# Patient Record
Sex: Male | Born: 1935
Health system: Southern US, Community
[De-identification: ages and names within clinical notes are randomized; demographics above are authoritative.]

## PROBLEM LIST (undated history)

## (undated) DIAGNOSIS — I2510 Atherosclerotic heart disease of native coronary artery without angina pectoris: Secondary | ICD-10-CM

## (undated) DIAGNOSIS — I509 Heart failure, unspecified: Secondary | ICD-10-CM

## (undated) DIAGNOSIS — N400 Enlarged prostate without lower urinary tract symptoms: Secondary | ICD-10-CM

## (undated) DIAGNOSIS — G4730 Sleep apnea, unspecified: Secondary | ICD-10-CM

## (undated) DIAGNOSIS — G529 Cranial nerve disorder, unspecified: Secondary | ICD-10-CM

## (undated) DIAGNOSIS — D649 Anemia, unspecified: Secondary | ICD-10-CM

## (undated) DIAGNOSIS — M1990 Unspecified osteoarthritis, unspecified site: Secondary | ICD-10-CM

## (undated) DIAGNOSIS — D696 Thrombocytopenia, unspecified: Secondary | ICD-10-CM

## (undated) DIAGNOSIS — E7800 Pure hypercholesterolemia, unspecified: Secondary | ICD-10-CM

## (undated) DIAGNOSIS — Z9289 Personal history of other medical treatment: Secondary | ICD-10-CM

## (undated) DIAGNOSIS — G629 Polyneuropathy, unspecified: Secondary | ICD-10-CM

## (undated) DIAGNOSIS — E669 Obesity, unspecified: Secondary | ICD-10-CM

## (undated) DIAGNOSIS — I10 Essential (primary) hypertension: Secondary | ICD-10-CM

## (undated) DIAGNOSIS — R0602 Shortness of breath: Secondary | ICD-10-CM

## (undated) DIAGNOSIS — K219 Gastro-esophageal reflux disease without esophagitis: Secondary | ICD-10-CM

## (undated) DIAGNOSIS — F419 Anxiety disorder, unspecified: Secondary | ICD-10-CM

## (undated) DIAGNOSIS — M159 Polyosteoarthritis, unspecified: Secondary | ICD-10-CM

## (undated) DIAGNOSIS — C801 Malignant (primary) neoplasm, unspecified: Secondary | ICD-10-CM

## (undated) HISTORY — DX: Essential (primary) hypertension: I10

## (undated) HISTORY — DX: Polyosteoarthritis, unspecified: M15.9

## (undated) HISTORY — DX: Polyneuropathy, unspecified: G62.9

## (undated) HISTORY — DX: Gastro-esophageal reflux disease without esophagitis: K21.9

## (undated) HISTORY — DX: Atherosclerotic heart disease of native coronary artery without angina pectoris: I25.10

## (undated) HISTORY — DX: Anemia, unspecified: D64.9

## (undated) HISTORY — PX: OTHER SURGICAL HISTORY: SHX169

## (undated) HISTORY — DX: Pure hypercholesterolemia, unspecified: E78.00

## (undated) HISTORY — DX: Sleep apnea, unspecified: G47.30

## (undated) HISTORY — PX: ARTERY BIOPSY: SHX891

## (undated) HISTORY — PX: TOTAL HIP ARTHROPLASTY: SHX124

## (undated) HISTORY — DX: Unspecified osteoarthritis, unspecified site: M19.90

## (undated) HISTORY — DX: Obesity, unspecified: E66.9

## (undated) HISTORY — DX: Benign prostatic hyperplasia without lower urinary tract symptoms: N40.0

## (undated) HISTORY — PX: HERNIA REPAIR: SHX51

## (undated) HISTORY — DX: Cranial nerve disorder, unspecified: G52.9

## (undated) HISTORY — DX: Shortness of breath: R06.02

## (undated) HISTORY — PX: JOINT REPLACEMENT: SHX530

## (undated) HISTORY — DX: Thrombocytopenia, unspecified: D69.6

## (undated) HISTORY — PX: APPENDECTOMY: SHX54

---

## 1998-06-26 ENCOUNTER — Ambulatory Visit (HOSPITAL_COMMUNITY): Admission: RE | Admit: 1998-06-26 | Discharge: 1998-06-26 | Payer: Self-pay | Admitting: Gastroenterology

## 1998-07-02 ENCOUNTER — Ambulatory Visit (HOSPITAL_COMMUNITY): Admission: RE | Admit: 1998-07-02 | Discharge: 1998-07-02 | Payer: Self-pay | Admitting: Cardiology

## 2002-10-14 ENCOUNTER — Ambulatory Visit (HOSPITAL_BASED_OUTPATIENT_CLINIC_OR_DEPARTMENT_OTHER): Admission: RE | Admit: 2002-10-14 | Discharge: 2002-10-14 | Payer: Self-pay | Admitting: Internal Medicine

## 2003-06-07 ENCOUNTER — Encounter: Payer: Self-pay | Admitting: Cardiology

## 2003-06-07 ENCOUNTER — Encounter: Admission: RE | Admit: 2003-06-07 | Discharge: 2003-06-07 | Payer: Self-pay | Admitting: Cardiology

## 2005-10-01 ENCOUNTER — Encounter: Admission: RE | Admit: 2005-10-01 | Discharge: 2005-10-01 | Payer: Self-pay | Admitting: Internal Medicine

## 2005-12-06 HISTORY — PX: CORONARY ARTERY BYPASS GRAFT: SHX141

## 2005-12-15 ENCOUNTER — Ambulatory Visit (HOSPITAL_COMMUNITY): Admission: RE | Admit: 2005-12-15 | Discharge: 2005-12-15 | Payer: Self-pay | Admitting: Cardiology

## 2005-12-15 HISTORY — PX: CARDIAC CATHETERIZATION: SHX172

## 2005-12-22 ENCOUNTER — Inpatient Hospital Stay (HOSPITAL_COMMUNITY): Admission: RE | Admit: 2005-12-22 | Discharge: 2005-12-27 | Payer: Self-pay | Admitting: Cardiothoracic Surgery

## 2006-01-20 ENCOUNTER — Encounter (HOSPITAL_COMMUNITY): Admission: RE | Admit: 2006-01-20 | Discharge: 2006-04-20 | Payer: Self-pay | Admitting: Cardiology

## 2006-06-20 IMAGING — CR DG CHEST 2V
2 series · 2 of 2 positions shown · non-contrast
Comparison: none

CLINICAL DATA: CABG.  Hypertension. 
 CHEST ? 2 VIEW:

[view not recorded (1 of 2)]
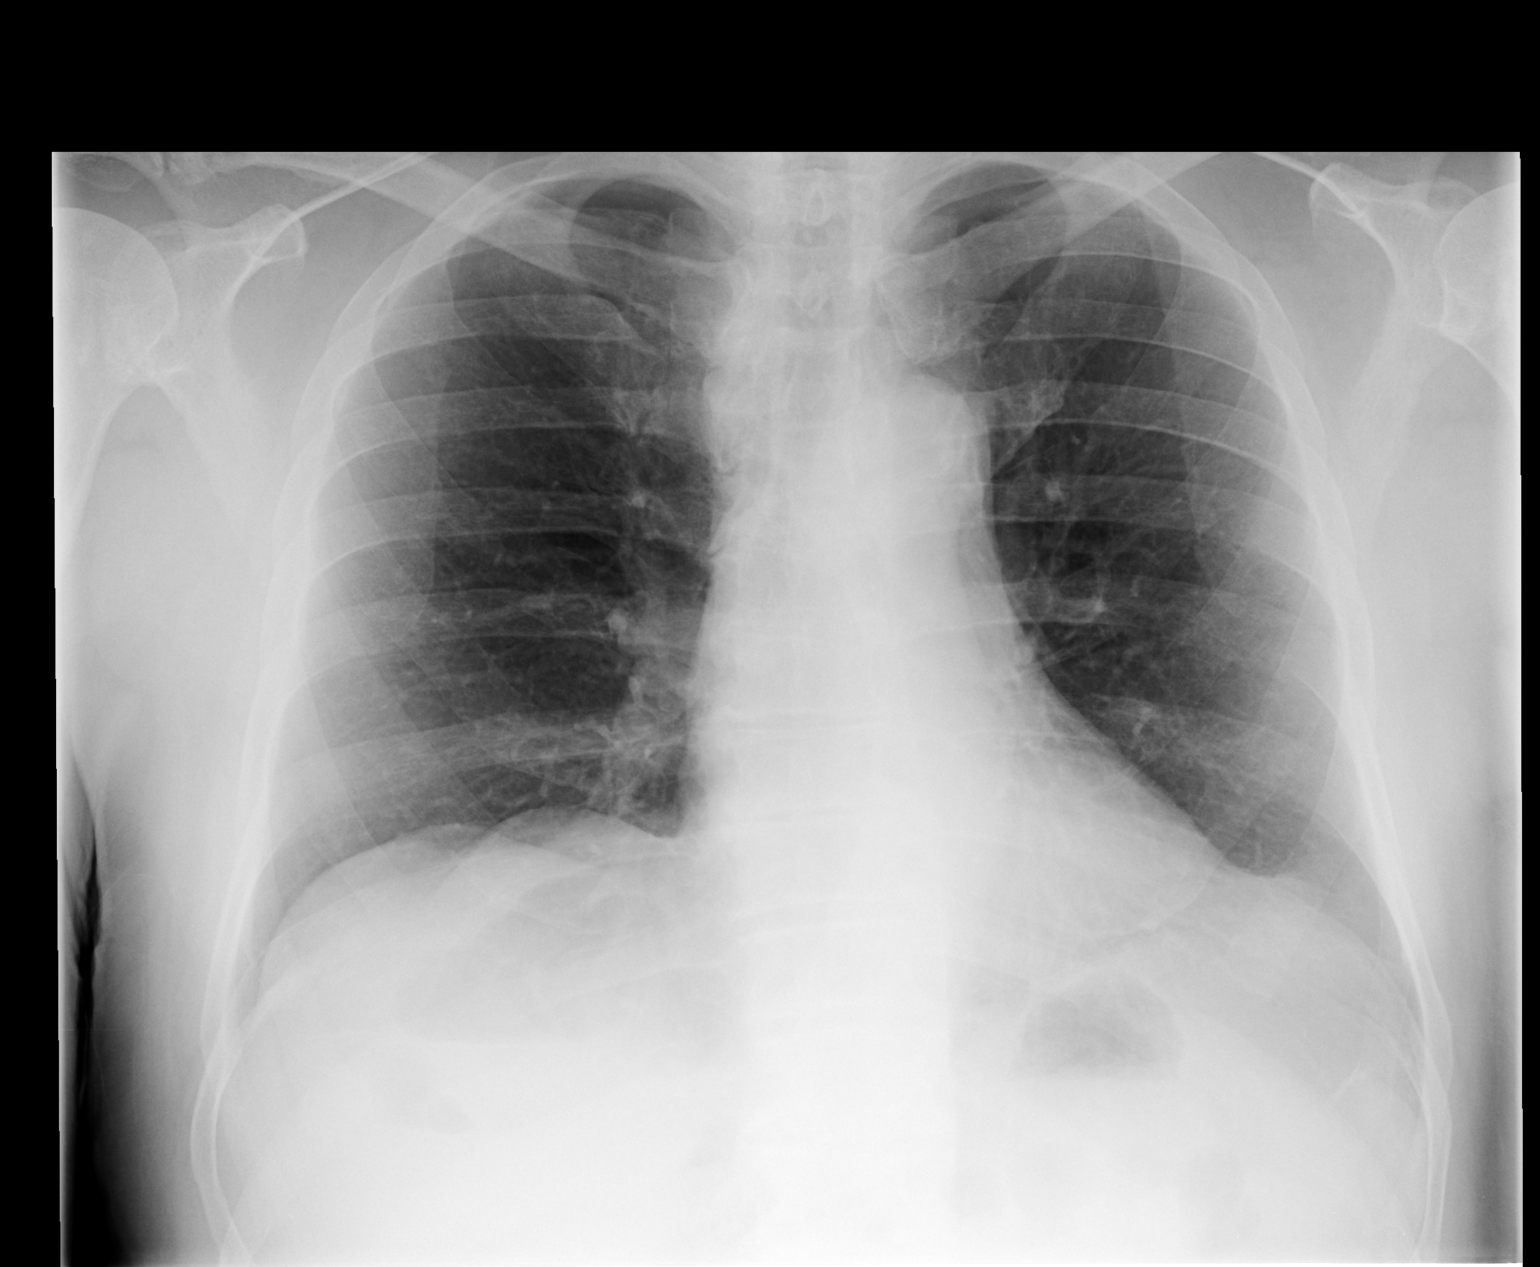

[view not recorded (2 of 2)]
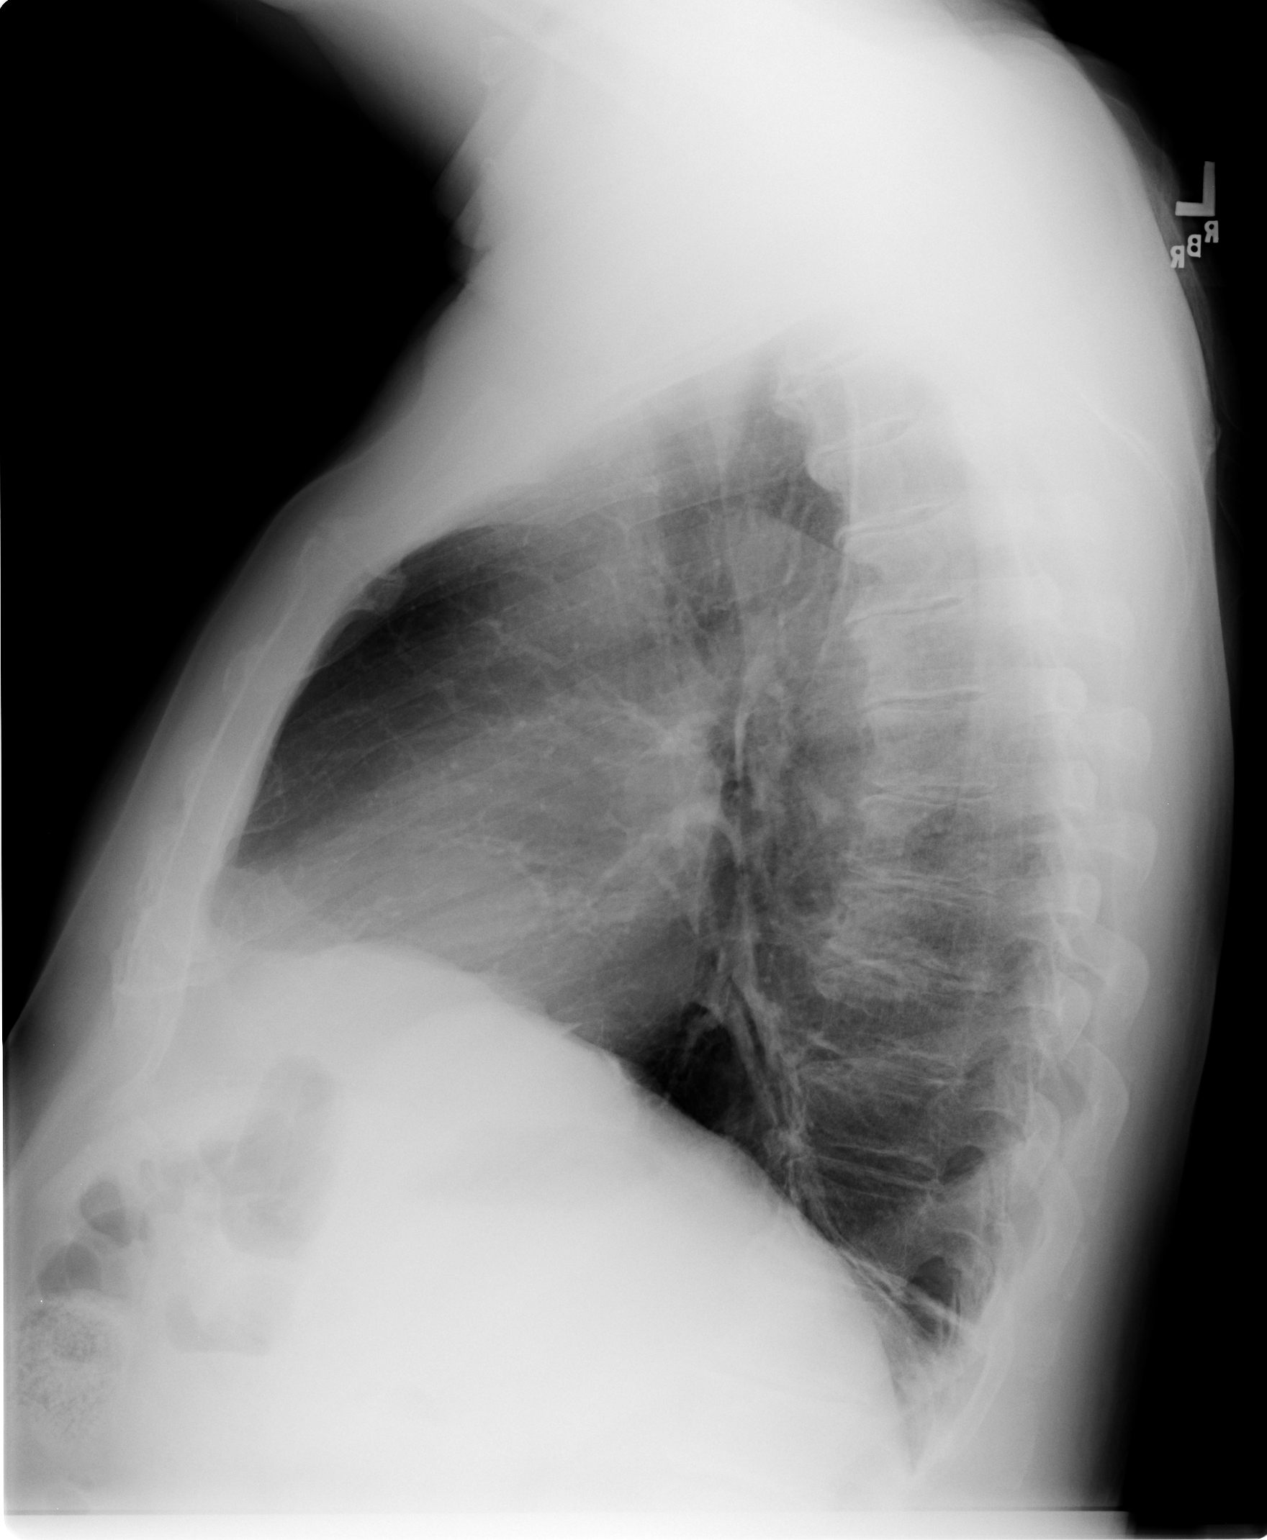

[2 of 2 positions shown; findings below may reference images not displayed]

FINDINGS: Two views of the chest show the lungs to be clear.  The heart is within normal limits of normal.  The linear scarring or atelectasis is noted posteriorly at the lung base.  There are degenerative changes throughout the thoracic spine.
IMPRESSION: No active lung disease.

## 2006-06-22 IMAGING — CR DG CHEST 1V PORT
1 series · 1 of 1 positions shown · non-contrast
Comparison: 12/20/05.

CLINICAL DATA: Post CABG.  Chest tube placement.  
 PORTABLE CHEST - 1 VIEW 12/22/05:

[view not recorded]
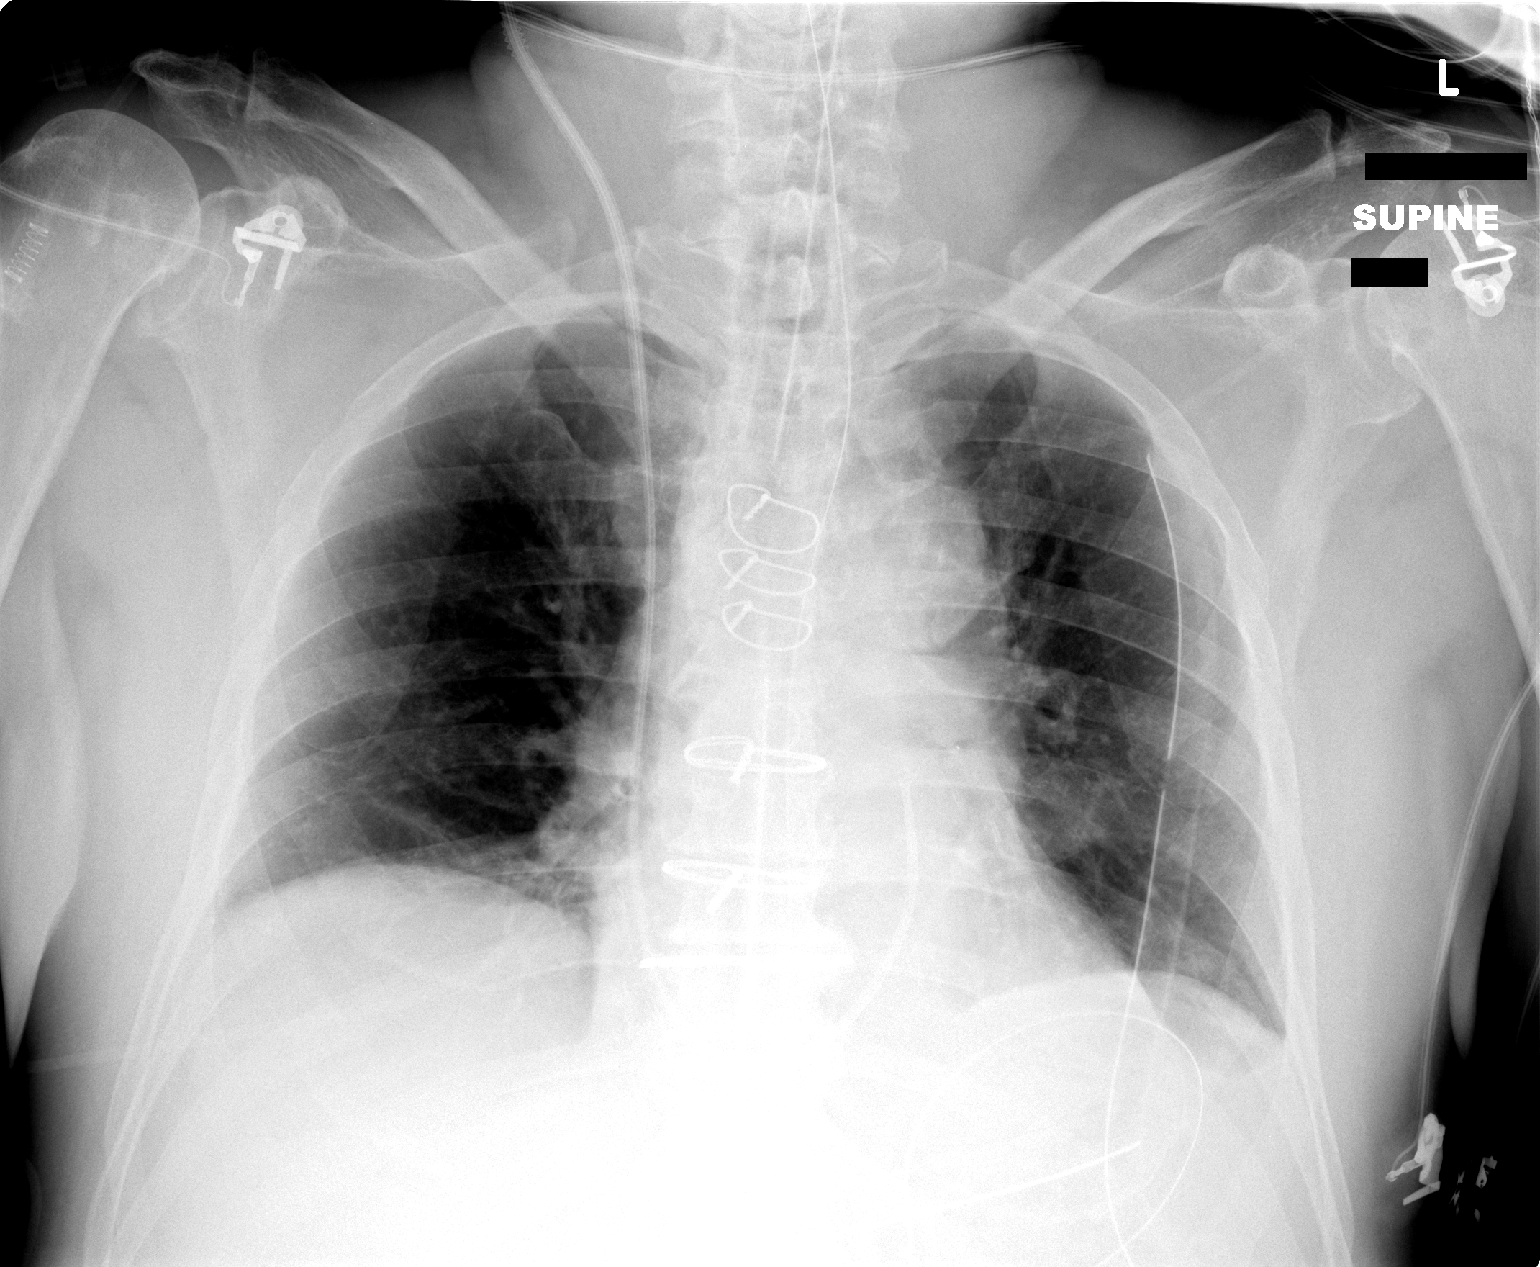

[1 of 1 positions shown; findings below may reference images not displayed]

FINDINGS: Endotracheal tube is in satisfactory position.  Nasogastric tube is followed into the stomach.  Right IJ Swan Ganz catheter terminates in the main pulmonary artery.  Mediastinal drain and left chest tube are in place. 
 No pneumothorax.  Lungs are low in volume with bibasilar subsegmental atelectasis.  No edema.
IMPRESSION: 1.  No pneumothorax with left chest tube in place. 
 2.  Low volume with bibasilar atelectasis.

## 2006-10-10 ENCOUNTER — Ambulatory Visit: Payer: Self-pay | Admitting: Internal Medicine

## 2006-12-07 ENCOUNTER — Inpatient Hospital Stay (HOSPITAL_COMMUNITY): Admission: RE | Admit: 2006-12-07 | Discharge: 2006-12-11 | Payer: Self-pay | Admitting: Orthopedic Surgery

## 2008-01-06 ENCOUNTER — Encounter: Payer: Self-pay | Admitting: Internal Medicine

## 2008-01-06 DIAGNOSIS — G4733 Obstructive sleep apnea (adult) (pediatric): Secondary | ICD-10-CM | POA: Insufficient documentation

## 2008-01-06 DIAGNOSIS — G4700 Insomnia, unspecified: Secondary | ICD-10-CM | POA: Insufficient documentation

## 2008-01-06 DIAGNOSIS — I2510 Atherosclerotic heart disease of native coronary artery without angina pectoris: Secondary | ICD-10-CM | POA: Insufficient documentation

## 2008-01-06 DIAGNOSIS — I10 Essential (primary) hypertension: Secondary | ICD-10-CM | POA: Insufficient documentation

## 2008-01-07 ENCOUNTER — Encounter: Admission: RE | Admit: 2008-01-07 | Discharge: 2008-01-07 | Payer: Self-pay | Admitting: Cardiology

## 2009-08-05 ENCOUNTER — Encounter: Payer: Self-pay | Admitting: Internal Medicine

## 2010-10-22 ENCOUNTER — Ambulatory Visit: Payer: Self-pay | Admitting: Cardiology

## 2011-04-16 ENCOUNTER — Other Ambulatory Visit: Payer: Self-pay | Admitting: Internal Medicine

## 2011-04-16 NOTE — Telephone Encounter (Signed)
Please advise if okay to refill. 

## 2011-04-19 NOTE — Telephone Encounter (Signed)
OK to refill halcion till next appointment or total 6 months, whichever is shorter.

## 2011-04-23 NOTE — Assessment & Plan Note (Signed)
Scott [name]                               PULMONARY OFFICE NOTE   Scott [name], Scott [name]                      MRN:          [id number]  DATE:10/10/2006                            DOB:          [birth year]    PULMONARY/SLEEP FOLLOWUP:   PROBLEMS:  1. Obstructive sleep apnea with insomnia.  2. Probable additional primary disorder of insomnia.  3. Hypertension.  4. Coronary disease/bypass graft.  5. Hip pain.   HISTORY:  Scott [name] was last seen at [name] in January,  2005.  He continues using CPAP but says he will decide kind of arbitrarily  to wear it some nights and not to wear it other nights.  Wife says he snores  loudly when he does not wear it.  He says he is just a restless sleeper,  kicking and moving around in the bed, and it gets in his way.  It sounds as  if he may have restless legs with the primary component being sleep-related  leg jerks.  He does not have a lot of daytime symptoms.  He has been having  some right hip and knee pain and will be seeing Scott [name] about this.  He  is not sure if some of the pain is related to vein graft removal from his  leg for use with coronary artery bypass graft surgery.  What he really wants  is a refill of triazolam, which he uses p.r.n.  He says if he stays on it  for 10-12 days at a time, he begins to note some decrease in his memory, so  he will come off it periodically.  We discussed this medication,  alternatives, and sleep hygiene carefully.  He does notice some nasal  stuffiness and dry mouth, although he uses a humidifier attachment with the  CPAP.  He gets up at night to walk around so he can relax his legs.  I  cannot tell quite how fragmented his sleep is.   MEDICATIONS:  1. Proscar 5 mg.  2. Flomax 5 mg.  3. Maxzide 1/2.  4. Mevacor.  5. Norvasc 2.5 mg.  6. CPAP 7 CWP.  7. Flonase.  8. Triazolam 1/2 to 1 time 0.25 mg.  9. Viagra.  10.Nasal saline spray.   Drug intolerant of CODEINE and PREDNISONE.   A sleep study in 2003 had shown mild obstructive apnea with an index of 15.9  per hour, moderate snoring.  Oxygen saturation desaturation to 82%, and CPAP  titration to 8 CWP.  He had been tried with clonazepam, Prosom, and I think  some other sleep meds before settling on triazolam.  He had seen Scott [name] in 2003 for acute sinusitis, nasal septal deviation, and his sleep  apnea.   OBJECTIVE:  VITAL SIGNS:  Weight 186 pounds.  BP 122/66.  Pulse regular at  85.  Room air saturation 95%.  GENERAL:  He is an alert, fully oriented man with minimal nasal congestion.  HEART:  Heart sounds are regular without murmur.  LUNGS:  Lung sounds are clear.  HEENT:  Voice  quality is normal with no stridor.  He is not obviously  restless during our discussion.   He has had a longstanding insomnia complaint for which we re-emphasized good  sleep hygiene and available tools.  I discussed potential relation to his  obstructive sleep apnea and to his restless legs syndrome as well as the hip  pain for which he is going to be seeing Scott [name].   PLAN:  1. Refill triazolam 0.25 mg nightly p.r.n. occasional use.  2. Samples of Lunesta 3 mg to use as an alternative, 1 nightly p.r.n.  3. Samples of Requip 0.25 mg for trial for restless legs.  4. Nasal saline gel.  5. Hopefully with medication changes above, he will be better able to      sleep and keep his CPAP on.  6. Return in four months, earlier p.r.n.     Scott D. Maple Hudson, MD, Scott [name], FACP  Electronically Signed    CDY/MedQ  DD: 10/10/2006  DT: 10/11/2006  Job #: [id number]   cc:   Scott [name], M.D.  Scott [name], M.D.  Scott [name], M.D.

## 2011-04-23 NOTE — Cardiovascular Report (Signed)
NAME:  Scott, [name] NO.:  [id number]   MEDICAL RECORD NO.:  [id number]          PATIENT TYPE:  OIB   LOCATION:  2854                         FACILITY:  [facility]   PHYSICIAN:  Colleen Can. Deborah Chalk, M.D.DATE OF BIRTH:  [birth year]   DATE OF PROCEDURE:  12/15/2005  DATE OF DISCHARGE:  12/15/2005                              CARDIAC CATHETERIZATION   HISTORY:  Scott [name] is a 77 year old male with a long history of ischemic  heart disease who had an abnormal exercise stress test on November 22, 2005.  He was referred for follow-up study.   PROCEDURE:  Left heart catheterization with selective coronary angiography,  left ventricular angiography.   TYPE AND SITE OF ENTRY:  Percutaneous right femoral artery with AngioSeal.   CATHETERS:  A 6-French 4 curved Judkins right and left coronary  catheterization, 6-French pigtail ventriculographic catheter.   CONTRAST MATERIAL:  Omnipaque.   MEDICATIONS:  Given prior procedure, Benadryl and Pepcid as well as Valium  10 milligrams.   MEDICATIONS:  Given during the procedure, Versed 4 milligrams IV.   COMMENT:  The patient tolerated the procedure well.   ANGIOGRAPHIC DATA:  1.  Left main coronary artery had 60-70% distal narrowing.  2.  Left circumflex:  Left circumflex continues primarily as a large obtuse      marginal system. There is 50-70% narrowing at the ostium of the left      circumflex. The left circumflex is highly calcified. There is a large      obtuse marginal vessel extending pressure lateral wall.  3.  Left anterior descending:  The left anterior descending is a reasonably      large vessel that crosses the apex. There is a large septal perforating      branch proximally. The vessel was heavily calcified. At the level of the      largest of the diagonal vessels, there appears to be a 60-70% stenosis      in the mid-section. In RAO cranial views, the left anterior descending      is heavily calcified. There  is collaterals to a distal posterior lateral      branch.  4.  Right coronary artery:  Right coronary artery is a small heavily      calcified and diffusely diseased dominant vessel. There is segmental 70-      90% stenosis throughout the proximal right coronary artery. There is a      posterior lateral branch that is patent and would be suitable for bypass      grafting. There are two small vessels that arise and go to the posterior      portion of the inferior wall. There is a vessel that appears to be the      origin of the posterior descending that is totally occluded at the crux.      The right coronary artery is diffusely diseased distally but would      appear to probably be suitable for bypass grafting. It may be possible      to graft into the body of the right or into some  of the smaller LV      branches.   The left ventricular angiogram performed in RAO position. Overall cardiac  size and silhouette are normal. The global ejection fraction estimated be  60%. Regional wall motion appears to be normal.   OVERALL IMPRESSION:  60-70% distal left main stenosis with severe diffuse  disease in the right coronary artery with moderately severe ostial left  circumflex disease and moderately severe stenosis in the mid-left anterior  descending.   DISCUSSION:  In light of these findings, it is felt that Scott [name] would  be served with coronary artery bypass grafting for revascularization.      Colleen Can. Deborah Chalk, M.D.  Electronically Signed     SNT/MEDQ  D:  12/15/2005  T:  12/16/2005  Job:  [id number]   cc:   Valentina Lucks, M.D.   Sheliah Plane, MD  [address]  [place]  [place] [address]

## 2011-04-23 NOTE — Discharge Summary (Signed)
NAME:  RICHARDSON, KLEINOW NO.:  [id number]   MEDICAL RECORD NO.:  [id number]          PATIENT TYPE:  INP   LOCATION:  5023                         FACILITY:  [facility]   PHYSICIAN:  Dyke Brackett, M.D.    DATE OF BIRTH:  1928-05-06   DATE OF ADMISSION:  12/07/2006  DATE OF DISCHARGE:  12/11/2006                               DISCHARGE SUMMARY   ADMISSION DIAGNOSES:  1. End-stage osteoarthritis, right hip.  2. Coronary artery disease with history of coronary artery bypass      graft.  3. Hypertension.  4. Sleep apnea with use of CPAP.  5. Gastroesophageal reflux disease.  6. Possible restless leg syndrome.  7. Blind.   DISCHARGE DIAGNOSES:  1. End-stage osteoarthritis, right hip, status post right total hip      arthroplasty.  2. Postoperative tachycardia, now resolved.  3. Acute blood loss anemia secondary to surgery.  4. Urinary retention, now resolved.  5. Thrombocytopenia, possibly due to Lovenox.  6. Coronary artery disease with history of coronary artery bypass      graft.  7. Hypertension.  8. Sleep apnea with use of CPAP.  9. Gastroesophageal reflux disease.  10.Questionable restless leg syndrome.  11.Blind.   SURGICAL PROCEDURES:  On December 07, 2006, Mr. Sias underwent a right  total hip arthroplasty by Dr. Marcie Mowers, assisted by Rexene Edison  PA-C.  He had a DePuy pedicle 100 series acetabular cup size 58 mm with  an apex hole eliminator placed Pinnacle Marathon acetabular liner plus  4, 10 degree, 36 mm inner diameter, 58 mm outer diameter.  A Prodigy hip  stem with reduction screw 18 mm diameter small stature, right, with an  articulate metal-on-metal femoral head, 36 mm +5 neck length, 12/14  cone.   COMPLICATIONS:  None.   CONSULTANT:  Cardiology consult by Dr. Roger Shelter December 08, 2006,  in addition to a physical therapy, occupational therapy and case  management consult.  Pharmacy consult for Coumadin therapy December 10, 2006.   HISTORY OF PRESENT ILLNESS:  This 77 year old white male patient  presented to Dr. Madelon Lips with history of right hip pain for the last 10  years.  It had gotten worse over the last 3 months.  The pain is now  present in the groin with radiation up and down his leg.  It awakens him  at night.  The hip does feel like it gives way.  He has difficulty  putting on his socks and shoes.  He has failed conservative treatment,  and x-rays show end-stage arthritic changes of the hip.  Because of  this, he is presenting for a right hip replacement.   HOSPITAL COURSE:  Mr. Correa tolerated surgical procedure well without  immediate postoperative complications.  He was transferred to 5000.  Postop day #1, he was having some mild nausea.  Platelets had decreased  to 108. Heart rate was elevated at 120.  I and O were monitored.  Cardiology consult was obtained due to his cardiac history and the  tachycardia Dr. Deborah Chalk followed him throughout his hospitalization.  Labs were  monitored.  He did have some difficulty voiding that day but  refused replacement of the Foley, and that was monitored.   On postop day #2, he was afebrile, and vital signs were stable.  Pulse  had dropped down into the 90s.  Platelets had dropped dramatically to  89, hemoglobin 8.8, hematocrit 25.1.  Lovenox was held at that time. Hip  panel was checked which was negative.  He was continued on therapy per  protocol. Vital signs were monitored.  He was switched to p.o. pain  medications.  Hemoglobin and hematocrit were just monitored.   On postop day #3 day, hemoglobin had dropped to 8.3, hematocrit 23.8,  and platelets were still 92.  Blood pressure was 115/64, pulse 80.  He  was transfused with 2 units of packed red blood cells.  Lovenox was  held, and he was started on Coumadin for DVT prophylaxis. At that time,  Lovenox was basically completely discontinued.  He was continued on  therapy.   On January 6, he was  doing better.  He remained afebrile, vital signs  stable.  Platelets had improved to 109, hemoglobin 11, hematocrit 31.2.  Incision was well approximated with staples, and leg was neurovascularly  intact.  It was felt he was ready for discharge home and was discharged  home later that day.   DISCHARGE INSTRUCTIONS:   DIET:  He can resume his regular pre-hospitalization diet.   MEDICATIONS:  He may resume his pre-hospitalization medications.  These  include:  1. Proscar 5 mg p.o. q.a.m..  2. Zantac over-the-counter 1 tablet p.o. q.a.m.Marland Kitchen  3. Maxzide 75/50 mg 1/2 tablet p.o. q.a.m.Marland Kitchen  4. Norvasc 5 mg 1/2 tablet p.o. q.a.m..  5. Metoprolol 50 mg 1/2 tablet p.o. nightly.  6. Flomax 4 mg p.o. nightly.  7. Mevacor 20 mg p.o. nightly.  8. Halcion 0.25 mg 1/2 tablet p.o. nightly.  9. Multivitamin 1 tablet p.o. q.a.m.Marland Kitchen  10.Vitamin C and vitamin B 1 tablet p.o. q.a.m.Marland Kitchen  11.KCl 10 mEq 1 tablet p.o. q.a.m.Marland Kitchen  12.Stool softener 1 tablet p.o. p.r.n.Marland Kitchen  13.Vicodin one to two p.o. q.6 h p.r.n. for pain, not to take while on      other pain medications.   Additional medications at this time include:  1. Norco 5/325 mg 1-2 tablets p.o. q.4 h p.r.n. for pain, 60 with no      refill.  2. Robaxin 500 mg 1-2 tablets p.o. q.6 h p.r.n. for spasms, 45 with no      refill.  3. He is not to take any aspirin.  4. He is to take Coumadin as directed by the [facility]      pharmacy at 6:00 p.m. for 1 month after surgery.   ACTIVITY:  He can be partial weightbearing 50% or less on the right leg  with use of walker.  He is to have home health PT per [facility].  Please see the blue total hip discharge sheet for further  activity instructions.   WOUND CARE:  He may shower after no drainage from the wound for 2 days.  Please see the blue total hip discharge sheet for further wound care  instructions.  FOLLOWUP:  He needs to follow up with Dr. Madelon Lips in our office on  Tuesday,  December 20, 2006, and needs to call [phone] for that  appointment.   LABORATORY DATA:  Hemoglobin and hematocrit ranged from 15.7 and 46.3,  respectively, on December 02, 2006, to  a low of 8.3 and 23.8,  respectively,  on December 10, 2006, to 11 and 31.2, respectively,  on the  January 6.  White count remained within normal limits.  Platelets went  from 140 on December 02, 2006, to a low of 89 on January 4, to 109 on  January 6.  PT and INR on January 6 were 29.49 and 2.6, respectively.   Sodium dropped to low of 134 on the January 5, potassium to a low of 3.1  on January 6.  Glucose ranged from 92 on December 28 to a high of 193 on  January 3 to 111 on January 6.   On December 28, total bilirubin was 1.8.  All other laboratory studies  were within normal limits.      Legrand Pitts Duffy, Arnetha Courser, M.D.  Electronically Signed    KED/MEDQ  D:  02/01/2007  T:  02/01/2007  Job:  [id number]   cc:   Colleen Can. Deborah Chalk, M.D.

## 2011-04-23 NOTE — Discharge Summary (Signed)
NAME:  Scott [name], Scott [name] NO.:  [id number]   MEDICAL RECORD NO.:  [id number]          PATIENT TYPE:  INP   LOCATION:  2012                         FACILITY:  [facility]   PHYSICIAN:  Sheliah Plane, MD    DATE OF BIRTH:  09/10/1928   DATE OF ADMISSION:  12/22/2005  DATE OF DISCHARGE:  12/27/2005                                 DISCHARGE SUMMARY   PRIMARY DISCHARGE DIAGNOSIS:  Coronary occlusive disease.   IN-HOSPITAL DIAGNOSES:  1.  Postoperative atrial fibrillation.  2.  Acute blood loss anemia postoperatively.  3.  Hypokalemia.   SECONDARY DIAGNOSES:  1.  History of insomnia.  2.  Question of obstructive sleep apnea, uses continuous positive airway      pressure at home.  3.  Hyperlipidemia.  4.  Hypertension.  5.  History of a duodenal ulcer in 1968.  6.  Allergic rhinitis.  7.  Benign prostatic hyperplasia.  8.  Episode of paroxysmal atrial fibrillation in 1987.  9.  Status post left knee arthroscopic surgery in 2003.  10. Status post bilateral carpal tunnel release.  11. Status post appendectomy.  12. Status post nasal surgery for septal deviation.  13. Status post angioplasty of his left circumflex artery in 1987 and      angioplasty of his right coronary artery in 1988.   ALLERGIES:  ALLERGIC TO SHELLFISH, CODEINE, AND PREDNISONE.   OPERATIONS/PROCEDURES:  Coronary artery bypass grafting x4 using left  internal mammary artery to left anterior descending coronary artery, reverse  saphenous vein graft to first obtuse marginal coronary artery, sequential  reverse saphenous vein graft to second obtuse marginal coronary artery,  reverse saphenous vein graft sequentially to the acute marginal and the  posterolateral branches of the right coronary artery, and right endo vein  harvesting is done.   HISTORY AND PHYSICAL/HOSPITAL COURSE:  The patient is a 77 year old male  with a history of coronary artery disease status post angioplasty to his  left  circumflex in 1987, angioplasty to his RCA in 1988.  The patient  presented to Dr. Ronnald Nian office with increasing fatigue and mild shortness  of breath.  Cardiolite stress test was done which suggested ischemia.  The  patient was then taken for cardiac catheterization and was seen to have 70%  distal left main obstruction.  There was disease in a large obtuse marginal  coronary artery and diffuse disease throughout the RCA including distal  total obstruction, collateral filling of the distal posterior descending,  and total occlusion of the posterior descending collaterals, and filling of  the posterolateral branch which was small.  Following cardiac  catheterization, Dr. Tyrone Sage was consulted.  The patient was seen and  evaluated by Dr. Tyrone Sage.  Dr. Tyrone Sage discussed with the patient coronary  artery bypass grafting.  He discussed the risks and benefits of the  procedure with the patient.  The patient acknowledges understanding and  wishes to proceed.  The patient was discharged to home following  catheterization which was done December 15, 2005.  The patient underwent  bilateral carotid duplex ultrasound preoperatively which showed no  significant IC stenosis.  The patient was taken to the operating room December 22, 2005, where he  underwent coronary artery bypass grafting x4 using a left internal mammary  artery to left anterior descending coronary artery, reverse saphenous vein  graft to the first obtuse marginal coronary artery, sequential reverse  saphenous vein graft to second obtuse marginal coronary artery, saphenous  vein graft sequentially to the acute marginal posterolateral branches of the  RCA.  Right EVH was done.  The patient tolerated the procedure well and was  transferred to the intensive care unit in stable condition.  Following  surgery, the patient seemed to be hemodynamically stable.  The patient was  extubated the evening of surgery.  Neurologic was seen to be  intact.  The  patient's postoperative course was complicated by postoperative atrial  fibrillation.  This occurred on postoperative day three.  The patient was  started on a prednisone drip and he was able to convert to normal sinus  rhythm shortly after.  The patient remained in normal sinus rhythm for 24  hours and Cardizem drip was discontinued and the patient was started on p.o.  Cardizem.  The patient was able to remain in normal sinus rhythm during the  remainder of his hospital stay.  The patient also developed acute blood loss  anemia following surgery.  He was asymptomatic from this.  This was  monitored during his hospital stay and continued to improve prior to  discharge home.  The patient's chest tubes and lines were discontinued in  the normal fashion.  He was transferred out to [facility] on postoperative day  two.  The patient was out of bed ambulating well.  Incisions were dry,  intact, and healing well.  The patient was able to be weaned off oxygen  saturating greater than 90% on room air.  The patient also developed volume  overload postoperatively.  He was started on diuretic and continued this  during his hospital stay and at discharge.  Last weight noted was 193.6 and  preoperative weight noted to be 184.  The patient remained afebrile during  his hospital stay.  Blood pressure was well-controlled.  The patient  ambulated well postoperatively.  He was encouraged to use his incentive  spirometer and chest x-ray prior to discharge was stable.   The patient was tentatively ready for discharge home on postoperative day 5-  6, January 22 through December 28, 2005.  Follow-up appointment will be  scheduled with Dr. Tyrone Sage in three weeks.  The patient will need to  contact Dr. Ronnald Nian office to schedule an appointment with him in two  weeks.  The patient will obtain a PA and lateral chest x-ray at that appointment and bring with him to Dr. Ronnald Nian office.  Our office will   contact the patient with his appointment with Dr. Tyrone Sage.  Mr. Sinner  received instructions on diet, activity level, and incisional care.  He was  told no driving over the next few days, no heavy lifting over 10 pounds.  The patient was told to ambulate 3-4 times per day and progress as  tolerated.  He was told to continue his breathing exercises.  The patient  was told he may shower washing his incisions using soap and water.  He is to  contact our office with any drainage or opening from any of his incision  sites or fever greater than 101.0  degrees Fahrenheit.  The patient was  educated on diet to be low-fat, low-salt.   DISCHARGE MEDICATIONS:  1.  Aspirin 325 mg daily.  2.  Lopressor 25 mg b.i.d.  3.  Mevacor 20 mg q.h.s.  4.  Halcion as used at home.  5.  Zantac as used at home.  6.  Proscar 5 mg daily.  7.  Flomax 0.4 mg at night.  8.  Lasix 20 mg daily for 5 days.  9.  Potassium chloride 10 mEq daily for 5 days.  10. Maxzide 75/50 daily.  11. Cardizem CD 120 mg daily.  12. Oxycodone 5 mg, 1-2 tabs p.o. q.4-6h. p.r.n. pain.      Theda Belfast, [place]      Sheliah Plane, MD  Electronically Signed    KMD/MEDQ  D:  12/26/2005  T:  12/27/2005  Job:  [id number]   cc:   Colleen Can. Deborah Chalk, M.D.  Fax: [phone]

## 2011-04-23 NOTE — Op Note (Signed)
NAME:  Scott [name], Scott [name] NO.:  [id number]   MEDICAL RECORD NO.:  [id number]          PATIENT TYPE:  INP   LOCATION:  2012                         FACILITY:  [facility]   PHYSICIAN:  Sheliah Plane, MD    DATE OF BIRTH:  1928/02/08   DATE OF PROCEDURE:  12/22/2005  DATE OF DISCHARGE:                                 OPERATIVE REPORT   PREOPERATIVE DIAGNOSIS:  Coronary occlusive disease.   POSTOPERATIVE DIAGNOSIS:  Coronary occlusive disease.   SURGICAL PROCEDURE:  Coronary artery bypass grafting x 4 with the left  internal mammary to the left anterior descending coronary artery, reversed  saphenous vein graft to the first obtuse marginal coronary artery,  sequential reversed saphenous vein graft to the second obtuse marginal  coronary artery, reversed saphenous vein graft sequentially to the acute  marginal and the posterolateral branches of the right coronary artery, with  right endovein harvesting.   SURGEON:  Gwenith Daily. Tyrone Sage, M.D.   FIRST ASSISTANT:  Pecola Leisure, P.A.-C.   BRIEF HISTORY:  The patient is a 77 year old male who, because of increasing  fatigue and mild shortness of breath and dyspnea on exertion, saw Dr.  Deborah Chalk who performed a Cardiolite stress test which was suggestive of  ischemia leading to cardiac catheterization.  At the time of  catheterization, the patient was found to have 70% distal left main  obstruction, disease in a large obtuse marginal coronary artery, and diffuse  disease throughout the right coronary system including distal total  obstruction with collateral filling of the distal posterior descending and  total occlusion of the posterior descending and collateral filling of the  posterolateral branch which was also small.  Because of the patient's  significant coronary disease, particularly left main obstruction, coronary  artery bypass grafting was recommended to the patient who agreed and signed  informed consent.   DESCRIPTION OF PROCEDURE:  With Swan-Ganz and arterial line monitors in  place, the patient underwent general endotracheal anesthesia without  incident.  The skin of the chest and legs were prepped with Betadine and  draped in the usual sterile manner.  Using the Guidant endovein harvesting  system, vein was harvested from the right thigh and was of good quality and  caliber.  A median sternotomy was performed, the left internal mammary  artery was dissected down as a pedicle graft.  The distal artery was  divided, had good free flow.  The pericardium was opened.  Overall  ventricular function appeared preserved.  He was systemically heparinized.  The ascending aorta and right atrium were cannulated and aortic root vent.  Cardioplegia was introduced into the ascending aorta.  The patient was  placed on cardiopulmonary bypass at 2.4 liters per minute per meter squared.  The body temp was cooled to 30 degrees.  The aortic Cross clamp was applied.  500 mL of cold blood potassium cardioplegia was administered with rapid  diastolic arrest of the heart.  The myocardial septal temperature was  monitored throughout the crossclamp.   Attention was turned first to a large obtuse marginal coronary artery which  was a dominant vessel of  the lateral wall.  This was opened and admitted a  1.5 mm probe.  The vessel, itself, was almost 2 mm in size.  Using a running  7-0 Prolene, distal anastomosis was performed with a segment of reversed  saphenous vein graft.  Attention was turned to the PD and PL.  The PD was  very diffusely diseased and had no obvious lumen and was very small.  The  posterolateral was also small but appeared to have a bypassable lumen.  The  vessel was opened and admitted a 1 mm probe distally.  The acute marginal  vessel was obvious and appeared reasonable in size, was opened, and was  larger than the posterolateral.  Using a diamond type side-to-side  anastomosis was carried out,  first with the acute marginal, the distal  extent.  The same vein was then carried to the posterolateral branch of the  right coronary artery and a distal anastomosis was performed with a running  7-0 Prolene.  Additional cold blood cardioplegia was administered down the  vein grafts.  Attention was turned to the left anterior descending coronary  artery which was opened in mid to distal third and admitted a 1.5 mm probe  distally.  Using a running 8-0 Prolene, the left internal mammary artery was  anastomosed to the left anterior descending  coronary artery.  With release  of Edwards bulldog on the mammary artery, there was a rise in the myocardial  septal temperature.  A bulldog was placed with the aortic cross clamp still  in place.  Two punch aortotomies were performed.  Each of the two vein  grafts were anastomosed to the ascending aorta.  Air was evacuated from the  ascending aorta and grafts and aortic cross clamp was removed.  The patient  spontaneously converted to a sinus rhythm.  The sites of anastomoses were  inspected and were free of bleeding.  The patient was then ventilated and  weaned from cardiopulmonary bypass without difficulty.  He remained  hemodynamically stable.  He was decannulated in the usual fashion.  Protamine sulfate was administered.  With the operative field hemostatic,  two atrial and two ventricular pacing wires were applied.  Graft markers  were applied.  A left pleural tube and two mediastinal tubes were left in  place.  The sternum was closed with #6 stainless steel wire, fascia was  closed with interrupted 0 Vicryl, running 3-0 Vicryl in the subcutaneous  tissue, and 4-0 subcuticular stitch in the skin edges.  Dry dressings were  applied.  Sponge and needle counts were reported as correct at the  completion of the procedure.  The patient tolerated the procedure without obvious complications and was transferred to the surgical intensive care  unit for  further postoperative care.      Sheliah Plane, MD  Electronically Signed     EG/MEDQ  D:  12/24/2005  T:  12/24/2005  Job:  [id number]   cc:   Colleen Can. Deborah Chalk, M.D.  Fax: [phone]

## 2011-04-23 NOTE — H&P (Signed)
NAME:  ANDREWJOHN, TOLLER NO.:  [id number]   MEDICAL RECORD NO.:  [id number]           PATIENT TYPE:   LOCATION:                                 FACILITY:   PHYSICIAN:  Sheliah Plane, MD    DATE OF BIRTH:  04/18/1928   DATE OF ADMISSION:  12/22/2005  DATE OF DISCHARGE:                                HISTORY & PHYSICAL   PRIMARY CARE PHYSICIAN:  Dr. Kirby Funk   CARDIOLOGIST:  Dr. Roger Shelter   CHIEF COMPLAINT:  Sluggish x4 months.   HISTORY OF PRESENT ILLNESS:  Mr. Hernandezgarci is a 77 year old Caucasian male  with known history of coronary artery disease status post angioplasty of his  left circumflex and right coronary artery in the late 1980s.  Approximately  four months ago he began having vague GI symptoms as well as fatigue and  mild dyspnea on exertion.  He was first seen by his primary physician, Dr.  Valentina Lucks, and ultimately had an upper GI study, although the results are  unknown.  Symptoms persisted and he ultimately relayed his symptoms to his  cardiologist, Dr. Deborah Chalk who recommended a stress Cardiolite study which  was abnormal.  He then underwent a cardiac catheterization by Dr. Deborah Chalk on  December 15, 2005 which showed 60-70% distal left main stenosis, severe  diffuse disease of the right coronary artery with moderate to severe ostial  left circumflex disease and moderate to severe mid left anterior descending  disease.  Cardiac surgery consultation was requested and the patient was  seen by Dr. Sheliah Plane who recommended coronary artery bypass surgery  as the best treatment option.  Surgery has tentatively been scheduled for  December 22, 2005.  Today, January 16 Mr. Garringer is at the [facility] office for  his preoperative history and physical.  He does not describe chest pain per  se, but does report an epigastric pain which occurs with exertion which is  non-radiating.  He also has noticed increasing shortness of breath over the  last few  months, but primarily with exertion.  He denies orthopnea or PND.  He has a rare cough and has occasional mild peripheral edema only with  prolonged sitting or standing.  He denies nocturia or TIA or stroke  symptoms.  He does have occasional reflux symptoms for which he is on  Zantac.  He also has occasional palpitations and does develop diaphoresis  with walking several blocks.  He denies any recent arrhythmias, but says he  was first seen by cardiologists in 1987 with new onset atrial fibrillation  which is now resolved.   PAST MEDICAL HISTORY:  1.  Coronary artery disease as discussed above.  2.  Insomnia.  3.  Question of obstructive sleep apnea, uses CPAP.  4.  Hyperlipidemia.  5.  Hypertension.  6.  History of duodenal ulcer 1968.  7.  Allergic rhinitis.  8.  Benign prostatic hyperplasia.  9.  Paroxysmal atrial fibrillation in 1987.   PAST SURGICAL HISTORY:  1.  Left knee arthroscopic surgery in 2003.  2.  Bilateral carpal tunnel release.  3.  Appendectomy.  4.  Nasal surgery for septal deviation.  5.  Angioplasty of his left circumflex artery in 1987 and angioplasty of his      right coronary artery in 1988.   ALLERGIES:  He is allergic to [facility] which causes a GI upset, CODEINE  which causes a rash, and with PREDNISONE he develops swelling if given more  than 5 mg at a time.   MEDICATIONS:  1.  Flomax 0.4 mg p.o. daily.  2.  Norvasc 5 mg p.o. daily.  3.  Maxzide 50/75 mg p.o. daily.  4.  Proscar 5 mg p.o. daily.  5.  Mevacor 20 mg p.o. daily.  6.  Halcion at bedtime p.r.n. for sleep (questionable 0.125 mg).  7.  Zantac OTC p.o. daily.  8.  Flonase p.r.n.   REVIEW OF SYSTEMS:  Please refer to history of present illness for pertinent  positives and negatives.  Per [facility] review of systems form he does have some  generalized joint stiffness.  He wears glasses and had one episode of double  vision four years ago, but none since.  He has problems with sinusitis and   neck stiffness.  He also develops occasional night sweats and has problems  with constipation.  He denies any hematochezia.   SOCIAL HISTORY:  He is married with two children.  He has never used tobacco  products and does not drink alcohol.  He is retired from [employer], but is currently working with [employer]eron[employer].  He lives in  [place].   FAMILY HISTORY:  His mother died at age 23 and his father at age 32, both  being involved in a traumatic accident.  He has one brother who is living  with prostate cancer.  He has another brother who died at 36 from prostate  cancer and a sister who died at age 44 from a GI cancer.   PHYSICAL EXAMINATION:  VITAL SIGNS:  Blood pressure 130/70, heart rate 74,  respirations 18.  GENERAL APPEARANCE:  This is a 77 year old Caucasian male who is alert,  cooperative, and in no acute distress.  HEENT:  His head is normocephalic and atraumatic.  He does wear glasses.  His sclerae are non-icteric and pupils are equal, round, and reactive to  light.  Oral mucosa is pink and moist.  No lesions __________.  There is no  lymphadenopathy or thyromegaly noted.  RESPIRATORY:  Lung sounds are clear throughout.  Breathing is unlabored and  symmetrical on inspiration.  CARDIAC:  Heart has a regular rate and rhythm.  No murmur, rub, or gallop  was noted.  ABDOMEN:  Soft, nontender, nondistended with hypoactive bowel sounds.  No  masses or hepatomegaly was appreciated.  Abdomen is mildly obese.  GENITOURINARY:  Deferred.  RECTAL:  Deferred.  EXTREMITIES:  Warm and dry and well perfused.  He has no edema.  He has 2+  radial and posterior tibial pulses bilaterally and 1+ dorsalis pedis pulses  bilaterally.  NEUROLOGIC:  Grossly intact.  He is alert and oriented x4.  Speech is clear.  His gait is __________.   ASSESSMENT:  Left main with severe three vessel coronary artery disease.  PLAN:  Mr. Chalupa will be electively admitted to [facility]  on  December 22, 2005 to undergo coronary artery bypass graft surgery by Dr.  Sheliah Plane.  Dr. Tyrone Sage has seen and examined Mr. Tanabe prior to  this admission and has explained the risks and benefits of the above  procedure and the  patient has agreed to proceed.      Jerold Coombe, P.A.      Sheliah Plane, MD  Electronically Signed    AWZ/MEDQ  D:  12/21/2005  T:  12/21/2005  Job:  [id number]   cc:   Colleen Can. Deborah Chalk, M.D.  Fax: [phone]   Thora Lance, M.D.  Fax: [phone]

## 2011-04-23 NOTE — H&P (Signed)
NAME:  Scott [name], LAFARY NO.:  [id number]   MEDICAL RECORD NO.:  [id number]          PATIENT TYPE:  INP   LOCATION:  NA                           FACILITY:  [facility]   PHYSICIAN:  Richardean Canal, P.A.    DATE OF BIRTH:  [birth year]   DATE OF ADMISSION:  DATE OF DISCHARGE:                              HISTORY & PHYSICAL   CHIEF COMPLAINT:  Right hip, evaluated.   HISTORY OF PRESENT ILLNESS:  Mr. Scott [name] is a 77 year old white male  with right hip pain for approximately 10 years.  Pain worse over the  past 3 months.  He has groin pain without radiation up and down his leg.  He does experience waking pain to his right groin pain.  He has giving  away sensation of the hip.  He has difficulty donning shoes and socks.  He has no injections into the right hip.   ALLERGIES:  1. INTOLERANCE TO CODEINE AS IT CAUSES NAUSEA.  2. ORAL PREDNISONE CAUSES SWELLING.  3. SHELLFISH CAUSES HIM TO HAVE HIVES AND CAUSES NAUSEA.   MEDICATIONS:  1. Proscar 5 mg once every morning.  2. Zantac over the counter 1 each morning.  3. Maxzide 75/50 mg 1/2 tablet each morning.  4. Norvasc 5 mg 1/2 tab each morning.  5. Metoprolol 50 mg 1/2 tablet morning and evening.  6. Flomax 40 mg once q.h.s.  7. Mevacor 20 mg 1 at night.  8. Halcion 0.25 1-1/2 tablets q.h.s.  9. Aspirin 81 mg 1 nightly, stopped November 30, 2006.  10.Vicodin 5/500 1 to 2 tablets every 4 to 6 hours for pain.   PAST MEDICAL HISTORY:  1. Coronary artery disease, history of CABG.  2. Hypertension.  3. Sleep apnea, CPAP use.  4. Insomnia.  5. GERD.   PAST SURGICAL HISTORY:  1. CABG, December 22, 2005.  2. Carpal tunnel, right hand, Dr. Madelon Lips 2005.  3. Left hand carpel tunnel release, Dr. Chaney Malling.  4. Left knee arthroscopy with removal loose bodies December 15, 2001,      Dr. Madelon Lips.   Patient denies any complications or blood transfusions with the above  procedures.   FAMILY HISTORY:  Positive for  hypertension.   SOCIAL HISTORY:  Patient denies any tobacco or alcohol use.  He is  married, lives in a 1-story residency with 2 steps to initial entrance.   REVIEW OF SYSTEMS:  The patient has not had any chest pain since his  CABG in 2007.  Has hypertension, denies diabetes.  Has sleep apnea with  CPAP use.  Has chronic constipation.  History of gastric ulcer.  Has  gastric reflux that is well-controlled.  Questionable restless leg  syndrome, otherwise review of systems negative and noncontributory.   PHYSICAL EXAMINATION:  GENERAL:  Patient is a well-developed, well-  nourished male in no acute distress.  He does walk with an antalgic  gait.  Patient's mood and affect are appropriate, talks easily with  examiner.  Patient's height is 5'6, weight is 176 pounds.  VITAL SIGNS:  Temperature 98.9, pulse 82, respiratory rate 18, blood  pressure 112/62.  CARDIAC:  Regular rate rhythm, no murmurs, rubs, or gallops noted.  LUNGS:  Clear to auscultation.  No wheezing, rhonchi, or rales.  Patient  does have a well-healed surgical scar midline of the chest.  ABDOMEN:  Soft, nontender, bowel sounds in 4 quadrants.  HEENT:  Head:  Normocephalic, atraumatic without frontal maxillary sinus  tenderness to palpation.  Conjunctiva is pink, sclera nonicteric,  PERRLA, EOM are intact.  No visible external ear deformities.  TM pearly  and gray on the right, the left is obscured due to heavy cerumen.  Nose  and nasal septum midline, nasal mucosa pink and white without polyps.  Patient has good dentition.  Pharynx without erythema or exudate.  Tongue equal and midline.  NECK:  Trachea is midline, no lymphadenopathy, carotids 2+ bilaterally  without bruits.  No tenderness with palpation along the cervical spine.  He has full range of motion of the cervical spine.  BACK:  No tenderness to palpation or with rest.  Normal response.  BREAST:  Deferred at this time.  GU:  Deferred at this time.  RECTAL:   Deferred at this time.  NEURO:  Patient is alert and oriented x3.  Cranial nerves II-XII are  grossly intact.  Lower extremity strength testing reveals 5/5 strength  throughout.  Deep tendon reflexes are 2+ at the patellas and the ankles  bilaterally and are equal and symmetric.  MUSCULOSKELETAL:  He has full range of motion of the upper extremities,  this includes shoulders, elbows, wrists, hands.  Upper extremities are  equal and symmetric in size and shape.  Lower extremities:  Left hip  internal rotation is 20 degrees, external 30 degrees.  Right hip  internal rotation is 15 degrees, 20 degrees external and painful.  He  has flexion at 90 degrees bilaterally.  Bilateral knees without effusion  edema.  He has full extension in both knees and flexion beyond 90  degrees.  Lower extremities nonedematous.  Posterior tibial pulses are  2+ bilaterally.  X-RAYS:  X-rays of the right hip shows severe osteoarthritis.   IMPRESSION:  1. Osteoarthritis right hip.  2. Coronary artery disease with history of coronary artery bypass      graft.  3. Hypertension.  4. Sleep apnea with CPAP use.  5. Gastroesophageal reflux disease.  6. Questionable restless leg syndrome.  7. Blind.   PLAN:  Patient is to be admitted to [facility] on December 07, 2006 to undergo a right total hip arthroplasty by Dr. Madelon Lips.  Prior to  surgery patient is to undergo all preoperative labs and testing.  He did  receive clearance from Dr. Roger Shelter who is cardiologist and also  clearance from Dr. Jetty Duhamel of [facility].      Richardean Canal, P.A.     GC/MEDQ  D:  12/02/2006  T:  12/03/2006  Job:  [id number]   cc:   Dyke Brackett, M.D.

## 2011-04-23 NOTE — Discharge Summary (Signed)
NAME:  Scott, [name] NO.:  [id number]   MEDICAL RECORD NO.:  [id number]          PATIENT TYPE:  INP   LOCATION:  2012                         FACILITY:  [facility]   PHYSICIAN:  Sheliah Plane, MD    DATE OF BIRTH:  12-04-1928   DATE OF ADMISSION:  12/22/2005  DATE OF DISCHARGE:                                 DISCHARGE SUMMARY   PRIMARY DISCHARGE DIAGNOSIS:  Coronary occlusive disease.   IN-HOSPITAL DIAGNOSES:  1.  Postoperative atrial fibrillation.  2.  Acute blood loss anemia postoperatively.  3.  Hypokalemia.   SECONDARY DIAGNOSES:  1.  History of insomnia.  2.  Question of obstructive sleep apnea, uses continuous positive airway      pressure at home.  3.  Hyperlipidemia.  4.  Hypertension.  5.  History of a duodenal ulcer in 1968.  6.  Allergic rhinitis.  7.  Benign prostatic hyperplasia.  8.  Episode of paroxysmal atrial fibrillation in 1987.  9.  Status post left knee arthroscopic surgery in 2003.  10. Status post bilateral carpal tunnel release.  11. Status post appendectomy.  12. Status post nasal surgery for septal deviation.  13. Status post angioplasty of his left circumflex artery in 1987 and      angioplasty of his right coronary artery in 1988.   ALLERGIES:  ALLERGIC TO SHELLFISH, CODEINE, AND PREDNISONE.   IN-HOSPITAL OPERATIONS AND PROCEDURES:  1.  Coronary artery bypass grafting times four   Dictation ended at this point.      Theda Belfast, [place]      Sheliah Plane, MD     KMD/MEDQ  D:  12/26/2005  T:  12/27/2005  Job:  [id number]

## 2011-04-23 NOTE — H&P (Signed)
NAME:  Scott Scott [name], Scott Scott [name] NO.:  [id number]   MEDICAL RECORD NO.:  [id number]            PATIENT TYPE:   LOCATION:                                 FACILITY:   PHYSICIAN:  Colleen Can. Deborah Chalk, M.D.    DATE OF BIRTH:   DATE OF ADMISSION:  12/15/2005  DATE OF DISCHARGE:                                HISTORY & PHYSICAL   CHIEF COMPLAINT:  None.   HISTORY OF PRESENT ILLNESS:  Mr.  Scott [name] is a very pleasant 77 year old  male.  He has known history of ischemic heart disease with remote history of  angioplasty and presents for catheterization after having had abnormal  stress Cardiolite study.  He exercised under standard Bruce protocol for a  total of 9 minutes with good and stable exercise tolerance.  Blood pressure  response was adequate.  Clinically, he had no complaints; however, his EKG  showed ST depression at the peak of exercise inferolaterally.  Images showed  mild inferobasilar ischemia.  Ejection fraction was 62%.  Clinically, he has  no complaints of chest pain and remains active.   PAST MEDICAL HISTORY:  1.  Atherosclerotic cardiovascular disease.  He had angioplasty date back to      late 1980s of left circumflex (1610) and to the right coronary (1988).  2.  Insomnia.  3.  Cranial neuropathy.  4.  Hyperlipidemia.  5.  Hypertension.  6.  History of duodenal ulcer.   ALLERGIES:  None.   CURRENT MEDICATIONS:  1.  Norvasc 5 mg a day.  2.  Flomax daily.  3.  Proscar daily.  4.  Zantac 150 daily.  5.  Maxzide 75/50 one-half tablet daily.  6.  Mevacor 20 mg a day.   FAMILY HISTORY:  Unremarkable.   SOCIAL HISTORY:  There is no smoking or alcohol.  He is married.   REVIEW OF SYSTEMS:  Basically as noted above.  He is remaining active and  walking daily.  No previous episodes of chest pain.  He is not short of  breath.  He has had no lightheadedness, no dizziness, no edema.   PHYSICAL EXAMINATION:  GENERAL:  He is a very pleasant white male who  appears somewhat younger than his stated age.  VITAL SIGNS:  Blood pressure 150/90 sitting, 150/90 standing, heart rate 84  and regular, respirations 18.  He is afebrile.  Weight is 186 pounds.  SKIN:  Warm and dry.  Color is unremarkable.  HEENT:  Exam is unremarkable.  LUNGS: Clear.  HEART:  Regular rhythm.  ABDOMEN:  Soft.  Positive bowel sounds, nontender.  EXTREMITIES:  Without edema.  NEUROLOGIC:  Intact with no gross focal deficits.   LABORATORY DATA:  Pertinent labs are pending.   OVERALL IMPRESSION:  1.  Abnormal stress Cardiolite.  2.  Known ischemic heart disease.  3.  Hyperlipidemia.  4.  Hypertension.   PLAN:  We will proceed on with cardiac catheterization.  The procedure,  risks, and benefits have all been explained, and he is willing to proceed on  Wednesday, December 15, 2005.      Lawson Fiscal  Ellin Goodie, N.P.      Colleen Can. Deborah Chalk, M.D.  Electronically Signed    LC/MEDQ  D:  12/10/2005  T:  12/10/2005  Job:  [id number]   cc:   Thora Lance, M.D.  Fax: [phone]

## 2011-04-23 NOTE — Op Note (Signed)
NAME:  ADDEN, STROUT NO.:  [id number]   MEDICAL RECORD NO.:  [id number]          PATIENT TYPE:  INP   LOCATION:  5023                         FACILITY:  [facility]   PHYSICIAN:  Dyke Brackett, M.D.    DATE OF BIRTH:  November 10, 1928   DATE OF PROCEDURE:  12/07/2006  DATE OF DISCHARGE:                               OPERATIVE REPORT   PREOPERATIVE DIAGNOSIS:  Osteoarthritis, right hip.   POSTOPERATIVE DIAGNOSIS:  Osteoarthritis, right hip.   OPERATION:  Right total hip replacement (DePuy Small Stature 18-mm stem,  +5-mm neck length, 36-mm hip ball, with 58-mm acetabulum with Pinnacle  cup).   SURGEON:  Dyke Brackett, M.D.   ASSISTANT:  Lolita Cram, P.A.   ANESTHESIA:  General.   BLOOD LOSS:  Approximately 500.   DESCRIPTION OF PROCEDURE:  Sterile prep and drape in the lateral  position.  Posterior approach to the hip made.  Splitting of the  iliotibial band and the short external rotators.  Careful protection of  the sciatic nerve.  T capsulotomy was made in the hip.  The hip was  dislocated and about 1 fingerbreadth above the lesser troch, a  provisional neck cut was made, revised to improve the inclination angle  relative to the hip.  The hip was certainly severely arthritic.  The hip  was progressively reamed, then rasped up to accept a 17.5-mm broach for  an 18-mm stem.  The acetabulum was exposed.  Retractors were placed  anteriorly and posteriorly.  The acetabulum was reamed progressively up  to 57 mm to fit the 58-mm cup.  Good bleeding bone was encountered.  The  cup position was checked with an x-ray intraoperatively for the cup and  the femoral stem, which appeared to be satisfactory.  The final cup was  inserted with good stability and the appropriate amount of anteversion  and abduction, followed by the apex screw being placed, and the final  polyethylene Pinnacle cup.  This was followed by placing the femoral  stem with a +5-mm neck length.  Excellent  press-fit was obtained.  No  tendency to basically dislocate, good restoration of leg lengths.  Closure was effected by closing the T capsulotomy in the capsule with  interrupted Ethibond, running Ethibond on the iliotibial band, and 2-0  Vicryl and skin clips on the skin.  Taken to the recovery room in stable  condition.      Dyke Brackett, M.D.  Electronically Signed     WDC/MEDQ  D:  12/07/2006  T:  12/07/2006  Job:  [id number]

## 2011-05-13 ENCOUNTER — Encounter: Payer: Self-pay | Admitting: Cardiology

## 2011-05-14 ENCOUNTER — Other Ambulatory Visit: Payer: Self-pay | Admitting: *Deleted

## 2011-05-14 DIAGNOSIS — E785 Hyperlipidemia, unspecified: Secondary | ICD-10-CM

## 2011-05-17 ENCOUNTER — Encounter: Payer: Self-pay | Admitting: Cardiology

## 2011-05-17 ENCOUNTER — Other Ambulatory Visit (INDEPENDENT_AMBULATORY_CARE_PROVIDER_SITE_OTHER): Payer: BC Managed Care – PPO | Admitting: *Deleted

## 2011-05-17 ENCOUNTER — Ambulatory Visit (INDEPENDENT_AMBULATORY_CARE_PROVIDER_SITE_OTHER): Payer: BC Managed Care – PPO | Admitting: Cardiology

## 2011-05-17 DIAGNOSIS — E785 Hyperlipidemia, unspecified: Secondary | ICD-10-CM

## 2011-05-17 DIAGNOSIS — I2510 Atherosclerotic heart disease of native coronary artery without angina pectoris: Secondary | ICD-10-CM

## 2011-05-17 DIAGNOSIS — I10 Essential (primary) hypertension: Secondary | ICD-10-CM

## 2011-05-17 LAB — BASIC METABOLIC PANEL
BUN: 14 mg/dL (ref 6–23)
CO2: 32 mEq/L (ref 19–32)
Calcium: 9.2 mg/dL (ref 8.4–10.5)
Chloride: 102 mEq/L (ref 96–112)
Creatinine, Ser: 1.2 mg/dL (ref 0.4–1.5)
GFR: 63 mL/min (ref >60.00)
Potassium: 4 mEq/L (ref 3.5–5.1)
Sodium: 141 mEq/L (ref 135–145)

## 2011-05-17 LAB — HEPATIC FUNCTION PANEL
ALT: 18 U/L (ref 0–53)
AST: 19 U/L (ref 0–37)
Albumin: 4.2 g/dL (ref 3.5–5.2)
Total Bilirubin: 1.5 mg/dL — ABNORMAL HIGH (ref 0.3–1.2)
Total Protein: 6.8 g/dL (ref 6.0–8.3)

## 2011-05-17 LAB — LIPID PANEL
Cholesterol: 152 mg/dL (ref 0–200)
HDL: 40.3 mg/dL (ref >39.00)
LDL Cholesterol: 82 mg/dL (ref 0–99)
Total CHOL/HDL Ratio: 4
Triglycerides: 148 mg/dL (ref 0.0–149.0)
VLDL: 29.6 mg/dL (ref 0.0–40.0)

## 2011-05-17 NOTE — Assessment & Plan Note (Signed)
Blood pressure is well controlled on current medications. 

## 2011-05-17 NOTE — Patient Instructions (Signed)
Try and lose weight.  Get some regular walking.  We will see you back in 6 months with fasting lab work.

## 2011-05-17 NOTE — Assessment & Plan Note (Signed)
He is asymptomatic. His last Cardiolite study in December of 2010 was normal with ejection fraction of 77%. We will continue with risk factor modification, aspirin, and beta blocker therapy. I have encouraged him to try to walk a little bit more and to lose some weight.

## 2011-05-17 NOTE — Assessment & Plan Note (Signed)
His lipids have been under good control on lovastatin. We'll plan on checking fasting lab work on his followup in 6 months.

## 2011-05-17 NOTE — Progress Notes (Signed)
Scott [name] Date of Birth: 1928/02/24   History of Present Illness: Scott [name] is a pleasant 77 year old white male who on seeing for the first time. He is a former patient of Dr. Ronnald Nian. He has a history of coronary disease status post CABG in 2007. He continues to do very well. He does his own mowing and farming. He doesn't walk much because his knees bother him. He does complain of some discomfort in the left upper quadrant of his abdomen which has been there for a long time. He also has a history of sleep apnea but doesn't like to use his CPAP machine. He denies any significant chest pain or shortness of breath. He has had no palpitations.  Current Outpatient Prescriptions on File Prior to Visit  Medication Sig Dispense Refill  . amLODipine (NORVASC) 2.5 MG tablet Take 2.5 mg by mouth daily.        Marland Kitchen aspirin 81 MG tablet Take 81 mg by mouth daily.        Marland Kitchen doxazosin (CARDURA) 4 MG tablet Take 4 mg by mouth at bedtime.        . finasteride (PROSCAR) 5 MG tablet Take 5 mg by mouth daily.        . fish oil-omega-3 fatty acids 1000 MG capsule Take by mouth every other day.        . lovastatin (MEVACOR) 20 MG tablet Take 20 mg by mouth at bedtime.        . metoprolol tartrate (LOPRESSOR) 25 MG tablet Take 25 mg by mouth 2 (two) times daily.        Marland Kitchen triamterene-hydrochlorothiazide (MAXZIDE) 75-50 MG per tablet Take 1 tablet by mouth daily.        . triazolam (HALCION) 0.25 MG tablet TAKE ONE TABLET AT BEDTIME AS NEEDED FOR SLEEP  30 tablet  5    Allergies  Allergen Reactions  . Codeine   . Prednisone     Past Medical History  Diagnosis Date  . SOB (shortness of breath)   . Hypercholesterolemia   . GERD (gastroesophageal reflux disease)   . Sleep apnea   . Generalized OA   . Cranial neuropathy     resolved   . Hypertension   . CAD (coronary artery disease)   . Bronchitis   . BPH (benign prostatic hyperplasia)     Past Surgical History  Procedure Date  . Cardiac  catheterization 12/15/2005    NORMAL. EF 60%  . Coronary artery bypass graft 2007    LIMA GRAFT TO THE LAD, SAPHENOUS VEIN GRAFT TO THE FIRST OBTUSE MARIGNAL, SAPHENOUS VEIN GRAFT TO THE SECOND OBTUSE MARGINAL AND A SAPHENOUS VEIN GRAFT TO THE ACUTE MARGINAL AND POSTERIOR LATERAL BRANCH  . Total hip arthroplasty     right  . Appendectomy   . Carpel tunnel surgery-bilateral   . Knee     left arthroscopic    History  Smoking status  . Never Smoker   Smokeless tobacco  . Not on file    History  Alcohol Use No    Family History  Problem Relation Age of Onset  . Prostate cancer Brother   . Heart disease Brother   . Heart disease Brother     Review of Systems:   All other systems were reviewed and are negative.  Physical Exam: BP 126/78  Pulse 56  Ht 5\' 5"  (1.651 m)  Wt 191 lb 3.2 oz (86.728 kg)  BMI 31.82 kg/m2 He is a pleasant elderly  overweight white male in no acute distress. He is normocephalic, atraumatic. Pupils are equal round and reactive to light. Sclera clear. Oropharynx is clear. Neck is supple without JVD, adenopathy, thyromegaly, or bruits. Lungs are clear. Cardiac exam reveals a regular rate and rhythm without gallop, murmur, or click. Abdomen is soft and nontender. He has no edema. Pedal pulses are good. His neurologic exam is intact. LABORATORY DATA:   Assessment / Plan:

## 2011-05-18 ENCOUNTER — Telehealth: Payer: Self-pay | Admitting: *Deleted

## 2011-05-18 NOTE — Telephone Encounter (Signed)
Message copied by Lorayne Bender on Tue May 18, 2011 11:32 AM ------      Message from: [place], PETER M      Created: Mon May 17, 2011  3:06 PM       Chemistries are normal. Lipids look very good.

## 2011-05-18 NOTE — Telephone Encounter (Signed)
Notified of lab results. Will send to Dr. Valentina Lucks

## 2011-10-07 HISTORY — PX: OTHER SURGICAL HISTORY: SHX169

## 2011-11-24 ENCOUNTER — Encounter: Payer: Self-pay | Admitting: Cardiology

## 2011-11-24 ENCOUNTER — Ambulatory Visit (INDEPENDENT_AMBULATORY_CARE_PROVIDER_SITE_OTHER): Payer: Medicare Other | Admitting: Cardiology

## 2011-11-24 VITALS — BP 128/72 | HR 57 | Ht 66.0 in | Wt 191.8 lb

## 2011-11-24 DIAGNOSIS — I2510 Atherosclerotic heart disease of native coronary artery without angina pectoris: Secondary | ICD-10-CM

## 2011-11-24 DIAGNOSIS — I10 Essential (primary) hypertension: Secondary | ICD-10-CM

## 2011-11-24 DIAGNOSIS — E785 Hyperlipidemia, unspecified: Secondary | ICD-10-CM

## 2011-11-24 NOTE — Patient Instructions (Signed)
Continue your current medications.  I will see you again in 6 months with fasting lab work.   

## 2011-11-24 NOTE — Assessment & Plan Note (Signed)
Well-controlled on current medications 

## 2011-11-24 NOTE — Assessment & Plan Note (Signed)
We will check fasting lab work today and adjust as needed.

## 2011-11-24 NOTE — Progress Notes (Signed)
[name] Date of Birth: December 21, 1927   History of Present Illness: Mr. Suhre is seen today for followup. He has a history of coronary disease status post CABG in 2007. He continues to do very well. He does his own mowing and farming. He doesn't walk much because his knees bother him. He underwent arthroscopic surgery on his left knee this year.  He also has a history of sleep apnea but doesn't like to use his CPAP machine. He denies any significant chest pain or shortness of breath. He has had no palpitations.  Current Outpatient Prescriptions on File Prior to Visit  Medication Sig Dispense Refill  . amLODipine (NORVASC) 2.5 MG tablet Take 2.5 mg by mouth daily.        Marland Kitchen aspirin 81 MG tablet Take 81 mg by mouth daily.        Marland Kitchen doxazosin (CARDURA) 4 MG tablet Take 4 mg by mouth at bedtime.        . finasteride (PROSCAR) 5 MG tablet Take 5 mg by mouth daily.        . fish oil-omega-3 fatty acids 1000 MG capsule Take by mouth every other day.        . lovastatin (MEVACOR) 20 MG tablet Take 20 mg by mouth at bedtime.        . metoprolol tartrate (LOPRESSOR) 25 MG tablet Take 25 mg by mouth 2 (two) times daily.        Marland Kitchen triamterene-hydrochlorothiazide (MAXZIDE) 75-50 MG per tablet Take 1 tablet by mouth daily.        . triazolam (HALCION) 0.25 MG tablet TAKE ONE TABLET AT BEDTIME AS NEEDED FOR SLEEP  30 tablet  5    Allergies  Allergen Reactions  . Codeine   . Prednisone     Past Medical History  Diagnosis Date  . SOB (shortness of breath)   . Hypercholesterolemia   . GERD (gastroesophageal reflux disease)   . Sleep apnea     with use of CPAP  . Generalized OA   . Cranial neuropathy     resolved   . Hypertension   . CAD (coronary artery disease)   . Bronchitis   . BPH (benign prostatic hyperplasia)   . Osteoarthritis     End-stage osteoarthritis, right hip  . Blind   . Urinary retention     now resolved  . Thrombocytopenia     possibly due to Lovenox  . Anemia     Acute blood loss anemia secondary to surgery    Past Surgical History  Procedure Date  . Cardiac catheterization 12/15/2005    NORMAL. EF 60%  . Coronary artery bypass graft 2007    LIMA GRAFT TO THE LAD, SAPHENOUS VEIN GRAFT TO THE FIRST OBTUSE MARIGNAL, SAPHENOUS VEIN GRAFT TO THE SECOND OBTUSE MARGINAL AND A SAPHENOUS VEIN GRAFT TO THE ACUTE MARGINAL AND POSTERIOR LATERAL BRANCH  . Total hip arthroplasty     right  . Appendectomy   . Carpel tunnel surgery-bilateral   . Knee 11/12    left arthroscopic    History  Smoking status  . Never Smoker   Smokeless tobacco  . Not on file    History  Alcohol Use No    Family History  Problem Relation Age of Onset  . Prostate cancer Brother   . Heart disease Brother   . Heart disease Brother     Review of Systems:  As noted in history of present illness. All other systems were  reviewed and are negative.  Physical Exam: BP 128/72  Pulse 57  Ht 5\' 6"  (1.676 m)  Wt 87 kg (191 lb 12.8 oz)  BMI 30.96 kg/m2 He is a pleasant elderly overweight white male in no acute distress. He is normocephalic, atraumatic. Pupils are equal round and reactive to light. Sclera clear. Oropharynx is clear. Neck is supple without JVD, adenopathy, thyromegaly, or bruits. Lungs are clear. Cardiac exam reveals a regular rate and rhythm without gallop, murmur, or click. Abdomen is soft and nontender. He has no edema. Pedal pulses are good. His neurologic exam is intact. LABORATORY DATA: ECG today shows sinus bradycardia with first-degree AV block. It is otherwise normal.  Assessment / Plan:

## 2011-11-24 NOTE — Assessment & Plan Note (Signed)
He remains asymptomatic. His last Cardiolite study in December of 2010 was normal. We'll continue with his risk factor modification and medical therapy.

## 2011-12-06 ENCOUNTER — Ambulatory Visit: Payer: BC Managed Care – PPO | Admitting: Cardiology

## 2012-02-07 ENCOUNTER — Other Ambulatory Visit: Payer: Self-pay | Admitting: Internal Medicine

## 2012-04-18 ENCOUNTER — Telehealth: Payer: Self-pay | Admitting: Internal Medicine

## 2012-04-18 NOTE — Telephone Encounter (Signed)
Dr Maple Hudson, I called the pharmacy to okay triazolam refill and I was advised by pharmacist that the pt is already getting temazepam 30 mg for sleep per Dr. Valentina Lucks. Last rx for this was filled on 03-30-12 for # 30 tablets. Please advise, thanks

## 2012-04-18 NOTE — Telephone Encounter (Signed)
Per [facility]-can not fill Triazolam until patient is seen.

## 2012-04-18 NOTE — Telephone Encounter (Signed)
Per [facility]-okay to refill as requested. 

## 2012-04-18 NOTE — Telephone Encounter (Signed)
I spoke with pt and is aware of this. Nothing further was needed 

## 2012-04-18 NOTE — Telephone Encounter (Signed)
Pt is requesting a refill on his triazolam 0.25 mg. Last refilled 04/16/11 #30 x 5 refills. Pt last OV 10/2006 and has pending consult apt 05/23/12. Please advise Dr. Maple Hudson, thanks

## 2012-05-17 ENCOUNTER — Encounter: Payer: Self-pay | Admitting: Nurse Practitioner

## 2012-05-17 ENCOUNTER — Ambulatory Visit (INDEPENDENT_AMBULATORY_CARE_PROVIDER_SITE_OTHER): Payer: Medicare Other | Admitting: Nurse Practitioner

## 2012-05-17 VITALS — BP 146/74 | HR 68 | Ht 66.0 in | Wt 193.0 lb

## 2012-05-17 DIAGNOSIS — R0600 Dyspnea, unspecified: Secondary | ICD-10-CM

## 2012-05-17 DIAGNOSIS — R609 Edema, unspecified: Secondary | ICD-10-CM

## 2012-05-17 DIAGNOSIS — R0989 Other specified symptoms and signs involving the circulatory and respiratory systems: Secondary | ICD-10-CM

## 2012-05-17 DIAGNOSIS — R0609 Other forms of dyspnea: Secondary | ICD-10-CM

## 2012-05-17 NOTE — Patient Instructions (Addendum)
Stop the Norvasc (amlodipine)  Take a whole Maxzide every day  Use support stockings  We are going to check some labs today  Keep a check on your blood pressure for me  We are going to arrange for an ultrasound of your heart  Minimize your salt  I will see you in a month  Call the [facility] office at [phone] if you have any questions, problems or concerns.

## 2012-05-17 NOTE — Assessment & Plan Note (Signed)
Patient presents with a one month history of DOE and swelling. He has known CAD. Will check an echo. Checking labs today. I have left him on a whole tablet of his Maxzide every day and stopped his Norvasc. He will continue to monitor his blood pressure at home. He is advised to cut back on the salt as well. I will see him back in a month to reassess. Patient is agreeable to this plan and will call if any problems develop in the interim.

## 2012-05-17 NOTE — Progress Notes (Signed)
Scott [name] Date of Birth: 1927-12-27 Medical Record #[id number]  History of Present Illness: Scott [name] is seen today for a work in visit. He is seen for Dr. [place]. He has known CAD with remote CABG in 2007. Negative stress testing in 2010. Other issues include obesity, HTN, HLD and OSA. He does have some generalized arthritis and has had arthroscopy of his knee earlier this year.  He comes in today. He is here with his wife. He notes that for the past month he has had more swelling and feels more short of breath with exertion. He says he is staying active and not using salt. His wife gives a different opinion and thinks he is more sedentary and using too much salt. He had his Maxzide cut back about a year ago but doesn't really know why. He recently increased it back to a whole tablet every other day on his own but it really hasn't helped. He has no cough, PND, orthopnea but does note some bloating and the swelling. No chest pain reported. He says his blood pressure has been great at home with readings in the 120 to 130's systolic.   Current Outpatient Prescriptions on File Prior to Visit  Medication Sig Dispense Refill  . aspirin 81 MG tablet Take 81 mg by mouth daily.        Marland Kitchen doxazosin (CARDURA) 4 MG tablet Take 4 mg by mouth at bedtime.        . finasteride (PROSCAR) 5 MG tablet Take 5 mg by mouth daily.        . fish oil-omega-3 fatty acids 1000 MG capsule Take by mouth every other day.        . lovastatin (MEVACOR) 20 MG tablet Take 20 mg by mouth at bedtime.        . metoprolol tartrate (LOPRESSOR) 25 MG tablet Take 25 mg by mouth 2 (two) times daily.        Marland Kitchen triamterene-hydrochlorothiazide (MAXZIDE) 75-50 MG per tablet Take 1 tablet by mouth daily.        . triazolam (HALCION) 0.25 MG tablet TAKE ONE TABLET AT BEDTIME AS NEEDED FOR SLEEP  30 tablet  5    Allergies  Allergen Reactions  . Codeine   . Prednisone     Past Medical History  Diagnosis Date  . SOB (shortness of  breath)   . Hypercholesterolemia   . GERD (gastroesophageal reflux disease)   . Sleep apnea     with use of CPAP  . Generalized OA   . Cranial neuropathy     resolved   . Hypertension   . CAD (coronary artery disease)     s/p CABG in 2007  . Bronchitis   . BPH (benign prostatic hyperplasia)   . Osteoarthritis     End-stage osteoarthritis, right hip  . Blind   . Thrombocytopenia     possibly due to Lovenox  . Anemia     Acute blood loss anemia secondary to surgery  . Obesity     Past Surgical History  Procedure Date  . Cardiac catheterization 12/15/2005    NORMAL. EF 60%  . Coronary artery bypass graft 2007    LIMA GRAFT TO THE LAD, SAPHENOUS VEIN GRAFT TO THE FIRST OBTUSE MARIGNAL, SAPHENOUS VEIN GRAFT TO THE SECOND OBTUSE MARGINAL AND A SAPHENOUS VEIN GRAFT TO THE ACUTE MARGINAL AND POSTERIOR LATERAL BRANCH  . Total hip arthroplasty     right  . Appendectomy   . Carpel tunnel surgery-bilateral   .  Knee 11/12    left arthroscopic    History  Smoking status  . Never Smoker   Smokeless tobacco  . Not on file    History  Alcohol Use No    Family History  Problem Relation Age of Onset  . Prostate cancer Brother   . Heart disease Brother   . Heart disease Brother     Review of Systems: The review of systems is positive for swelling and DOE.  All other systems were reviewed and are negative.  Physical Exam: BP 146/74  Pulse 68  Ht 5\' 6"  (1.676 m)  Wt 193 lb (87.544 kg)  BMI 31.15 kg/m2  SpO2 95% Patient is very pleasant and in no acute distress. Skin is warm and dry. Color is normal.  HEENT is unremarkable. Normocephalic/atraumatic. PERRL. Sclera are nonicteric. Neck is supple. No masses. No JVD. Lungs are clear. Cardiac exam shows a regular rate and rhythm. Abdomen is soft. Extremities are with 1+ pitting edema bilaterally. Gait and ROM are intact. No gross neurologic deficits noted.  LABORATORY DATA: PENDING   Assessment / Plan:

## 2012-05-18 LAB — CBC WITH DIFFERENTIAL/PLATELET
Basophils Absolute: 0 10*3/uL (ref 0.0–0.1)
Basophils Relative: 0.5 % (ref 0.0–3.0)
Eosinophils Absolute: 0.2 10*3/uL (ref 0.0–0.7)
Eosinophils Relative: 2.3 % (ref 0.0–5.0)
HCT: 46 % (ref 39.0–52.0)
Hemoglobin: 15.2 g/dL (ref 13.0–17.0)
Lymphocytes Relative: 24.8 % (ref 12.0–46.0)
Lymphs Abs: 2.1 10*3/uL (ref 0.7–4.0)
MCHC: 33 g/dL (ref 30.0–36.0)
MCV: 99.4 fl (ref 78.0–100.0)
Monocytes Absolute: 0.9 10*3/uL (ref 0.1–1.0)
Monocytes Relative: 10.8 % (ref 3.0–12.0)
Neutro Abs: 5.2 10*3/uL (ref 1.4–7.7)
Neutrophils Relative %: 61.6 % (ref 43.0–77.0)
Platelets: 119 10*3/uL — ABNORMAL LOW (ref 150.0–400.0)
RBC: 4.63 Mil/uL (ref 4.22–5.81)
RDW: 13.7 % (ref 11.5–14.6)
WBC: 8.4 10*3/uL (ref 4.5–10.5)

## 2012-05-18 LAB — BASIC METABOLIC PANEL
BUN: 20 mg/dL (ref 6–23)
CO2: 30 mEq/L (ref 19–32)
Calcium: 9.3 mg/dL (ref 8.4–10.5)
Chloride: 101 mEq/L (ref 96–112)
Creatinine, Ser: 1 mg/dL (ref 0.4–1.5)
GFR: 73.52 mL/min (ref >60.00)
Glucose, Bld: 100 mg/dL — ABNORMAL HIGH (ref 70–99)
Potassium: 3.6 mEq/L (ref 3.5–5.1)
Sodium: 140 mEq/L (ref 135–145)

## 2012-05-22 ENCOUNTER — Telehealth: Payer: Self-pay | Admitting: Nurse Practitioner

## 2012-05-22 NOTE — Telephone Encounter (Signed)
Pt rtn call, pls call  °

## 2012-05-22 NOTE — Telephone Encounter (Signed)
Will ask Harriett Sine to call

## 2012-05-22 NOTE — Telephone Encounter (Signed)
Spoke with pt and advised the reason we called was to let him know his lab work is normal

## 2012-05-23 ENCOUNTER — Encounter: Payer: Self-pay | Admitting: Internal Medicine

## 2012-05-23 ENCOUNTER — Ambulatory Visit (INDEPENDENT_AMBULATORY_CARE_PROVIDER_SITE_OTHER): Payer: Medicare Other | Admitting: Internal Medicine

## 2012-05-23 ENCOUNTER — Ambulatory Visit (HOSPITAL_COMMUNITY): Payer: Medicare Other | Attending: Cardiovascular Disease

## 2012-05-23 VITALS — BP 124/72 | HR 75 | Ht 65.0 in | Wt 194.8 lb

## 2012-05-23 DIAGNOSIS — G4700 Insomnia, unspecified: Secondary | ICD-10-CM

## 2012-05-23 DIAGNOSIS — I2510 Atherosclerotic heart disease of native coronary artery without angina pectoris: Secondary | ICD-10-CM | POA: Insufficient documentation

## 2012-05-23 DIAGNOSIS — R0600 Dyspnea, unspecified: Secondary | ICD-10-CM

## 2012-05-23 DIAGNOSIS — G4733 Obstructive sleep apnea (adult) (pediatric): Secondary | ICD-10-CM

## 2012-05-23 DIAGNOSIS — R0989 Other specified symptoms and signs involving the circulatory and respiratory systems: Secondary | ICD-10-CM

## 2012-05-23 DIAGNOSIS — G4730 Sleep apnea, unspecified: Secondary | ICD-10-CM | POA: Insufficient documentation

## 2012-05-23 DIAGNOSIS — R609 Edema, unspecified: Secondary | ICD-10-CM

## 2012-05-23 DIAGNOSIS — R0609 Other forms of dyspnea: Secondary | ICD-10-CM | POA: Insufficient documentation

## 2012-05-23 DIAGNOSIS — I10 Essential (primary) hypertension: Secondary | ICD-10-CM | POA: Insufficient documentation

## 2012-05-23 DIAGNOSIS — E785 Hyperlipidemia, unspecified: Secondary | ICD-10-CM | POA: Insufficient documentation

## 2012-05-23 MED ORDER — TRIAZOLAM 0.25 MG PO TABS: 0.2500 mg | ORAL_TABLET | Freq: Every evening | ORAL | Status: DC | PRN

## 2012-05-23 NOTE — Progress Notes (Signed)
Echocardiogram performed.  

## 2012-05-23 NOTE — Patient Instructions (Addendum)
Script Halcion to help with sleep if needed  Order- DME [facility]-   autotitration 8-15 cwp x 7 days for pressure recommendation        Dx OSA                                          Replacement CPAP mask of choice and supplies

## 2012-05-23 NOTE — Progress Notes (Signed)
05/23/12- 78 yo M never smoker Former patient-last seen 2007 for OSA/ AHC- CPAPhas not been serviced in a long time. Wife here NPSG 10/14/02- AHI 15.9/ hr, CPAP titrated to 8, body weight 180 lbs.  He has not used CPAP in 2 years but admits now that he was better off when he wore it. He had taken Halcion in the past but has run out. Usual bedtime 10 or 10:30 PM with prolonged sleep latency and frequent waking after sleep onset before up between 7 and 8 AM. Weight has been stable. 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 Medical history is significant for coronary disease with bypass grafting.  Prior to Admission medications   Medication Sig Start Date End Date Taking? Authorizing Provider  aspirin 81 MG tablet Take 81 mg by mouth daily.     Yes Historical Provider, MD  doxazosin (CARDURA) 4 MG tablet Take 4 mg by mouth at bedtime.     Yes Historical Provider, MD  finasteride (PROSCAR) 5 MG tablet Take 5 mg by mouth daily.     Yes Historical Provider, MD  fish oil-omega-3 fatty acids 1000 MG capsule Take by mouth every other day.     Yes Historical Provider, MD  lovastatin (MEVACOR) 20 MG tablet Take 20 mg by mouth at bedtime.     Yes Historical Provider, MD  metoprolol tartrate (LOPRESSOR) 25 MG tablet Take 25 mg by mouth 2 (two) times daily.     Yes Historical Provider, MD  triamterene-hydrochlorothiazide (MAXZIDE) 75-50 MG per tablet Take 1 tablet by mouth daily.     Yes Historical Provider, MD  triazolam (HALCION) 0.25 MG tablet Take 1 tablet (0.25 mg total) by mouth at bedtime as needed (If needed for sleep). 05/23/12 05/23/13 Yes Waymon Budge, MD   Past Medical History  Diagnosis Date  . SOB (shortness of breath)   . Hypercholesterolemia   . GERD (gastroesophageal reflux disease)   . Sleep apnea     with use of CPAP  . Generalized OA   . Cranial neuropathy     resolved   . Hypertension   . CAD (coronary artery disease)     s/p CABG in 2007   . Bronchitis   . BPH (benign prostatic hyperplasia)   . Osteoarthritis     End-stage osteoarthritis, right hip  . Blind   . Thrombocytopenia     possibly due to Lovenox  . Anemia     Acute blood loss anemia secondary to surgery  . Obesity    Past Surgical History  Procedure Date  . Cardiac catheterization 12/15/2005    NORMAL. EF 60%  . Coronary artery bypass graft 2007    LIMA GRAFT TO THE LAD, SAPHENOUS VEIN GRAFT TO THE FIRST OBTUSE MARIGNAL, SAPHENOUS VEIN GRAFT TO THE SECOND OBTUSE MARGINAL AND A SAPHENOUS VEIN GRAFT TO THE ACUTE MARGINAL AND POSTERIOR LATERAL BRANCH  . Total hip arthroplasty     right  . Appendectomy   . Carpel tunnel surgery-bilateral   . Knee 11/12    left arthroscopic   Family History  Problem Relation Age of Onset  . Prostate cancer Brother   . Heart disease Brother   . Heart disease Brother    History   Social History  . Marital Status: Married    Spouse Name: N/A    Number of Children: 2  . Years of Education: N/A   Occupational History  . [employer]  retired   Social History Main Topics  . Smoking status: Never Smoker   . Smokeless tobacco: Not on file  . Alcohol Use: No  . Drug Use: No  . Sexually Active: Not Currently   Other Topics Concern  . Not on file   Social History Narrative  . No narrative on file   ROS-see HPI Constitutional:   No-   weight loss, night sweats, fevers, chills, +fatigue, lassitude. HEENT:   No-  headaches, difficulty swallowing, tooth/dental problems, sore throat,       No-  sneezing, itching, ear ache, +nasal congestion, post nasal drip,  CV:  No-   chest pain, orthopnea, PND, +swelling in lower extremities, No-anasarca, dizziness, palpitations Resp: +  shortness of breath with exertion or at rest.              No-   productive cough,  No non-productive cough,  No- coughing up of blood.              No-   change in color of mucus.  No- wheezing.   Skin: No-   rash or lesions. GI:   No-   heartburn, indigestion, abdominal pain, nausea, vomiting, diarrhea,                 change in bowel habits, loss of appetite GU: No-   dysuria, change in color of urine, no urgency or frequency.  No- flank pain. MS:  No-   joint pain or swelling.  No- decreased range of motion.  No- back pain. Neuro-     nothing unusual Psych:  No- change in mood or affect. No depression or anxiety.  No memory loss.  OBJ- Physical Exam General- Alert, Oriented, Affect-appropriate, Distress- none acute, overweight Skin- rash-none, lesions- none, excoriation- none Lymphadenopathy- none Head- atraumatic            Eyes- Gross vision intact, PERRLA, conjunctivae and secretions clear            Ears- Hearing, canals-normal            Nose- Clear, no-Septal dev, mucus, polyps, erosion, perforation             Throat- Mallampati II-III , mucosa clear , drainage- none, tonsils- atrophic Neck- flexible , trachea midline, no stridor , thyroid nl, carotid no bruit Chest - symmetrical excursion , unlabored           Heart/CV- RRR , no murmur , no gallop  , no rub, nl s1 s2                           - JVD- none , edema- none, stasis changes- none, varices- none           Lung- clear to P&A, wheeze- none, cough- none , dullness-none, rub- none           Chest wall-  Abd- tender-no, distended-no, bowel sounds-present, HSM- no Br/ Gen/ Rectal- Not done, not indicated Extrem- cyanosis- none, clubbing, none, atrophy- none, strength- nl Neuro- grossly intact to observation

## 2012-05-31 NOTE — Assessment & Plan Note (Signed)
Plan-Halcion and discussion of sleep hygiene.

## 2012-05-31 NOTE — Assessment & Plan Note (Signed)
He is a never smoker with known cardiac limitations and recent cardiology evaluation. I doubt a primary pulmonary contribution but this can be reassessed if appropriate later.

## 2012-05-31 NOTE — Assessment & Plan Note (Signed)
He had been using CPAP 8/[facility] with variable compliance before the machine failed. He is motivated to get restarted now. He has difficulty initiating and maintaining sleep without sleeping pill. This may be a separate diagnosis of insomnia and can be watched. Plan-CPAP autotitration/[facility], Halcionn 0.25 mg with discussion of medication issues and sleep hygiene. We also discussed his driving responsibilities.

## 2012-06-06 ENCOUNTER — Encounter: Payer: Self-pay | Admitting: Internal Medicine

## 2012-06-13 ENCOUNTER — Encounter: Payer: Self-pay | Admitting: Internal Medicine

## 2012-06-19 ENCOUNTER — Encounter: Payer: Self-pay | Admitting: Nurse Practitioner

## 2012-06-19 ENCOUNTER — Ambulatory Visit (INDEPENDENT_AMBULATORY_CARE_PROVIDER_SITE_OTHER): Payer: Medicare Other | Admitting: Nurse Practitioner

## 2012-06-19 VITALS — BP 144/70 | HR 60 | Ht 65.0 in | Wt 192.0 lb

## 2012-06-19 DIAGNOSIS — R0600 Dyspnea, unspecified: Secondary | ICD-10-CM

## 2012-06-19 DIAGNOSIS — I10 Essential (primary) hypertension: Secondary | ICD-10-CM

## 2012-06-19 DIAGNOSIS — I2510 Atherosclerotic heart disease of native coronary artery without angina pectoris: Secondary | ICD-10-CM

## 2012-06-19 DIAGNOSIS — R0609 Other forms of dyspnea: Secondary | ICD-10-CM

## 2012-06-19 DIAGNOSIS — R0989 Other specified symptoms and signs involving the circulatory and respiratory systems: Secondary | ICD-10-CM

## 2012-06-19 MED ORDER — AMOXICILLIN 500 MG PO CAPS: 500.0000 mg | ORAL_CAPSULE | Freq: Two times a day (BID) | ORAL | Status: AC

## 2012-06-19 NOTE — Progress Notes (Signed)
Scott [name] Date of Birth: 05/19/1928 Medical Record #[id number]  History of Present Illness: Scott [name] is seen today for a follow up visit. He is seen for Dr. Swazil[place]. He has known CAD with remote CABG back in 2007. Negative stress testing in 2010. Other issues include obesity, HTN, HLD and OSA. I saw him a month ago with swelling and DOE. He had already increased his diuretic. I stopped his Norvasc.  He comes in today. He is here with his wife. Has a bad cold. Blood pressure is doing well at home and he has good readings for the most part. No chest pain. Not short of breath. Swelling has resolved. He is happy with how he is except for this summer cold.   Current Outpatient Prescriptions on File Prior to Visit  Medication Sig Dispense Refill  . aspirin 81 MG tablet Take 81 mg by mouth daily.        Marland Kitchen doxazosin (CARDURA) 4 MG tablet Take 4 mg by mouth at bedtime.        . finasteride (PROSCAR) 5 MG tablet Take 5 mg by mouth daily.        . fish oil-omega-3 fatty acids 1000 MG capsule Take by mouth every other day.        . lovastatin (MEVACOR) 20 MG tablet Take 20 mg by mouth at bedtime.        . metoprolol tartrate (LOPRESSOR) 25 MG tablet Take 25 mg by mouth 2 (two) times daily.        Marland Kitchen triamterene-hydrochlorothiazide (MAXZIDE) 75-50 MG per tablet Take 1 tablet by mouth daily.        . triazolam (HALCION) 0.25 MG tablet Take 1 tablet (0.25 mg total) by mouth at bedtime as needed (If needed for sleep).  30 tablet  5    Allergies  Allergen Reactions  . Amlodipine     Swelling of feet and legs  . Codeine   . Prednisone     Past Medical History  Diagnosis Date  . SOB (shortness of breath)   . Hypercholesterolemia   . GERD (gastroesophageal reflux disease)   . Sleep apnea     with use of CPAP  . Generalized OA   . Cranial neuropathy     resolved   . Hypertension   . CAD (coronary artery disease)     s/p CABG in 2007  . Bronchitis   . BPH (benign prostatic hyperplasia)   .  Osteoarthritis     End-stage osteoarthritis, right hip  . Blind   . Thrombocytopenia     possibly due to Lovenox  . Anemia     Acute blood loss anemia secondary to surgery  . Obesity     Past Surgical History  Procedure Date  . Cardiac catheterization 12/15/2005    NORMAL. EF 60%  . Coronary artery bypass graft 2007    LIMA GRAFT TO THE LAD, SAPHENOUS VEIN GRAFT TO THE FIRST OBTUSE MARIGNAL, SAPHENOUS VEIN GRAFT TO THE SECOND OBTUSE MARGINAL AND A SAPHENOUS VEIN GRAFT TO THE ACUTE MARGINAL AND POSTERIOR LATERAL BRANCH  . Total hip arthroplasty     right  . Appendectomy   . Carpel tunnel surgery-bilateral   . Knee 11/12    left arthroscopic    History  Smoking status  . Never Smoker   Smokeless tobacco  . Not on file    History  Alcohol Use No    Family History  Problem Relation Age of Onset  . Prostate  cancer Brother   . Heart disease Brother   . Heart disease Brother     Review of Systems: The review of systems is positive for URI.  All other systems were reviewed and are negative.  Physical Exam: BP 144/70  Pulse 60  Ht 5\' 5"  (1.651 m)  Wt 192 lb (87.091 kg)  BMI 31.95 kg/m2 Patient is very pleasant and in no acute distress. Skin is warm and dry. Color is normal.  HEENT is unremarkable. Normocephalic/atraumatic. PERRL. Sclera are nonicteric. Neck is supple. No masses. No JVD. Lungs are clear. Cardiac exam shows a regular rate and rhythm. Abdomen is soft. Extremities are without edema. Gait and ROM are intact. No gross neurologic deficits noted.  LABORATORY DATA:   Echo Study Conclusions  - Left ventricle: The cavity size was normal. There was mild focal basal hypertrophy of the septum. Systolic function was normal. The estimated ejection fraction was in the range of 55% to 65%. Wall motion was normal; there were no regional wall motion abnormalities. Doppler parameters are consistent with abnormal left ventricular relaxation (grade 1 diastolic  dysfunction). - Aortic valve: Trivial regurgitation. - Mitral valve: Mild regurgitation. - Pulmonary arteries: Systolic pressure was mildly increased. PA peak pressure: 41mm Hg (S).  Lab Results  Component Value Date   WBC 8.4 05/17/2012   HGB 15.2 05/17/2012   HCT 46.0 05/17/2012   PLT 119.0* 05/17/2012   GLUCOSE 100* 05/17/2012   CHOL 152 05/17/2011   TRIG 148.0 05/17/2011   HDL 40.30 05/17/2011   LDLCALC 82 05/17/2011   ALT 18 05/17/2011   AST 19 05/17/2011   NA 140 05/17/2012   K 3.6 05/17/2012   CL 101 05/17/2012   CREATININE 1.0 05/17/2012   BUN 20 05/17/2012   CO2 30 05/17/2012      Assessment / Plan:

## 2012-06-19 NOTE — Patient Instructions (Addendum)
Use Tylenol & Coricidin as needed  I have sent in a prescription for amoxicillin 500 mg to take twice a day for the next week  See Dr. Swaziland in December  Stay on your current medicines  Keep a check on your blood pressure and let us know if your readings are above 150 consistently  Watch the salt  Call the [facility] office at [phone] if you have any questions, problems or concerns.

## 2012-06-19 NOTE — Assessment & Plan Note (Signed)
Blood pressure is ok at home. He is tolerating his current regimen. Salt restriction is encouraged. We will keep him on his current regimen. Will see again in 6 months. Patient is agreeable to this plan and will call if any problems develop in the interim.

## 2012-06-19 NOTE — Assessment & Plan Note (Signed)
Now resolved. Echo looked satisfactory.

## 2012-06-19 NOTE — Assessment & Plan Note (Signed)
No chest pain reported 

## 2012-07-06 ENCOUNTER — Encounter: Payer: Self-pay | Admitting: Internal Medicine

## 2012-07-06 ENCOUNTER — Ambulatory Visit (INDEPENDENT_AMBULATORY_CARE_PROVIDER_SITE_OTHER): Payer: Medicare Other | Admitting: Internal Medicine

## 2012-07-06 VITALS — BP 142/70 | HR 66 | Ht 65.0 in | Wt 192.0 lb

## 2012-07-06 DIAGNOSIS — J209 Acute bronchitis, unspecified: Secondary | ICD-10-CM

## 2012-07-06 DIAGNOSIS — G4733 Obstructive sleep apnea (adult) (pediatric): Secondary | ICD-10-CM

## 2012-07-06 MED ORDER — METHYLPREDNISOLONE ACETATE 80 MG/ML IJ SUSP
80.0000 mg | Freq: Once | INTRAMUSCULAR | Status: AC
  Administered 2012-07-06: 80 mg via INTRAMUSCULAR

## 2012-07-06 NOTE — Patient Instructions (Addendum)
Depo 80  Order- replacement CPAP machine, humidifier, supplies, set at 10     Dx OSA

## 2012-07-06 NOTE — Progress Notes (Signed)
05/23/12- 78 yo M never smoker Former patient-last seen 2007 for OSA/ AHC- CPAPhas not been serviced in a long time. Wife here NPSG 10/14/02- AHI 15.9/ hr, CPAP titrated to 8, body weight 180 lbs.  He has not used CPAP in 2 years but admits now that he was better off when he wore it. He had taken Halcion in the past but has run out. Usual bedtime 10 or 10:30 PM with prolonged sleep latency and frequent waking after sleep onset before up between 7 and 8 AM. Weight has been stable. 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 Medical history is significant for coronary disease with bypass grafting.  07/06/12- 87 yo M never smoker Former patient-last seen 2007 for OSA/ AHC- CPAP, Rhinitis, complicated by CAD/ CABG. Needs Rx sent to [facility] for new CPAP; wears for approx 6 hours; unsure if losing pressure or not. Needs new CPAP machine. New problem-3 weeks myalgias, cough, sore throat, no fever. Wife had this illness first. Similar illness in the family the last 3 summers- has needed steroids  ROS-see HPI Constitutional:   No-   weight loss, night sweats, fevers, chills, +fatigue, lassitude. HEENT:   No-  headaches, difficulty swallowing, tooth/dental problems, sore throat,       No-  sneezing, itching, ear ache, +nasal congestion, post nasal drip,  CV:  No-   chest pain, orthopnea, PND, +swelling in lower extremities, No-anasarca, dizziness, palpitations Resp: +  shortness of breath with exertion or at rest.              No-   productive cough,  No non-productive cough,  No- coughing up of blood.              No-   change in color of mucus.  No- wheezing.   Skin: No-   rash or lesions. GI:  No-   heartburn, indigestion, abdominal pain, nausea, vomiting,  GU: n. MS:  No-   joint pain or swelling.   Neuro-     nothing unusual Psych:  No- change in mood or affect. No depression or anxiety.  No memory loss.  OBJ- Physical Exam General- Alert, Oriented,  Affect-appropriate, Distress- none acute, overweight Skin- rash-none, lesions- none, excoriation- none Lymphadenopathy- none Head- atraumatic            Eyes- Gross vision intact, PERRLA, conjunctivae and secretions clear            Ears- Hearing, canals-normal            Nose- Clear, no-Septal dev, mucus, polyps, erosion, perforation             Throat- Mallampati III-IV , mucosa not red , drainage- none, tonsils- atrophic Neck- flexible , trachea midline, no stridor , thyroid nl, carotid no bruit Chest - symmetrical excursion , unlabored           Heart/CV- RRR , no murmur , no gallop  , no rub, nl s1 s2                           - JVD- none , edema- none, stasis changes- none, varices- none           Lung- clear to P&A, wheeze- none, cough- none , dullness-none, rub- none           Chest wall-  Abd-  Br/ Gen/ Rectal- Not done, not indicated Extrem- cyanosis- none, clubbing,  none, atrophy- none, strength- nl Neuro- grossly intact to observation

## 2012-07-07 ENCOUNTER — Other Ambulatory Visit: Payer: Self-pay | Admitting: Internal Medicine

## 2012-07-07 ENCOUNTER — Telehealth: Payer: Self-pay | Admitting: Internal Medicine

## 2012-07-07 DIAGNOSIS — G4733 Obstructive sleep apnea (adult) (pediatric): Secondary | ICD-10-CM

## 2012-07-07 NOTE — Telephone Encounter (Signed)
Ok to order NPSG split protocol for dx OSA

## 2012-07-07 NOTE — Telephone Encounter (Signed)
Order placed to [facility] as requested.  

## 2012-07-07 NOTE — Telephone Encounter (Signed)
Dr Maple Hudson, do you want to go ahead and order another sleep study? Please advise, thanks!

## 2012-07-11 DIAGNOSIS — J209 Acute bronchitis, unspecified: Secondary | ICD-10-CM

## 2012-07-11 HISTORY — DX: Acute bronchitis, unspecified: J20.9

## 2012-07-11 NOTE — Assessment & Plan Note (Signed)
Plan-Depo-Medrol 80 mg. Watch for possibility this reflects airway drying.

## 2012-07-11 NOTE — Assessment & Plan Note (Signed)
We are going to set his pressure up a little to 10 with new machine.

## 2012-07-30 ENCOUNTER — Ambulatory Visit (HOSPITAL_BASED_OUTPATIENT_CLINIC_OR_DEPARTMENT_OTHER): Payer: Medicare Other | Attending: Internal Medicine | Admitting: Radiology

## 2012-07-30 VITALS — Ht 65.0 in | Wt 190.0 lb

## 2012-07-30 DIAGNOSIS — G4733 Obstructive sleep apnea (adult) (pediatric): Secondary | ICD-10-CM

## 2012-08-06 NOTE — Procedures (Signed)
NAME:  Scott [name], Scott [name] NO.:  [id number]  MEDICAL RECORD NO.:  [id number]          PATIENT TYPE:  OUT  LOCATION:  SLEEP CENTER                 FACILITY:  [facility]  PHYSICIAN:  Hridaan Bouse D. Maple Hudson, MD, FCCP, FACPDATE OF BIRTH:  1928-08-08  DATE OF STUDY:  07/30/2012                           NOCTURNAL POLYSOMNOGRAM  REFERRING PHYSICIAN:  Klair Leising D. Taleen Prosser, MD, FCCP, FACP  INDICATION FOR STUDY:  Hypersomnia with sleep apnea.  EPWORTH SLEEPINESS SCORE:  3/24.  BMI 31.6, weight 190 pounds, height 65 inches, neck 16.5 inches.  MEDICATIONS:  Home medications are charted and reviewed.  SLEEP ARCHITECTURE:  Total sleep time 225 minutes with sleep efficiency 56.5%.  Stage I was 4.4%, stage II 84.7%, stage III absent.  REM 10.9% of total sleep time.  Sleep latency 26 minutes, REM latency 79.5 minutes, awake after sleep onset 54 minutes.  Arousal index 20.  Bedtime medication:  Triazolam.  RESPIRATORY DATA:  Apnea-hypopnea index (AHI) 6.7 per hour.  A total of 25 events was scored including 1 obstructive apnea, 2 mixed apneas, 22 hypopneas.  Events were associated with nonsupine sleep position and REM.  REM AHI 41.6 per hour.  There were not enough for early respiratory events to meet protocol requirements for split protocol CPAP titration on this study.  OXYGEN DATA:  Mild-to-moderate snoring with oxygen desaturation to a nadir of 76% and mean oxygen saturation through the study of 92.1% on room air.  During the total recording time 41.9 minutes was recorded with saturation less than 90% and 37.3 minutes recorded with saturation less than 88% on room air.  CARDIAC DATA:  Sinus rhythm with occasional PVC.  MOVEMENT-PARASOMNIA:  A total of 71 limb jerks were counted, of which 19 were associated with arousal or awakening for periodic limb movement with arousal index of 5.1 per hour. Bathroom x1.  IMPRESSIONS-RECOMMENDATIONS: 1. Mild obstructive sleep apnea/hypopnea  syndrome, AHI 6.7 per hour     with mainly nonsupine and REM associated events.  REM AHI 41.6 per     hour.  Mild-to-moderate snoring with oxygen desaturation to a nadir     of 76% and mean oxygen saturation through the study of 92.1% on     room air.  There were insufficient numbers of events to permit     application of split protocol, CPAP titration on this study.  2.  On arrival, room air oxygen saturation while awake was 95%.  During     the study, he desaturated to a nadir of 76% with a mean of 92.1% on     room air.  During the total recording time 41.9 minutes were     recorded with oxygen saturation less than 90% and 37.3 minutes with     saturation less than 88% on room air.  Consider evaluation by     overnight oximetry if appropriate. 2. Periodic limb movement syndrome with arousal.  A total of 71 limb     jerks were counted, of which 19 were     associated with arousals or awakening for periodic limb movement     with arousal index of 5.1 per hour.  Consider if specific therapy  would be helpful.     Jayona Mccaig D. Maple Hudson, MD, [facility], FACP Diplomate, American Board of Sleep Medicine    CDY/MEDQ  D:  08/05/2012 10:07:38  T:  08/06/2012 01:28:01  Job:  [id number]

## 2012-08-12 DIAGNOSIS — R0609 Other forms of dyspnea: Secondary | ICD-10-CM

## 2012-08-12 DIAGNOSIS — G4761 Periodic limb movement disorder: Secondary | ICD-10-CM

## 2012-08-12 DIAGNOSIS — R0989 Other specified symptoms and signs involving the circulatory and respiratory systems: Secondary | ICD-10-CM

## 2012-08-12 DIAGNOSIS — G4733 Obstructive sleep apnea (adult) (pediatric): Secondary | ICD-10-CM

## 2012-09-05 ENCOUNTER — Ambulatory Visit (INDEPENDENT_AMBULATORY_CARE_PROVIDER_SITE_OTHER): Payer: Medicare Other | Admitting: Internal Medicine

## 2012-09-05 ENCOUNTER — Encounter: Payer: Self-pay | Admitting: Internal Medicine

## 2012-09-05 VITALS — BP 120/76 | HR 54 | Ht 65.0 in | Wt 192.4 lb

## 2012-09-05 DIAGNOSIS — G4733 Obstructive sleep apnea (adult) (pediatric): Secondary | ICD-10-CM

## 2012-09-05 DIAGNOSIS — Z23 Encounter for immunization: Secondary | ICD-10-CM

## 2012-09-05 DIAGNOSIS — R0989 Other specified symptoms and signs involving the circulatory and respiratory systems: Secondary | ICD-10-CM

## 2012-09-05 DIAGNOSIS — R0600 Dyspnea, unspecified: Secondary | ICD-10-CM

## 2012-09-05 MED ORDER — ZALEPLON 5 MG PO CAPS: 5.0000 mg | ORAL_CAPSULE | Freq: Every day | ORAL | Status: DC

## 2012-09-05 NOTE — Patient Instructions (Addendum)
Order- [facility]- replacement CPAP machine 10 cwp, humidifier and supplies    Dx OSA     Old machine no longer functioning well  Script to try Sonata for sleep instead of halcion

## 2012-09-05 NOTE — Progress Notes (Signed)
05/23/12- 78 yo M never smoker Former patient-last seen 2007 for OSA/ AHC- CPAPhas not been serviced in a long time. Wife here NPSG 10/14/02- AHI 15.9/ hr, CPAP titrated to 8, body weight 180 lbs.  He has not used CPAP in 2 years but admits now that he was better off when he wore it. He had taken Halcion in the past but has run out. Usual bedtime 10 or 10:30 PM with prolonged sleep latency and frequent waking after sleep onset before up between 7 and 8 AM. Weight has been stable. 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 Medical history is significant for coronary disease with bypass grafting.  07/06/12- 80 yo M never smoker Former patient-last seen 2007 for OSA/ AHC- CPAP, Rhinitis, complicated by CAD/ CABG. Needs Rx sent to [facility] for new CPAP; wears for approx 6 hours; unsure if losing pressure or not. Needs new CPAP machine. New problem-3 weeks myalgias, cough, sore throat, no fever. Wife had this illness first. Similar illness in the family the last 3 summers- has needed steroids  09/05/12- 44 yo M never smoker Former patient-last seen 2007 for OSA/ AHC- CPAP, Rhinitis, complicated by CAD/ CABG.  wife here Wife had been waking husband because of apnea and loud snore. No longer snores wearing CPAP, likes nasal mask. Disliked Halcion. NPSG 07/30/12- mild obstructive sleep apnea with AHI 6.7 per hour  ROS-see HPI Constitutional:   No-   weight loss, night sweats, fevers, chills, +fatigue, lassitude. HEENT:   No-  headaches, difficulty swallowing, tooth/dental problems, sore throat,       No-  sneezing, itching, ear ache, +nasal congestion, post nasal drip,  CV:  No-   chest pain, orthopnea, PND, +swelling in lower extremities, No-anasarca, dizziness, palpitations Resp: +  shortness of breath with exertion or at rest.              No-   productive cough,  No non-productive cough,  No- coughing up of blood.              No-   change in color of mucus.  No-  wheezing.   Skin: No-   rash or lesions. GI:  No-   heartburn, indigestion, abdominal pain, nausea, vomiting,  GU: n. MS:  No-   joint pain or swelling.   Neuro-     nothing unusual Psych:  No- change in mood or affect. No depression or anxiety.  No memory loss.  OBJ- Physical Exam General- Alert, Oriented, Affect-appropriate, Distress- none acute, overweight Skin- rash-none, lesions- none, excoriation- none Lymphadenopathy- none Head- atraumatic            Eyes- Gross vision intact, PERRLA, conjunctivae and secretions clear            Ears- Hearing, canals-normal            Nose- Clear, no-Septal dev, mucus, polyps, erosion, perforation             Throat- Mallampati III-IV , mucosa not red , drainage- none, tonsils- atrophic Neck- flexible , trachea midline, no stridor , thyroid nl, carotid no bruit Chest - symmetrical excursion , unlabored           Heart/CV- RRR , no murmur , no gallop  , no rub, nl s1 s2                           - JVD- none ,  edema- none, stasis changes- none, varices- none           Lung- clear to P&A, wheeze- none, cough- none , dullness-none, rub- none           Chest wall-  Abd-  Br/ Gen/ Rectal- Not done, not indicated Extrem- cyanosis- none, clubbing, none, atrophy- none, strength- nl Neuro- grossly intact to observation

## 2012-09-14 NOTE — Assessment & Plan Note (Signed)
He desaturated on room air for his sleep study without CPAP. We will need to watch for significant lung disease.

## 2012-09-14 NOTE — Assessment & Plan Note (Signed)
Mild obstructive apnea. He had done very well with CPAP stops snoring and corrected oxygen desaturation. His old machine no longer works well Plan-work with DME company on CPAP comfort, machine replacement. Try Sonata for help with sleep onset and maintenance.

## 2012-10-02 ENCOUNTER — Encounter (HOSPITAL_COMMUNITY): Payer: Self-pay | Admitting: Pharmacy Technician

## 2012-10-05 ENCOUNTER — Encounter (HOSPITAL_COMMUNITY)
Admission: RE | Admit: 2012-10-05 | Discharge: 2012-10-05 | Disposition: A | Discharge status: Home / Self Care | Payer: Medicare Other | Source: Ambulatory Visit | Attending: Anesthesiology | Admitting: Anesthesiology

## 2012-10-05 ENCOUNTER — Encounter (HOSPITAL_COMMUNITY): Payer: Self-pay

## 2012-10-05 ENCOUNTER — Encounter (HOSPITAL_COMMUNITY)
Admission: RE | Admit: 2012-10-05 | Discharge: 2012-10-05 | Disposition: A | Discharge status: Home / Self Care | Payer: Medicare Other | Source: Ambulatory Visit | Attending: Orthopedic Surgery | Admitting: Orthopedic Surgery

## 2012-10-05 ENCOUNTER — Other Ambulatory Visit: Payer: Self-pay | Admitting: Physician Assistant

## 2012-10-05 HISTORY — DX: Personal history of other medical treatment: Z92.89

## 2012-10-05 HISTORY — DX: Malignant (primary) neoplasm, unspecified: C80.1

## 2012-10-05 HISTORY — DX: Anxiety disorder, unspecified: F41.9

## 2012-10-05 LAB — SURGICAL PCR SCREEN: MRSA, PCR: NEGATIVE

## 2012-10-05 LAB — CBC
MCHC: 34.1 g/dL (ref 30.0–36.0)
Platelets: 131 10*3/uL — ABNORMAL LOW (ref 150–400)
RDW: 12.7 % (ref 11.5–15.5)

## 2012-10-05 LAB — BASIC METABOLIC PANEL
BUN: 13 mg/dL (ref 6–23)
Calcium: 9.3 mg/dL (ref 8.4–10.5)
Creatinine, Ser: 0.9 mg/dL (ref 0.50–1.35)
GFR calc Af Amer: 89 mL/min — ABNORMAL LOW (ref >90)
GFR calc non Af Amer: 77 mL/min — ABNORMAL LOW (ref >90)

## 2012-10-05 MED ORDER — CHLORHEXIDINE GLUCONATE 4 % EX LIQD: 60.0000 mL | Freq: Once | CUTANEOUS | Status: DC

## 2012-10-05 MED ORDER — SODIUM CHLORIDE 0.9 % IV SOLN: INTRAVENOUS | Status: DC

## 2012-10-05 MED ORDER — ACETAMINOPHEN 10 MG/ML IV SOLN
1000.0000 mg | Freq: Four times a day (QID) | INTRAVENOUS | Status: DC
  Administered 2012-10-06: 1000 mg via INTRAVENOUS
  Filled 2012-10-05 (×4): qty 100

## 2012-10-05 NOTE — Pre-Procedure Instructions (Signed)
22 Scott [name]  10/05/2012   Your procedure is scheduled on:  10/06/2012  Report to [name] at 5:30 AM.  Call this number if you have problems the morning of surgery: [phone]   Remember:   Do not eat food or drink liquid :After Midnight. TONIGHT      Take these medicines the morning of surgery with A SIP OF WATER: PROSCAR, METOPROLOL, ZANTAC   Do not wear jewelry  Do not wear lotions, powders, or perfumes. You may wear deodorant.  Men may shave face and neck.  Do not bring valuables to the hospital.  Contacts, dentures or bridgework may not be worn into surgery.  Leave suitcase in the car. After surgery it may be brought to your room.  For patients admitted to the hospital, checkout time is 11:00 AM the day of discharge.   Patients discharged the day of surgery will not be allowed to drive home.  Name and phone number of your driver: with wife  Special Instructions: Shower using CHG 2 nights before surgery and the night before surgery.  If you shower the day of surgery use CHG.  Use special wash - you have one bottle of CHG for all showers.  You should use approximately 1/3 of the bottle for each shower.   Please read over the following fact sheets that you were given: Pain Booklet, Coughing and Deep Breathing, MRSA Information and Surgical Site Infection Prevention

## 2012-10-06 ENCOUNTER — Ambulatory Visit (HOSPITAL_COMMUNITY): Payer: Medicare Other | Admitting: Anesthesiology

## 2012-10-06 ENCOUNTER — Encounter (HOSPITAL_COMMUNITY): Payer: Self-pay | Admitting: *Deleted

## 2012-10-06 ENCOUNTER — Encounter (HOSPITAL_COMMUNITY)
Admission: RE | Disposition: A | Discharge status: Home / Self Care | Payer: Self-pay | Source: Ambulatory Visit | Attending: Orthopedic Surgery

## 2012-10-06 ENCOUNTER — Encounter (HOSPITAL_COMMUNITY): Payer: Self-pay | Admitting: Anesthesiology

## 2012-10-06 ENCOUNTER — Ambulatory Visit (HOSPITAL_COMMUNITY)
Admission: RE | Admit: 2012-10-06 | Discharge: 2012-10-06 | Disposition: A | Discharge status: Home / Self Care | Payer: Medicare Other | Source: Ambulatory Visit | Attending: Orthopedic Surgery | Admitting: Orthopedic Surgery

## 2012-10-06 DIAGNOSIS — Z01818 Encounter for other preprocedural examination: Secondary | ICD-10-CM | POA: Insufficient documentation

## 2012-10-06 DIAGNOSIS — W2209XA Striking against other stationary object, initial encounter: Secondary | ICD-10-CM | POA: Insufficient documentation

## 2012-10-06 DIAGNOSIS — I10 Essential (primary) hypertension: Secondary | ICD-10-CM | POA: Insufficient documentation

## 2012-10-06 DIAGNOSIS — Z01812 Encounter for preprocedural laboratory examination: Secondary | ICD-10-CM | POA: Insufficient documentation

## 2012-10-06 DIAGNOSIS — D1630 Benign neoplasm of short bones of unspecified lower limb: Secondary | ICD-10-CM | POA: Insufficient documentation

## 2012-10-06 DIAGNOSIS — S93499A Sprain of other ligament of unspecified ankle, initial encounter: Secondary | ICD-10-CM | POA: Insufficient documentation

## 2012-10-06 DIAGNOSIS — I2510 Atherosclerotic heart disease of native coronary artery without angina pectoris: Secondary | ICD-10-CM | POA: Insufficient documentation

## 2012-10-06 HISTORY — PX: CALCANEAL OSTEOTOMY: SHX1281

## 2012-10-06 HISTORY — PX: ACHILLES TENDON SURGERY: SHX542

## 2012-10-06 SURGERY — REPAIR, TENDON, ACHILLES
Anesthesia: Choice | Site: Ankle | Laterality: Right | Wound class: Clean

## 2012-10-06 MED ORDER — ARTIFICIAL TEARS OP OINT
TOPICAL_OINTMENT | OPHTHALMIC | Status: DC | PRN
  Administered 2012-10-06: 1 via OPHTHALMIC

## 2012-10-06 MED ORDER — ONDANSETRON HCL 4 MG/2ML IJ SOLN
INTRAMUSCULAR | Status: DC | PRN
  Administered 2012-10-06: 4 mg via INTRAVENOUS

## 2012-10-06 MED ORDER — MUPIROCIN 2 % EX OINT
TOPICAL_OINTMENT | CUTANEOUS | Status: AC
  Administered 2012-10-06: 1
  Filled 2012-10-06: qty 22

## 2012-10-06 MED ORDER — LIDOCAINE HCL 4 % MT SOLN
OROMUCOSAL | Status: DC | PRN
  Administered 2012-10-06: 4 mL via TOPICAL

## 2012-10-06 MED ORDER — VECURONIUM BROMIDE 10 MG IV SOLR
INTRAVENOUS | Status: DC | PRN
  Administered 2012-10-06: 10 mg via INTRAVENOUS

## 2012-10-06 MED ORDER — PROPOFOL 10 MG/ML IV BOLUS
INTRAVENOUS | Status: DC | PRN
  Administered 2012-10-06: 140 mg via INTRAVENOUS

## 2012-10-06 MED ORDER — BUPIVACAINE HCL (PF) 0.25 % IJ SOLN
INTRAMUSCULAR | Status: AC
  Filled 2012-10-06: qty 30

## 2012-10-06 MED ORDER — FENTANYL CITRATE 0.05 MG/ML IJ SOLN
INTRAMUSCULAR | Status: DC | PRN
  Administered 2012-10-06 (×3): 50 ug via INTRAVENOUS

## 2012-10-06 MED ORDER — EPHEDRINE SULFATE 50 MG/ML IJ SOLN
INTRAMUSCULAR | Status: DC | PRN
  Administered 2012-10-06: 5 mg via INTRAVENOUS
  Administered 2012-10-06: 10 mg via INTRAVENOUS
  Administered 2012-10-06: 5 mg via INTRAVENOUS

## 2012-10-06 MED ORDER — CEFAZOLIN SODIUM 1-5 GM-% IV SOLN
2.0000 g | Freq: Three times a day (TID) | INTRAVENOUS | Status: DC
  Administered 2012-10-06: 2 g via INTRAVENOUS

## 2012-10-06 MED ORDER — CEFAZOLIN SODIUM-DEXTROSE 2-3 GM-% IV SOLR
INTRAVENOUS | Status: AC
  Filled 2012-10-06: qty 50

## 2012-10-06 MED ORDER — ACETAMINOPHEN 10 MG/ML IV SOLN
INTRAVENOUS | Status: AC
  Filled 2012-10-06: qty 100

## 2012-10-06 MED ORDER — NEOSTIGMINE METHYLSULFATE 1 MG/ML IJ SOLN
INTRAMUSCULAR | Status: DC | PRN
  Administered 2012-10-06: 4 mg via INTRAVENOUS

## 2012-10-06 MED ORDER — GLYCOPYRROLATE 0.2 MG/ML IJ SOLN
INTRAMUSCULAR | Status: DC | PRN
  Administered 2012-10-06: .8 mg via INTRAVENOUS

## 2012-10-06 MED ORDER — 0.9 % SODIUM CHLORIDE (POUR BTL) OPTIME
TOPICAL | Status: DC | PRN
  Administered 2012-10-06: 1000 mL

## 2012-10-06 MED ORDER — BUPIVACAINE HCL (PF) 0.25 % IJ SOLN
INTRAMUSCULAR | Status: DC | PRN
  Administered 2012-10-06: 10 mL

## 2012-10-06 MED ORDER — LIDOCAINE HCL (CARDIAC) 20 MG/ML IV SOLN
INTRAVENOUS | Status: DC | PRN
  Administered 2012-10-06: 50 mg via INTRAVENOUS

## 2012-10-06 MED ORDER — ONDANSETRON HCL 4 MG/2ML IJ SOLN: 4.0000 mg | Freq: Once | INTRAMUSCULAR | Status: DC | PRN

## 2012-10-06 MED ORDER — LACTATED RINGERS IV SOLN
INTRAVENOUS | Status: DC | PRN
  Administered 2012-10-06 (×2): via INTRAVENOUS

## 2012-10-06 MED ORDER — MUPIROCIN 2 % EX OINT
TOPICAL_OINTMENT | Freq: Two times a day (BID) | CUTANEOUS | Status: DC
  Filled 2012-10-06: qty 22

## 2012-10-06 MED ORDER — HYDROMORPHONE HCL PF 1 MG/ML IJ SOLN: 0.2500 mg | INTRAMUSCULAR | Status: DC | PRN

## 2012-10-06 SURGICAL SUPPLY — 48 items
BANDAGE ELASTIC 4 VELCRO ST LF (GAUZE/BANDAGES/DRESSINGS) ×2 IMPLANT
BANDAGE ELASTIC 6 VELCRO ST LF (GAUZE/BANDAGES/DRESSINGS) ×2 IMPLANT
BANDAGE GAUZE ELAST BULKY 4 IN (GAUZE/BANDAGES/DRESSINGS) ×2 IMPLANT
BENZOIN TINCTURE PRP APPL 2/3 (GAUZE/BANDAGES/DRESSINGS) IMPLANT
BNDG ESMARK 4X9 LF (GAUZE/BANDAGES/DRESSINGS) ×2 IMPLANT
CLOTH BEACON ORANGE TIMEOUT ST (SAFETY) ×2 IMPLANT
COVER SURGICAL LIGHT HANDLE (MISCELLANEOUS) ×2 IMPLANT
CUFF TOURNIQUET SINGLE 34IN LL (TOURNIQUET CUFF) IMPLANT
CUFF TOURNIQUET SINGLE 44IN (TOURNIQUET CUFF) IMPLANT
DRAPE U-SHAPE 47X51 STRL (DRAPES) ×2 IMPLANT
DRSG ADAPTIC 3X8 NADH LF (GAUZE/BANDAGES/DRESSINGS) ×2 IMPLANT
DRSG PAD ABDOMINAL 8X10 ST (GAUZE/BANDAGES/DRESSINGS) ×2 IMPLANT
DURAPREP 26ML APPLICATOR (WOUND CARE) ×2 IMPLANT
ELECT REM PT RETURN 9FT ADLT (ELECTROSURGICAL) ×2
ELECTRODE REM PT RTRN 9FT ADLT (ELECTROSURGICAL) ×1 IMPLANT
GLOVE BIOGEL PI IND STRL 8 (GLOVE) ×2 IMPLANT
GLOVE BIOGEL PI INDICATOR 8 (GLOVE) ×2
GLOVE ORTHO TXT STRL SZ7.5 (GLOVE) ×4 IMPLANT
GLOVE SURG ORTHO 8.0 STRL STRW (GLOVE) ×4 IMPLANT
GOWN STRL NON-REIN LRG LVL3 (GOWN DISPOSABLE) ×2 IMPLANT
GOWN STRL REIN XL XLG (GOWN DISPOSABLE) ×4 IMPLANT
IMPL SYS BIOCOMP ACH SPEED (Anchor) ×1 IMPLANT
IMPLANT SYS BIOCOMP ACH SPEED (Anchor) ×2 IMPLANT
KIT BASIN OR (CUSTOM PROCEDURE TRAY) ×2 IMPLANT
KIT ROOM TURNOVER OR (KITS) ×2 IMPLANT
NEEDLE HYPO 22GX1.5 SAFETY (NEEDLE) ×2 IMPLANT
NS IRRIG 1000ML POUR BTL (IV SOLUTION) ×2 IMPLANT
PACK ORTHO EXTREMITY (CUSTOM PROCEDURE TRAY) ×2 IMPLANT
PAD ARMBOARD 7.5X6 YLW CONV (MISCELLANEOUS) ×4 IMPLANT
PAD CAST 4YDX4 CTTN HI CHSV (CAST SUPPLIES) ×1 IMPLANT
PADDING CAST COTTON 4X4 STRL (CAST SUPPLIES) ×1
PADDING CAST COTTON 6X4 STRL (CAST SUPPLIES) ×2 IMPLANT
SPLINT PLASTER CAST XFAST 5X30 (CAST SUPPLIES) ×1 IMPLANT
SPLINT PLASTER XFAST SET 5X30 (CAST SUPPLIES) ×1
SPONGE GAUZE 4X4 12PLY (GAUZE/BANDAGES/DRESSINGS) ×2 IMPLANT
SPONGE LAP 18X18 X RAY DECT (DISPOSABLE) ×2 IMPLANT
SUCTION FRAZIER TIP 10 FR DISP (SUCTIONS) ×2 IMPLANT
SUT ETHILON 3 0 PS 1 (SUTURE) ×2 IMPLANT
SUT FIBERWIRE #2 38 T-5 BLUE (SUTURE) ×2
SUT VIC AB 2-0 CT1 27 (SUTURE) ×1
SUT VIC AB 2-0 CT1 TAPERPNT 27 (SUTURE) ×1 IMPLANT
SUTURE FIBERWR #2 38 T-5 BLUE (SUTURE) ×1 IMPLANT
SYR CONTROL 10ML LL (SYRINGE) ×2 IMPLANT
TOWEL OR 17X24 6PK STRL BLUE (TOWEL DISPOSABLE) ×2 IMPLANT
TOWEL OR 17X26 10 PK STRL BLUE (TOWEL DISPOSABLE) ×2 IMPLANT
TUBE CONNECTING 12X1/4 (SUCTIONS) ×2 IMPLANT
WATER STERILE IRR 1000ML POUR (IV SOLUTION) ×2 IMPLANT
YANKAUER SUCT BULB TIP NO VENT (SUCTIONS) ×2 IMPLANT

## 2012-10-06 NOTE — Anesthesia Postprocedure Evaluation (Signed)
  Anesthesia Post-op Note  Patient: Scott [name]  Procedure(s) Performed: Procedure(s) (LRB) with comments: ACHILLES TENDON REPAIR (Right) - REPAIR RIGHT RUPTURE ACHILLES TENDON PRIMARY OPEN/PERCUTANEOUS, EXCISION PARTIAL BONE TALUS/CANCANEUS, REPAIR PARTIAL EXCISION CALCANEUS CALCANEAL OSTEOTOMY (Right)  Patient Location: PACU  Anesthesia Type:General  Level of Consciousness: awake, oriented, sedated and patient cooperative  Airway and Oxygen Therapy: Patient Spontanous Breathing  Post-op Pain: mild  Post-op Assessment: Post-op Vital signs reviewed, Patient's Cardiovascular Status Stable, Respiratory Function Stable, Patent Airway, No signs of Nausea or vomiting and Pain level controlled  Post-op Vital Signs: stable  Complications: No apparent anesthesia complications

## 2012-10-06 NOTE — Anesthesia Preprocedure Evaluation (Addendum)
Anesthesia Evaluation  Patient identified by MRN, date of birth, ID band Patient awake    Reviewed: Allergy & Precautions, H&P , NPO status , Patient's Chart, lab work & pertinent test results  Airway Mallampati: I TM Distance: >3 FB Neck ROM: full    Dental  (+) Teeth Intact and Dental Advidsory Given   Pulmonary shortness of breath and with exertion, sleep apnea ,          Cardiovascular hypertension, + Past MI, + CABG and + DOE Rhythm:regular Rate:Normal     Neuro/Psych    GI/Hepatic GERD-  ,  Endo/Other    Renal/GU      Musculoskeletal   Abdominal   Peds  Hematology   Anesthesia Other Findings   Reproductive/Obstetrics                          Anesthesia Physical Anesthesia Plan  ASA: III  Anesthesia Plan: General   Post-op Pain Management:    Induction: Intravenous  Airway Management Planned: Oral ETT  Additional Equipment:   Intra-op Plan:   Post-operative Plan: Extubation in OR  Informed Consent: I have reviewed the patients History and Physical, chart, labs and discussed the procedure including the risks, benefits and alternatives for the proposed anesthesia with the patient or authorized representative who has indicated his/her understanding and acceptance.   Dental Advisory Given  Plan Discussed with: CRNA, Anesthesiologist and Surgeon  Anesthesia Plan Comments:        Anesthesia Quick Evaluation

## 2012-10-06 NOTE — H&P (Signed)
Scott [name] is an 78 y.o. male.   Chief Complaint: Right Achilles Tendon Rupture HPI: 78yo m has had a history of right heel pain treated in the office for retrocalcaneal bursitis had an acute injury on 09/22/12 stepped down on a shovel and felt immediate pain in the right heel and achilles.  Seen in the office that day sent for MRI showing a high grade nearly complete insertional tear of the achilles tendon.    Past Medical History  Diagnosis Date  . SOB (shortness of breath)   . Hypercholesterolemia   . GERD (gastroesophageal reflux disease)   . Sleep apnea     with use of CPAP  . Cranial neuropathy     resolved   . Hypertension   . CAD (coronary artery disease)     s/p CABG in 2007  . Bronchitis   . BPH (benign prostatic hyperplasia)   . Blind   . Thrombocytopenia     possibly due to Lovenox  . Obesity   . Anxiety     pt. reports that he has occas. nervous episodes   . H/O echocardiogram     last echo, stress test 05/2012- pt. followed by [facility]   . Cancer     melanoma - removed L ear - 2010  . Generalized OA   . Osteoarthritis     End-stage osteoarthritis, right hip  . Anemia     Acute blood loss anemia secondary to surgery, followed fr/ Hip replacement     Past Surgical History  Procedure Date  . Coronary artery bypass graft 2007    LIMA GRAFT TO THE LAD, SAPHENOUS VEIN GRAFT TO THE FIRST OBTUSE MARIGNAL, SAPHENOUS VEIN GRAFT TO THE SECOND OBTUSE MARGINAL AND A SAPHENOUS VEIN GRAFT TO THE ACUTE MARGINAL AND POSTERIOR LATERAL BRANCH  . Total hip arthroplasty     right  . Appendectomy   . Carpel tunnel surgery-bilateral     both hands   . Knee 11/12    left arthroscopic  . Cardiac catheterization 12/15/2005    NORMAL. EF 60%, angioplasty 1995  . Artery biopsy     temporal - L side- wnl, double vision treated /w prednisone - 1991    Family History  Problem Relation Age of Onset  . Prostate cancer Brother   . Heart disease Brother   . Heart disease Brother     Social History:  reports that he has never smoked. He does not have any smokeless tobacco history on file. He reports that he does not drink alcohol or use illicit drugs.  Allergies:  Allergies  Allergen Reactions  . Amlodipine     Swelling of feet and legs  . Codeine Nausea Only  . Prednisone     Patient has tolerated doses for colds. Thinks it caused swelling in the past  . Shellfish Allergy Other (See Comments)    N&V, shaking    Medications Prior to Admission  Medication Sig Dispense Refill  . aspirin 81 MG tablet Take 81 mg by mouth daily.        Marland Kitchen doxazosin (CARDURA) 4 MG tablet Take 4 mg by mouth at bedtime.       . finasteride (PROSCAR) 5 MG tablet Take 5 mg by mouth daily before breakfast.       . fish oil-omega-3 fatty acids 1000 MG capsule Take 1 g by mouth every other day.       . lovastatin (MEVACOR) 20 MG tablet Take 20 mg by mouth at  bedtime.        . metoprolol (LOPRESSOR) 50 MG tablet Take 25 mg by mouth 2 (two) times daily.      . metoprolol tartrate (LOPRESSOR) 25 MG tablet Take 25 mg by mouth 2 (two) times daily.       . ranitidine (ZANTAC) 75 MG tablet Take 75 mg by mouth daily before breakfast.      . Tamsulosin HCl (FLOMAX) 0.4 MG CAPS Take 0.4 mg by mouth daily after supper.      . triamterene-hydrochlorothiazide (MAXZIDE) 75-50 MG per tablet Take 1 tablet by mouth daily before breakfast.       . triazolam (HALCION) 0.25 MG tablet Take 1 tablet (0.25 mg total) by mouth at bedtime as needed (If needed for sleep).  30 tablet  5  . zaleplon (SONATA) 5 MG capsule Take 5 mg by mouth at bedtime. As needed for sleep        Results for orders placed during the hospital encounter of 10/05/12 (from the past 48 hour(s))  BASIC METABOLIC PANEL     Status: Abnormal   Collection Time   10/05/12 12:19 PM      Component Value Range Comment   Sodium 139  135 - 145 mEq/L    Potassium 3.4 (*) 3.5 - 5.1 mEq/L    Chloride 97  96 - 112 mEq/L    CO2 32  19 - 32 mEq/L     Glucose, Bld 102 (*) 70 - 99 mg/dL    BUN 13  6 - 23 mg/dL    Creatinine, Ser 1.61  0.50 - 1.35 mg/dL    Calcium 9.3  8.4 - 09.6 mg/dL    GFR calc non Af Amer 77 (*) >90 mL/min    GFR calc Af Amer 89 (*) >90 mL/min   CBC     Status: Abnormal   Collection Time   10/05/12 12:19 PM      Component Value Range Comment   WBC 8.9  4.0 - 10.5 K/uL    RBC 4.60  4.22 - 5.81 MIL/uL    Hemoglobin 15.3  13.0 - 17.0 g/dL    HCT 04.5  40.9 - 81.1 %    MCV 97.6  78.0 - 100.0 fL    MCH 33.3  26.0 - 34.0 pg    MCHC 34.1  30.0 - 36.0 g/dL    RDW 91.4  78.2 - 95.6 %    Platelets 131 (*) 150 - 400 K/uL   SURGICAL PCR SCREEN     Status: Abnormal   Collection Time   10/05/12 12:20 PM      Component Value Range Comment   MRSA, PCR NEGATIVE  NEGATIVE    Staphylococcus aureus POSITIVE (*) NEGATIVE    Dg Chest 2 View  10/05/2012  *RADIOLOGY REPORT*  Clinical Data: Preop  CHEST - 2 VIEW  Comparison: 12/25/2005  Findings: Cardiomediastinal silhouette is stable.  Status post CABG.  No acute infiltrate or pleural effusion.  No pulmonary edema.  Degenerative changes bilateral shoulders.  Mild degenerative changes thoracic spine.  IMPRESSION: No active disease.  No significant change.   Original Report Authenticated By: Natasha Mead, M.D.     Review of Systems  Constitutional: Negative for fever, chills, weight loss, malaise/fatigue and diaphoresis.  HENT: Negative for ear pain, congestion and sore throat.   Eyes: Negative for blurred vision, double vision and pain.  Respiratory: Positive for shortness of breath. Negative for cough and wheezing.   Cardiovascular: Negative for  chest pain and palpitations.  Gastrointestinal: Positive for constipation. Negative for nausea, vomiting, abdominal pain, diarrhea and blood in stool.  Genitourinary: Negative for dysuria.  Musculoskeletal: Positive for myalgias.  Skin: Negative for itching and rash.  Neurological: Negative for dizziness, tingling, loss of consciousness,  weakness and headaches.  Endo/Heme/Allergies: Does not bruise/bleed easily.  Psychiatric/Behavioral: Negative for depression.    Blood pressure 142/80, pulse 63, temperature 98.2 F (36.8 C), temperature source Oral, resp. rate 18, SpO2 95.00%. Physical Exam  Constitutional: He is oriented to person, place, and time. He appears well-developed and well-nourished. No distress.  HENT:  Head: Normocephalic and atraumatic.  Eyes: EOM are normal. Pupils are equal, round, and reactive to light.  Neck: Normal range of motion. Neck supple.  Cardiovascular: Normal rate, regular rhythm and normal heart sounds.   Respiratory: Effort normal and breath sounds normal. No respiratory distress. He has no wheezes.  GI: Soft. Bowel sounds are normal. He exhibits no distension. There is no tenderness.  Musculoskeletal: He exhibits tenderness.       Right foot: He exhibits decreased range of motion and tenderness.  Neurological: He is alert and oriented to person, place, and time.  Skin: Skin is warm and dry. No erythema.  Psychiatric: He has a normal mood and affect.     Assessment/Plan Right Achilles Tendon Rupture  Risks and benefits of right achilles tendon repair discussed and patient wishes to proceed.  This will be planned for 24 hr obs at [facility].  He has obtained clearance from PCP as well as cardiologist Dr. [place].  Block time 1.5hrs, prone position, choice anshtetic.  Given Norco for pain control post op.  Margart Sickles 10/06/2012, 7:11 AM

## 2012-10-06 NOTE — Transfer of Care (Signed)
Immediate Anesthesia Transfer of Care Note  Patient: Scott [name]  Procedure(s) Performed: Procedure(s) (LRB) with comments: ACHILLES TENDON REPAIR (Right) - REPAIR RIGHT RUPTURE ACHILLES TENDON PRIMARY OPEN/PERCUTANEOUS, EXCISION PARTIAL BONE TALUS/CANCANEUS, REPAIR PARTIAL EXCISION CALCANEUS CALCANEAL OSTEOTOMY (Right)  Patient Location: PACU  Anesthesia Type:General  Level of Consciousness: awake, alert  and oriented  Airway & Oxygen Therapy: Patient Spontanous Breathing and Patient connected to nasal cannula oxygen  Post-op Assessment: Report given to PACU RN, Post -op Vital signs reviewed and stable and Patient moving all extremities X 4  Post vital signs: Reviewed and stable  Complications: No apparent anesthesia complications

## 2012-10-06 NOTE — Brief Op Note (Signed)
10/06/2012  9:47 AM  PATIENT:  Scott [name]  78 y.o. male  PRE-OPERATIVE DIAGNOSIS:  Right Achilles Tendon Rupture. Exostosis, pOST-Traumatic Seroma  POST-OPERATIVE DIAGNOSIS:  Right Achilles Tendon Rupture. Exostosis, pOST-Traumatic Seroma  PROCEDURE:  Procedure(s) (LRB) with comments: ACHILLES TENDON REPAIR (Right) - REPAIR RIGHT RUPTURE ACHILLES TENDON PRIMARY OPEN/PERCUTANEOUS, EXCISION PARTIAL BONE TALUS/CANCANEUS, REPAIR PARTIAL EXCISION CALCANEUS CALCANEAL OSTEOTOMY (Right)  SURGEON:  Surgeon(s) and Role:    * W D Carloyn Manner., MD - Primary  PHYSICIAN ASSISTANT:   ASSISTANTS: Margart Sickles, PA-C   ANESTHESIA:   epidural  EBL:  Total I/O In: 1500 [I.V.:1500] Out: -   BLOOD ADMINISTERED:none  DRAINS: none   LOCAL MEDICATIONS USED:  MARCAINE     SPECIMEN:  No Specimen  DISPOSITION OF SPECIMEN:  N/A  COUNTS:  YES  TOURNIQUET:   Total Tourniquet Time Documented: Thigh (Right) - 76 minutes  DICTATION: .Other Dictation: Dictation Number   PLAN OF CARE: Discharge to home after PACU  PATIENT DISPOSITION:  PACU - hemodynamically stable.   Delay start of Pharmacological VTE agent (>24hrs) due to surgical blood loss or risk of bleeding: not applicable

## 2012-10-06 NOTE — Progress Notes (Signed)
Compression dressing clean and intact to right foot.  Good capillary refill and toes pink & warm.

## 2012-10-06 NOTE — Anesthesia Procedure Notes (Signed)
Procedure Name: Intubation Date/Time: 10/06/2012 7:58 AM Performed by: Carmela Rima Pre-anesthesia Checklist: Patient identified, Timeout performed, Emergency Drugs available, Suction available and Patient being monitored Patient Re-evaluated:Patient Re-evaluated prior to inductionOxygen Delivery Method: Circle system utilized Preoxygenation: Pre-oxygenation with 100% oxygen Intubation Type: IV induction Ventilation: Mask ventilation without difficulty Laryngoscope Size: Mac and 3 Grade View: Grade II Tube type: Oral Tube size: 7.5 mm Number of attempts: 1 Airway Equipment and Method: LTA kit utilized Placement Confirmation: ETT inserted through vocal cords under direct vision,  positive ETCO2 and breath sounds checked- equal and bilateral Secured at: 22 cm Tube secured with: Tape Dental Injury: Teeth and Oropharynx as per pre-operative assessment

## 2012-10-06 NOTE — Preoperative (Signed)
Beta Blockers   Reason not to administer Beta Blockers:Not Applicable 

## 2012-10-06 NOTE — Op Note (Signed)
NAME:  Scott [name], Scott [name] NO.:  [id number]  MEDICAL RECORD NO.:  [id number]  LOCATION:  [facility]                         FACILITY:  [facility]  PHYSICIAN:  Dyke Brackett, M.D.    DATE OF BIRTH:  03-Aug-1928  DATE OF PROCEDURE:  10/06/2012 DATE OF DISCHARGE:  10/06/2012                              OPERATIVE REPORT   INDICATIONS:  A 78 year old active individual with an avulsion of his Achilles tendon off the bone with significant separation of the tendon from the bone with prior history of retrocalcaneal bursitis and a prominent pump bump found to be amenable to overnight hospitalization, repair of the tendon and excision of the pump bump.  PREOPERATIVE DIAGNOSIS:  Avulsion, Achilles tendon of right ankle with bony exostosis, calcaneus.  POSTOPERATIVE DIAGNOSIS:  Avulsion, Achilles tendon of right ankle with bony exostosis, calcaneus.  OPERATION: 1. Achilles tendon repair. 2. Exostosis (pump bump, partial osteoectomy calcaneus).  SURGEON:  Dyke Brackett, MD  ASSISTANT:  Margart Sickles, PA-C  TOURNIQUET TIME:  75 minutes.  DESCRIPTION OF PROCEDURE:  Prone positioning, straight skin incision made.  The prominence on the bone was based laterally.  He had a very prominent pump bump on the lateral side.  Due to the fact he had avulsed the tendon close to if not exactly, not particularly right off the bony insertion, but there was a small stump of tendon remaining.  We elevated that, performed an osteoectomy on the pump bump.  Given the patient's age in the 46, tissue was of reasonably good quality.  We elected to fix this with push locks.  We placed 2 push locks through bone holes distally in the calcaneus.  We then placed the fiber tapes through the stump distally.  We then loaded the additional suture on each fiber tape distal to proximal, back to distal.  We used 6 sutures to set the tension on the tendon and then we placed the fiber tapes proximally and then  imbricated them again distally using 2 separate Swivel locks to imbricate the repair.  This made for an extremely durable, excellent anatomic repair.  There was some slight overlap of the tendon relative to the stump but we could bring the patient past neutral without too much difficulty.  The edges of the tendon were oversewn with 0 Vicryl.  The subcutaneous tissues with 2-0 Vicryl and the skin with 3-0 interrupted nylon.  Light compressive bulky dressing.  Posterior splint applied.  Followup plan for the patient to come back to the office for suture removal and to be admitted overnight.     Dyke Brackett, M.D.     WDC/MEDQ  D:  10/06/2012  T:  10/06/2012  Job:  [id number]

## 2012-10-09 ENCOUNTER — Encounter (HOSPITAL_COMMUNITY): Payer: Self-pay | Admitting: Orthopedic Surgery

## 2012-11-09 ENCOUNTER — Encounter (HOSPITAL_BASED_OUTPATIENT_CLINIC_OR_DEPARTMENT_OTHER): Payer: Self-pay | Admitting: *Deleted

## 2012-11-09 ENCOUNTER — Emergency Department (HOSPITAL_BASED_OUTPATIENT_CLINIC_OR_DEPARTMENT_OTHER)
Admission: EM | Admit: 2012-11-09 | Discharge: 2012-11-09 | Disposition: A | Discharge status: Home / Self Care | Payer: Medicare Other | Attending: Emergency Medicine | Admitting: Emergency Medicine

## 2012-11-09 ENCOUNTER — Emergency Department (HOSPITAL_BASED_OUTPATIENT_CLINIC_OR_DEPARTMENT_OTHER): Payer: Medicare Other

## 2012-11-09 DIAGNOSIS — Z8589 Personal history of malignant neoplasm of other organs and systems: Secondary | ICD-10-CM | POA: Insufficient documentation

## 2012-11-09 DIAGNOSIS — E669 Obesity, unspecified: Secondary | ICD-10-CM | POA: Insufficient documentation

## 2012-11-09 DIAGNOSIS — G8918 Other acute postprocedural pain: Secondary | ICD-10-CM | POA: Insufficient documentation

## 2012-11-09 DIAGNOSIS — Z96649 Presence of unspecified artificial hip joint: Secondary | ICD-10-CM | POA: Insufficient documentation

## 2012-11-09 DIAGNOSIS — T819XXA Unspecified complication of procedure, initial encounter: Secondary | ICD-10-CM

## 2012-11-09 DIAGNOSIS — E7800 Pure hypercholesterolemia, unspecified: Secondary | ICD-10-CM | POA: Insufficient documentation

## 2012-11-09 DIAGNOSIS — M1990 Unspecified osteoarthritis, unspecified site: Secondary | ICD-10-CM | POA: Insufficient documentation

## 2012-11-09 DIAGNOSIS — L03115 Cellulitis of right lower limb: Secondary | ICD-10-CM

## 2012-11-09 DIAGNOSIS — Z79899 Other long term (current) drug therapy: Secondary | ICD-10-CM | POA: Insufficient documentation

## 2012-11-09 DIAGNOSIS — H543 Unqualified visual loss, both eyes: Secondary | ICD-10-CM | POA: Insufficient documentation

## 2012-11-09 DIAGNOSIS — Z8669 Personal history of other diseases of the nervous system and sense organs: Secondary | ICD-10-CM | POA: Insufficient documentation

## 2012-11-09 DIAGNOSIS — R05 Cough: Secondary | ICD-10-CM | POA: Insufficient documentation

## 2012-11-09 DIAGNOSIS — Z7982 Long term (current) use of aspirin: Secondary | ICD-10-CM | POA: Insufficient documentation

## 2012-11-09 DIAGNOSIS — I2510 Atherosclerotic heart disease of native coronary artery without angina pectoris: Secondary | ICD-10-CM | POA: Insufficient documentation

## 2012-11-09 DIAGNOSIS — Z862 Personal history of diseases of the blood and blood-forming organs and certain disorders involving the immune mechanism: Secondary | ICD-10-CM | POA: Insufficient documentation

## 2012-11-09 DIAGNOSIS — K219 Gastro-esophageal reflux disease without esophagitis: Secondary | ICD-10-CM | POA: Insufficient documentation

## 2012-11-09 DIAGNOSIS — G4730 Sleep apnea, unspecified: Secondary | ICD-10-CM | POA: Insufficient documentation

## 2012-11-09 DIAGNOSIS — R509 Fever, unspecified: Secondary | ICD-10-CM | POA: Insufficient documentation

## 2012-11-09 DIAGNOSIS — F411 Generalized anxiety disorder: Secondary | ICD-10-CM | POA: Insufficient documentation

## 2012-11-09 DIAGNOSIS — R059 Cough, unspecified: Secondary | ICD-10-CM | POA: Insufficient documentation

## 2012-11-09 DIAGNOSIS — Z951 Presence of aortocoronary bypass graft: Secondary | ICD-10-CM | POA: Insufficient documentation

## 2012-11-09 DIAGNOSIS — L02419 Cutaneous abscess of limb, unspecified: Secondary | ICD-10-CM | POA: Insufficient documentation

## 2012-11-09 DIAGNOSIS — J209 Acute bronchitis, unspecified: Secondary | ICD-10-CM | POA: Insufficient documentation

## 2012-11-09 DIAGNOSIS — Z9889 Other specified postprocedural states: Secondary | ICD-10-CM | POA: Insufficient documentation

## 2012-11-09 DIAGNOSIS — I10 Essential (primary) hypertension: Secondary | ICD-10-CM | POA: Insufficient documentation

## 2012-11-09 LAB — BASIC METABOLIC PANEL
BUN: 15 mg/dL (ref 6–23)
CO2: 29 mEq/L (ref 19–32)
Chloride: 98 mEq/L (ref 96–112)
GFR calc Af Amer: 63 mL/min — ABNORMAL LOW (ref >90)
Potassium: 3.5 mEq/L (ref 3.5–5.1)

## 2012-11-09 LAB — CBC WITH DIFFERENTIAL/PLATELET
Basophils Absolute: 0 10*3/uL (ref 0.0–0.1)
Eosinophils Relative: 1 % (ref 0–5)
HCT: 43.7 % (ref 39.0–52.0)
Hemoglobin: 15.2 g/dL (ref 13.0–17.0)
Lymphocytes Relative: 12 % (ref 12–46)
Monocytes Relative: 7 % (ref 3–12)
Neutro Abs: 6.1 10*3/uL (ref 1.7–7.7)
RBC: 4.52 MIL/uL (ref 4.22–5.81)
RDW: 12.4 % (ref 11.5–15.5)
WBC: 7.6 10*3/uL (ref 4.0–10.5)

## 2012-11-09 LAB — SEDIMENTATION RATE: Sed Rate: 19 mm/hr — ABNORMAL HIGH (ref 0–16)

## 2012-11-09 MED ORDER — ACETAMINOPHEN 325 MG PO TABS
650.0000 mg | ORAL_TABLET | Freq: Four times a day (QID) | ORAL | Status: DC | PRN
  Administered 2012-11-09: 650 mg via ORAL
  Filled 2012-11-09: qty 2

## 2012-11-09 MED ORDER — VANCOMYCIN HCL IN DEXTROSE 1-5 GM/200ML-% IV SOLN
1000.0000 mg | Freq: Once | INTRAVENOUS | Status: AC
  Administered 2012-11-09: 1000 mg via INTRAVENOUS
  Filled 2012-11-09: qty 200

## 2012-11-09 MED ORDER — SULFAMETHOXAZOLE-TRIMETHOPRIM 800-160 MG PO TABS: 1.0000 | ORAL_TABLET | Freq: Two times a day (BID) | ORAL | Status: DC

## 2012-11-09 MED ORDER — IBUPROFEN 800 MG PO TABS: 800.0000 mg | ORAL_TABLET | Freq: Once | ORAL | Status: DC

## 2012-11-09 NOTE — ED Notes (Signed)
MD at bedside. 

## 2012-11-09 NOTE — ED Notes (Signed)
Patient transported to X-ray 

## 2012-11-09 NOTE — ED Provider Notes (Signed)
History     CSN: [id number]  Arrival date & time 11/09/12  1904   First MD Initiated Contact with Patient 11/09/12 1916      Chief Complaint  Patient presents with  . Leg Pain     Patient is a 78 y.o. male presenting with leg pain. The history is provided by the patient.  Leg Pain  The incident occurred yesterday. The incident occurred at home. There was no injury mechanism. Pain location: right lower leg. The pain is mild. The pain has been constant since onset. Pertinent negatives include no numbness. The symptoms are aggravated by palpation. He has tried rest for the symptoms. The treatment provided mild relief.  Pt reports he had surgery to his achilles tendon in early November.  He reports he has been wearing cast which was just removed earlier this week.  He reports over past 24 hours he has noticed pain/redness and swelling to the right LE.  No new trauma.  He does report fever He reports mild cough but no vomiting/diarrhea.  He has no h/o DVT No cp is reported  Past Medical History  Diagnosis Date  . SOB (shortness of breath)   . Hypercholesterolemia   . GERD (gastroesophageal reflux disease)   . Sleep apnea     with use of CPAP  . Cranial neuropathy     resolved   . Hypertension   . CAD (coronary artery disease)     s/p CABG in 2007  . Bronchitis   . BPH (benign prostatic hyperplasia)   . Blind   . Thrombocytopenia     possibly due to Lovenox  . Obesity   . Anxiety     pt. reports that he has occas. nervous episodes   . H/O echocardiogram     last echo, stress test 05/2012- pt. followed by [facility]   . Cancer     melanoma - removed L ear - 2010  . Generalized OA   . Osteoarthritis     End-stage osteoarthritis, right hip  . Anemia     Acute blood loss anemia secondary to surgery, followed fr/ Hip replacement     Past Surgical History  Procedure Date  . Coronary artery bypass graft 2007    LIMA GRAFT TO THE LAD, SAPHENOUS VEIN GRAFT TO THE FIRST OBTUSE  MARIGNAL, SAPHENOUS VEIN GRAFT TO THE SECOND OBTUSE MARGINAL AND A SAPHENOUS VEIN GRAFT TO THE ACUTE MARGINAL AND POSTERIOR LATERAL BRANCH  . Total hip arthroplasty     right  . Appendectomy   . Carpel tunnel surgery-bilateral     both hands   . Knee 11/12    left arthroscopic  . Cardiac catheterization 12/15/2005    NORMAL. EF 60%, angioplasty 1995  . Artery biopsy     temporal - L side- wnl, double vision treated /w prednisone - 1991  . Achilles tendon surgery 10/06/2012    Procedure: ACHILLES TENDON REPAIR;  Surgeon: Thera Flake., MD;  Location: [facility];  Service: Orthopedics;  Laterality: Right;  REPAIR RIGHT RUPTURE ACHILLES TENDON PRIMARY OPEN/PERCUTANEOUS, EXCISION PARTIAL BONE TALUS/CANCANEUS, REPAIR PARTIAL EXCISION CALCANEUS  . Calcaneal osteotomy 10/06/2012    Procedure: CALCANEAL OSTEOTOMY;  Surgeon: Thera Flake., MD;  Location: [facility] OR;  Service: Orthopedics;  Laterality: Right;    Family History  Problem Relation Age of Onset  . Prostate cancer Brother   . Heart disease Brother   . Heart disease Brother     History  Substance Use Topics  .  Smoking status: Never Smoker   . Smokeless tobacco: Not on file  . Alcohol Use: No      Review of Systems  Constitutional: Positive for fever.  Respiratory: Positive for cough.   Gastrointestinal: Negative for vomiting.  Musculoskeletal: Negative for back pain.  Skin: Positive for color change and wound.  Neurological: Negative for weakness and numbness.  All other systems reviewed and are negative.    Allergies  Amlodipine; Codeine; Prednisone; and Shellfish allergy  Home Medications   Current Outpatient Rx  Name  Route  Sig  Dispense  Refill  . ASPIRIN 81 MG PO TABS   Oral   Take 81 mg by mouth daily.           Marland Kitchen DOXAZOSIN MESYLATE 4 MG PO TABS   Oral   Take 4 mg by mouth at bedtime.          Marland Kitchen FINASTERIDE 5 MG PO TABS   Oral   Take 5 mg by mouth daily before breakfast.          . OMEGA-3 FATTY  ACIDS 1000 MG PO CAPS   Oral   Take 1 g by mouth every other day.          Marland Kitchen LOVASTATIN 20 MG PO TABS   Oral   Take 20 mg by mouth at bedtime.           Marland Kitchen METOPROLOL TARTRATE 50 MG PO TABS   Oral   Take 25 mg by mouth 2 (two) times daily.         Marland Kitchen METOPROLOL TARTRATE 25 MG PO TABS   Oral   Take 25 mg by mouth 2 (two) times daily.          Marland Kitchen RANITIDINE HCL 75 MG PO TABS   Oral   Take 75 mg by mouth daily before breakfast.         . TAMSULOSIN HCL 0.4 MG PO CAPS   Oral   Take 0.4 mg by mouth daily after supper.         . TRIAMTERENE-HCTZ 75-50 MG PO TABS   Oral   Take 1 tablet by mouth daily before breakfast.          . TRIAZOLAM 0.25 MG PO TABS   Oral   Take 1 tablet (0.25 mg total) by mouth at bedtime as needed (If needed for sleep).   30 tablet   5   . ZALEPLON 5 MG PO CAPS   Oral   Take 5 mg by mouth at bedtime. As needed for sleep           BP 145/66  Pulse 77  Temp 101.2 F (38.4 C) (Oral)  Resp 20  SpO2 97% BP 144/65  Pulse 89  Temp 99.3 F (37.4 C) (Oral)  Resp 16  SpO2 94%  Physical Exam CONSTITUTIONAL: Well developed/well nourished HEAD AND FACE: Normocephalic/atraumatic EYES: EOMI/PERRL ENMT: Mucous membranes moist NECK: supple no meningeal signs SPINE:entire spine nontender CV: S1/S2 noted, no murmurs/rubs/gallops noted LUNGS: Lungs are clear to auscultation bilaterally, no apparent distress ABDOMEN: soft, nontender, no rebound or guarding GU:no cva tenderness NEURO: Pt is awake/alert, moves all extremitiesx4 EXTREMITIES: pulses normal, full ROM Healed incision noted to right LE along the path of achilles tendon. There is localized erythema in that location, he has tenderness in that location and he has small amt of discharge.  No active bleeding.  He has localized pitting edema to the right LE No crepitance.  No fluctuance.  No pus is noted at wound SKIN: warm PSYCH: no abnormalities of mood noted  ED Course   Procedures   Labs Reviewed  CBC WITH DIFFERENTIAL - Abnormal; Notable for the following:    Platelets 99 (*)     Neutrophils Relative 80 (*)     All other components within normal limits  BASIC METABOLIC PANEL - Abnormal; Notable for the following:    Glucose, Bld 113 (*)     GFR calc non Af Amer 54 (*)     GFR calc Af Amer 63 (*)     All other components within normal limits  SEDIMENTATION RATE - Abnormal; Notable for the following:    Sed Rate 19 (*)     All other components within normal limits   Dg Ankle Complete Right  11/09/2012  *RADIOLOGY REPORT*  Clinical Data: Leg pain.  Status post Achilles tendon surgery in November.  RIGHT ANKLE - COMPLETE 3+ VIEW  Comparison: MRI of the right ankle 09/28/2012  Findings: Ankle is located.  No acute fracture is identified. Small ossific densities posterior to the calcaneus could reflect chronic calcifications within the Achilles tendon.  There is some thickening of the soft tissues posterior to the calcaneus, in the expected location of the Achilles tendon.  Generalized mild subcutaneous edema is noted about the visualized portion of the lower extremity.  No soft tissue gas.  IMPRESSION: Soft tissue prominence in the region of the Achilles tendon. It is uncertain if this is due to expected postoperative change or acute pathologic thickening.  Infection cannot be excluded by plain film. No soft tissue gas is seen.  Generalized mild subcutaneous edema.   Original Report Authenticated By: Britta Mccreedy, M.D.    US Venous Img Lower Unilateral Right  11/09/2012  *RADIOLOGY REPORT*  Clinical Data: Pain and swelling.  History of right Achilles tendon surgery.   LOWER EXTREMITY VENOUS DUPLEX ULTRASOUND  Technique:  Gray-scale sonography with graded compression, as well as color Doppler and duplex ultrasound were performed to evaluate the deep venous system of the lower extremity from the level of the common femoral vein through the popliteal and proximal  calf veins. Spectral Doppler was utilized to evaluate flow at rest and with distal augmentation maneuvers.  Comparison:  Ankle radiographs 11/09/2012  Findings:  Normal compressibility of the common femoral, superficial femoral, and popliteal veins is demonstrated, as well as the visualized proximal calf veins.  No filling defects to suggest DVT on grayscale or color Doppler imaging.  Doppler waveforms show normal direction of venous flow, normal respiratory phasicity and response to augmentation.  IMPRESSION: No evidence of lower extremity deep vein thrombosis.   Original Report Authenticated By: Britta Mccreedy, M.D.     9:20 PM Pt is s/p right achilles repair with localized erythema/tenderness/fever.  There is no DVT per ultrasound.  I will call his orthopedist for further guidance 9:32 PM Spoke to dr took on call for [facility] We discussed imaging/labs He advises to obtain culture if possible, load with vancomycin, start bactrim and can be seen tomorrow morning in office  Small amt of fluid sent for culture Pt is comfortable with this plan and will followup tomorrow morning  MDM  Nursing notes including past medical history and social history reviewed and considered in documentation xrays reviewed and considered Previous records reviewed and considered - recent surgery notes reviewed Labs/vital reviewed and considered         Joya Gaskins, MD 11/09/12 2210

## 2012-11-09 NOTE — ED Notes (Signed)
Fever. He had surgery on his right foot a month ago.

## 2012-11-09 NOTE — ED Notes (Signed)
MD at bedside giving results and plan of care. 

## 2012-11-12 LAB — WOUND CULTURE

## 2012-11-17 NOTE — ED Notes (Signed)
Patient is being followed by PCP

## 2012-12-07 ENCOUNTER — Ambulatory Visit (INDEPENDENT_AMBULATORY_CARE_PROVIDER_SITE_OTHER): Payer: Medicare Other | Admitting: Internal Medicine

## 2012-12-07 ENCOUNTER — Encounter: Payer: Self-pay | Admitting: Internal Medicine

## 2012-12-07 VITALS — BP 122/80 | HR 80 | Ht 65.0 in | Wt 194.6 lb

## 2012-12-07 DIAGNOSIS — G4733 Obstructive sleep apnea (adult) (pediatric): Secondary | ICD-10-CM

## 2012-12-07 DIAGNOSIS — G4700 Insomnia, unspecified: Secondary | ICD-10-CM

## 2012-12-07 MED ORDER — TRIAZOLAM 0.25 MG PO TABS: 0.2500 mg | ORAL_TABLET | Freq: Every evening | ORAL | Status: DC | PRN

## 2012-12-07 NOTE — Patient Instructions (Addendum)
I am glad you are doing so well with your CPAP. When we get the download result, we will call if we want to recommend any changes.   Please call as needed

## 2012-12-07 NOTE — Progress Notes (Signed)
05/23/12- 79 yo M never smoker Former patient-last seen 2007 for OSA/ AHC- CPAPhas not been serviced in a long time. Wife here NPSG 10/14/02- AHI 15.9/ hr, CPAP titrated to 8, body weight 180 lbs.  He has not used CPAP in 2 years but admits now that he was better off when he wore it. He had taken Halcion in the past but has run out. Usual bedtime 10 or 10:30 PM with prolonged sleep latency and frequent waking after sleep onset before up between 7 and 8 AM. Weight has been stable. 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 Medical history is significant for coronary disease with bypass grafting.  07/06/12- 79 yo M never smoker Former patient-last seen 2007 for OSA/ AHC- CPAP, Rhinitis, complicated by CAD/ CABG. Needs Rx sent to [facility] for new CPAP; wears for approx 6 hours; unsure if losing pressure or not. Needs new CPAP machine. New problem-3 weeks myalgias, cough, sore throat, no fever. Wife had this illness first. Similar illness in the family the last 3 summers- has needed steroids  09/05/12- 79 yo M never smoker Former patient-last seen 2007 for OSA/ AHC- CPAP, Rhinitis, complicated by CAD/ CABG.  wife here Wife had been waking husband because of apnea and loud snore. No longer snores wearing CPAP, likes nasal mask. Disliked Halcion. NPSG 07/30/12- mild obstructive sleep apnea with AHI 6.7 per hour  12/07/12- 79 yo M never smoker followed for OSA/ AHC- CPAP, Rhinitis, complicated by CAD/ CABG.  wife here FOLLOWS FOR: wears CPAP 10/ Advanced every night for about 5-8 hours; pressure working well for patient. Refill for Triazolam He thinks temazepam causes some mild malaise and prefers Halcion/ triazolam. He sleeps much better with medication. We reviewed sleep hygiene issues of sedating medication again with his wife present for her input. Right foot is in boot because of Achilles tendon injury  //  Need ONOX on CPAP room air?//  ROS-see HPI Constitutional:    No-   weight loss, night sweats, fevers, chills, +fatigue, lassitude. HEENT:   No-  headaches, difficulty swallowing, tooth/dental problems, sore throat,       No-  sneezing, itching, ear ache, +nasal congestion, post nasal drip,  CV:  No-   chest pain, orthopnea, PND, +swelling in lower extremities, No-anasarca, dizziness, palpitations Resp: +  shortness of breath with exertion or at rest.              No-   productive cough,  No non-productive cough,  No- coughing up of blood.              No-   change in color of mucus.  No- wheezing.   Skin: No-   rash or lesions. GI:  No-   heartburn, indigestion, abdominal pain, nausea, vomiting,  GU: n. MS:  No-   joint pain or swelling.   Neuro-     nothing unusual Psych:  No- change in mood or affect. No depression or anxiety.  No memory loss.  OBJ- Physical Exam General- Alert, Oriented, Affect-appropriate, Distress- none acute, overweight Skin- rash-none, lesions- none, excoriation- none Lymphadenopathy- none Head- atraumatic            Eyes- Gross vision intact, PERRLA, conjunctivae and secretions clear            Ears- Hearing, canals-normal            Nose- Clear, no-Septal dev, mucus, polyps, erosion, perforation  Throat- Mallampati III-IV , mucosa not red , drainage- none, tonsils- atrophic Neck- flexible , trachea midline, no stridor , thyroid nl, carotid no bruit Chest - symmetrical excursion , unlabored           Heart/CV- RRR , no murmur , no gallop  , no rub, nl s1 s2                           - JVD- none , edema- none, stasis changes- none, varices- none           Lung- clear to P&A, wheeze- none, cough- none , dullness-none, rub- none           Chest wall-  Abd-  Br/ Gen/ Rectal- Not done, not indicated Extrem- cyanosis- none, clubbing, none, atrophy- none, strength- nl Neuro- grossly intact to observation

## 2012-12-08 ENCOUNTER — Encounter (HOSPITAL_COMMUNITY): Payer: Self-pay | Admitting: *Deleted

## 2012-12-08 ENCOUNTER — Encounter (HOSPITAL_COMMUNITY): Payer: Self-pay | Admitting: Respiratory Therapy

## 2012-12-08 ENCOUNTER — Other Ambulatory Visit (HOSPITAL_COMMUNITY): Payer: Self-pay | Admitting: Orthopedic Surgery

## 2012-12-10 MED ORDER — CEFAZOLIN SODIUM-DEXTROSE 2-3 GM-% IV SOLR
2.0000 g | INTRAVENOUS | Status: AC
  Administered 2012-12-11: 2 g via INTRAVENOUS
  Filled 2012-12-10: qty 50

## 2012-12-11 ENCOUNTER — Inpatient Hospital Stay (HOSPITAL_COMMUNITY): Payer: Medicare Other | Admitting: Certified Registered"

## 2012-12-11 ENCOUNTER — Encounter (HOSPITAL_COMMUNITY): Payer: Self-pay | Admitting: Surgery

## 2012-12-11 ENCOUNTER — Encounter (HOSPITAL_COMMUNITY): Payer: Self-pay | Admitting: Certified Registered"

## 2012-12-11 ENCOUNTER — Inpatient Hospital Stay (HOSPITAL_COMMUNITY)
Admission: RE | Admit: 2012-12-11 | Discharge: 2012-12-14 | DRG: 496 | Disposition: A | Discharge status: Home / Self Care | Payer: Medicare Other | Source: Ambulatory Visit | Attending: Orthopedic Surgery | Admitting: Orthopedic Surgery

## 2012-12-11 ENCOUNTER — Encounter (HOSPITAL_COMMUNITY)
Admission: RE | Disposition: A | Discharge status: Home / Self Care | Payer: Self-pay | Source: Ambulatory Visit | Attending: Orthopedic Surgery

## 2012-12-11 DIAGNOSIS — I96 Gangrene, not elsewhere classified: Secondary | ICD-10-CM | POA: Diagnosis present

## 2012-12-11 DIAGNOSIS — I2510 Atherosclerotic heart disease of native coronary artery without angina pectoris: Secondary | ICD-10-CM | POA: Diagnosis present

## 2012-12-11 DIAGNOSIS — T847XXA Infection and inflammatory reaction due to other internal orthopedic prosthetic devices, implants and grafts, initial encounter: Principal | ICD-10-CM | POA: Diagnosis present

## 2012-12-11 DIAGNOSIS — H543 Unqualified visual loss, both eyes: Secondary | ICD-10-CM | POA: Diagnosis present

## 2012-12-11 DIAGNOSIS — G4730 Sleep apnea, unspecified: Secondary | ICD-10-CM | POA: Diagnosis present

## 2012-12-11 DIAGNOSIS — M159 Polyosteoarthritis, unspecified: Secondary | ICD-10-CM | POA: Diagnosis present

## 2012-12-11 DIAGNOSIS — Y831 Surgical operation with implant of artificial internal device as the cause of abnormal reaction of the patient, or of later complication, without mention of misadventure at the time of the procedure: Secondary | ICD-10-CM | POA: Diagnosis present

## 2012-12-11 DIAGNOSIS — Z951 Presence of aortocoronary bypass graft: Secondary | ICD-10-CM

## 2012-12-11 DIAGNOSIS — Z96649 Presence of unspecified artificial hip joint: Secondary | ICD-10-CM

## 2012-12-11 DIAGNOSIS — L97409 Non-pressure chronic ulcer of unspecified heel and midfoot with unspecified severity: Secondary | ICD-10-CM

## 2012-12-11 DIAGNOSIS — Z7982 Long term (current) use of aspirin: Secondary | ICD-10-CM

## 2012-12-11 DIAGNOSIS — E669 Obesity, unspecified: Secondary | ICD-10-CM | POA: Diagnosis present

## 2012-12-11 DIAGNOSIS — N400 Enlarged prostate without lower urinary tract symptoms: Secondary | ICD-10-CM | POA: Diagnosis present

## 2012-12-11 DIAGNOSIS — D696 Thrombocytopenia, unspecified: Secondary | ICD-10-CM | POA: Diagnosis present

## 2012-12-11 DIAGNOSIS — K219 Gastro-esophageal reflux disease without esophagitis: Secondary | ICD-10-CM | POA: Diagnosis present

## 2012-12-11 DIAGNOSIS — Z79899 Other long term (current) drug therapy: Secondary | ICD-10-CM

## 2012-12-11 DIAGNOSIS — F411 Generalized anxiety disorder: Secondary | ICD-10-CM | POA: Diagnosis present

## 2012-12-11 DIAGNOSIS — I10 Essential (primary) hypertension: Secondary | ICD-10-CM | POA: Diagnosis present

## 2012-12-11 DIAGNOSIS — L97309 Non-pressure chronic ulcer of unspecified ankle with unspecified severity: Secondary | ICD-10-CM | POA: Diagnosis present

## 2012-12-11 DIAGNOSIS — Z85820 Personal history of malignant melanoma of skin: Secondary | ICD-10-CM

## 2012-12-11 DIAGNOSIS — E7800 Pure hypercholesterolemia, unspecified: Secondary | ICD-10-CM | POA: Diagnosis present

## 2012-12-11 HISTORY — PX: I & D EXTREMITY: SHX5045

## 2012-12-11 LAB — CBC
MCH: 32.4 pg (ref 26.0–34.0)
MCV: 95.9 fL (ref 78.0–100.0)
Platelets: 138 10*3/uL — ABNORMAL LOW (ref 150–400)
RBC: 4.85 MIL/uL (ref 4.22–5.81)
RDW: 13.2 % (ref 11.5–15.5)
WBC: 10.5 10*3/uL (ref 4.0–10.5)

## 2012-12-11 LAB — SURGICAL PCR SCREEN
MRSA, PCR: NEGATIVE
Staphylococcus aureus: NEGATIVE

## 2012-12-11 LAB — COMPREHENSIVE METABOLIC PANEL
ALT: 15 U/L (ref 0–53)
AST: 19 U/L (ref 0–37)
Albumin: 3.8 g/dL (ref 3.5–5.2)
CO2: 27 mEq/L (ref 19–32)
Calcium: 9.6 mg/dL (ref 8.4–10.5)
Creatinine, Ser: 0.93 mg/dL (ref 0.50–1.35)
Sodium: 140 mEq/L (ref 135–145)
Total Protein: 7.2 g/dL (ref 6.0–8.3)

## 2012-12-11 LAB — PROTIME-INR: INR: 0.97 (ref 0.00–1.49)

## 2012-12-11 SURGERY — IRRIGATION AND DEBRIDEMENT EXTREMITY
Anesthesia: General | Site: Ankle | Laterality: Right | Wound class: Dirty or Infected

## 2012-12-11 MED ORDER — FAMOTIDINE 10 MG PO TABS
10.0000 mg | ORAL_TABLET | Freq: Every day | ORAL | Status: DC
  Administered 2012-12-12 – 2012-12-14 (×3): 10 mg via ORAL
  Filled 2012-12-11 (×3): qty 1

## 2012-12-11 MED ORDER — HYDROCODONE-ACETAMINOPHEN 5-325 MG PO TABS
1.0000 | ORAL_TABLET | ORAL | Status: DC | PRN
  Administered 2012-12-11: 2 via ORAL
  Filled 2012-12-11: qty 2

## 2012-12-11 MED ORDER — TRIAZOLAM 0.25 MG PO TABS
0.2500 mg | ORAL_TABLET | Freq: Every evening | ORAL | Status: DC | PRN
  Administered 2012-12-11 – 2012-12-13 (×3): 0.25 mg via ORAL
  Filled 2012-12-11 (×4): qty 1

## 2012-12-11 MED ORDER — FENTANYL CITRATE 0.05 MG/ML IJ SOLN: 50.0000 ug | Freq: Once | INTRAMUSCULAR | Status: DC

## 2012-12-11 MED ORDER — HYDROMORPHONE HCL PF 1 MG/ML IJ SOLN
0.5000 mg | INTRAMUSCULAR | Status: DC | PRN
  Administered 2012-12-11: 1 mg via INTRAVENOUS
  Filled 2012-12-11: qty 1

## 2012-12-11 MED ORDER — DOXAZOSIN MESYLATE 4 MG PO TABS
4.0000 mg | ORAL_TABLET | Freq: Every day | ORAL | Status: DC
  Administered 2012-12-11 – 2012-12-13 (×3): 4 mg via ORAL
  Filled 2012-12-11 (×5): qty 1

## 2012-12-11 MED ORDER — TAMSULOSIN HCL 0.4 MG PO CAPS
0.4000 mg | ORAL_CAPSULE | Freq: Every day | ORAL | Status: DC
  Administered 2012-12-11 – 2012-12-13 (×3): 0.4 mg via ORAL
  Filled 2012-12-11 (×4): qty 1

## 2012-12-11 MED ORDER — GLYCOPYRROLATE 0.2 MG/ML IJ SOLN
INTRAMUSCULAR | Status: DC | PRN
  Administered 2012-12-11: .8 mg via INTRAVENOUS

## 2012-12-11 MED ORDER — PROPOFOL 10 MG/ML IV BOLUS
INTRAVENOUS | Status: DC | PRN
  Administered 2012-12-11: 140 mg via INTRAVENOUS

## 2012-12-11 MED ORDER — MUPIROCIN 2 % EX OINT
TOPICAL_OINTMENT | Freq: Two times a day (BID) | CUTANEOUS | Status: DC
  Administered 2012-12-11: 1 via NASAL
  Filled 2012-12-11: qty 22

## 2012-12-11 MED ORDER — FENTANYL CITRATE 0.05 MG/ML IJ SOLN
INTRAMUSCULAR | Status: DC | PRN
  Administered 2012-12-11: 50 ug via INTRAVENOUS

## 2012-12-11 MED ORDER — ONDANSETRON HCL 4 MG/2ML IJ SOLN
INTRAMUSCULAR | Status: DC | PRN
  Administered 2012-12-11: 4 mg via INTRAVENOUS

## 2012-12-11 MED ORDER — TRIAMTERENE-HCTZ 75-50 MG PO TABS
1.0000 | ORAL_TABLET | Freq: Every day | ORAL | Status: DC
  Administered 2012-12-12 – 2012-12-14 (×3): 1 via ORAL
  Filled 2012-12-11 (×4): qty 1

## 2012-12-11 MED ORDER — ONDANSETRON HCL 4 MG/2ML IJ SOLN
4.0000 mg | Freq: Four times a day (QID) | INTRAMUSCULAR | Status: DC | PRN
  Administered 2012-12-11 – 2012-12-12 (×3): 4 mg via INTRAVENOUS
  Filled 2012-12-11 (×3): qty 2

## 2012-12-11 MED ORDER — LIDOCAINE HCL (CARDIAC) 20 MG/ML IV SOLN
INTRAVENOUS | Status: DC | PRN
  Administered 2012-12-11: 100 mg via INTRAVENOUS

## 2012-12-11 MED ORDER — VANCOMYCIN HCL 1000 MG IV SOLR
INTRAVENOUS | Status: DC | PRN
  Administered 2012-12-11: 1000 mg

## 2012-12-11 MED ORDER — NEOSTIGMINE METHYLSULFATE 1 MG/ML IJ SOLN
INTRAMUSCULAR | Status: DC | PRN
  Administered 2012-12-11: 5 mg via INTRAVENOUS

## 2012-12-11 MED ORDER — HYDROMORPHONE HCL PF 1 MG/ML IJ SOLN
INTRAMUSCULAR | Status: AC
  Filled 2012-12-11: qty 1

## 2012-12-11 MED ORDER — OXYCODONE HCL 5 MG PO TABS: 5.0000 mg | ORAL_TABLET | Freq: Once | ORAL | Status: DC | PRN

## 2012-12-11 MED ORDER — PROMETHAZINE HCL 25 MG/ML IJ SOLN: 6.2500 mg | INTRAMUSCULAR | Status: DC | PRN

## 2012-12-11 MED ORDER — GENTAMICIN SULFATE 40 MG/ML IJ SOLN
INTRAMUSCULAR | Status: AC
  Filled 2012-12-11: qty 4

## 2012-12-11 MED ORDER — SODIUM CHLORIDE 0.9 % IV SOLN
INTRAVENOUS | Status: DC
  Administered 2012-12-11: 10 mL via INTRAVENOUS

## 2012-12-11 MED ORDER — ASPIRIN 81 MG PO CHEW
81.0000 mg | CHEWABLE_TABLET | Freq: Every day | ORAL | Status: DC
  Administered 2012-12-11 – 2012-12-13 (×3): 81 mg via ORAL
  Filled 2012-12-11: qty 13
  Filled 2012-12-11 (×3): qty 1

## 2012-12-11 MED ORDER — SIMVASTATIN 5 MG PO TABS
5.0000 mg | ORAL_TABLET | Freq: Every day | ORAL | Status: DC
  Administered 2012-12-11 – 2012-12-13 (×3): 5 mg via ORAL
  Filled 2012-12-11 (×4): qty 1

## 2012-12-11 MED ORDER — GENTAMICIN SULFATE 40 MG/ML IJ SOLN
INTRAMUSCULAR | Status: DC | PRN
  Administered 2012-12-11: 160 mg

## 2012-12-11 MED ORDER — MIDAZOLAM HCL 2 MG/2ML IJ SOLN: 1.0000 mg | INTRAMUSCULAR | Status: DC | PRN

## 2012-12-11 MED ORDER — HYDROMORPHONE HCL PF 1 MG/ML IJ SOLN
0.2500 mg | INTRAMUSCULAR | Status: DC | PRN
  Administered 2012-12-11: 0.5 mg via INTRAVENOUS

## 2012-12-11 MED ORDER — VANCOMYCIN HCL 1000 MG IV SOLR
INTRAVENOUS | Status: AC
  Filled 2012-12-11: qty 1000

## 2012-12-11 MED ORDER — LACTATED RINGERS IV SOLN
INTRAVENOUS | Status: DC | PRN
  Administered 2012-12-11: 12:00:00 via INTRAVENOUS

## 2012-12-11 MED ORDER — CEFAZOLIN SODIUM 1-5 GM-% IV SOLN
1.0000 g | Freq: Four times a day (QID) | INTRAVENOUS | Status: AC
  Administered 2012-12-11 – 2012-12-12 (×3): 1 g via INTRAVENOUS
  Filled 2012-12-11 (×3): qty 50

## 2012-12-11 MED ORDER — ONDANSETRON HCL 4 MG PO TABS
4.0000 mg | ORAL_TABLET | Freq: Four times a day (QID) | ORAL | Status: DC | PRN
  Administered 2012-12-12: 4 mg via ORAL
  Filled 2012-12-11: qty 1

## 2012-12-11 MED ORDER — FINASTERIDE 5 MG PO TABS
5.0000 mg | ORAL_TABLET | Freq: Every day | ORAL | Status: DC
  Administered 2012-12-12 – 2012-12-14 (×3): 5 mg via ORAL
  Filled 2012-12-11 (×4): qty 1

## 2012-12-11 MED ORDER — ROCURONIUM BROMIDE 100 MG/10ML IV SOLN
INTRAVENOUS | Status: DC | PRN
  Administered 2012-12-11: 40 mg via INTRAVENOUS

## 2012-12-11 MED ORDER — OXYCODONE HCL 5 MG/5ML PO SOLN: 5.0000 mg | Freq: Once | ORAL | Status: DC | PRN

## 2012-12-11 MED ORDER — MUPIROCIN 2 % EX OINT
TOPICAL_OINTMENT | CUTANEOUS | Status: AC
  Filled 2012-12-11: qty 22

## 2012-12-11 MED ORDER — METOCLOPRAMIDE HCL 10 MG PO TABS: 5.0000 mg | ORAL_TABLET | Freq: Three times a day (TID) | ORAL | Status: DC | PRN

## 2012-12-11 MED ORDER — METOCLOPRAMIDE HCL 5 MG/ML IJ SOLN
5.0000 mg | Freq: Three times a day (TID) | INTRAMUSCULAR | Status: DC | PRN
  Administered 2012-12-12: 10 mg via INTRAVENOUS
  Filled 2012-12-11: qty 2

## 2012-12-11 MED ORDER — LACTATED RINGERS IV SOLN
INTRAVENOUS | Status: DC
  Administered 2012-12-11: 11:00:00 via INTRAVENOUS

## 2012-12-11 MED ORDER — OXYCODONE-ACETAMINOPHEN 5-325 MG PO TABS
1.0000 | ORAL_TABLET | ORAL | Status: DC | PRN
  Administered 2012-12-12: 1 via ORAL
  Filled 2012-12-11: qty 1

## 2012-12-11 MED ORDER — METOPROLOL TARTRATE 25 MG PO TABS
25.0000 mg | ORAL_TABLET | Freq: Two times a day (BID) | ORAL | Status: DC
  Administered 2012-12-11 – 2012-12-14 (×6): 25 mg via ORAL
  Filled 2012-12-11 (×8): qty 1

## 2012-12-11 MED ORDER — SODIUM CHLORIDE 0.9 % IR SOLN
Status: DC | PRN
  Administered 2012-12-11: 1000 mL

## 2012-12-11 SURGICAL SUPPLY — 46 items
BLADE SURG 10 STRL SS (BLADE) ×2 IMPLANT
BNDG COHESIVE 4X5 TAN STRL (GAUZE/BANDAGES/DRESSINGS) ×2 IMPLANT
BNDG COHESIVE 6X5 TAN STRL LF (GAUZE/BANDAGES/DRESSINGS) ×4 IMPLANT
BNDG GAUZE STRTCH 6 (GAUZE/BANDAGES/DRESSINGS) ×6 IMPLANT
CLOTH BEACON ORANGE TIMEOUT ST (SAFETY) ×2 IMPLANT
COTTON STERILE ROLL (GAUZE/BANDAGES/DRESSINGS) ×2 IMPLANT
COVER SURGICAL LIGHT HANDLE (MISCELLANEOUS) ×2 IMPLANT
CUFF TOURNIQUET SINGLE 18IN (TOURNIQUET CUFF) ×2 IMPLANT
CUFF TOURNIQUET SINGLE 24IN (TOURNIQUET CUFF) IMPLANT
CUFF TOURNIQUET SINGLE 34IN LL (TOURNIQUET CUFF) IMPLANT
CUFF TOURNIQUET SINGLE 44IN (TOURNIQUET CUFF) IMPLANT
DRAPE U-SHAPE 47X51 STRL (DRAPES) ×2 IMPLANT
DRSG ADAPTIC 3X8 NADH LF (GAUZE/BANDAGES/DRESSINGS) ×2 IMPLANT
DRSG VAC ATS SM SENSATRAC (GAUZE/BANDAGES/DRESSINGS) ×2 IMPLANT
DURAPREP 26ML APPLICATOR (WOUND CARE) ×2 IMPLANT
ELECT CAUTERY BLADE 6.4 (BLADE) IMPLANT
ELECT REM PT RETURN 9FT ADLT (ELECTROSURGICAL)
ELECTRODE REM PT RTRN 9FT ADLT (ELECTROSURGICAL) IMPLANT
GLOVE BIOGEL PI IND STRL 9 (GLOVE) ×1 IMPLANT
GLOVE BIOGEL PI INDICATOR 9 (GLOVE) ×1
GLOVE SURG ORTHO 9.0 STRL STRW (GLOVE) ×2 IMPLANT
GOWN PREVENTION PLUS XLARGE (GOWN DISPOSABLE) ×2 IMPLANT
GOWN SRG XL XLNG 56XLVL 4 (GOWN DISPOSABLE) ×1 IMPLANT
GOWN STRL NON-REIN XL XLG LVL4 (GOWN DISPOSABLE) ×1
HANDPIECE INTERPULSE COAX TIP (DISPOSABLE)
KIT BASIN OR (CUSTOM PROCEDURE TRAY) ×2 IMPLANT
KIT ROOM TURNOVER OR (KITS) ×2 IMPLANT
KIT STIMULAN RAPID CURE 5CC (Orthopedic Implant) ×2 IMPLANT
MANIFOLD NEPTUNE II (INSTRUMENTS) ×2 IMPLANT
MICROMATRIX 500MG (Tissue) ×2 IMPLANT
NS IRRIG 1000ML POUR BTL (IV SOLUTION) ×2 IMPLANT
PACK ORTHO EXTREMITY (CUSTOM PROCEDURE TRAY) ×2 IMPLANT
PAD ARMBOARD 7.5X6 YLW CONV (MISCELLANEOUS) ×4 IMPLANT
PADDING CAST COTTON 6X4 STRL (CAST SUPPLIES) ×2 IMPLANT
SET HNDPC FAN SPRY TIP SCT (DISPOSABLE) IMPLANT
SOLUTION PARTIC MCRMTRX 500MG (Tissue) ×1 IMPLANT
SPONGE GAUZE 4X4 12PLY (GAUZE/BANDAGES/DRESSINGS) ×2 IMPLANT
SPONGE LAP 18X18 X RAY DECT (DISPOSABLE) ×2 IMPLANT
STOCKINETTE IMPERVIOUS 9X36 MD (GAUZE/BANDAGES/DRESSINGS) ×2 IMPLANT
TOWEL OR 17X24 6PK STRL BLUE (TOWEL DISPOSABLE) ×2 IMPLANT
TOWEL OR 17X26 10 PK STRL BLUE (TOWEL DISPOSABLE) ×2 IMPLANT
TUBE ANAEROBIC SPECIMEN COL (MISCELLANEOUS) IMPLANT
TUBE CONNECTING 12X1/4 (SUCTIONS) ×2 IMPLANT
UNDERPAD 30X30 INCONTINENT (UNDERPADS AND DIAPERS) ×2 IMPLANT
WATER STERILE IRR 1000ML POUR (IV SOLUTION) ×2 IMPLANT
YANKAUER SUCT BULB TIP NO VENT (SUCTIONS) ×2 IMPLANT

## 2012-12-11 NOTE — Anesthesia Postprocedure Evaluation (Signed)
  Anesthesia Post-op Note  Patient: Scott [name]  Procedure(s) Performed: Procedure(s) (LRB) with comments: IRRIGATION AND DEBRIDEMENT EXTREMITY (Right) - Irrigation and Debridement achilles, wound closure, apply wound vac, place antibiotic beads  Patient Location: PACU  Anesthesia Type:General  Level of Consciousness: awake and alert   Airway and Oxygen Therapy: Patient Spontanous Breathing  Post-op Pain: mild  Post-op Assessment: Post-op Vital signs reviewed, Patient's Cardiovascular Status Stable, Respiratory Function Stable, Patent Airway, No signs of Nausea or vomiting and Pain level controlled  Post-op Vital Signs: stable  Complications: No apparent anesthesia complications

## 2012-12-11 NOTE — Transfer of Care (Signed)
Immediate Anesthesia Transfer of Care Note  Patient: Scott [name]  Procedure(s) Performed: Procedure(s) (LRB) with comments: IRRIGATION AND DEBRIDEMENT EXTREMITY (Right) - Irrigation and Debridement achilles, wound closure, apply wound vac, place antibiotic beads  Patient Location: PACU  Anesthesia Type:General  Level of Consciousness: awake, alert , oriented and patient cooperative  Airway & Oxygen Therapy: Patient Spontanous Breathing and Patient connected to nasal cannula oxygen  Post-op Assessment: Report given to PACU RN, Post -op Vital signs reviewed and stable and Patient moving all extremities  Post vital signs: Reviewed and stable  Complications: No apparent anesthesia complications

## 2012-12-11 NOTE — Progress Notes (Signed)
Orthopedic Tech Progress Note Patient Details:  Scott [name] Jul 23, 1928 [id number]  Ortho Devices Type of Ortho Device: Other (comment) (prafo boot) Ortho Device/Splint Location: (R) LE Ortho Device/Splint Interventions: Application   Jennye Moccasin 12/11/2012, 4:50 PM

## 2012-12-11 NOTE — Preoperative (Signed)
Beta Blockers   Reason not to administer Beta Blockers:Not Applicable 

## 2012-12-11 NOTE — Anesthesia Preprocedure Evaluation (Signed)
Anesthesia Evaluation  Patient identified by MRN, date of birth, ID band Patient awake    Reviewed: Allergy & Precautions, H&P , NPO status , Patient's Chart, lab work & pertinent test results  Airway Mallampati: I TM Distance: >3 FB Neck ROM: full    Dental  (+) Teeth Intact and Dental Advidsory Given   Pulmonary shortness of breath and with exertion, sleep apnea ,  breath sounds clear to auscultation        Cardiovascular hypertension, + CAD, + Past MI, + CABG and + DOE Rhythm:regular Rate:Normal     Neuro/Psych Anxiety    GI/Hepatic GERD-  ,  Endo/Other    Renal/GU      Musculoskeletal   Abdominal (+) + obese,   Peds  Hematology   Anesthesia Other Findings   Reproductive/Obstetrics                           Anesthesia Physical Anesthesia Plan  ASA: III  Anesthesia Plan: General   Post-op Pain Management:    Induction: Intravenous  Airway Management Planned: LMA  Additional Equipment:   Intra-op Plan:   Post-operative Plan: Extubation in OR  Informed Consent: I have reviewed the patients History and Physical, chart, labs and discussed the procedure including the risks, benefits and alternatives for the proposed anesthesia with the patient or authorized representative who has indicated his/her understanding and acceptance.     Plan Discussed with: CRNA and Surgeon  Anesthesia Plan Comments: (Will need OET if prone)        Anesthesia Quick Evaluation

## 2012-12-11 NOTE — H&P (Signed)
Scott [name] is an 79 y.o. male.   Chief Complaint: Chronic ulcer right posterior Achilles. HPI: Patient is a 79 year old gentleman who is status post Achilles tendon reconstruction with Dr. Madelon Lips. Patient has had progressive breakdown of the wound he is undergone serial debridements in the office and despite aggressive conservative therapy for wound debridement he has had progressive necrotic changes and presents at this time for surgical intervention.  Past Medical History  Diagnosis Date  . SOB (shortness of breath)   . Hypercholesterolemia   . GERD (gastroesophageal reflux disease)   . Sleep apnea     with use of CPAP  . Cranial neuropathy     resolved   . Hypertension   . CAD (coronary artery disease)     s/p CABG in 2007  . Bronchitis   . BPH (benign prostatic hyperplasia)   . Blind   . Thrombocytopenia     possibly due to Lovenox  . Obesity   . Anxiety     pt. reports that he has occas. nervous episodes   . H/O echocardiogram     last echo, stress test 05/2012- pt. followed by [place]   . Cancer     melanoma - removed L ear - 2010  . Generalized OA   . Osteoarthritis     End-stage osteoarthritis, right hip  . Anemia     Acute blood loss anemia secondary to surgery, followed fr/ Hip replacement     Past Surgical History  Procedure Date  . Coronary artery bypass graft 2007    LIMA GRAFT TO THE LAD, SAPHENOUS VEIN GRAFT TO THE FIRST OBTUSE MARIGNAL, SAPHENOUS VEIN GRAFT TO THE SECOND OBTUSE MARGINAL AND A SAPHENOUS VEIN GRAFT TO THE ACUTE MARGINAL AND POSTERIOR LATERAL BRANCH  . Total hip arthroplasty     right  . Appendectomy   . Carpel tunnel surgery-bilateral     both hands   . Knee 11/12    left arthroscopic  . Cardiac catheterization 12/15/2005    NORMAL. EF 60%, angioplasty 1995  . Artery biopsy     temporal - L side- wnl, double vision treated /w prednisone - 1991  . Achilles tendon surgery 10/06/2012    Procedure: ACHILLES TENDON REPAIR;  Surgeon: Thera Flake., MD;  Location: [facility] OR;  Service: Orthopedics;  Laterality: Right;  REPAIR RIGHT RUPTURE ACHILLES TENDON PRIMARY OPEN/PERCUTANEOUS, EXCISION PARTIAL BONE TALUS/CANCANEUS, REPAIR PARTIAL EXCISION CALCANEUS  . Calcaneal osteotomy 10/06/2012    Procedure: CALCANEAL OSTEOTOMY;  Surgeon: Thera Flake., MD;  Location: [facility] OR;  Service: Orthopedics;  Laterality: Right;    Family History  Problem Relation Age of Onset  . Prostate cancer Brother   . Heart disease Brother   . Heart disease Brother    Social History:  reports that he has never smoked. He does not have any smokeless tobacco history on file. He reports that he does not drink alcohol or use illicit drugs.  Allergies:  Allergies  Allergen Reactions  . Amlodipine     Swelling of feet and legs  . Bactrim (Sulfamethoxazole W-Trimethoprim)     Constipation   . Codeine Nausea Only  . Prednisone     Patient has tolerated doses for colds. Thinks it caused swelling in the past  . Shellfish Allergy Other (See Comments)    N&V, shaking  . Sonata ([place])     Hyper feelings    Medications Prior to Admission  Medication Sig Dispense Refill  . aspirin 81 MG  tablet Take 81 mg by mouth daily.        Marland Kitchen doxazosin (CARDURA) 4 MG tablet Take 4 mg by mouth at bedtime.       Marland Kitchen doxycycline (VIBRAMYCIN) 100 MG capsule Take 1 tablet by mouth 2 (two) times daily.       . finasteride (PROSCAR) 5 MG tablet Take 5 mg by mouth daily before breakfast.       . fish oil-omega-3 fatty acids 1000 MG capsule Take 1 g by mouth every other day.       . lovastatin (MEVACOR) 20 MG tablet Take 20 mg by mouth at bedtime.        . metoprolol (LOPRESSOR) 50 MG tablet Take 25 mg by mouth 2 (two) times daily.      . ranitidine (ZANTAC) 75 MG tablet Take 75 mg by mouth daily before breakfast.      . silver sulfADIAZINE (SILVADENE) 1 % cream Apply 1 application topically daily.       . Tamsulosin HCl (FLOMAX) 0.4 MG CAPS Take 0.4 mg by mouth daily after  supper.      . triamterene-hydrochlorothiazide (MAXZIDE) 75-50 MG per tablet Take 1 tablet by mouth daily before breakfast.       . triazolam (HALCION) 0.25 MG tablet Take 1 tablet (0.25 mg total) by mouth at bedtime as needed (If needed for sleep).  30 tablet  5    Results for orders placed during the hospital encounter of 12/11/12 (from the past 48 hour(s))  APTT     Status: Normal   Collection Time   12/11/12 10:02 AM      Component Value Range Comment   aPTT 32  24 - 37 seconds   CBC     Status: Abnormal   Collection Time   12/11/12 10:02 AM      Component Value Range Comment   WBC 10.5  4.0 - 10.5 K/uL    RBC 4.85  4.22 - 5.81 MIL/uL    Hemoglobin 15.7  13.0 - 17.0 g/dL    HCT 40.9  81.1 - 91.4 %    MCV 95.9  78.0 - 100.0 fL    MCH 32.4  26.0 - 34.0 pg    MCHC 33.8  30.0 - 36.0 g/dL    RDW 78.2  95.6 - 21.3 %    Platelets 138 (*) 150 - 400 K/uL   COMPREHENSIVE METABOLIC PANEL     Status: Abnormal   Collection Time   12/11/12 10:02 AM      Component Value Range Comment   Sodium 140  135 - 145 mEq/L    Potassium 3.3 (*) 3.5 - 5.1 mEq/L    Chloride 100  96 - 112 mEq/L    CO2 27  19 - 32 mEq/L    Glucose, Bld 99  70 - 99 mg/dL    BUN 17  6 - 23 mg/dL    Creatinine, Ser 0.86  0.50 - 1.35 mg/dL    Calcium 9.6  8.4 - 57.8 mg/dL    Total Protein 7.2  6.0 - 8.3 g/dL    Albumin 3.8  3.5 - 5.2 g/dL    AST 19  0 - 37 U/L    ALT 15  0 - 53 U/L    Alkaline Phosphatase 77  39 - 117 U/L    Total Bilirubin 1.3 (*) 0.3 - 1.2 mg/dL    GFR calc non Af Amer 76 (*) >90 mL/min    GFR  calc Af Amer 88 (*) >90 mL/min   PROTIME-INR     Status: Normal   Collection Time   12/11/12 10:02 AM      Component Value Range Comment   Prothrombin Time 12.8  11.6 - 15.2 seconds    INR 0.97  0.00 - 1.49    No results found.  Review of Systems  All other systems reviewed and are negative.    Blood pressure 137/80, pulse 64, temperature 97.8 F (36.6 C), temperature source Oral, resp. rate 18, height  5\' 5"  (1.651 m), weight 84.1 kg (185 lb 6.5 oz), SpO2 95.00%. Physical Exam  On examination patient has necrotic Achilles necrotic skin over the Achilles. There is no cellulitis no abscess. Assessment/Plan Assessment necrotic wound and necrotic Achilles status post Achilles tendon reconstruction.  Plan: We'll plan for formal surgical debridement in the operating room with debridement of the Achilles tendon debridement of the wound wound closure with placement of antibiotic beads and placement of a wound VAC. Risks and benefits were discussed including infection neurovascular injury nonhealing of the wound need for additional surgery. Patient states he understands and wished to proceed at this time.  DUDA,MARCUS V 12/11/2012, 11:36 AM

## 2012-12-11 NOTE — Anesthesia Procedure Notes (Signed)
Procedure Name: Intubation Date/Time: 12/11/2012 12:03 PM Performed by: Jerilee Hoh Pre-anesthesia Checklist: Patient identified, Emergency Drugs available, Suction available and Patient being monitored Patient Re-evaluated:Patient Re-evaluated prior to inductionOxygen Delivery Method: Circle system utilized Preoxygenation: Pre-oxygenation with 100% oxygen Intubation Type: IV induction Ventilation: Mask ventilation without difficulty and Oral airway inserted - appropriate to patient size Laryngoscope Size: Mac and 4 Grade View: Grade II Tube type: Oral Tube size: 7.5 mm Number of attempts: 1 Airway Equipment and Method: Stylet Placement Confirmation: ETT inserted through vocal cords under direct vision,  positive ETCO2 and breath sounds checked- equal and bilateral Secured at: 22 cm Tube secured with: Tape Dental Injury: Teeth and Oropharynx as per pre-operative assessment

## 2012-12-11 NOTE — Op Note (Signed)
OPERATIVE REPORT  DATE OF SURGERY: 12/11/2012  PATIENT:  [name],  79 y.o. male  PRE-OPERATIVE DIAGNOSIS:  Gangrenous achilles wound/ tendon right ankle  POST-OPERATIVE DIAGNOSIS:  Gangrenous achilles wound/ tendon Infected deep retained hardware right foot and ankle  PROCEDURE:  Procedure(s): IRRIGATION AND DEBRIDEMENT EXTREMITY Partial calcaneal excision. Right foot Removal of deep retained anchors x3. Debridement of necrotic Achilles tendon. Placement of antibiotic beads. Placement of a cell powder xenograft. Local tissue rearrangement for wound closure. Placement of wound VAC.  SURGEON:  Surgeon(s): Nadara Mustard, MD  ANESTHESIA:   general  EBL:  min ML  SPECIMEN:  No Specimen  TOURNIQUET:  * No tourniquets in log *  PROCEDURE DETAILS: Patient is an 79 year old gentleman who is status post open reduction internal fixation of the right Achilles with Dr. Madelon Lips. Patient had wound breakdown and was treated with conservative treatment and the office 2 provide wound closure with serial debridements in the office. Do to progressive necrotic changes patient presents at this time for surgical intervention. Risks and benefits were discussed including infection neurovascular injury nonhealing of the wound nonhealing of the Achilles need for additional surgery. Patient states he understands was to proceed at this time. Description of procedure patient was brought to the operating room and underwent a general anesthetic. After adequate levels of anesthesia were obtained patient was placed in the floppy lateral position and the right lower extremity was prepped using DuraPrep and draped into a sterile field. The necrotic wound was ellipsed out in this left a wound which is approximately 10 cm in length and 2 cm in width. All necrotic Achilles and soft tissue was excised. There did appear to be a deep abscess which communicated with the deep retained interlocking screws. The partial  aspect the Achilles was resected and the deep retained hardware x3 were removed all associated suture was removed as well. The wound was irrigated with normal saline there was good bleeding granulation tissue. Antibiotic beads were mixed on the back table with stimulant 5 cc powder 160 mg gentamicin 500 mg of vancomycin antibiotic beads were placed adjacent and deep within the bone. These stimulant xenograft powder was then applied over the Achilles. Local tissue rearrangement was then performed to close the 10 x 2 cm wound with 2-0 nylon. The skin closed well without tension the skin. A wound VAC was applied set to -75 mm of suction. This had a good suction fit. Patient was extubated taken to the PACU in stable condition plan for admission plan for 3 days of a wound VAC therapy and discharge at that time.  PLAN OF CARE: Admit to inpatient   PATIENT DISPOSITION:  PACU - hemodynamically stable.   Nadara Mustard, MD 12/11/2012 12:36 PM

## 2012-12-12 ENCOUNTER — Encounter (HOSPITAL_COMMUNITY): Payer: Self-pay | Admitting: Orthopedic Surgery

## 2012-12-12 MED ORDER — BISACODYL 5 MG PO TBEC
10.0000 mg | DELAYED_RELEASE_TABLET | Freq: Every evening | ORAL | Status: DC | PRN
  Administered 2012-12-12: 10 mg via ORAL
  Filled 2012-12-12: qty 2

## 2012-12-12 NOTE — Progress Notes (Signed)
Utilization review completed. Makalah Asberry, RN, BSN. 

## 2012-12-12 NOTE — Progress Notes (Signed)
Patient ID: Scott [name], male   DOB: 01/30/1928, 79 y.o.   MRN: [id number] Postoperative day 1 debridement infected right Achilles tendon and wound with infected deep retained hardware local tissue rearrangement placement of antibiotic beads and placement of a wound VAC. Examination the wound VAC has good drainage the PRAFO is. unloading the wound plan for discharge Wednesday or Thursday.

## 2012-12-12 NOTE — Clinical Documentation Improvement (Signed)
EXCISIONAL DEBRIDEMENT DOCUMENTATION CLARIFICATION  THIS DOCUMENT IS NOT A PERMANENT PART OF THE MEDICAL RECORD  TO RESPOND TO THE THIS QUERY, FOLLOW THE INSTRUCTIONS BELOW:  1. If needed, update documentation for the patient's encounter via the notes activity.  2. Access this query again and click edit on the In [employer].  3. After updating, or not, click F2 to complete all highlighted (required) fields concerning your review. Select "additional documentation in the medical record" OR "no additional documentation provided".  4. Click Sign note button.  5. The deficiency will fall out of your In Basket *Please let us know if you are not able to complete this workflow by phone or e-mail (listed below).  Please update your documentation within the medical record to reflect your response to this query.                                                                                        12/12/12   Dear Dr. Judie Petit. [facility],  In a better effort to capture your patient's severity of illness, reflect appropriate length of stay and utilization of resources, a review of the patient medical record has revealed the following indicators.    Based on your clinical judgment, please clarify and document in a progress note and/or discharge summary the clinical condition associated with the following supporting information:  In responding to this query please exercise your independent judgment.  The fact that a query is asked, does not imply that any particular answer is desired or expected. Because that is documentation in the medical record of "debridement" clarification is needed.  In reviewing the operative note the following pieces of data were not found, please provide the following information if possible ( whether this was excisional or non excisional, the instrumentation used, and the depth of the level of the debrided area) .  Thank you   Please document the below (3) key elements in a  progress note:  1.  Type of Debridement:          Excisional Debridement-  Cutting away necrotic, devitalized tissue or slough to  Level of viable tissue using a sharp instrument (example, scalpel, scissors, etc).           NON Excisional Debridement-  The removal of necrotic, devitalized tissue or slough by means of scraping, mechanical brushing, flushing, or washing. (example: irrigation, whirlpool);  Minor removal of loose fragments  2.  Please indicate instrument used:  Scissors, Scalpel, Curette, or other  3.  Please document depth of the debridement:             Partial Thickness  Full Thickness  Skin and Subcutaneous Tissue  Subcutaneous Tissue and Muscle  Subcutaneous Tissue, Muscle and Bone  Other    You may use possible, probable, or suspect with inpatient documentation. possible, probable, suspected diagnoses MUST be documented at the time of discharge  Reviewed:  no additional documentation provided Please read the operative note. The operative note discussed that the wound was excised the Achilles tendon was excised and partial calcaneus was excised.  Thank You,  Leonette Most Addison  Clinical Documentation Specialist, BSN: Pager  [phone]/ off [phone]  Health Information Management [place]

## 2012-12-12 NOTE — Evaluation (Signed)
Physical Therapy Evaluation Patient Details Name: Scott [name] MRN: [id number] DOB: 12-18-1927 Today's Date: 12/12/2012 Time: 1030-1106 PT Time Calculation (min): 36 min  PT Assessment / Plan / Recommendation Clinical Impression  Pt is an 79 y/o s/p surgical debridment of infecterd Achilles tendon graft.  Pt demonstrate modified Inedpendence with all mobility.  No further Acute PT inttervention required.       PT Assessment       Follow Up Recommendations  No PT follow up    Equipment Recommendations  None recommended by PT    Precautions / Restrictions Precautions Precautions: Other (comment) (Wound vac on R Foot.  ) Required Braces or Orthoses: Other Brace/Splint (PRAFO on R foot) Other Brace/Splint: PRAFO on R foot to be worn at all times Restrictions Weight Bearing Restrictions: Yes RLE Weight Bearing: Weight bearing as tolerated   Pertinent Vitals/Pain 2/10 pain in R foot. No intervention required per pt.        Mobility  Bed Mobility Bed Mobility: Supine to Sit Supine to Sit: 7: Independent Details for Bed Mobility Assistance: no assistance required other than managing leads from wound vac.   Transfers Transfers: Sit to Stand;Stand to Sit Sit to Stand: 7: Independent;From bed;With upper extremity assist Stand to Sit: 7: Independent;To chair/3-in-1;With upper extremity assist Ambulation/Gait Ambulation/Gait Assistance: 6: Modified independent (Device/Increase time) Ambulation Distance (Feet): 200 Feet Assistive device: Rolling walker Ambulation/Gait Assistance Details: Wound vac applied to pt's walker.   Gait Pattern: Within Functional Limits Stairs: Yes Stairs Assistance: 5: Supervision;6: Modified independent (Device/Increase time) Stairs Assistance Details (indicate cue type and reason): Initially pt attempted to step up two both steps then lift RW.  Demonstrated the danger of falling with that technique to the pt and had pt perform task with proper  technique of 1 step then advance walker before attempting second step. Pt vebalized understranding  and demonstrated proper techniuqe with no need of assistance.  Stair Management Technique: Backwards;With walker Number of Stairs: 2  (two trials/  ) Wheelchair Mobility Wheelchair Mobility: No     PT Diagnosis:    PT Problem List:   PT Treatment Interventions:     PT Goals Acute Rehab PT Goals PT Goal Formulation: With patient  Visit Information  Last PT Received On: 12/12/12 Assistance Needed: +1    Subjective Data  Subjective: Agree toPT EVal  Patient Stated Goal: Be able to get out and active again.     Prior Functioning  Home Living Lives With: Spouse Available Help at Discharge: Family Type of Home: House Home Access: Stairs to enter [employer] of Steps: 2 Home Layout: One level Bathroom Shower/Tub: Walk-in shower;Door Home Adaptive Equipment: Walker - Engineer, agricultural - four wheeled Prior Function Level of Independence: Independent with assistive device(s) Able to Take Stairs?: Yes Driving: No Vocation: Retired Musician: No difficulties    Cognition  Overall Cognitive Status: Appears within functional limits for tasks assessed/performed Arousal/Alertness: Awake/alert Orientation Level: Appears intact for tasks assessed Behavior During Session: [facility] for tasks performed    Extremity/Trunk Assessment Right Upper Extremity Assessment RUE ROM/Strength/Tone: Within functional levels Left Upper Extremity Assessment LUE ROM/Strength/Tone: Within functional levels Right Lower Extremity Assessment RLE ROM/Strength/Tone: [facility] for tasks assessed Left Lower Extremity Assessment LLE ROM/Strength/Tone: WFL for tasks assessed   Balance    End of Session PT - End of Session Equipment Utilized During Treatment: Gait belt;Right ankle foot orthosis Activity Tolerance: Patient tolerated treatment well Patient left: in chair;with call  bell/phone within reach Nurse Communication: Mobility  status  GP     Scott [name] 12/12/2012, 12:46 PM  Scott [name] L. Scott [name] DPT [phone]

## 2012-12-13 MED ORDER — ACETAMINOPHEN 325 MG PO TABS: 650.0000 mg | ORAL_TABLET | Freq: Four times a day (QID) | ORAL | Status: DC | PRN

## 2012-12-13 MED ORDER — DOXYCYCLINE HYCLATE 100 MG PO TABS
100.0000 mg | ORAL_TABLET | Freq: Two times a day (BID) | ORAL | Status: DC
  Administered 2012-12-13 – 2012-12-14 (×3): 100 mg via ORAL
  Filled 2012-12-13 (×4): qty 1

## 2012-12-13 NOTE — Progress Notes (Signed)
Patient ID: Scott [name], male   DOB: September 22, 1928, 79 y.o.   MRN: [id number] Postoperative day 2 status post excisional debridement for infection and abscess of the Achilles tendon wound. Patient is completed 24 hours of IV antibiotics and we'll have him resume his by mouth doxycycline. Plan for discharge tomorrow. Continue wound VAC today.

## 2012-12-14 NOTE — Progress Notes (Signed)
Pt discharged in stable condition home with his wife. He was provided discharge information and follow up instructions.

## 2012-12-14 NOTE — Progress Notes (Signed)
Patient ID: Scott [name], male   DOB: 06-21-1928, 79 y.o.   MRN: [id number] This is an addendum for the operative note.  Patient underwent excisional debridement of skin soft tissue Achilles tendon and bone of the calcaneus. The wound was approximately 4 x 10 cm and 1 cm deep. Knife curet Ronjair were used to remove the tendon and deep hardware and soft tissue.

## 2012-12-14 NOTE — Discharge Summary (Signed)
Physician Discharge Summary  Patient ID: Scott [name] MRN: [id number] DOB/AGE: Jul 09, 1928 79 y.o.  Admit date: 12/11/2012 Discharge date: 12/14/2012  Admission Diagnoses: Abscess ulceration necrotic wound with infected deep retained hardware right Achilles  Discharge Diagnoses: Same Active Problems:  * No active hospital problems. *    Discharged Condition: stable  Hospital Course: Patient's hospital course was essentially remarkable he underwent surgical debridement wound closure and placement of a wound VAC. Placed operatively patient progressed well the incision was healing nicely the wound VAC was removed and he was discharged to home in stable condition.  Consults: None  Significant Diagnostic Studies: labs: Routine labs  Treatments: surgery: See operative note  Discharge Exam: Blood pressure 113/64, pulse 75, temperature 98.3 F (36.8 C), temperature source Oral, resp. rate 16, height 5\' 5"  (1.651 m), weight 84.1 kg (185 lb 6.5 oz), SpO2 93.00%. Incision/Wound: incision clean dry and intact at time of discharge. Wound edges well approximated no ischemic changes.  Disposition: 01-Home or Self Care  Discharge Orders    Future Appointments: Provider: Department: Dept Phone: Center:   12/07/2013 9:00 AM Waymon Budge, MD [facility] [phone] None     Future Orders Please Complete By Expires   Diet - low sodium heart healthy      Call MD / Call 911      Comments:   If you experience chest pain or shortness of breath, CALL 911 and be transported to the hospital emergency room.  If you develope a fever above 101 F, pus (white drainage) or increased drainage or redness at the wound, or calf pain, call your surgeon's office.   Constipation Prevention      Comments:   Drink plenty of fluids.  Prune juice may be helpful.  You may use a stool softener, such as Colace (over the counter) 100 mg twice a day.  Use MiraLax (over the counter) for constipation as needed.   Increase activity slowly as tolerated          Medication List     As of 12/14/2012  7:09 AM    TAKE these medications         aspirin 81 MG tablet   Take 81 mg by mouth daily.      doxazosin 4 MG tablet   Commonly known as: CARDURA   Take 4 mg by mouth at bedtime.      doxycycline 100 MG capsule   Commonly known as: VIBRAMYCIN   Take 1 tablet by mouth 2 (two) times daily.      finasteride 5 MG tablet   Commonly known as: PROSCAR   Take 5 mg by mouth daily before breakfast.      fish oil-omega-3 fatty acids 1000 MG capsule   Take 1 g by mouth every other day.      lovastatin 20 MG tablet   Commonly known as: MEVACOR   Take 20 mg by mouth at bedtime.      metoprolol 50 MG tablet   Commonly known as: LOPRESSOR   Take 25 mg by mouth 2 (two) times daily.      ranitidine 75 MG tablet   Commonly known as: ZANTAC   Take 75 mg by mouth daily before breakfast.      silver sulfADIAZINE 1 % cream   Commonly known as: SILVADENE   Apply 1 application topically daily.      Tamsulosin HCl 0.4 MG Caps   Commonly known as: FLOMAX   Take 0.4 mg  by mouth daily after supper.      triamterene-hydrochlorothiazide 75-50 MG per tablet   Commonly known as: MAXZIDE   Take 1 tablet by mouth daily before breakfast.      triazolam 0.25 MG tablet   Commonly known as: HALCION   Take 1 tablet (0.25 mg total) by mouth at bedtime as needed (If needed for sleep).           Follow-up Information    Follow up with DUDA,MARCUS V, MD. In 1 week.   Contact information:   [address] [place] [address] [phone]          Signed: Nadara Mustard 12/14/2012, 7:09 AM

## 2012-12-17 NOTE — Assessment & Plan Note (Signed)
Sleep hygiene discussed. Appropriate use of sleep medications considered. He does best with a short-half-life med.

## 2012-12-17 NOTE — Assessment & Plan Note (Signed)
We can continue CPAP 10. Compliance and control are good Consider reassessing oxygen during sleep in the future

## 2012-12-18 ENCOUNTER — Encounter: Payer: Self-pay | Admitting: Internal Medicine

## 2012-12-18 NOTE — Addendum Note (Signed)
Addended by: Ronny Bacon on: 12/18/2012 03:54 PM   Modules accepted: Orders

## 2013-01-10 ENCOUNTER — Encounter: Payer: Self-pay | Admitting: Internal Medicine

## 2013-02-15 ENCOUNTER — Encounter: Payer: Self-pay | Admitting: Cardiology

## 2013-03-02 ENCOUNTER — Other Ambulatory Visit: Payer: Self-pay | Admitting: Nurse Practitioner

## 2013-03-02 DIAGNOSIS — R109 Unspecified abdominal pain: Secondary | ICD-10-CM

## 2013-03-05 ENCOUNTER — Ambulatory Visit
Admission: RE | Admit: 2013-03-05 | Discharge: 2013-03-05 | Disposition: A | Discharge status: Home / Self Care | Payer: Medicare Other | Source: Ambulatory Visit | Attending: Nurse Practitioner | Admitting: Nurse Practitioner

## 2013-03-05 ENCOUNTER — Other Ambulatory Visit: Payer: Self-pay | Admitting: Nurse Practitioner

## 2013-03-05 DIAGNOSIS — R109 Unspecified abdominal pain: Secondary | ICD-10-CM

## 2013-03-22 ENCOUNTER — Ambulatory Visit (INDEPENDENT_AMBULATORY_CARE_PROVIDER_SITE_OTHER): Payer: Medicare Other | Admitting: Surgery

## 2013-03-22 ENCOUNTER — Encounter (INDEPENDENT_AMBULATORY_CARE_PROVIDER_SITE_OTHER): Payer: Self-pay | Admitting: Surgery

## 2013-03-22 ENCOUNTER — Other Ambulatory Visit (INDEPENDENT_AMBULATORY_CARE_PROVIDER_SITE_OTHER): Payer: Self-pay | Admitting: Surgery

## 2013-03-22 VITALS — BP 132/88 | HR 71 | Temp 97.9°F | Resp 16 | Ht 65.0 in | Wt 195.4 lb

## 2013-03-22 DIAGNOSIS — K4090 Unilateral inguinal hernia, without obstruction or gangrene, not specified as recurrent: Secondary | ICD-10-CM

## 2013-03-22 DIAGNOSIS — K429 Umbilical hernia without obstruction or gangrene: Secondary | ICD-10-CM

## 2013-03-22 NOTE — Patient Instructions (Signed)
[facility]  HERNIA REPAIR POST OP INSTRUCTIONS  Always review your discharge instruction sheet given to you by the facility where your surgery was performed.  1. A  prescription for pain medication may be given to you upon discharge.  Take your pain medication as prescribed.  If narcotic pain medicine is not needed, then you may take acetaminophen (Tylenol) or ibuprofen (Advil) as needed.  2. Take your usually prescribed medications unless otherwise directed.  3. If you need a refill on your pain medication, please contact your pharmacy.  They will contact our office to request authorization. Prescriptions will not be filled after 5 pm daily or on weekends.  4. You should follow a light diet the first 24 hours after arrival home, such as soup and crackers or toast.  Be sure to include plenty of fluids daily.  Resume your normal diet the day after surgery.  5. Most patients will experience some swelling and bruising around the surgical site.  Ice packs and reclining will help.  Swelling and bruising can take several days to resolve.   6. It is common to experience some constipation if taking pain medication after surgery.  Increasing fluid intake and taking a stool softener (such as Colace) will usually help or prevent this problem from occurring.  A mild laxative (Milk of Magnesia or Miralax) should be taken according to package directions if there are no bowel movements after 48 hours.  7. Unless discharge instructions indicate otherwise, you may remove your bandages 24-48 hours after surgery, and you may shower at that time.  You may have steri-strips (small skin tapes) in place directly over the incision.  These strips should be left on the skin for 7-10 days.  If your surgeon used skin glue on the incision, you may shower in 24 hours.  The glue will flake off over the next 2-3 weeks.  Any sutures or staples will be removed at the office during your follow-up  visit.  8. ACTIVITIES:  You may resume regular (light) daily activities beginning the next day-such as daily self-care, walking, climbing stairs-gradually increasing activities as tolerated.  You may have sexual intercourse when it is comfortable.  Refrain from any heavy lifting or straining until approved by your doctor.  You may drive when you are no longer taking prescription pain medication, you can comfortably wear a seatbelt, and you can safely maneuver your car and apply brakes.  9. You should see your doctor in the office for a follow-up appointment approximately 2-3 weeks after your surgery.  Make sure that you call for this appointment within a day or two after you arrive home to insure a convenient appointment time. 10.   WHEN TO CALL YOUR DOCTOR: 1. Fever greater than 101.0 2. Inability to urinate 3. Persistent nausea and/or vomiting 4. Extreme swelling or bruising 5. Continued bleeding from incision 6. Increased pain, redness, or drainage from the incision  The clinic staff is available to answer your questions during regular business hours.  Please don't hesitate to call and ask to speak to one of the nurses for clinical concerns.  If you have a medical emergency, go to the nearest emergency room or call 911.  A surgeon from [facility] is always on call for the hospital.   [facility] [address], [address], [place], [place]  [address]  [phone] ? [phone] ? FAX [phone]  [email or web address]   

## 2013-03-22 NOTE — Progress Notes (Signed)
General Surgery [facility]  Chief Complaint  Patient presents with  . New Evaluation    left inguinal hernia - referral from Levonne Lapping, NP and Dr. Kirby Funk, [email or web address]     HISTORY: Patient is an 79 year old white male referred by his primary care providers with newly diagnosed left inguinal hernia. Patient first became symptomatic approximately 3 months ago. He has had problems with pain in the left groin and intermittent episodes of constipation. Abdominal ultrasound suggested a left inguinal hernia. Physical examination confirmed a left inguinal hernia. It has remained reducible. He now is referred to surgery for repair.  Patient has had an appendectomy. He has had no other abdominal surgery. He has had no prior hernia repairs.  Past Medical History  Diagnosis Date  . SOB (shortness of breath)   . Hypercholesterolemia   . GERD (gastroesophageal reflux disease)   . Sleep apnea     with use of CPAP  . Cranial neuropathy     resolved   . Hypertension   . CAD (coronary artery disease)     s/p CABG in 2007  . Bronchitis   . BPH (benign prostatic hyperplasia)   . Blind   . Thrombocytopenia     possibly due to Lovenox  . Obesity   . Anxiety     pt. reports that he has occas. nervous episodes   . H/O echocardiogram     last echo, stress test 05/2012- pt. followed by [facility]   . Cancer     melanoma - removed L ear - 2010  . Generalized OA   . Osteoarthritis     End-stage osteoarthritis, right hip  . Anemia     Acute blood loss anemia secondary to surgery, followed fr/ Hip replacement      Current Outpatient Prescriptions  Medication Sig Dispense Refill  . aspirin 81 MG tablet Take 81 mg by mouth daily.        Marland Kitchen doxazosin (CARDURA) 4 MG tablet Take 4 mg by mouth at bedtime.       . finasteride (PROSCAR) 5 MG tablet Take 5 mg by mouth daily before breakfast.       . fish oil-omega-3 fatty acids 1000 MG capsule Take 1 g by mouth every other  day.       Marland Kitchen KLOR-CON 10 10 MEQ tablet       . lovastatin (MEVACOR) 20 MG tablet Take 20 mg by mouth at bedtime.        . metoprolol (LOPRESSOR) 50 MG tablet Take 25 mg by mouth 2 (two) times daily.      . ranitidine (ZANTAC) 75 MG tablet Take 75 mg by mouth daily before breakfast.      . silver sulfADIAZINE (SILVADENE) 1 % cream Apply 1 application topically daily.       . Tamsulosin HCl (FLOMAX) 0.4 MG CAPS Take 0.4 mg by mouth daily after supper.      . temazepam (RESTORIL) 30 MG capsule       . triamterene-hydrochlorothiazide (MAXZIDE) 75-50 MG per tablet Take 1 tablet by mouth daily before breakfast.       . triazolam (HALCION) 0.25 MG tablet Take 1 tablet (0.25 mg total) by mouth at bedtime as needed (If needed for sleep).  30 tablet  5   No current facility-administered medications for this visit.     Allergies  Allergen Reactions  . Amlodipine     Swelling of feet and legs  . Bactrim (Sulfamethoxazole W-Trimethoprim)  Constipation   . Codeine Nausea Only  . Prednisone     Patient has tolerated doses for colds. Thinks it caused swelling in the past  . Shellfish Allergy Other (See Comments)    N&V, shaking  . Sonata (Zaleplon)     Hyper feelings     Family History  Problem Relation Age of Onset  . Prostate cancer Brother   . Heart disease Brother   . Heart disease Brother      History   Social History  . Marital Status: Married    Spouse Name: N/A    Number of Children: 2  . Years of Education: N/A   Occupational History  . [employer]     retired   Social History Main Topics  . Smoking status: Never Smoker   . Smokeless tobacco: None  . Alcohol Use: No  . Drug Use: No  . Sexually Active: Not Currently   Other Topics Concern  . None   Social History Narrative  . None     REVIEW OF SYSTEMS - PERTINENT POSITIVES ONLY: Intermittent constipation. Intermittent left lower quadrant abdominal pain.  EXAM: Filed Vitals:   03/22/13 1348   BP: 132/88  Pulse: 71  Temp: 97.9 F (36.6 C)  Resp: 16    HEENT: normocephalic; pupils equal and reactive; sclerae clear; dentition fair; mucous membranes moist NECK:  symmetric on extension; no palpable anterior or posterior cervical lymphadenopathy; no supraclavicular masses; no tenderness CHEST: clear to auscultation bilaterally without rales, rhonchi, or wheezes CARDIAC: regular rate and rhythm without significant murmur; peripheral pulses are full; well-healed median sternotomy incision ABDOMEN: soft without distension; bowel sounds present; no mass; no hepatosplenomegaly; umbilical hernia, reducible, nontender GU:  Palpation in the right inguinal canal with cough and Valsalva shows some laxity to the inguinal floor but no fascial defect; palpation in the left inguinal canal with cough and Valsalva shows an obvious bulge which is at least partially reducible and moderately tender EXT:  non-tender without edema; no deformity NEURO: no gross focal deficits; no sign of tremor   LABORATORY RESULTS: See [facility] (CHL-Epic) for most recent results   RADIOLOGY RESULTS: See [facility] (CHL-Epic) for most recent results   IMPRESSION: #1 left inguinal hernia, reducible, symptomatic #2 umbilical hernia, reducible, asymptomatic #3 personal history of coronary artery disease  PLAN: I discussed left inguinal hernia repair with mesh at length with the patient and his wife. I provided him with written literature to review.  We discussed the procedure of open herniorrhaphy with mesh. We discussed options for surgical management.  We discussed the risk of recurrence. We discussed restrictions on his activities following the procedure. He understands and wishes to proceed. We will make arrangements for surgery in the near future at [facility].  The risks and benefits of the procedure have been discussed at length with the patient.  The patient understands the proposed  procedure, potential alternative treatments, and the course of recovery to be expected.  All of the patient's questions have been answered at this time.  The patient wishes to proceed with surgery.  Velora Heckler, MD, FACS General & Endocrine Surgery [facility]   Visit Diagnoses: 1. Inguinal hernia unilateral, non-recurrent, left   2. Umbilical hernia, asymptomatic     Primary Care Physician: Lillia Mountain, MD

## 2013-04-26 ENCOUNTER — Encounter (HOSPITAL_COMMUNITY): Payer: Self-pay | Admitting: Pharmacy Technician

## 2013-05-03 ENCOUNTER — Encounter (HOSPITAL_COMMUNITY): Payer: Self-pay

## 2013-05-03 ENCOUNTER — Encounter (HOSPITAL_COMMUNITY)
Admission: RE | Admit: 2013-05-03 | Discharge: 2013-05-03 | Disposition: A | Discharge status: Home / Self Care | Payer: Medicare Other | Source: Ambulatory Visit | Attending: Surgery | Admitting: Surgery

## 2013-05-03 LAB — SURGICAL PCR SCREEN
MRSA, PCR: NEGATIVE
Staphylococcus aureus: NEGATIVE

## 2013-05-03 LAB — BASIC METABOLIC PANEL WITH GFR
BUN: 16 mg/dL (ref 6–23)
CO2: 32 meq/L (ref 19–32)
Calcium: 9.8 mg/dL (ref 8.4–10.5)
Chloride: 100 meq/L (ref 96–112)
Creatinine, Ser: 1.07 mg/dL (ref 0.50–1.35)
GFR calc Af Amer: 73 mL/min — ABNORMAL LOW
GFR calc non Af Amer: 63 mL/min — ABNORMAL LOW
Glucose, Bld: 106 mg/dL — ABNORMAL HIGH (ref 70–99)
Potassium: 3.6 meq/L (ref 3.5–5.1)
Sodium: 139 meq/L (ref 135–145)

## 2013-05-03 LAB — CBC
MCHC: 33.4 g/dL (ref 30.0–36.0)
MCV: 95.1 fL (ref 78.0–100.0)
Platelets: 109 10*3/uL — ABNORMAL LOW (ref 150–400)
RDW: 13.1 % (ref 11.5–15.5)
WBC: 6.3 10*3/uL (ref 4.0–10.5)

## 2013-05-03 NOTE — Pre-Procedure Instructions (Signed)
PT'S PREOP PLATELET CT 109,000 - PREOP CBC REPORT WAS REVIEWED BY DR. GERKIN  IN EPIC AND ACCEPTABLE FOR SURGERY.

## 2013-05-03 NOTE — Pre-Procedure Instructions (Signed)
CXR REPORT IN EPIC FROM 10/05/12. EKG REPORT IN EPIC FROM 12/11/12.  CARDIOLOGY OFFICE NOTES 06/19/12 IN EPIC FROM Norma Fredrickson, NP FOR DR. P. [place] ANESTHESIOLOGIST CONSULT COMPLETED TODAY BY DR. Rica Mast - AS PER REQUEST DR. GERKIN.

## 2013-05-03 NOTE — Progress Notes (Signed)
Quick Note:  These results are acceptable for scheduled surgery.  Airiel Oblinger M. Danique Hartsough, MD, FACS [facility] Office: [phone]   ______ 

## 2013-05-03 NOTE — Anesthesia Preprocedure Evaluation (Addendum)
Anesthesia Evaluation  Patient identified by MRN, date of birth, ID band Patient awake    Reviewed: Allergy & Precautions, H&P , NPO status , Patient's Chart, lab work & pertinent test results, reviewed documented beta blocker date and time   Airway Mallampati: III TM Distance: >3 FB Neck ROM: Full    Dental no notable dental hx. (+) Chipped, Teeth Intact and Dental Advisory Given,    Pulmonary shortness of breath and with exertion, sleep apnea and Continuous Positive Airway Pressure Ventilation ,  breath sounds clear to auscultation  Pulmonary exam normal       Cardiovascular hypertension, Pt. on home beta blockers + angina (infrequent interval) + CAD, + Cardiac Stents (Angioplasty  X3 prior to CABG), + CABG (CABG 2007) and + DOE Rhythm:Regular Rate:Normal  EST in 2010 negative   Neuro/Psych negative neurological ROS  negative psych ROS   GI/Hepatic negative GI ROS, Neg liver ROS, GERD-  Medicated and Controlled,  Endo/Other  negative endocrine ROS  Renal/GU negative Renal ROS  negative genitourinary   Musculoskeletal negative musculoskeletal ROS (+)   Abdominal   Peds  Hematology negative hematology ROS (+)   Anesthesia Other Findings Prior ankle surgery in 12/2012; stated he had difficulty breathing upon emergence from general anesthesia.  Reproductive/Obstetrics negative OB ROS                        Anesthesia Physical Anesthesia Plan  ASA: III  Anesthesia Plan: General   Post-op Pain Management:    Induction: Intravenous  Airway Management Planned: Oral ETT  Additional Equipment:   Intra-op Plan:   Post-operative Plan: Extubation in OR  Informed Consent: I have reviewed the patients History and Physical, chart, labs and discussed the procedure including the risks, benefits and alternatives for the proposed anesthesia with the patient or authorized representative who has indicated  his/her understanding and acceptance.   Dental advisory given  Plan Discussed with: CRNA  Anesthesia Plan Comments:         Anesthesia Quick Evaluation

## 2013-05-03 NOTE — Patient Instructions (Signed)
YOUR SURGERY IS SCHEDULED AT [facility]  ON:   tHURSDAY  June 5 th  REPORT TO [facility] AT:  5:30 AM      PHONE # FOR SHORT STAY IS [phone]  DO NOT EAT OR DRINK ANYTHING AFTER MIDNIGHT THE NIGHT BEFORE YOUR SURGERY.  YOU MAY BRUSH YOUR TEETH, RINSE OUT YOUR MOUTH--BUT NO WATER, NO FOOD, NO CHEWING GUM, NO MINTS, NO CANDIES, NO CHEWING TOBACCO.  PLEASE TAKE THE FOLLOWING MEDICATIONS THE AM OF YOUR SURGERY WITH A FEW SIPS OF WATER:  PROSCAR, METOPROLOL, ZANTAC    IF YOU HAVE SLEEP APNEA AND USE CPAP OR BIPAP--PLEASE BRING THE MASK AND THE TUBING.  DO NOT BRING YOUR MACHINE.  DO NOT BRING VALUABLES, MONEY, CREDIT CARDS.  DO NOT WEAR JEWELRY, MAKE-UP, NAIL POLISH AND NO METAL PINS OR CLIPS IN YOUR HAIR. CONTACT LENS, DENTURES / PARTIALS, GLASSES SHOULD NOT BE WORN TO SURGERY AND IN MOST CASES-HEARING AIDS WILL NEED TO BE REMOVED.  BRING YOUR GLASSES CASE, ANY EQUIPMENT NEEDED FOR YOUR CONTACT LENS. FOR PATIENTS ADMITTED TO THE HOSPITAL--CHECK OUT TIME THE DAY OF DISCHARGE IS 11:00 AM.  ALL INPATIENT ROOMS ARE PRIVATE - WITH BATHROOM, TELEPHONE, TELEVISION AND WIFI INTERNET.  IF YOU ARE BEING DISCHARGED THE SAME DAY OF YOUR SURGERY--YOU CAN NOT DRIVE YOURSELF HOME--AND SHOULD NOT GO HOME ALONE BY TAXI OR BUS.  NO DRIVING OR OPERATING MACHINERY FOR 24 HOURS FOLLOWING ANESTHESIA / PAIN MEDICATIONS.  PLEASE MAKE ARRANGEMENTS FOR SOMEONE TO BE WITH YOU AT HOME THE FIRST 24 HOURS AFTER SURGERY. RESPONSIBLE DRIVER'S NAME  Scott [name] - PT'S WIFE                                               PHONE #  [phone]                            PLEASE READ OVER ANY  FACT SHEETS THAT YOU WERE GIVEN: MRSA INFORMATION, BLOOD TRANSFUSION INFORMATION, INCENTIVE SPIROMETER INFORMATION. FAILURE TO FOLLOW THESE INSTRUCTIONS MAY RESULT IN THE CANCELLATION OF YOUR SURGERY.   PATIENT SIGNATURE_________________________________

## 2013-05-10 ENCOUNTER — Telehealth (INDEPENDENT_AMBULATORY_CARE_PROVIDER_SITE_OTHER): Payer: Self-pay

## 2013-05-10 ENCOUNTER — Encounter (HOSPITAL_COMMUNITY): Payer: Self-pay | Admitting: Anesthesiology

## 2013-05-10 ENCOUNTER — Encounter (HOSPITAL_COMMUNITY): Payer: Self-pay | Admitting: *Deleted

## 2013-05-10 ENCOUNTER — Encounter (HOSPITAL_COMMUNITY)
Admission: RE | Disposition: A | Discharge status: Home / Self Care | Payer: Self-pay | Source: Ambulatory Visit | Attending: Surgery

## 2013-05-10 ENCOUNTER — Ambulatory Visit (HOSPITAL_COMMUNITY): Payer: Medicare Other | Admitting: Anesthesiology

## 2013-05-10 ENCOUNTER — Ambulatory Visit (HOSPITAL_COMMUNITY)
Admission: RE | Admit: 2013-05-10 | Discharge: 2013-05-10 | Disposition: A | Discharge status: Home / Self Care | Payer: Medicare Other | Source: Ambulatory Visit | Attending: Surgery | Admitting: Surgery

## 2013-05-10 DIAGNOSIS — E669 Obesity, unspecified: Secondary | ICD-10-CM | POA: Insufficient documentation

## 2013-05-10 DIAGNOSIS — Z01812 Encounter for preprocedural laboratory examination: Secondary | ICD-10-CM | POA: Insufficient documentation

## 2013-05-10 DIAGNOSIS — Z96649 Presence of unspecified artificial hip joint: Secondary | ICD-10-CM | POA: Insufficient documentation

## 2013-05-10 DIAGNOSIS — K4090 Unilateral inguinal hernia, without obstruction or gangrene, not specified as recurrent: Secondary | ICD-10-CM

## 2013-05-10 DIAGNOSIS — Z85820 Personal history of malignant melanoma of skin: Secondary | ICD-10-CM | POA: Insufficient documentation

## 2013-05-10 DIAGNOSIS — I2510 Atherosclerotic heart disease of native coronary artery without angina pectoris: Secondary | ICD-10-CM | POA: Insufficient documentation

## 2013-05-10 DIAGNOSIS — Z79899 Other long term (current) drug therapy: Secondary | ICD-10-CM | POA: Insufficient documentation

## 2013-05-10 DIAGNOSIS — E7800 Pure hypercholesterolemia, unspecified: Secondary | ICD-10-CM | POA: Insufficient documentation

## 2013-05-10 DIAGNOSIS — Z951 Presence of aortocoronary bypass graft: Secondary | ICD-10-CM | POA: Insufficient documentation

## 2013-05-10 DIAGNOSIS — I10 Essential (primary) hypertension: Secondary | ICD-10-CM | POA: Insufficient documentation

## 2013-05-10 DIAGNOSIS — G4730 Sleep apnea, unspecified: Secondary | ICD-10-CM | POA: Insufficient documentation

## 2013-05-10 DIAGNOSIS — K219 Gastro-esophageal reflux disease without esophagitis: Secondary | ICD-10-CM | POA: Insufficient documentation

## 2013-05-10 HISTORY — PX: INSERTION OF MESH: SHX5868

## 2013-05-10 HISTORY — PX: INGUINAL HERNIA REPAIR: SHX194

## 2013-05-10 SURGERY — REPAIR, HERNIA, INGUINAL, ADULT
Anesthesia: General | Site: Abdomen | Laterality: Left | Wound class: Clean

## 2013-05-10 MED ORDER — BUPIVACAINE HCL 0.5 % IJ SOLN
INTRAMUSCULAR | Status: AC
  Filled 2013-05-10: qty 1

## 2013-05-10 MED ORDER — CISATRACURIUM BESYLATE (PF) 10 MG/5ML IV SOLN
INTRAVENOUS | Status: DC | PRN
  Administered 2013-05-10: 6 mg via INTRAVENOUS

## 2013-05-10 MED ORDER — LACTATED RINGERS IV SOLN
INTRAVENOUS | Status: DC
  Administered 2013-05-10: 11:00:00 via INTRAVENOUS

## 2013-05-10 MED ORDER — HYDROCODONE-ACETAMINOPHEN 5-325 MG PO TABS: 1.0000 | ORAL_TABLET | ORAL | Status: DC | PRN

## 2013-05-10 MED ORDER — LACTATED RINGERS IV SOLN
INTRAVENOUS | Status: DC | PRN
  Administered 2013-05-10: 07:00:00 via INTRAVENOUS

## 2013-05-10 MED ORDER — GLYCOPYRROLATE 0.2 MG/ML IJ SOLN
INTRAMUSCULAR | Status: DC | PRN
  Administered 2013-05-10: .6 mg via INTRAVENOUS

## 2013-05-10 MED ORDER — PROPOFOL 10 MG/ML IV BOLUS
INTRAVENOUS | Status: DC | PRN
  Administered 2013-05-10: 150 mg via INTRAVENOUS

## 2013-05-10 MED ORDER — LACTATED RINGERS IV SOLN: INTRAVENOUS | Status: DC

## 2013-05-10 MED ORDER — ACETAMINOPHEN 10 MG/ML IV SOLN
INTRAVENOUS | Status: AC
  Filled 2013-05-10: qty 100

## 2013-05-10 MED ORDER — ACETAMINOPHEN 10 MG/ML IV SOLN
INTRAVENOUS | Status: DC | PRN
  Administered 2013-05-10: 1000 mg via INTRAVENOUS

## 2013-05-10 MED ORDER — EPHEDRINE SULFATE 50 MG/ML IJ SOLN
INTRAMUSCULAR | Status: DC | PRN
  Administered 2013-05-10 (×2): 10 mg via INTRAVENOUS

## 2013-05-10 MED ORDER — LIDOCAINE HCL (CARDIAC) 20 MG/ML IV SOLN
INTRAVENOUS | Status: DC | PRN
  Administered 2013-05-10: 80 mg via INTRAVENOUS

## 2013-05-10 MED ORDER — HYDROCODONE-ACETAMINOPHEN 5-325 MG PO TABS
1.0000 | ORAL_TABLET | ORAL | Status: DC | PRN
  Administered 2013-05-10: 1 via ORAL
  Filled 2013-05-10: qty 1

## 2013-05-10 MED ORDER — CEFAZOLIN SODIUM-DEXTROSE 2-3 GM-% IV SOLR
2.0000 g | INTRAVENOUS | Status: AC
  Administered 2013-05-10: 2 g via INTRAVENOUS

## 2013-05-10 MED ORDER — SUCCINYLCHOLINE CHLORIDE 20 MG/ML IJ SOLN
INTRAMUSCULAR | Status: DC | PRN
  Administered 2013-05-10: 100 mg via INTRAVENOUS

## 2013-05-10 MED ORDER — CEFAZOLIN SODIUM-DEXTROSE 2-3 GM-% IV SOLR
INTRAVENOUS | Status: AC
  Filled 2013-05-10: qty 50

## 2013-05-10 MED ORDER — FENTANYL CITRATE 0.05 MG/ML IJ SOLN: 25.0000 ug | INTRAMUSCULAR | Status: DC | PRN

## 2013-05-10 MED ORDER — FENTANYL CITRATE 0.05 MG/ML IJ SOLN
INTRAMUSCULAR | Status: DC | PRN
  Administered 2013-05-10: 100 ug via INTRAVENOUS

## 2013-05-10 MED ORDER — BUPIVACAINE HCL (PF) 0.5 % IJ SOLN
INTRAMUSCULAR | Status: DC | PRN
  Administered 2013-05-10: 20 mL

## 2013-05-10 MED ORDER — 0.9 % SODIUM CHLORIDE (POUR BTL) OPTIME
TOPICAL | Status: DC | PRN
  Administered 2013-05-10: 1000 mL

## 2013-05-10 MED ORDER — NEOSTIGMINE METHYLSULFATE 1 MG/ML IJ SOLN
INTRAMUSCULAR | Status: DC | PRN
  Administered 2013-05-10: 4 mg via INTRAVENOUS

## 2013-05-10 SURGICAL SUPPLY — 36 items
APPLICATOR COTTON TIP 6IN STRL (MISCELLANEOUS) IMPLANT
BENZOIN TINCTURE PRP APPL 2/3 (GAUZE/BANDAGES/DRESSINGS) ×2 IMPLANT
BLADE HEX COATED 2.75 (ELECTRODE) ×2 IMPLANT
BLADE SURG 15 STRL LF DISP TIS (BLADE) ×1 IMPLANT
BLADE SURG 15 STRL SS (BLADE) ×1
BLADE SURG SZ10 CARB STEEL (BLADE) IMPLANT
CANISTER SUCTION 2500CC (MISCELLANEOUS) ×2 IMPLANT
CLOTH BEACON ORANGE TIMEOUT ST (SAFETY) ×2 IMPLANT
DECANTER SPIKE VIAL GLASS SM (MISCELLANEOUS) ×2 IMPLANT
DRAIN PENROSE 18X1/2 LTX STRL (DRAIN) ×2 IMPLANT
DRAPE LAPAROTOMY TRNSV 102X78 (DRAPE) ×2 IMPLANT
ELECT REM PT RETURN 9FT ADLT (ELECTROSURGICAL) ×2
ELECTRODE REM PT RTRN 9FT ADLT (ELECTROSURGICAL) ×1 IMPLANT
GLOVE BIOGEL PI IND STRL 7.0 (GLOVE) ×1 IMPLANT
GLOVE BIOGEL PI INDICATOR 7.0 (GLOVE) ×1
GLOVE SURG ORTHO 8.0 STRL STRW (GLOVE) ×2 IMPLANT
GOWN STRL NON-REIN LRG LVL3 (GOWN DISPOSABLE) ×2 IMPLANT
GOWN STRL REIN XL XLG (GOWN DISPOSABLE) ×4 IMPLANT
KIT BASIN OR (CUSTOM PROCEDURE TRAY) ×2 IMPLANT
MESH ULTRAPRO 3X6 7.6X15CM (Mesh General) ×2 IMPLANT
NEEDLE HYPO 25X1 1.5 SAFETY (NEEDLE) ×2 IMPLANT
NS IRRIG 1000ML POUR BTL (IV SOLUTION) ×2 IMPLANT
PACK BASIC VI WITH GOWN DISP (CUSTOM PROCEDURE TRAY) ×2 IMPLANT
PENCIL BUTTON HOLSTER BLD 10FT (ELECTRODE) ×2 IMPLANT
SPONGE GAUZE 4X4 12PLY (GAUZE/BANDAGES/DRESSINGS) ×2 IMPLANT
SPONGE LAP 4X18 X RAY DECT (DISPOSABLE) ×4 IMPLANT
STRIP CLOSURE SKIN 1/2X4 (GAUZE/BANDAGES/DRESSINGS) ×2 IMPLANT
SUT MNCRL AB 4-0 PS2 18 (SUTURE) ×2 IMPLANT
SUT NOVA NAB GS-22 2 0 T19 (SUTURE) ×4 IMPLANT
SUT PROLENE 0 SH 30 (SUTURE) ×4 IMPLANT
SUT SILK 2 0 SH (SUTURE) ×2 IMPLANT
SUT VIC AB 3-0 SH 18 (SUTURE) ×2 IMPLANT
SYR BULB IRRIGATION 50ML (SYRINGE) ×2 IMPLANT
SYR CONTROL 10ML LL (SYRINGE) ×2 IMPLANT
TOWEL OR 17X26 10 PK STRL BLUE (TOWEL DISPOSABLE) ×2 IMPLANT
YANKAUER SUCT BULB TIP 10FT TU (MISCELLANEOUS) ×2 IMPLANT

## 2013-05-10 NOTE — Preoperative (Signed)
Beta Blockers   Reason not to administer Beta Blockers:Not Applicable 

## 2013-05-10 NOTE — Op Note (Signed)
Inguinal Hernia, Open, Procedure Note  Pre-operative Diagnosis:  Left inguinal hernia, reducible  Post-operative Diagnosis: same  Surgeon:  Velora Heckler, MD, FACS  Anesthesia:  General  Indications: The patient presented with a left, reducible hernia.    Procedure Details  The patient was seen again in the Holding Room. The risks, benefits, complications, treatment options, and expected outcomes were discussed with the patient.  There was concurrence with the proposed plan, and informed consent was obtained. The site of surgery was properly noted/marked. The patient was taken to the Operating Room, identified by name, and the procedure verified as hernia repair. A Time Out was held and the above information confirmed.  The patient was placed in the supine position and underwent induction of anesthesia.  The lower abdomen and groin was prepped and draped in the usual strict aseptic fashion.  After ascertaining that an adequate level of anesthesia had been obtained, and incision is made in the groin with a #10 blade.  Dissection is carried through the subcutaneous tissues and hemostasis obtained with the electrocautery.  A Gelpi retractor is placed for exposure.  The external oblique fascia is incised in line with it's fibers and extended through the external inguinal ring.  The cord structures are dissected out of the inguinal canal and encircled with a Penrose drain.  The floor of the inguinal canal is dissected out.  The cord is explored and a sliding type hernia is identified containing what appears to be sigmoid colon.  This is reduced along with adipose tissue.  The internal ring is closed with interrupted 0 Prolene sutures.  The floor of the inguinal canal is reconstructed with a sheet of mesh cut to the appropriate dimensions.  It is secured to the pubic tubercle with a 2-0 Novafil suture and along the inguinal ligament with a running 2-0 Novafil suture.  Mesh is split to accommodate the  cord structures.  The superior edge of the mesh is secured to the transversalis and internal oblique muscles with interrupted 0 Prolene sutures.  The fascia is thin and attenuated so wide bites of tissue are taken to secure the mesh.  The tails of the mesh are overlapped lateral to the cord structures and secured to the inguinal ligament with interrupted 0 Prolene sutures to recreate the internal inguinal ring.  Cord structures are returned to the inguinal canal.  Local anesthetic is infiltrated throughout the field.  Subcutaneous tissues are closed with interrupted 3-0 Vicryl sutures.  Skin is anesthetized with local anesthetic, and the skin edges re-approximated with a running 4-0 Monocryl suture.  Wound is washed and dried and benzoin and steristrips are applied.  A gauze dressing is then applied.  Instrument, sponge, and needle counts were correct prior to closure and at the conclusion of the case.  Velora Heckler, MD, FACS General & Endocrine Surgery [facility]   Findings: Hernia as above  Estimated Blood Loss: Minimal         Specimens: None  Complications: None; patient tolerated the procedure well.         Disposition: PACU - hemodynamically stable.         Condition: stable

## 2013-05-10 NOTE — Transfer of Care (Signed)
Immediate Anesthesia Transfer of Care Note  Patient: Scott [name]  Procedure(s) Performed: Procedure(s): HERNIA REPAIR INGUINAL ADULT (Left) INSERTION OF MESH (Left)  Patient Location: PACU  Anesthesia Type:General  Level of Consciousness: awake, alert  and oriented  Airway & Oxygen Therapy: Patient Spontanous Breathing and Patient connected to face mask oxygen  Post-op Assessment: Report given to PACU RN and Post -op Vital signs reviewed and stable  Post vital signs: Reviewed and stable  Complications: No apparent anesthesia complications

## 2013-05-10 NOTE — Telephone Encounter (Signed)
LMOM with appt date and to call with any concerns.

## 2013-05-10 NOTE — H&P (Signed)
Scott [name] is an 79 y.o. male.    General Surgery [facility]  Chief Complaint: left inguinal hernia  HPI: Patient is an 79 year old white male referred by his primary care providers with newly diagnosed left inguinal hernia. Patient first became symptomatic approximately 3 months ago. He has had problems with pain in the left groin and intermittent episodes of constipation. Abdominal ultrasound suggested a left inguinal hernia. Physical examination confirmed a left inguinal hernia. It has remained reducible. He now is referred to surgery for repair.  Patient has had an appendectomy. He has had no other abdominal surgery. He has had no prior hernia repairs.  Past Medical History  Diagnosis Date  . Hypercholesterolemia   . GERD (gastroesophageal reflux disease)   . Sleep apnea     with use of CPAP  . Cranial neuropathy     resolved   . Hypertension   . BPH (benign prostatic hyperplasia)   . Thrombocytopenia     possibly due to Lovenox  . Obesity   . Anxiety     pt. reports that he has occas. nervous episodes   . H/O echocardiogram     last echo, stress test 05/2012- pt. followed by [place]   . Cancer     melanoma - removed L ear - 2010  . Anemia     Acute blood loss anemia secondary to surgery, followed fr/ Hip replacement   . CAD (coronary artery disease)     s/p CABG in 2007  CARDILOLOGIST IS DR. P. [place]  . SOB (shortness of breath)     WITH EXERTION  . Generalized OA   . Osteoarthritis     End-stage osteoarthritis, right hip    Past Surgical History  Procedure Laterality Date  . Coronary artery bypass graft  2007    LIMA GRAFT TO THE LAD, SAPHENOUS VEIN GRAFT TO THE FIRST OBTUSE MARIGNAL, SAPHENOUS VEIN GRAFT TO THE SECOND OBTUSE MARGINAL AND A SAPHENOUS VEIN GRAFT TO THE ACUTE MARGINAL AND POSTERIOR LATERAL BRANCH  . Total hip arthroplasty      right  . Appendectomy    . Carpel tunnel surgery-bilateral      both hands   . Knee  11/12     left arthroscopic  . Cardiac catheterization  12/15/2005    NORMAL. EF 60%, angioplasty 1995  . Artery biopsy      temporal - L side- wnl, double vision treated /w prednisone - 1991  . Achilles tendon surgery  10/06/2012    Procedure: ACHILLES TENDON REPAIR;  Surgeon: Thera Flake., MD;  Location: [facility] OR;  Service: Orthopedics;  Laterality: Right;  REPAIR RIGHT RUPTURE ACHILLES TENDON PRIMARY OPEN/PERCUTANEOUS, EXCISION PARTIAL BONE TALUS/CANCANEUS, REPAIR PARTIAL EXCISION CALCANEUS  . Calcaneal osteotomy  10/06/2012    Procedure: CALCANEAL OSTEOTOMY;  Surgeon: Thera Flake., MD;  Location: [facility] OR;  Service: Orthopedics;  Laterality: Right;  . I&d extremity  12/11/2012    Procedure: IRRIGATION AND DEBRIDEMENT EXTREMITY;  Surgeon: Nadara Mustard, MD;  Location: [facility] OR;  Service: Orthopedics;  Laterality: Right;  Irrigation and Debridement achilles, wound closure, apply wound vac, place antibiotic beads  . Joint replacement      Family History  Problem Relation Age of Onset  . Prostate cancer Brother   . Heart disease Brother   . Heart disease Brother    Social History:  reports that he has never smoked. He has never used smokeless tobacco. He reports that he does not  drink alcohol or use illicit drugs.  Allergies:  Allergies  Allergen Reactions  . Amlodipine     Swelling of feet and legs  . Bactrim (Sulfamethoxazole W-Trimethoprim)     Constipation   . Codeine Nausea Only  . Lovenox (Enoxaparin Sodium)     MAY HAVE CAUSED THROMBOCYTOPENIA  . Prednisone     Patient has tolerated doses for colds. Thinks it caused swelling in the past  . Shellfish Allergy Other (See Comments)    N&V, shaking  . Sonata (Zaleplon)     Hyper feelings    Medications Prior to Admission  Medication Sig Dispense Refill  . aspirin 81 MG tablet Take 81 mg by mouth daily.       . calcium carbonate (OS-CAL) 600 MG TABS Take 600 mg by mouth daily.      . Calcium Citrate (CITRACAL PO) Take 1 tablet by mouth  daily.      Marland Kitchen doxazosin (CARDURA) 4 MG tablet Take 4 mg by mouth at bedtime.       . finasteride (PROSCAR) 5 MG tablet Take 5 mg by mouth daily before breakfast.       . fish oil-omega-3 fatty acids 1000 MG capsule Take 1 g by mouth every other day.       . lovastatin (MEVACOR) 20 MG tablet Take 20 mg by mouth at bedtime.       . metoprolol (LOPRESSOR) 50 MG tablet Take 25 mg by mouth 2 (two) times daily.      . Multiple Vitamins-Minerals (EYE VITAMINS PO) Take 1 tablet by mouth daily.      . potassium chloride (K-DUR,KLOR-CON) 10 MEQ tablet Take 10 mEq by mouth daily.      . ranitidine (ZANTAC) 75 MG tablet Take 75 mg by mouth daily before breakfast.      . Tamsulosin HCl (FLOMAX) 0.4 MG CAPS Take 0.4 mg by mouth daily after supper.      . temazepam (RESTORIL) 30 MG capsule Take 30 mg by mouth at bedtime as needed for sleep.       Marland Kitchen triamterene-hydrochlorothiazide (MAXZIDE) 75-50 MG per tablet Take 1 tablet by mouth daily before breakfast.       . triazolam (HALCION) 0.25 MG tablet Take 1 tablet (0.25 mg total) by mouth at bedtime as needed (If needed for sleep).  30 tablet  5  . tetrahydrozoline 0.05 % ophthalmic solution Place 1 drop into both eyes daily as needed (for dry eyes).        No results found for this or any previous visit (from the past 48 hour(s)). No results found.  Review of Systems  Constitutional: Negative.   HENT: Negative.   Eyes: Negative.   Respiratory: Negative.   Cardiovascular: Negative.   Gastrointestinal: Positive for constipation.  Genitourinary: Negative.   Musculoskeletal: Negative.   Skin: Negative.   Neurological: Negative.   Endo/Heme/Allergies: Negative.   Psychiatric/Behavioral: Negative.     Blood pressure 151/79, pulse 58, temperature 98.7 F (37.1 C), temperature source Oral, resp. rate 18, SpO2 99.00%. Physical Exam  Constitutional: He is oriented to person, place, and time. He appears well-developed and well-nourished. No distress.   HENT:  Head: Normocephalic and atraumatic.  Right Ear: External ear normal.  Left Ear: External ear normal.  Mouth/Throat: Oropharynx is clear and moist.  Eyes: Conjunctivae are normal. Pupils are equal, round, and reactive to light.  Neck: Normal range of motion. Neck supple. No tracheal deviation present. No thyromegaly present.  Cardiovascular: Normal  rate, regular rhythm and normal heart sounds.   Respiratory: Effort normal and breath sounds normal. He has no wheezes.  GI: Soft. Bowel sounds are normal.  Genitourinary:  Left inguinal hernia, reducible  Musculoskeletal: Normal range of motion. He exhibits no edema.  Neurological: He is alert and oriented to person, place, and time.  Skin: Skin is warm and dry.  Psychiatric: He has a normal mood and affect. His behavior is normal.     Assessment/Plan Left inguinal hernia  Plan repair with mesh  The risks and benefits of the procedure have been discussed at length with the patient.  The patient understands the proposed procedure, potential alternative treatments, and the course of recovery to be expected.  All of the patient's questions have been answered at this time.  The patient wishes to proceed with surgery.  Velora Heckler, MD, [facility] Office: [phone]    Scott [name] Judie Petit 05/10/2013, 7:15 AM

## 2013-05-10 NOTE — Anesthesia Postprocedure Evaluation (Signed)
  Anesthesia Post-op Note  Patient: Scott [name]  Procedure(s) Performed: Procedure(s) (LRB): HERNIA REPAIR INGUINAL ADULT (Left) INSERTION OF MESH (Left)  Patient Location: PACU  Anesthesia Type: General  Level of Consciousness: awake and alert   Airway and Oxygen Therapy: Patient Spontanous Breathing  Post-op Pain: mild  Post-op Assessment: Post-op Vital signs reviewed, Patient's Cardiovascular Status Stable, Respiratory Function Stable, Patent Airway and No signs of Nausea or vomiting  Last Vitals:  Filed Vitals:   05/10/13 0930  BP: 117/81  Pulse:   Temp:   Resp:     Post-op Vital Signs: stable   Complications: No apparent anesthesia complications

## 2013-05-11 ENCOUNTER — Encounter (HOSPITAL_COMMUNITY): Payer: Self-pay | Admitting: Surgery

## 2013-05-12 ENCOUNTER — Emergency Department (HOSPITAL_BASED_OUTPATIENT_CLINIC_OR_DEPARTMENT_OTHER): Payer: Medicare Other

## 2013-05-12 ENCOUNTER — Encounter (HOSPITAL_BASED_OUTPATIENT_CLINIC_OR_DEPARTMENT_OTHER): Payer: Self-pay

## 2013-05-12 ENCOUNTER — Inpatient Hospital Stay (HOSPITAL_BASED_OUTPATIENT_CLINIC_OR_DEPARTMENT_OTHER)
Admission: EM | Admit: 2013-05-12 | Discharge: 2013-05-14 | DRG: 394 | Disposition: A | Discharge status: Home / Self Care | Payer: Medicare Other | Attending: Surgery | Admitting: Surgery

## 2013-05-12 DIAGNOSIS — Z951 Presence of aortocoronary bypass graft: Secondary | ICD-10-CM

## 2013-05-12 DIAGNOSIS — K219 Gastro-esophageal reflux disease without esophagitis: Secondary | ICD-10-CM | POA: Diagnosis present

## 2013-05-12 DIAGNOSIS — K929 Disease of digestive system, unspecified: Principal | ICD-10-CM | POA: Diagnosis present

## 2013-05-12 DIAGNOSIS — G4730 Sleep apnea, unspecified: Secondary | ICD-10-CM | POA: Diagnosis present

## 2013-05-12 DIAGNOSIS — M1610 Unilateral primary osteoarthritis, unspecified hip: Secondary | ICD-10-CM | POA: Diagnosis present

## 2013-05-12 DIAGNOSIS — Y838 Other surgical procedures as the cause of abnormal reaction of the patient, or of later complication, without mention of misadventure at the time of the procedure: Secondary | ICD-10-CM | POA: Diagnosis present

## 2013-05-12 DIAGNOSIS — R109 Unspecified abdominal pain: Secondary | ICD-10-CM

## 2013-05-12 DIAGNOSIS — K668 Other specified disorders of peritoneum: Secondary | ICD-10-CM

## 2013-05-12 DIAGNOSIS — Z9889 Other specified postprocedural states: Secondary | ICD-10-CM

## 2013-05-12 DIAGNOSIS — K6389 Other specified diseases of intestine: Secondary | ICD-10-CM | POA: Diagnosis present

## 2013-05-12 DIAGNOSIS — K560 Paralytic ileus: Secondary | ICD-10-CM | POA: Diagnosis present

## 2013-05-12 DIAGNOSIS — Z96649 Presence of unspecified artificial hip joint: Secondary | ICD-10-CM

## 2013-05-12 DIAGNOSIS — E669 Obesity, unspecified: Secondary | ICD-10-CM | POA: Diagnosis present

## 2013-05-12 DIAGNOSIS — E7800 Pure hypercholesterolemia, unspecified: Secondary | ICD-10-CM | POA: Diagnosis present

## 2013-05-12 DIAGNOSIS — Z6831 Body mass index (BMI) 31.0-31.9, adult: Secondary | ICD-10-CM

## 2013-05-12 DIAGNOSIS — I2510 Atherosclerotic heart disease of native coronary artery without angina pectoris: Secondary | ICD-10-CM | POA: Diagnosis present

## 2013-05-12 DIAGNOSIS — F411 Generalized anxiety disorder: Secondary | ICD-10-CM | POA: Diagnosis present

## 2013-05-12 DIAGNOSIS — I10 Essential (primary) hypertension: Secondary | ICD-10-CM | POA: Diagnosis present

## 2013-05-12 DIAGNOSIS — M169 Osteoarthritis of hip, unspecified: Secondary | ICD-10-CM | POA: Diagnosis present

## 2013-05-12 LAB — COMPREHENSIVE METABOLIC PANEL
BUN: 11 mg/dL (ref 6–23)
Calcium: 9.4 mg/dL (ref 8.4–10.5)
GFR calc Af Amer: 89 mL/min — ABNORMAL LOW (ref >90)
GFR calc non Af Amer: 77 mL/min — ABNORMAL LOW (ref >90)
Glucose, Bld: 124 mg/dL — ABNORMAL HIGH (ref 70–99)
Total Protein: 6.6 g/dL (ref 6.0–8.3)

## 2013-05-12 LAB — CBC WITH DIFFERENTIAL/PLATELET
Eosinophils Absolute: 0 10*3/uL (ref 0.0–0.7)
Eosinophils Relative: 0 % (ref 0–5)
HCT: 41.4 % (ref 39.0–52.0)
Hemoglobin: 14.3 g/dL (ref 13.0–17.0)
Lymphs Abs: 0.8 10*3/uL (ref 0.7–4.0)
MCH: 33.3 pg (ref 26.0–34.0)
MCV: 96.5 fL (ref 78.0–100.0)
Monocytes Absolute: 0.9 10*3/uL (ref 0.1–1.0)
Monocytes Relative: 9 % (ref 3–12)
Platelets: ADEQUATE 10*3/uL (ref 150–400)
RBC: 4.29 MIL/uL (ref 4.22–5.81)

## 2013-05-12 MED ORDER — ONDANSETRON HCL 4 MG/2ML IJ SOLN
4.0000 mg | Freq: Once | INTRAMUSCULAR | Status: AC
  Administered 2013-05-12: 4 mg via INTRAVENOUS
  Filled 2013-05-12: qty 2

## 2013-05-12 MED ORDER — HYDROMORPHONE HCL PF 1 MG/ML IJ SOLN
1.0000 mg | Freq: Once | INTRAMUSCULAR | Status: AC
  Administered 2013-05-12: 1 mg via INTRAVENOUS
  Filled 2013-05-12: qty 1

## 2013-05-12 MED ORDER — IOHEXOL 300 MG/ML  SOLN
100.0000 mL | Freq: Once | INTRAMUSCULAR | Status: AC | PRN
  Administered 2013-05-12: 100 mL via INTRAVENOUS

## 2013-05-12 MED ORDER — IOHEXOL 300 MG/ML  SOLN
50.0000 mL | Freq: Once | INTRAMUSCULAR | Status: AC | PRN
  Administered 2013-05-12: 50 mL via ORAL

## 2013-05-12 NOTE — ED Notes (Addendum)
Pt states that he had hernia repair performed at [facility] on Thursday, never passed gas prior to going home.  Pt states that he has not had a BM since the time of the surgery.  Pt remains constipated.  Surgeon advised giving an enema.  Wife did this last night and it didn't improve situation.

## 2013-05-12 NOTE — H&P (Signed)
Scott [name] is an 79 y.o. male.   Chief Complaint: abdominal pain HPI: The pt is an 79 yo wm who is 3 days s/p left inguinal hernia repair with mesh by Dr. Gerrit Friends. The family states that he had to reduce the sigmoid colon during the operation. He has had lots of problems even before surgery with constipation and has not had flatus or a bm in the last 3-4 days. He has taken a couple colace stool softeners with no result as well as half a bottle of mag citrate but he vomited this up. He was able to keep the oral contrast down for his CT at [facility]. The CT showed some pneumatosis of right colon and some inflammation near sigmoid with a dot of free air that may be from his recent surgery.  Past Medical History  Diagnosis Date  . Hypercholesterolemia   . GERD (gastroesophageal reflux disease)   . Sleep apnea     with use of CPAP  . Cranial neuropathy     resolved   . Hypertension   . BPH (benign prostatic hyperplasia)   . Thrombocytopenia     possibly due to Lovenox  . Obesity   . Anxiety     pt. reports that he has occas. nervous episodes   . H/O echocardiogram     last echo, stress test 05/2012- pt. followed by [place]   . Cancer     melanoma - removed L ear - 2010  . Anemia     Acute blood loss anemia secondary to surgery, followed fr/ Hip replacement   . CAD (coronary artery disease)     s/p CABG in 2007  CARDILOLOGIST IS DR. P. [place]  . SOB (shortness of breath)     WITH EXERTION  . Generalized OA   . Osteoarthritis     End-stage osteoarthritis, right hip    Past Surgical History  Procedure Laterality Date  . Coronary artery bypass graft  2007    LIMA GRAFT TO THE LAD, SAPHENOUS VEIN GRAFT TO THE FIRST OBTUSE MARIGNAL, SAPHENOUS VEIN GRAFT TO THE SECOND OBTUSE MARGINAL AND A SAPHENOUS VEIN GRAFT TO THE ACUTE MARGINAL AND POSTERIOR LATERAL BRANCH  . Total hip arthroplasty      right  . Appendectomy    . Carpel tunnel surgery-bilateral      both hands   .  Knee  11/12    left arthroscopic  . Cardiac catheterization  12/15/2005    NORMAL. EF 60%, angioplasty 1995  . Artery biopsy      temporal - L side- wnl, double vision treated /w prednisone - 1991  . Achilles tendon surgery  10/06/2012    Procedure: ACHILLES TENDON REPAIR;  Surgeon: Thera Flake., MD;  Location: [facility] OR;  Service: Orthopedics;  Laterality: Right;  REPAIR RIGHT RUPTURE ACHILLES TENDON PRIMARY OPEN/PERCUTANEOUS, EXCISION PARTIAL BONE TALUS/CANCANEUS, REPAIR PARTIAL EXCISION CALCANEUS  . Calcaneal osteotomy  10/06/2012    Procedure: CALCANEAL OSTEOTOMY;  Surgeon: Thera Flake., MD;  Location: [facility] OR;  Service: Orthopedics;  Laterality: Right;  . I&d extremity  12/11/2012    Procedure: IRRIGATION AND DEBRIDEMENT EXTREMITY;  Surgeon: Nadara Mustard, MD;  Location: [facility] OR;  Service: Orthopedics;  Laterality: Right;  Irrigation and Debridement achilles, wound closure, apply wound vac, place antibiotic beads  . Joint replacement    . Inguinal hernia repair Left 05/10/2013    Procedure: HERNIA REPAIR INGUINAL ADULT;  Surgeon: Velora Heckler, MD;  Location:  [facility] ORS;  Service: General;  Laterality: Left;  . Insertion of mesh Left 05/10/2013    Procedure: INSERTION OF MESH;  Surgeon: Velora Heckler, MD;  Location: [facility] ORS;  Service: General;  Laterality: Left;    Family History  Problem Relation Age of Onset  . Prostate cancer Brother   . Heart disease Brother   . Heart disease Brother    Social History:  reports that he has never smoked. He has never used smokeless tobacco. He reports that he does not drink alcohol or use illicit drugs.  Allergies:  Allergies  Allergen Reactions  . Amlodipine Swelling    Swelling of feet and legs  . Bactrim (Sulfamethoxazole W-Trimethoprim) Other (See Comments)    Constipation   . Codeine Nausea Only  . Lovenox (Enoxaparin Sodium) Other (See Comments)    MAY HAVE CAUSED THROMBOCYTOPENIA  . Prednisone Swelling    Patient has tolerated doses for  colds. Thinks it caused swelling in the past  . Shellfish Allergy Nausea And Vomiting and Other (See Comments)    N&V, shaking  . Sonata (Zaleplon) Other (See Comments)    Hyper feelings     (Not in a hospital admission)  Results for orders placed during the hospital encounter of 05/12/13 (from the past 48 hour(s))  CBC WITH DIFFERENTIAL     Status: Abnormal   Collection Time    05/12/13  1:44 PM      Result Value Range   WBC 9.5  4.0 - 10.5 K/uL   RBC 4.29  4.22 - 5.81 MIL/uL   Hemoglobin 14.3  13.0 - 17.0 g/dL   HCT 16.1  09.6 - 04.5 %   MCV 96.5  78.0 - 100.0 fL   MCH 33.3  26.0 - 34.0 pg   MCHC 34.5  30.0 - 36.0 g/dL   RDW 40.9  81.1 - 91.4 %   Platelets    150 - 400 K/uL   Value: PLATELET CLUMPS NOTED ON SMEAR, COUNT APPEARS ADEQUATE   Neutrophils Relative % 82 (*) 43 - 77 %   Neutro Abs 7.8 (*) 1.7 - 7.7 K/uL   Lymphocytes Relative 9 (*) 12 - 46 %   Lymphs Abs 0.8  0.7 - 4.0 K/uL   Monocytes Relative 9  3 - 12 %   Monocytes Absolute 0.9  0.1 - 1.0 K/uL   Eosinophils Relative 0  0 - 5 %   Eosinophils Absolute 0.0  0.0 - 0.7 K/uL   Basophils Relative 0  0 - 1 %   Basophils Absolute 0.0  0.0 - 0.1 K/uL  COMPREHENSIVE METABOLIC PANEL     Status: Abnormal   Collection Time    05/12/13  1:44 PM      Result Value Range   Sodium 136  135 - 145 mEq/L   Potassium 3.2 (*) 3.5 - 5.1 mEq/L   Chloride 96  96 - 112 mEq/L   CO2 31  19 - 32 mEq/L   Glucose, Bld 124 (*) 70 - 99 mg/dL   BUN 11  6 - 23 mg/dL   Creatinine, Ser 7.82  0.50 - 1.35 mg/dL   Calcium 9.4  8.4 - 95.6 mg/dL   Total Protein 6.6  6.0 - 8.3 g/dL   Albumin 3.5  3.5 - 5.2 g/dL   AST 18  0 - 37 U/L   ALT 13  0 - 53 U/L   Alkaline Phosphatase 65  39 - 117 U/L   Total Bilirubin 1.9 (*)  0.3 - 1.2 mg/dL   GFR calc non Af Amer 77 (*) >90 mL/min   GFR calc Af Amer 89 (*) >90 mL/min   Comment:            The eGFR has been calculated     using the CKD EPI equation.     This calculation has not been      validated in all clinical     situations.     eGFR's persistently     <90 mL/min signify     possible Chronic Kidney Disease.   Dg Abd 1 View  05/12/2013   *RADIOLOGY REPORT*  Clinical Data: Abdominal pain.  Recent hernia surgery.  ABDOMEN - 1 VIEW  Comparison: No priors.  Findings: A single view of the abdomen demonstrates a gaseous distension of the bowel, with a large dilated loop of gas-filled bowel in the central abdomen, favored to be the transverse colon. Some distal colonic and rectal gas and stool are noted.  No definite pathologic dilatation of small bowel.  No gross evidence of pneumoperitoneum on this single supine view.  Postoperative changes of right total hip arthroplasty are noted.  A small amount of gas in the soft tissues of the lower left flank.  IMPRESSION: 1.  Gaseous distension of the colon, favored to reflect a mild postoperative ileus. 2.  Small amount of gas in the soft tissues of the lower left flank, presumably postop given the recent history of hernia surgery.   Original Report Authenticated By: Trudie Reed, M.D.   Ct Abdomen Pelvis W Contrast  05/12/2013   *RADIOLOGY REPORT*  Clinical Data: Postop problem.  Entire abdominal pain.  Status post left inguinal hernia repair on 05/10/2013.  Last bowel movement on 05/09/2013.  CT ABDOMEN AND PELVIS WITH CONTRAST  Technique:  Multidetector CT imaging of the abdomen and pelvis was performed following the standard protocol during bolus administration of intravenous contrast.  Contrast: 50mL OMNIPAQUE IOHEXOL 300 MG/ML  SOLN, OMNIPAQUE IOHEXOL 300 MG/ML  SOLN  Comparison: Plain films 05/12/2013  Findings: Lung bases are clear.  Heart is enlarged.  There are coronary calcifications.  The colon is markedly abnormal.  There is significant pneumatosis involving the ascending and proximal transverse colon.  No evidence for superior mesenteric vein or portal venous gas.  At the level of the lower descending/proximal sigmoid colon,  there is a small amount of free intraperitoneal air, consistent with recent inguinal surgery. Within this region and extending into the left groin region, there are postoperative strandy changes.  No evidence for abscess in this region.  In the sigmoid colon, there is a focal area of narrowing, associated with wall thickening.  Although this may represent peristalsis, an annular lesion is not excluded.  The rectosigmoid colon is completely collapsed.  There is atherosclerotic calcification of the abdominal aorta is branches.  However, there is intravenous contrast within the visualized vessels.  No focal abnormality identified within the liver or spleen, pancreas, or adrenal glands.  Right renal cysts are identified.  No hydronephrosis.  The left kidney has a normal appearance.  The gallbladder is present.  The stomach and small bowel loops are normal in appearance.  Patient has had previous right hip arthroplasty.  There are moderate degenerative changes throughout the lower thoracic and lumbar spine.  Note is made of scoliosis, S-shaped.  IMPRESSION:  1.  Pneumatosis of the ascending and proximal transverse colon, raising question of bowel ischemia. 2.  No evidence for portal venous gas.  3.  Postoperative changes in the left lower quadrant subcutaneous tissues and small amount of extraluminal air. 4.  Focal area of wall thickening in the sigmoid colon, raising the question of annular lesion.  The findings were discussed with provider Langston Masker on 6/ 7/14 p.m.   Original Report Authenticated By: Norva Pavlov, M.D.    Review of Systems  Constitutional: Negative.   HENT: Negative.   Eyes: Negative.   Respiratory: Negative.   Cardiovascular: Negative.   Gastrointestinal: Positive for nausea, abdominal pain and constipation.  Genitourinary: Negative.   Musculoskeletal: Negative.   Skin: Negative.   Neurological: Negative.   Endo/Heme/Allergies: Negative.   Psychiatric/Behavioral: Negative.      Blood pressure 133/67, pulse 81, temperature 98.5 F (36.9 C), temperature source Oral, resp. rate 16, height 5\' 5"  (1.651 m), weight 192 lb (87.091 kg), SpO2 93.00%. Physical Exam  Constitutional: He is oriented to person, place, and time. He appears well-developed and well-nourished.  HENT:  Head: Normocephalic and atraumatic.  Eyes: Conjunctivae and EOM are normal. Pupils are equal, round, and reactive to light.  Neck: Normal range of motion. Neck supple.  Cardiovascular: Normal rate, regular rhythm and normal heart sounds.   Respiratory: Effort normal and breath sounds normal.  GI: Soft. Bowel sounds are normal.  Distended but soft. Good bowel sounds. No guarding or obvious peritonitis  Musculoskeletal: Normal range of motion.  Neurological: He is alert and oriented to person, place, and time.  Skin: Skin is warm and dry.  Psychiatric: He has a normal mood and affect. His behavior is normal.     Assessment/Plan The finding of pneumatosis is concerning and his abdomen is distended but he does not appear to have peritonitis and his other parameters for infection like wbc, temp, bp, and hr are all normal. At this point he does not seem to need urgent surgery but we do need to watch him very closely. I would favor starting broad spectrum abx and will recheck him frequently. If he worsens then he may need to go to OR. He is in agreement with this plan  TOTH III,Banner Huckaba S 05/12/2013, 11:56 PM

## 2013-05-12 NOTE — ED Provider Notes (Signed)
Patient sent to [facility] long emergency department from the [facility] ER for surgical evaluation. The patient was found to have free air in his abdomen upon workup at [facility]. Patient was evaluated. He is resting comfortably. Abdomen exam reveals diffuse distention and tenderness but tympani and absent bowel sounds. Dr. Carolynne Edouard notified that patient is now present.  Gilda Crease, MD 05/12/13 [id number]

## 2013-05-12 NOTE — ED Notes (Signed)
MD at bedside. 

## 2013-05-12 NOTE — ED Notes (Signed)
Call from [facility] who are enroute to transport

## 2013-05-12 NOTE — ED Notes (Addendum)
Patient had surgery last thursday and has not had BM nor  pass gas since surgery.Currently having abdominal pain. S/p hernia repair. Went to [facility] today - completed CT scan of abd( distended abd) painful to touch. Given iv dilaudid and zofran, decreased pain to 3/10. Saline KVO

## 2013-05-12 NOTE — ED Provider Notes (Signed)
History     CSN: [id number]  Arrival date & time 05/12/13  1126   First MD Initiated Contact with Patient 05/12/13 1216      Chief Complaint  Patient presents with  . Post-op Problem    (Consider location/radiation/quality/duration/timing/severity/associated sxs/prior treatment) Patient is a 79 y.o. male presenting with abdominal pain. The history is provided by the patient. No language interpreter was used.  Abdominal Pain This is a new problem. The current episode started today. The problem occurs constantly. The problem has been gradually worsening. Associated symptoms include abdominal pain and a change in bowel habit. Pertinent negatives include no fever, nausea, vomiting or weakness. Nothing aggravates the symptoms. Treatments tried: enema. The treatment provided moderate relief.  Pt complains of abdomen being swollen and tight.  Pt reports he has not had a bowel movement since surgery.   Pt reports he has not passed gas since surgery.   Pt denies vomiting.  No appetite.  Past Medical History  Diagnosis Date  . Hypercholesterolemia   . GERD (gastroesophageal reflux disease)   . Sleep apnea     with use of CPAP  . Cranial neuropathy     resolved   . Hypertension   . BPH (benign prostatic hyperplasia)   . Thrombocytopenia     possibly due to Lovenox  . Obesity   . Anxiety     pt. reports that he has occas. nervous episodes   . H/O echocardiogram     last echo, stress test 05/2012- pt. followed by [place]   . Cancer     melanoma - removed L ear - 2010  . Anemia     Acute blood loss anemia secondary to surgery, followed fr/ Hip replacement   . CAD (coronary artery disease)     s/p CABG in 2007  CARDILOLOGIST IS DR. P. [place]  . SOB (shortness of breath)     WITH EXERTION  . Generalized OA   . Osteoarthritis     End-stage osteoarthritis, right hip    Past Surgical History  Procedure Laterality Date  . Coronary artery bypass graft  2007    LIMA GRAFT TO THE LAD,  SAPHENOUS VEIN GRAFT TO THE FIRST OBTUSE MARIGNAL, SAPHENOUS VEIN GRAFT TO THE SECOND OBTUSE MARGINAL AND A SAPHENOUS VEIN GRAFT TO THE ACUTE MARGINAL AND POSTERIOR LATERAL BRANCH  . Total hip arthroplasty      right  . Appendectomy    . Carpel tunnel surgery-bilateral      both hands   . Knee  11/12    left arthroscopic  . Cardiac catheterization  12/15/2005    NORMAL. EF 60%, angioplasty 1995  . Artery biopsy      temporal - L side- wnl, double vision treated /w prednisone - 1991  . Achilles tendon surgery  10/06/2012    Procedure: ACHILLES TENDON REPAIR;  Surgeon: Thera Flake., MD;  Location: [facility] OR;  Service: Orthopedics;  Laterality: Right;  REPAIR RIGHT RUPTURE ACHILLES TENDON PRIMARY OPEN/PERCUTANEOUS, EXCISION PARTIAL BONE TALUS/CANCANEUS, REPAIR PARTIAL EXCISION CALCANEUS  . Calcaneal osteotomy  10/06/2012    Procedure: CALCANEAL OSTEOTOMY;  Surgeon: Thera Flake., MD;  Location: [facility] OR;  Service: Orthopedics;  Laterality: Right;  . I&d extremity  12/11/2012    Procedure: IRRIGATION AND DEBRIDEMENT EXTREMITY;  Surgeon: Nadara Mustard, MD;  Location: [facility] OR;  Service: Orthopedics;  Laterality: Right;  Irrigation and Debridement achilles, wound closure, apply wound vac, place antibiotic beads  . Joint replacement    .  Inguinal hernia repair Left 05/10/2013    Procedure: HERNIA REPAIR INGUINAL ADULT;  Surgeon: Velora Heckler, MD;  Location: [facility] ORS;  Service: General;  Laterality: Left;  . Insertion of mesh Left 05/10/2013    Procedure: INSERTION OF MESH;  Surgeon: Velora Heckler, MD;  Location: [facility] ORS;  Service: General;  Laterality: Left;    Family History  Problem Relation Age of Onset  . Prostate cancer Brother   . Heart disease Brother   . Heart disease Brother     History  Substance Use Topics  . Smoking status: Never Smoker   . Smokeless tobacco: Never Used  . Alcohol Use: No      Review of Systems  Constitutional: Negative for fever.  Gastrointestinal: Positive for  abdominal pain, abdominal distention and change in bowel habit. Negative for nausea and vomiting.  Neurological: Negative for weakness.  All other systems reviewed and are negative.    Allergies  Amlodipine; Bactrim; Codeine; Lovenox; Prednisone; Shellfish allergy; and Sonata  Home Medications   Current Outpatient Rx  Name  Route  Sig  Dispense  Refill  . aspirin 81 MG tablet   Oral   Take 81 mg by mouth daily.          . calcium carbonate (OS-CAL) 600 MG TABS   Oral   Take 600 mg by mouth daily.         . Calcium Citrate (CITRACAL PO)   Oral   Take 1 tablet by mouth daily.         Marland Kitchen doxazosin (CARDURA) 4 MG tablet   Oral   Take 4 mg by mouth at bedtime.          . finasteride (PROSCAR) 5 MG tablet   Oral   Take 5 mg by mouth daily before breakfast.          . fish oil-omega-3 fatty acids 1000 MG capsule   Oral   Take 1 g by mouth every other day.          Marland Kitchen HYDROcodone-acetaminophen (NORCO/VICODIN) 5-325 MG per tablet   Oral   Take 1-2 tablets by mouth every 4 (four) hours as needed for pain.   20 tablet   1   . lovastatin (MEVACOR) 20 MG tablet   Oral   Take 20 mg by mouth at bedtime.          . metoprolol (LOPRESSOR) 50 MG tablet   Oral   Take 25 mg by mouth 2 (two) times daily.         . Multiple Vitamins-Minerals (EYE VITAMINS PO)   Oral   Take 1 tablet by mouth daily.         . potassium chloride (K-DUR,KLOR-CON) 10 MEQ tablet   Oral   Take 10 mEq by mouth daily.         . ranitidine (ZANTAC) 75 MG tablet   Oral   Take 75 mg by mouth daily before breakfast.         . Tamsulosin HCl (FLOMAX) 0.4 MG CAPS   Oral   Take 0.4 mg by mouth daily after supper.         . temazepam (RESTORIL) 30 MG capsule   Oral   Take 30 mg by mouth at bedtime as needed for sleep.          Marland Kitchen tetrahydrozoline 0.05 % ophthalmic solution   Both Eyes   Place 1 drop into both eyes daily as needed (for  dry eyes).         .  triamterene-hydrochlorothiazide (MAXZIDE) 75-50 MG per tablet   Oral   Take 1 tablet by mouth daily before breakfast.          . triazolam (HALCION) 0.25 MG tablet   Oral   Take 1 tablet (0.25 mg total) by mouth at bedtime as needed (If needed for sleep).   30 tablet   5     BP 150/78  Pulse 66  Temp(Src) 98.6 F (37 C) (Oral)  Resp 16  Ht 5\' 5"  (1.651 m)  Wt 192 lb (87.091 kg)  BMI 31.95 kg/m2  SpO2 95%  Physical Exam  Nursing note and vitals reviewed. Constitutional: He appears well-developed and well-nourished.  HENT:  Head: Normocephalic.  Right Ear: External ear normal.  Left Ear: External ear normal.  Mouth/Throat: Oropharynx is clear and moist.  Eyes: Pupils are equal, round, and reactive to light.  Neck: Normal range of motion.  Cardiovascular: Normal rate, regular rhythm and normal heart sounds.   Pulmonary/Chest: Effort normal and breath sounds normal.  Abdominal: Soft. He exhibits distension. There is tenderness. There is guarding.  Musculoskeletal: Normal range of motion.  Neurological: He is alert.  Skin: Skin is warm.  Psychiatric: He has a normal mood and affect.    ED Course  Procedures (including critical care time)  Labs Reviewed - No data to display No results found.   No diagnosis found.    MDM   Results for orders placed during the hospital encounter of 05/12/13  CBC WITH DIFFERENTIAL      Result Value Range   WBC 9.5  4.0 - 10.5 K/uL   RBC 4.29  4.22 - 5.81 MIL/uL   Hemoglobin 14.3  13.0 - 17.0 g/dL   HCT 40.9  81.1 - 91.4 %   MCV 96.5  78.0 - 100.0 fL   MCH 33.3  26.0 - 34.0 pg   MCHC 34.5  30.0 - 36.0 g/dL   RDW 78.2  95.6 - 21.3 %   Platelets    150 - 400 K/uL   Value: PLATELET CLUMPS NOTED ON SMEAR, COUNT APPEARS ADEQUATE   Neutrophils Relative % 82 (*) 43 - 77 %   Neutro Abs 7.8 (*) 1.7 - 7.7 K/uL   Lymphocytes Relative 9 (*) 12 - 46 %   Lymphs Abs 0.8  0.7 - 4.0 K/uL   Monocytes Relative 9  3 - 12 %   Monocytes  Absolute 0.9  0.1 - 1.0 K/uL   Eosinophils Relative 0  0 - 5 %   Eosinophils Absolute 0.0  0.0 - 0.7 K/uL   Basophils Relative 0  0 - 1 %   Basophils Absolute 0.0  0.0 - 0.1 K/uL  COMPREHENSIVE METABOLIC PANEL      Result Value Range   Sodium 136  135 - 145 mEq/L   Potassium 3.2 (*) 3.5 - 5.1 mEq/L   Chloride 96  96 - 112 mEq/L   CO2 31  19 - 32 mEq/L   Glucose, Bld 124 (*) 70 - 99 mg/dL   BUN 11  6 - 23 mg/dL   Creatinine, Ser 0.86  0.50 - 1.35 mg/dL   Calcium 9.4  8.4 - 57.8 mg/dL   Total Protein 6.6  6.0 - 8.3 g/dL   Albumin 3.5  3.5 - 5.2 g/dL   AST 18  0 - 37 U/L   ALT 13  0 - 53 U/L   Alkaline Phosphatase  65  39 - 117 U/L   Total Bilirubin 1.9 (*) 0.3 - 1.2 mg/dL   GFR calc non Af Amer 77 (*) >90 mL/min   GFR calc Af Amer 89 (*) >90 mL/min   Dg Abd 1 View  05/12/2013   *RADIOLOGY REPORT*  Clinical Data: Abdominal pain.  Recent hernia surgery.  ABDOMEN - 1 VIEW  Comparison: No priors.  Findings: A single view of the abdomen demonstrates a gaseous distension of the bowel, with a large dilated loop of gas-filled bowel in the central abdomen, favored to be the transverse colon. Some distal colonic and rectal gas and stool are noted.  No definite pathologic dilatation of small bowel.  No gross evidence of pneumoperitoneum on this single supine view.  Postoperative changes of right total hip arthroplasty are noted.  A small amount of gas in the soft tissues of the lower left flank.  IMPRESSION: 1.  Gaseous distension of the colon, favored to reflect a mild postoperative ileus. 2.  Small amount of gas in the soft tissues of the lower left flank, presumably postop given the recent history of hernia surgery.   Original Report Authenticated By: Trudie Reed, M.D.   Ct Abdomen Pelvis W Contrast  05/12/2013   *RADIOLOGY REPORT*  Clinical Data: Postop problem.  Entire abdominal pain.  Status post left inguinal hernia repair on 05/10/2013.  Last bowel movement on 05/09/2013.  CT ABDOMEN AND  PELVIS WITH CONTRAST  Technique:  Multidetector CT imaging of the abdomen and pelvis was performed following the standard protocol during bolus administration of intravenous contrast.  Contrast: 50mL OMNIPAQUE IOHEXOL 300 MG/ML  SOLN, OMNIPAQUE IOHEXOL 300 MG/ML  SOLN  Comparison: Plain films 05/12/2013  Findings: Lung bases are clear.  Heart is enlarged.  There are coronary calcifications.  The colon is markedly abnormal.  There is significant pneumatosis involving the ascending and proximal transverse colon.  No evidence for superior mesenteric vein or portal venous gas.  At the level of the lower descending/proximal sigmoid colon, there is a small amount of free intraperitoneal air, consistent with recent inguinal surgery. Within this region and extending into the left groin region, there are postoperative strandy changes.  No evidence for abscess in this region.  In the sigmoid colon, there is a focal area of narrowing, associated with wall thickening.  Although this may represent peristalsis, an annular lesion is not excluded.  The rectosigmoid colon is completely collapsed.  There is atherosclerotic calcification of the abdominal aorta is branches.  However, there is intravenous contrast within the visualized vessels.  No focal abnormality identified within the liver or spleen, pancreas, or adrenal glands.  Right renal cysts are identified.  No hydronephrosis.  The left kidney has a normal appearance.  The gallbladder is present.  The stomach and small bowel loops are normal in appearance.  Patient has had previous right hip arthroplasty.  There are moderate degenerative changes throughout the lower thoracic and lumbar spine.  Note is made of scoliosis, S-shaped.  IMPRESSION:  1.  Pneumatosis of the ascending and proximal transverse colon, raising question of bowel ischemia. 2.  No evidence for portal venous gas. 3.  Postoperative changes in the left lower quadrant subcutaneous tissues and small amount  of extraluminal air. 4.  Focal area of wall thickening in the sigmoid colon, raising the question of annular lesion.  The findings were discussed with provider Langston Masker on 6/ 7/14 p.m.   Original Report Authenticated By: Norva Pavlov, M.D.  Elson Areas, PA-C 05/12/13 1921  Lonia Skinner [place], PA-C 05/12/13 1921

## 2013-05-13 DIAGNOSIS — K5900 Constipation, unspecified: Secondary | ICD-10-CM

## 2013-05-13 DIAGNOSIS — K668 Other specified disorders of peritoneum: Secondary | ICD-10-CM

## 2013-05-13 LAB — BASIC METABOLIC PANEL
CO2: 33 mEq/L — ABNORMAL HIGH (ref 19–32)
Chloride: 98 mEq/L (ref 96–112)
Creatinine, Ser: 0.86 mg/dL (ref 0.50–1.35)
GFR calc Af Amer: >90 mL/min (ref >90)
Potassium: 3.4 mEq/L — ABNORMAL LOW (ref 3.5–5.1)
Sodium: 138 mEq/L (ref 135–145)

## 2013-05-13 LAB — CBC
HCT: 39.5 % (ref 39.0–52.0)
MCV: 95.6 fL (ref 78.0–100.0)
RBC: 4.13 MIL/uL — ABNORMAL LOW (ref 4.22–5.81)
RDW: 13.1 % (ref 11.5–15.5)
WBC: 8.9 10*3/uL (ref 4.0–10.5)

## 2013-05-13 MED ORDER — TRIAZOLAM 0.25 MG PO TABS
0.2500 mg | ORAL_TABLET | Freq: Every evening | ORAL | Status: DC | PRN
  Administered 2013-05-13: 0.25 mg via ORAL
  Filled 2013-05-13: qty 1

## 2013-05-13 MED ORDER — DOCUSATE SODIUM 100 MG PO CAPS
200.0000 mg | ORAL_CAPSULE | Freq: Every evening | ORAL | Status: DC | PRN
  Filled 2013-05-13: qty 2

## 2013-05-13 MED ORDER — POLYETHYLENE GLYCOL 3350 17 G PO PACK
17.0000 g | PACK | Freq: Every day | ORAL | Status: DC | PRN
  Administered 2013-05-13: 17 g via ORAL
  Filled 2013-05-13: qty 1

## 2013-05-13 MED ORDER — TAMSULOSIN HCL 0.4 MG PO CAPS: 0.4000 mg | ORAL_CAPSULE | Freq: Every day | ORAL | Status: DC

## 2013-05-13 MED ORDER — MORPHINE SULFATE 2 MG/ML IJ SOLN: 2.0000 mg | INTRAMUSCULAR | Status: DC | PRN

## 2013-05-13 MED ORDER — METOPROLOL TARTRATE 25 MG PO TABS
25.0000 mg | ORAL_TABLET | Freq: Two times a day (BID) | ORAL | Status: DC
  Administered 2013-05-13 (×2): 25 mg via ORAL
  Filled 2013-05-13 (×4): qty 1

## 2013-05-13 MED ORDER — ONDANSETRON HCL 4 MG/2ML IJ SOLN: 4.0000 mg | Freq: Four times a day (QID) | INTRAMUSCULAR | Status: DC | PRN

## 2013-05-13 MED ORDER — TAMSULOSIN HCL 0.4 MG PO CAPS
0.4000 mg | ORAL_CAPSULE | Freq: Every day | ORAL | Status: DC
  Administered 2013-05-13: 0.4 mg via ORAL
  Filled 2013-05-13 (×2): qty 1

## 2013-05-13 MED ORDER — PIPERACILLIN-TAZOBACTAM 3.375 G IVPB
3.3750 g | Freq: Three times a day (TID) | INTRAVENOUS | Status: DC
  Administered 2013-05-13 – 2013-05-14 (×3): 3.375 g via INTRAVENOUS
  Filled 2013-05-13 (×4): qty 50

## 2013-05-13 MED ORDER — NAPHAZOLINE HCL 0.1 % OP SOLN
1.0000 [drp] | Freq: Four times a day (QID) | OPHTHALMIC | Status: DC | PRN
  Filled 2013-05-13: qty 15

## 2013-05-13 MED ORDER — KCL IN DEXTROSE-NACL 20-5-0.9 MEQ/L-%-% IV SOLN
INTRAVENOUS | Status: DC
  Administered 2013-05-13: 20:00:00 via INTRAVENOUS
  Administered 2013-05-13: 1000 mL via INTRAVENOUS
  Administered 2013-05-13 – 2013-05-14 (×2): via INTRAVENOUS
  Filled 2013-05-13 (×4): qty 1000

## 2013-05-13 MED ORDER — TRIAMTERENE-HCTZ 75-50 MG PO TABS
1.0000 | ORAL_TABLET | Freq: Every day | ORAL | Status: DC
  Administered 2013-05-14: 1 via ORAL
  Filled 2013-05-13 (×2): qty 1

## 2013-05-13 MED ORDER — RANITIDINE HCL 15 MG/ML PO SYRP
75.0000 mg | ORAL_SOLUTION | Freq: Once | ORAL | Status: AC
  Administered 2013-05-13: 75 mg via ORAL
  Filled 2013-05-13: qty 5

## 2013-05-13 MED ORDER — PANTOPRAZOLE SODIUM 40 MG IV SOLR
40.0000 mg | Freq: Every day | INTRAVENOUS | Status: DC
  Administered 2013-05-13: 40 mg via INTRAVENOUS
  Filled 2013-05-13 (×2): qty 40

## 2013-05-13 NOTE — ED Provider Notes (Signed)
Medical screening examination/treatment/procedure(s) were conducted as a shared visit with non-physician practitioner(s) and myself.  I personally evaluated the patient during the encounter  Patient seen by me. The initial plain films of the abdomen highly suggestive of either an ileus or bowel obstruction. CT scan was ordered but had not come back during that time that the eye was in ED. The plan for the patient most likely was going to require admission 1 specifically what was: Normal the abdomen was ruled out. Plain films also raise some concern possibly for volvulus. Patient status post inguinal hernia repair on June 5 by Dr. Julien Girt. Had mesh put in place. Since that time patient has not had a bowel movement.  Shelda Jakes, MD 05/13/13 [phone]

## 2013-05-13 NOTE — Progress Notes (Signed)
ANTIBIOTIC CONSULT NOTE - INITIAL  Pharmacy Consult for Zosyn Indication: Infection of the colon  Allergies  Allergen Reactions  . Amlodipine Swelling    Swelling of feet and legs  . Bactrim (Sulfamethoxazole W-Trimethoprim) Other (See Comments)    Constipation   . Codeine Nausea Only  . Lovenox (Enoxaparin Sodium) Other (See Comments)    MAY HAVE CAUSED THROMBOCYTOPENIA  . Prednisone Swelling    Patient has tolerated doses for colds. Thinks it caused swelling in the past  . Shellfish Allergy Nausea And Vomiting and Other (See Comments)    N&V, shaking  . Sonata ([place]) Other (See Comments)    Hyper feelings    Patient Measurements: Height: 5\' 5"  (165.1 cm) Weight: 192 lb (87.091 kg) IBW/kg (Calculated) : 61.5   Vital Signs: Temp: 98.5 F (36.9 C) (06/08 1036) Temp src: Oral (06/08 1036) BP: 152/65 mmHg (06/08 1129) Pulse Rate: 76 (06/08 1129) Intake/Output from previous day: 06/07 0701 - 06/08 0700 In: 1300 [I.V.:1300] Out: 650 [Urine:650]   Labs:  Recent Labs  05/12/13 1344 05/13/13 0545  WBC 9.5 8.9  HGB 14.3 13.5  PLT PLATELET CLUMPS NOTED ON SMEAR, COUNT APPEARS ADEQUATE PLATELETS APPEAR ADEQUATE  CREATININE 0.90 0.86   Estimated Creatinine Clearance: 67.2 ml/min (by C-G formula based on Cr of 0.86). No results found for this basename: VANCOTROUGH, Leodis Binet, VANCORANDOM, GENTTROUGH, GENTPEAK, GENTRANDOM, TOBRATROUGH, TOBRAPEAK, TOBRARND, AMIKACINPEAK, AMIKACINTROU, AMIKACIN,  in the last 72 hours   Microbiology: Recent Results (from the past 720 hour(s))  SURGICAL PCR SCREEN     Status: None   Collection Time    05/03/13  9:06 AM      Result Value Range Status   MRSA, PCR NEGATIVE  NEGATIVE Final   Staphylococcus aureus NEGATIVE  NEGATIVE Final   Comment:            The Xpert SA Assay (FDA     approved for NASAL specimens     in patients over 83 years of age),     is one component of     a comprehensive surveillance     program.  Test  performance has     been validated by [employer] for patients greater     than or equal to 25 year old.     It is not intended     to diagnose infection nor to     guide or monitor treatment.    Medical History: Past Medical History  Diagnosis Date  . Hypercholesterolemia   . GERD (gastroesophageal reflux disease)   . Sleep apnea     with use of CPAP  . Cranial neuropathy     resolved   . Hypertension   . BPH (benign prostatic hyperplasia)   . Thrombocytopenia     possibly due to Lovenox  . Obesity   . Anxiety     pt. reports that he has occas. nervous episodes   . H/O echocardiogram     last echo, stress test 05/2012- pt. followed by [place]   . Cancer     melanoma - removed L ear - 2010  . Anemia     Acute blood loss anemia secondary to surgery, followed fr/ Hip replacement   . CAD (coronary artery disease)     s/p CABG in 2007  CARDILOLOGIST IS DR. P. [place]  . SOB (shortness of breath)     WITH EXERTION  . Generalized OA   . Osteoarthritis  End-stage osteoarthritis, right hip    Medications:  Scheduled:  . metoprolol tartrate  25 mg Oral BID  . pantoprazole (PROTONIX) IV  40 mg Intravenous QHS  . ranitidine (ZANTAC) syrup 15 mg/mL  75 mg Oral Once  . tamsulosin  0.4 mg Oral QPC supper  . [START ON 05/14/2013] triamterene-hydrochlorothiazide  1 tablet Oral QAC breakfast   Infusions:  . dextrose 5 % and 0.9 % NaCl with KCl 20 mEq/L 1,000 mL (05/13/13 0602)   PRN: docusate sodium, morphine injection, naphazoline, ondansetron, polyethylene glycol Assessment: . 79 yo male who is 3 days s/p left inguinal hernia repair with mesh. CT showing some pneumatosis of right colon and some inflammation near sigmoid and Zosyn is to be started for broad coverage.  Plan:  . Will give Zosyn 3.375 gm IV Q8h over 4 hour infusion . Will f/u Scr and adjust dose as needed  Dorethea Clan 05/13/2013,1:08 PM

## 2013-05-13 NOTE — ED Notes (Addendum)
MD Biagio Quint made aware that there are no stepdown beds available. AC contacted.Pateitn stated that his belly has gone down quite a bit. Continuing to pass gas.

## 2013-05-13 NOTE — Progress Notes (Signed)
Subjective: Feels better this am. Passed a lot of flatus overnight  Objective: Vital signs in last 24 hours: Temp:  [98.3 F (36.8 C)-98.6 F (37 C)] 98.5 F (36.9 C) (06/07 2258) Pulse Rate:  [66-84] 84 (06/08 0306) Resp:  [16-18] 18 (06/08 0306) BP: (133-152)/(63-84) 152/63 mmHg (06/08 0306) SpO2:  [93 %-99 %] 94 % (06/08 0306) Weight:  [192 lb (87.091 kg)] 192 lb (87.091 kg) (06/07 1137)    Intake/Output from previous day: 06/07 0701 - 06/08 0700 In: 1300 [I.V.:1300] Out: 650 [Urine:650] Intake/Output this shift: Total I/O In: 1300 [I.V.:1300] Out: 650 [Urine:650]  GI: soft, less distended. good bs. less tender. no peritonitis  Lab Results:   Recent Labs  05/12/13 1344  WBC 9.5  HGB 14.3  HCT 41.4  PLT PLATELET CLUMPS NOTED ON SMEAR, COUNT APPEARS ADEQUATE   BMET  Recent Labs  05/12/13 1344  NA 136  K 3.2*  CL 96  CO2 31  GLUCOSE 124*  BUN 11  CREATININE 0.90  CALCIUM 9.4   PT/INR No results found for this basename: LABPROT, INR,  in the last 72 hours ABG No results found for this basename: PHART, PCO2, PO2, HCO3,  in the last 72 hours  Studies/Results: Dg Abd 1 View  05/12/2013   *RADIOLOGY REPORT*  Clinical Data: Abdominal pain.  Recent hernia surgery.  ABDOMEN - 1 VIEW  Comparison: No priors.  Findings: A single view of the abdomen demonstrates a gaseous distension of the bowel, with a large dilated loop of gas-filled bowel in the central abdomen, favored to be the transverse colon. Some distal colonic and rectal gas and stool are noted.  No definite pathologic dilatation of small bowel.  No gross evidence of pneumoperitoneum on this single supine view.  Postoperative changes of right total hip arthroplasty are noted.  A small amount of gas in the soft tissues of the lower left flank.  IMPRESSION: 1.  Gaseous distension of the colon, favored to reflect a mild postoperative ileus. 2.  Small amount of gas in the soft tissues of the lower left flank,  presumably postop given the recent history of hernia surgery.   Original Report Authenticated By: Trudie Reed, M.D.   Ct Abdomen Pelvis W Contrast  05/12/2013   *RADIOLOGY REPORT*  Clinical Data: Postop problem.  Entire abdominal pain.  Status post left inguinal hernia repair on 05/10/2013.  Last bowel movement on 05/09/2013.  CT ABDOMEN AND PELVIS WITH CONTRAST  Technique:  Multidetector CT imaging of the abdomen and pelvis was performed following the standard protocol during bolus administration of intravenous contrast.  Contrast: 50mL OMNIPAQUE IOHEXOL 300 MG/ML  SOLN, OMNIPAQUE IOHEXOL 300 MG/ML  SOLN  Comparison: Plain films 05/12/2013  Findings: Lung bases are clear.  Heart is enlarged.  There are coronary calcifications.  The colon is markedly abnormal.  There is significant pneumatosis involving the ascending and proximal transverse colon.  No evidence for superior mesenteric vein or portal venous gas.  At the level of the lower descending/proximal sigmoid colon, there is a small amount of free intraperitoneal air, consistent with recent inguinal surgery. Within this region and extending into the left groin region, there are postoperative strandy changes.  No evidence for abscess in this region.  In the sigmoid colon, there is a focal area of narrowing, associated with wall thickening.  Although this may represent peristalsis, an annular lesion is not excluded.  The rectosigmoid colon is completely collapsed.  There is atherosclerotic calcification of the abdominal aorta  is branches.  However, there is intravenous contrast within the visualized vessels.  No focal abnormality identified within the liver or spleen, pancreas, or adrenal glands.  Right renal cysts are identified.  No hydronephrosis.  The left kidney has a normal appearance.  The gallbladder is present.  The stomach and small bowel loops are normal in appearance.  Patient has had previous right hip arthroplasty.  There are moderate  degenerative changes throughout the lower thoracic and lumbar spine.  Note is made of scoliosis, [name]-shaped.  IMPRESSION:  1.  Pneumatosis of the ascending and proximal transverse colon, raising question of bowel ischemia. 2.  No evidence for portal venous gas. 3.  Postoperative changes in the left lower quadrant subcutaneous tissues and small amount of extraluminal air. 4.  Focal area of wall thickening in the sigmoid colon, raising the question of annular lesion.  The findings were discussed with provider Scott [name] on 6/ 7/14 p.m.   Original Report Authenticated By: Norva Pavlov, M.D.    Anti-infectives: Anti-infectives   None      Assessment/Plan: [name]/p * No surgery found * Continue abx and bowel rest Check wbc today Allow sips of golytely for constipation  LOS: 1 day    TOTH III,Scott [name] 05/13/2013

## 2013-05-13 NOTE — ED Notes (Signed)
Incision to lower left abdomen examined. No redness or other signs and symptoms of infection.

## 2013-05-13 NOTE — ED Notes (Signed)
Patient states he is continuing to pass gas

## 2013-05-14 NOTE — Discharge Summary (Signed)
Physician Discharge Summary  Patient ID:  Scott [name]  MRN: [id number]  DOB/AGE: [birth year] 79 y.o.  Admit date: 05/12/2013 Discharge date: 05/14/2013  Discharge Diagnoses:   1.  Pneumatosis of right colon - secondary to ileus  2.  Recent left inguinal hernia repair - T. Gerkin - 05/10/2013  3.  GERD  4.  Sleep apnea on CPAP  5.  HTN  6.  Obesity  7.  Hx of CAD - CABG - 2007  Operation:  None  Discharged Condition: good  Hospital Course: Theon Diop is an 79 y.o. male whose primary care physician is Lillia Mountain, MD and who was admitted 05/12/2013 with a chief complaint of abdominal distention and pain.  CT scan on 05/12/2013 showed right colon pneumatosis and even questioned lesion at sigmoid colon.  The patient was placed on bowel rest, given IVF,and has improved rapidly.  He started passing flatus, is having bowel movements, and is doing much better.  He is still on liquids, but I would stay on these for 2 or 3 more days.  He's not had a colonoscopy in some time and I gave the patient a copy of the CT scan and raised the questions whether he should get a colonoscopy when his current symptoms resolve.  His wife is at the bedside and he is ready to go home.  The discharge instructions were reviewed with the patient.  Consults: None  Significant Diagnostic Studies: Results for orders placed during the hospital encounter of 05/12/13  CBC WITH DIFFERENTIAL      Result Value Range   WBC 9.5  4.0 - 10.5 K/uL   RBC 4.29  4.22 - 5.81 MIL/uL   Hemoglobin 14.3  13.0 - 17.0 g/dL   HCT 08.6  57.8 - 46.9 %   MCV 96.5  78.0 - 100.0 fL   MCH 33.3  26.0 - 34.0 pg   MCHC 34.5  30.0 - 36.0 g/dL   RDW 62.9  52.8 - 41.3 %   Platelets    150 - 400 K/uL   Value: PLATELET CLUMPS NOTED ON SMEAR, COUNT APPEARS ADEQUATE   Neutrophils Relative % 82 (*) 43 - 77 %   Neutro Abs 7.8 (*) 1.7 - 7.7 K/uL   Lymphocytes Relative 9 (*) 12 - 46 %   Lymphs Abs 0.8  0.7 - 4.0 K/uL   Monocytes  Relative 9  3 - 12 %   Monocytes Absolute 0.9  0.1 - 1.0 K/uL   Eosinophils Relative 0  0 - 5 %   Eosinophils Absolute 0.0  0.0 - 0.7 K/uL   Basophils Relative 0  0 - 1 %   Basophils Absolute 0.0  0.0 - 0.1 K/uL  COMPREHENSIVE METABOLIC PANEL      Result Value Range   Sodium 136  135 - 145 mEq/L   Potassium 3.2 (*) 3.5 - 5.1 mEq/L   Chloride 96  96 - 112 mEq/L   CO2 31  19 - 32 mEq/L   Glucose, Bld 124 (*) 70 - 99 mg/dL   BUN 11  6 - 23 mg/dL   Creatinine, Ser 2.44  0.50 - 1.35 mg/dL   Calcium 9.4  8.4 - 01.0 mg/dL   Total Protein 6.6  6.0 - 8.3 g/dL   Albumin 3.5  3.5 - 5.2 g/dL   AST 18  0 - 37 U/L   ALT 13  0 - 53 U/L   Alkaline Phosphatase 65  39 - 117 U/L   Total  Bilirubin 1.9 (*) 0.3 - 1.2 mg/dL   GFR calc non Af Amer 77 (*) >90 mL/min   GFR calc Af Amer 89 (*) >90 mL/min  BASIC METABOLIC PANEL      Result Value Range   Sodium 138  135 - 145 mEq/L   Potassium 3.4 (*) 3.5 - 5.1 mEq/L   Chloride 98  96 - 112 mEq/L   CO2 33 (*) 19 - 32 mEq/L   Glucose, Bld 97  70 - 99 mg/dL   BUN 9  6 - 23 mg/dL   Creatinine, Ser 1.32  0.50 - 1.35 mg/dL   Calcium 8.9  8.4 - 44.0 mg/dL   GFR calc non Af Amer 79 (*) >90 mL/min   GFR calc Af Amer >90  >90 mL/min  CBC      Result Value Range   WBC 8.9  4.0 - 10.5 K/uL   RBC 4.13 (*) 4.22 - 5.81 MIL/uL   Hemoglobin 13.5  13.0 - 17.0 g/dL   HCT 10.2  72.5 - 36.6 %   MCV 95.6  78.0 - 100.0 fL   MCH 32.7  26.0 - 34.0 pg   MCHC 34.2  30.0 - 36.0 g/dL   RDW 44.0  34.7 - 42.5 %   Platelets PLATELETS APPEAR ADEQUATE  150 - 400 K/uL    Dg Abd 1 View  05/12/2013   *RADIOLOGY REPORT*  Clinical Data: Abdominal pain.  Recent hernia surgery.  ABDOMEN - 1 VIEW  Comparison: No priors.   MPRESSION: 1.  Gaseous distension of the colon, favored to reflect a mild postoperative ileus. 2.  Small amount of gas in the soft tissues of the lower left flank, presumably postop given the recent history of hernia surgery.   Original Report Authenticated By: Trudie Reed, M.D.   Ct Abdomen Pelvis W Contrast  05/12/2013   *RADIOLOGY REPORT*  Clinical Data: Postop problem.  Entire abdominal pain.  Status post left inguinal hernia repair on 05/10/2013.  Last bowel movement on 05/09/2013.  CT ABDOMEN AND PELVIS WITH CONTRAST  Technique:  Multidetector CT imaging of the abdomen and pelvis was performed following the standard protocol during bolus administration of intravenous contrast.  Contrast: 50mL OMNIPAQUE IOHEXOL 300 MG/ML  SOLN, OMNIPAQUE IOHEXOL 300 MG/ML  SOLN  Comparison: Plain films 05/12/2013      IMPRESSION:  1.  Pneumatosis of the ascending and proximal transverse colon, raising question of bowel ischemia. 2.  No evidence for portal venous gas. 3.  Postoperative changes in the left lower quadrant subcutaneous tissues and small amount of extraluminal air. 4.  Focal area of wall thickening in the sigmoid colon, raising the question of annular lesion.  The findings were discussed with provider Langston Masker on 6/ 7/14 p.m.   Original Report Authenticated By: Norva Pavlov, M.D.    Discharge Exam:  Filed Vitals:   05/14/13 0755  BP: 134/74  Pulse: 62  Temp:   Resp: 19    General: WN older WM who is alert.  Lungs: Clear to auscultation and symmetric breath sounds. Heart:  RRR. No murmur or rub. Abdomen: No mass. No tenderness. Mild distention. Normal bowel sounds. Dressing over left inguinal incision.  Discharge Medications:     Medication List    ASK your doctor about these medications       aspirin 81 MG tablet  Take 81 mg by mouth every evening.     beta carotene w/minerals tablet  Take 1 tablet by mouth every  morning.     calcium citrate 950 MG tablet  Commonly known as:  CALCITRATE - dosed in mg elemental calcium  Take 1 tablet by mouth every morning.     docusate sodium 100 MG capsule  Commonly known as:  COLACE  Take 200 mg by mouth at bedtime as needed for constipation.     finasteride 5 MG tablet  Commonly known  as:  PROSCAR  Take 5 mg by mouth daily before breakfast.     fish oil-omega-3 fatty acids 1000 MG capsule  Take 1 g by mouth every other day.     HYDROcodone-acetaminophen 5-325 MG per tablet  Commonly known as:  NORCO/VICODIN  Take 1-2 tablets by mouth every 4 (four) hours as needed for pain.     lovastatin 20 MG tablet  Commonly known as:  MEVACOR  Take 20 mg by mouth at bedtime.     metoprolol 50 MG tablet  Commonly known as:  LOPRESSOR  Take 25 mg by mouth 2 (two) times daily.     potassium chloride 10 MEQ tablet  Commonly known as:  K-DUR,KLOR-CON  Take 10 mEq by mouth every evening.     ranitidine 75 MG tablet  Commonly known as:  ZANTAC  Take 75 mg by mouth daily before breakfast.     tamsulosin 0.4 MG Caps  Commonly known as:  FLOMAX  Take 0.4 mg by mouth daily after supper.     temazepam 30 MG capsule  Commonly known as:  RESTORIL  Take 30 mg by mouth at bedtime as needed for sleep.     tetrahydrozoline 0.05 % ophthalmic solution  Place 1 drop into both eyes daily as needed (for dry eyes).     triamterene-hydrochlorothiazide 75-50 MG per tablet  Commonly known as:  MAXZIDE  Take 1 tablet by mouth daily before breakfast.     triazolam 0.25 MG tablet  Commonly known as:  HALCION  Take 1 tablet (0.25 mg total) by mouth at bedtime as needed (If needed for sleep).        Disposition: 01-Home or Self Care       Future Appointments Provider Department Dept Phone   06/05/2013 3:50 PM Velora Heckler, MD [facility] [place] [phone]   12/07/2013 9:00 AM Waymon Budge, MD [facility] [phone]     Activity:  Driving - May drive in 2 or 3 days if doing well   Lifting - No lifting > 15 pounds for 3 weeks  Wound Care:   May shower  Diet:  Liquids for 2 to 3 days, then advance diet as tolerated  Follow up appointment:  Call Dr. Ardine Eng office [facility]) at [phone] for an appointment in 7 to 14 days.  Medications and  dosages:  Resume your home medications.  Signed: Ovidio Kin, M.D., FACS  05/14/2013, 8:07 AM

## 2013-05-15 ENCOUNTER — Telehealth (INDEPENDENT_AMBULATORY_CARE_PROVIDER_SITE_OTHER): Payer: Self-pay | Admitting: *Deleted

## 2013-05-15 NOTE — Telephone Encounter (Signed)
Wife called regarding PO appt.  Wife wanted Dr. Gerrit Friends to know that patient has been back in the hospital.  Wife was informed of PO appt on 06/05/13 however she states that is too long to wait and was told by the "other doctors" patient needs to be seen sooner.  Explained to wife that this RN would send a message to Dr. Gerrit Friends and Arline Asp to what needs to be done.  Wife agreeable at this time.

## 2013-05-16 ENCOUNTER — Telehealth (INDEPENDENT_AMBULATORY_CARE_PROVIDER_SITE_OTHER): Payer: Self-pay

## 2013-05-16 NOTE — Telephone Encounter (Signed)
Pt states he is going some better. No fever,vomiting or nausea. Pt states voiding well. Having improved bowel movements. Per pt request appt moved up to 6-24. Pt offered appt for 6-12 but pt declined and asked for appt farther out. Pt to call if not continuing to improve so he can be seen sooner.

## 2013-05-29 ENCOUNTER — Encounter (INDEPENDENT_AMBULATORY_CARE_PROVIDER_SITE_OTHER): Payer: Self-pay | Admitting: Surgery

## 2013-05-29 ENCOUNTER — Ambulatory Visit (INDEPENDENT_AMBULATORY_CARE_PROVIDER_SITE_OTHER): Payer: Medicare Other | Admitting: Surgery

## 2013-05-29 VITALS — BP 140/72 | HR 76 | Temp 97.7°F | Resp 18 | Ht 66.0 in | Wt 184.0 lb

## 2013-05-29 DIAGNOSIS — K4090 Unilateral inguinal hernia, without obstruction or gangrene, not specified as recurrent: Secondary | ICD-10-CM

## 2013-05-29 NOTE — Progress Notes (Signed)
General Surgery [facility]  Visit Diagnoses: 1. Inguinal hernia unilateral, non-recurrent, left     HISTORY: Patient is an 79 year old male who underwent left inguinal hernia repair with mesh on May 10, 2013. Postoperative course was complicated by constipation requiring admission to the hospital for intravenous hydration. He resolved without further operative intervention. Overall he has done well since that time. He returns today for his first postoperative visit.  EXAM: Surgical incision is healing nicely. Mild soft tissue swelling. No sign of infection. No sign of recurrence with cough and Valsalva.  IMPRESSION: Status post left inguinal hernia repair with mesh  PLAN: Patient will begin applying topical creams to his incisions. He is restricted to 25 pounds lifting. He may begin advancing his physical activity. He will return for a final wound check in 6 weeks.  Velora Heckler, MD, FACS General & Endocrine Surgery [facility]

## 2013-05-29 NOTE — Patient Instructions (Signed)
  COCOA BUTTER & VITAMIN E CREAM  (Palmer's or other brand)  Apply cocoa butter/vitamin E cream to your incision 2 - 3 times daily.  Massage cream into incision for one minute with each application.  Use sunscreen (50 SPF or higher) for first 6 months after surgery if area is exposed to sun.  You may substitute Mederma or other scar reducing creams as desired.   

## 2013-06-05 ENCOUNTER — Encounter (INDEPENDENT_AMBULATORY_CARE_PROVIDER_SITE_OTHER): Payer: Medicare Other | Admitting: Surgery

## 2013-07-03 ENCOUNTER — Other Ambulatory Visit: Payer: Self-pay | Admitting: Internal Medicine

## 2013-07-03 NOTE — Telephone Encounter (Signed)
Ok to refill 

## 2013-07-03 NOTE — Telephone Encounter (Signed)
Please advise if okay to refill. Thanks.  

## 2013-07-03 NOTE — Telephone Encounter (Signed)
Rx has been called in per CY. 

## 2013-07-04 ENCOUNTER — Ambulatory Visit (INDEPENDENT_AMBULATORY_CARE_PROVIDER_SITE_OTHER): Payer: Medicare Other | Admitting: Surgery

## 2013-07-04 ENCOUNTER — Encounter (INDEPENDENT_AMBULATORY_CARE_PROVIDER_SITE_OTHER): Payer: Self-pay | Admitting: Surgery

## 2013-07-04 VITALS — BP 130/76 | HR 68 | Temp 97.6°F | Resp 16 | Ht 65.0 in | Wt 186.2 lb

## 2013-07-04 DIAGNOSIS — K4090 Unilateral inguinal hernia, without obstruction or gangrene, not specified as recurrent: Secondary | ICD-10-CM

## 2013-07-04 NOTE — Patient Instructions (Signed)
  COCOA BUTTER & VITAMIN E CREAM  (Palmer's or other brand)  Apply cocoa butter/vitamin E cream to your incision 2 - 3 times daily.  Massage cream into incision for one minute with each application.  Use sunscreen (50 SPF or higher) for first 6 months after surgery if area is exposed to sun.  You may substitute Mederma or other scar reducing creams as desired.   

## 2013-07-04 NOTE — Progress Notes (Signed)
General Surgery [facility]  Visit Diagnoses: 1. Inguinal hernia unilateral, non-recurrent, left     HISTORY: The patient is an 79 year old male who underwent left inguinal hernia repair the mesh on June the 50,014. He returns for final postoperative wound check.  EXAM: Surgical wound is well-healed. Mild soft tissue swelling in the inguinal canal. With cough and Valsalva there is no sign of recurrence.  IMPRESSION: Status post left inguinal hernia repair with mesh  PLAN: Patient is released to full activity without restriction. He will return for surgical care as needed.  Velora Heckler, MD, FACS General & Endocrine Surgery [facility]

## 2013-09-05 ENCOUNTER — Telehealth: Payer: Self-pay | Admitting: Internal Medicine

## 2013-09-05 ENCOUNTER — Ambulatory Visit: Payer: Medicare Other | Admitting: Internal Medicine

## 2013-09-05 MED ORDER — TRIAZOLAM 0.25 MG PO TABS: ORAL_TABLET | ORAL | Status: DC

## 2013-09-05 NOTE — Telephone Encounter (Signed)
I spoke with pt. He is requesting a refill on triazolam 0.25 mg. This was last refilled 07/03/13 #30 x 0 refills. Last ov 12/07/12. Told to f/u in 1 year. Please advise if okay to refill Dr. Maple Hudson thanks  [facility] [place]

## 2013-09-05 NOTE — Telephone Encounter (Signed)
Ok to refill until next ov, if confirm he has scheduled.

## 2013-09-05 NOTE — Telephone Encounter (Signed)
I have called RX into [facility]. Pt is aware. Nothing further needed

## 2013-09-06 ENCOUNTER — Encounter: Payer: Self-pay | Admitting: Cardiology

## 2013-09-06 ENCOUNTER — Ambulatory Visit (INDEPENDENT_AMBULATORY_CARE_PROVIDER_SITE_OTHER): Payer: Medicare Other | Admitting: Cardiology

## 2013-09-06 VITALS — BP 130/80 | HR 64 | Ht 65.0 in | Wt 182.0 lb

## 2013-09-06 DIAGNOSIS — I10 Essential (primary) hypertension: Secondary | ICD-10-CM

## 2013-09-06 DIAGNOSIS — E785 Hyperlipidemia, unspecified: Secondary | ICD-10-CM

## 2013-09-06 DIAGNOSIS — I2510 Atherosclerotic heart disease of native coronary artery without angina pectoris: Secondary | ICD-10-CM

## 2013-09-06 NOTE — Patient Instructions (Addendum)
Continue your current therapy  I will see you in one year   

## 2013-09-06 NOTE — Progress Notes (Signed)
Scott [name] Date of Birth: 04/28/1928 Medical Record #[id number]  History of Present Illness: Scott [name] is seen today for a follow up visit.  He has known CAD with remote CABG back in 2007. Negative stress testing in 2010. Other issues include obesity, HTN, HLD and OSA. When he was last seen he had problems with swelling in his legs. This resolved with stopping his Norvasc and increasing his Maxzide therapy. He has had a very eventful year. He suffered an Achilles tendon tear. He had surgery which then became infected and he required reoperation. He is also had a left inguinal hernia repair. This was followed by an episode of ileus with bowel obstruction. All these issues have not resolved. He has had no chest pain or shortness of breath. He is back walking again without difficulty. His blood pressure has been under good control.  Current Outpatient Prescriptions on File Prior to Visit  Medication Sig Dispense Refill  . aspirin 81 MG tablet Take 81 mg by mouth every evening.       . beta carotene w/minerals (OCUVITE) tablet Take 1 tablet by mouth every morning.      . calcium citrate (CALCITRATE - DOSED IN MG ELEMENTAL CALCIUM) 950 MG tablet Take 1 tablet by mouth every morning.      . clobetasol (TEMOVATE) 0.05 % external solution       . docusate sodium (COLACE) 100 MG capsule Take 200 mg by mouth at bedtime as needed for constipation.      . finasteride (PROSCAR) 5 MG tablet Take 5 mg by mouth daily before breakfast.       . fish oil-omega-3 fatty acids 1000 MG capsule Take 1 g by mouth every other day.       . fluticasone (FLONASE) 50 MCG/ACT nasal spray       . KLOR-CON 10 10 MEQ tablet       . lovastatin (MEVACOR) 20 MG tablet Take 20 mg by mouth at bedtime.       . metoprolol (LOPRESSOR) 50 MG tablet Take 25 mg by mouth 2 (two) times daily.      . potassium chloride (K-DUR,KLOR-CON) 10 MEQ tablet Take 10 mEq by mouth every evening.       . ranitidine (ZANTAC) 75 MG tablet Take 75 mg by  mouth daily before breakfast.      . Tamsulosin HCl (FLOMAX) 0.4 MG CAPS Take 0.4 mg by mouth daily after supper.      . temazepam (RESTORIL) 30 MG capsule Take 30 mg by mouth at bedtime as needed for sleep.       Marland Kitchen tetrahydrozoline 0.05 % ophthalmic solution Place 1 drop into both eyes daily as needed (for dry eyes).      . triamterene-hydrochlorothiazide (MAXZIDE) 75-50 MG per tablet Take 1 tablet by mouth daily before breakfast.       . triazolam (HALCION) 0.25 MG tablet TAKE ONE TABLET BY MOUTH AT BEDTIME AS NEEDED FOR SLEEP  30 tablet  3   No current facility-administered medications on file prior to visit.    Allergies  Allergen Reactions  . Amlodipine Swelling    Swelling of feet and legs  . Bactrim [Sulfamethoxazole-Trimethoprim] Other (See Comments)    Constipation   . Codeine Nausea Only  . Lovenox [Enoxaparin Sodium] Other (See Comments)    MAY HAVE CAUSED THROMBOCYTOPENIA  . Prednisone Swelling    Patient has tolerated doses for colds. Thinks it caused swelling in the past  . Shellfish Allergy  Nausea And Vomiting and Other (See Comments)    N&V, shaking  . Sonata [Zaleplon] Other (See Comments)    Hyper feelings    Past Medical History  Diagnosis Date  . Hypercholesterolemia   . GERD (gastroesophageal reflux disease)   . Sleep apnea     with use of CPAP  . Cranial neuropathy     resolved   . Hypertension   . BPH (benign prostatic hyperplasia)   . Thrombocytopenia     possibly due to Lovenox  . Obesity   . Anxiety     pt. reports that he has occas. nervous episodes   . H/O echocardiogram     last echo, stress test 05/2012- pt. followed by [place]   . Cancer     melanoma - removed L ear - 2010  . Anemia     Acute blood loss anemia secondary to surgery, followed fr/ Hip replacement   . CAD (coronary artery disease)     s/p CABG in 2007  CARDILOLOGIST IS DR. P. [place]  . SOB (shortness of breath)     WITH EXERTION  . Generalized OA   . Osteoarthritis      End-stage osteoarthritis, right hip    Past Surgical History  Procedure Laterality Date  . Coronary artery bypass graft  2007    LIMA GRAFT TO THE LAD, SAPHENOUS VEIN GRAFT TO THE FIRST OBTUSE MARIGNAL, SAPHENOUS VEIN GRAFT TO THE SECOND OBTUSE MARGINAL AND A SAPHENOUS VEIN GRAFT TO THE ACUTE MARGINAL AND POSTERIOR LATERAL BRANCH  . Total hip arthroplasty      right  . Appendectomy    . Carpel tunnel surgery-bilateral      both hands   . Knee  11/12    left arthroscopic  . Cardiac catheterization  12/15/2005    NORMAL. EF 60%, angioplasty 1995  . Artery biopsy      temporal - L side- wnl, double vision treated /w prednisone - 1991  . Achilles tendon surgery  10/06/2012    Procedure: ACHILLES TENDON REPAIR;  Surgeon: Thera Flake., MD;  Location: [facility] OR;  Service: Orthopedics;  Laterality: Right;  REPAIR RIGHT RUPTURE ACHILLES TENDON PRIMARY OPEN/PERCUTANEOUS, EXCISION PARTIAL BONE TALUS/CANCANEUS, REPAIR PARTIAL EXCISION CALCANEUS  . Calcaneal osteotomy  10/06/2012    Procedure: CALCANEAL OSTEOTOMY;  Surgeon: Thera Flake., MD;  Location: [facility] OR;  Service: Orthopedics;  Laterality: Right;  . I&d extremity  12/11/2012    Procedure: IRRIGATION AND DEBRIDEMENT EXTREMITY;  Surgeon: Nadara Mustard, MD;  Location: [facility] OR;  Service: Orthopedics;  Laterality: Right;  Irrigation and Debridement achilles, wound closure, apply wound vac, place antibiotic beads  . Joint replacement    . Inguinal hernia repair Left 05/10/2013    Procedure: HERNIA REPAIR INGUINAL ADULT;  Surgeon: Velora Heckler, MD;  Location: [facility] ORS;  Service: General;  Laterality: Left;  . Insertion of mesh Left 05/10/2013    Procedure: INSERTION OF MESH;  Surgeon: Velora Heckler, MD;  Location: [facility] ORS;  Service: General;  Laterality: Left;  . Hernia repair      History  Smoking status  . Never Smoker   Smokeless tobacco  . Never Used    History  Alcohol Use No    Family History  Problem Relation Age of Onset  . Prostate cancer  Brother   . Heart disease Brother   . Heart disease Brother     Review of Systems: As noted in history of present illness.  All other systems were reviewed and are negative.  Physical Exam: BP 130/80  Pulse 64  Ht 5\' 5"  (1.651 m)  Wt 182 lb (82.555 kg)  BMI 30.29 kg/m2 Patient is very pleasant and in no acute distress. Skin is warm and dry. Color is normal.  HEENT is unremarkable. Normocephalic/atraumatic. PERRL. Sclera are nonicteric. Neck is supple. No masses. No JVD. Lungs are clear. Cardiac exam shows a regular rate and rhythm. Abdomen is soft. Extremities are without edema. Gait and ROM are intact. No gross neurologic deficits noted.  LABORATORY DATA: ECG today demonstrates normal sinus rhythm with first-degree AV block. He has nonspecific ST abnormality.  Assessment / Plan: 1. Coronary disease status post CABG in 2007. He remains asymptomatic. We will continue with aspirin, metoprolol, and statin therapy. I'll followup again in one year.  2. Hypertension-well-controlled. Continue Maxzide and metoprolol.  3. Hyperlipidemia-continue lovastatin.

## 2013-12-03 ENCOUNTER — Ambulatory Visit (INDEPENDENT_AMBULATORY_CARE_PROVIDER_SITE_OTHER): Payer: Medicare Other | Admitting: Internal Medicine

## 2013-12-03 ENCOUNTER — Encounter: Payer: Self-pay | Admitting: Internal Medicine

## 2013-12-03 VITALS — BP 130/72 | HR 67 | Ht 65.0 in | Wt 187.8 lb

## 2013-12-03 DIAGNOSIS — G4733 Obstructive sleep apnea (adult) (pediatric): Secondary | ICD-10-CM

## 2013-12-03 DIAGNOSIS — G4700 Insomnia, unspecified: Secondary | ICD-10-CM

## 2013-12-03 MED ORDER — TEMAZEPAM 30 MG PO CAPS: 30.0000 mg | ORAL_CAPSULE | Freq: Every evening | ORAL | Status: DC | PRN

## 2013-12-03 MED ORDER — TRIAZOLAM 0.25 MG PO TABS: ORAL_TABLET | ORAL | Status: DC

## 2013-12-03 NOTE — Patient Instructions (Addendum)
We can continue CPAP 10/ [facility]  You can talk with [facility] about looking at a full-face mask. It might leak less and help better with dry mouth, but you just have to see what you like best.   Scripts printed refilling temazepam and halcion

## 2013-12-03 NOTE — Progress Notes (Signed)
05/23/12- 79 yo M never smoker Former patient-last seen 2007 for OSA/ [facility]- CPAPhas not been serviced in a long time. Wife here NPSG 10/14/02- AHI 15.9/ hr, CPAP titrated to 8, body weight 180 lbs.  He has not used CPAP in 2 years but admits now that he was better off when he wore it. He had taken Halcion in the past but has run out. Usual bedtime 10 or 10:30 PM with prolonged sleep latency and frequent waking after sleep onset before up between 7 and 8 AM. Weight has been stable. 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 Medical history is significant for coronary disease with bypass grafting.  07/06/12- 79 yo M never smoker Former patient-last seen 2007 for OSA/ [facility]- CPAP, Rhinitis, complicated by CAD/ CABG. Needs Rx sent to [place] for new CPAP; wears for approx 6 hours; unsure if losing pressure or not. Needs new CPAP machine. New problem-3 weeks myalgias, cough, sore throat, no fever. Wife had this illness first. Similar illness in the family the last 3 summers- has needed steroids  09/05/12- 79 yo M never smoker Former patient-last seen 2007 for OSA/ [facility]- CPAP, Rhinitis, complicated by CAD/ CABG.  wife here Wife had been waking husband because of apnea and loud snore. No longer snores wearing CPAP, likes nasal mask. Disliked Halcion. NPSG 07/30/12- mild obstructive sleep apnea with AHI 6.7 per hour  12/07/12- 79 yo M never smoker followed for OSA/ [facility]- CPAP, Rhinitis, complicated by CAD/ CABG.  wife here FOLLOWS FOR: wears CPAP 10/ [facility] every night for about 5-8 hours; pressure working well for patient. Refill for Triazolam He thinks temazepam causes some mild malaise and prefers Halcion/ triazolam. He sleeps much better with medication. We reviewed sleep hygiene issues of sedating medication again with his wife present for her input. Right foot is in boot because of Achilles tendon injury //  Need ONOX on CPAP room air?//  12/03/13- 79 yo M never smoker  followed for OSA/ [facility]- CPAP, Rhinitis, complicated by CAD/ CABG.  wife here FOLLOWS FOR: Wears CPAP 10/ [facility] every night(mostly) for about 5-6 hours; DME is [facility]. No supplies needed at this time. Restless sleep, change his position often causing mask leaks. Still some dry mouth. Alternates Halcion with temazepam to avoid tolerance, but temazepam is associated with vivid dreams.  ROS-see HPI Constitutional:   No-   weight loss, night sweats, fevers, chills, +fatigue, lassitude. HEENT:   No-  headaches, difficulty swallowing, tooth/dental problems, sore throat,       No-  sneezing, itching, ear ache, +nasal congestion, post nasal drip,  CV:  No-   chest pain, orthopnea, PND, +swelling in lower extremities, No-anasarca, dizziness, palpitations Resp: +  shortness of breath with exertion or at rest.              No-   productive cough,  No non-productive cough,  No- coughing up of blood.              No-   change in color of mucus.  No- wheezing.   Skin: No-   rash or lesions. GI:  No-   heartburn, indigestion, abdominal pain, nausea, vomiting,  GU: n. MS:  No-   joint pain or swelling.   Neuro-     nothing unusual Psych:  No- change in mood or affect. No depression or anxiety.  No memory loss.  OBJ- Physical Exam General- Alert, Oriented, Affect-appropriate, Distress- none acute, overweight Skin- rash-none,  lesions- none, excoriation- none Lymphadenopathy- none Head- atraumatic            Eyes- Gross vision intact, PERRLA, conjunctivae and secretions clear            Ears- Hearing, canals-normal            Nose- Clear, no-Septal dev, mucus, polyps, erosion, perforation             Throat- Mallampati III-IV , mucosa+minimally dry , drainage- none, tonsils- atrophic Neck- flexible , trachea midline, no stridor , thyroid nl, carotid no bruit Chest - symmetrical excursion , unlabored           Heart/CV- RRR , no murmur , no gallop  , no rub, nl s1 s2                           - JVD- none ,  edema- none, stasis changes- none, varices- none           Lung- clear to P&A, wheeze- none, cough- none , dullness-none, rub- none           Chest wall-  Abd-  Br/ Gen/ Rectal- Not done, not indicated Extrem- cyanosis- none, clubbing, none, atrophy- none, strength- nl Neuro- grossly intact to observation

## 2013-12-07 ENCOUNTER — Ambulatory Visit: Payer: Medicare Other | Admitting: Internal Medicine

## 2013-12-23 NOTE — Assessment & Plan Note (Signed)
Ok to continue alternating temazepam and halcion, with discussion

## 2013-12-23 NOTE — Assessment & Plan Note (Signed)
Good compliance and control on CPAP 10/ AQdvanced May need full-face mask for less leak

## 2014-06-09 ENCOUNTER — Other Ambulatory Visit: Payer: Self-pay | Admitting: Internal Medicine

## 2014-06-10 NOTE — Telephone Encounter (Signed)
Ok to refill 

## 2014-06-10 NOTE — Telephone Encounter (Signed)
CY, Please advise if okay to refill. Thanks.  

## 2014-09-06 ENCOUNTER — Encounter: Payer: Self-pay | Admitting: Cardiology

## 2014-09-06 ENCOUNTER — Ambulatory Visit (INDEPENDENT_AMBULATORY_CARE_PROVIDER_SITE_OTHER): Payer: Commercial Managed Care - HMO | Admitting: Cardiology

## 2014-09-06 VITALS — BP 128/70 | HR 59 | Ht 65.0 in | Wt 185.0 lb

## 2014-09-06 DIAGNOSIS — I10 Essential (primary) hypertension: Secondary | ICD-10-CM

## 2014-09-06 DIAGNOSIS — E785 Hyperlipidemia, unspecified: Secondary | ICD-10-CM

## 2014-09-06 DIAGNOSIS — I2510 Atherosclerotic heart disease of native coronary artery without angina pectoris: Secondary | ICD-10-CM

## 2014-09-06 NOTE — Progress Notes (Signed)
Scott [name] Date of Birth: [birth year] Medical Record #[id number]  History of Present Illness: Scott [name] is seen today for a follow up visit.  He has known CAD with remote CABG back in 2007. Negative stress Myoview in 2010. Other issues include obesity, HTN, HLD and OSA.  He has had no chest pain or shortness of breath.  His blood pressure has been under good control. He is mainly limited by left knee arthritis. He may consider TKR but given multiple surgeries last year he is going to try an avoid. He remains very active with yard work and gardening.  Current Outpatient Prescriptions on File Prior to Visit  Medication Sig Dispense Refill  . aspirin 81 MG tablet Take 81 mg by mouth every evening.       . beta carotene w/minerals (OCUVITE) tablet Take 1 tablet by mouth every morning.      . bisacodyl (DULCOLAX) 5 MG EC tablet Take 5 mg by mouth daily as needed for constipation.      . calcium citrate (CALCITRATE - DOSED IN MG ELEMENTAL CALCIUM) 950 MG tablet Take 1 tablet by mouth every morning.      . clobetasol (TEMOVATE) 0.05 % external solution       . docusate sodium (COLACE) 100 MG capsule Take 200 mg by mouth at bedtime as needed for constipation.      . finasteride (PROSCAR) 5 MG tablet Take 5 mg by mouth daily before breakfast.       . fish oil-omega-3 fatty acids 1000 MG capsule Take 1 g by mouth every other day.       . fluticasone (FLONASE) 50 MCG/ACT nasal spray       . lovastatin (MEVACOR) 20 MG tablet Take 20 mg by mouth at bedtime.       . metoprolol (LOPRESSOR) 50 MG tablet Take 25 mg by mouth 2 (two) times daily.      . potassium chloride (K-DUR,KLOR-CON) 10 MEQ tablet Take 10 mEq by mouth every evening.       . ranitidine (ZANTAC) 75 MG tablet Take 75 mg by mouth daily before breakfast.      . Tamsulosin HCl (FLOMAX) 0.4 MG CAPS Take 0.4 mg by mouth daily after supper.      . temazepam (RESTORIL) 30 MG capsule Take 1 capsule (30 mg total) by mouth at bedtime as needed for  sleep.  30 capsule  5  . tetrahydrozoline 0.05 % ophthalmic solution Place 1 drop into both eyes daily as needed (for dry eyes).      . triamterene-hydrochlorothiazide (MAXZIDE) 75-50 MG per tablet Take 1 tablet by mouth daily before breakfast.       . triazolam (HALCION) 0.25 MG tablet TAKE 1 TABLET BY MOUTH EVERY DAY AT BEDTIME AS NEEDED FOR SLEEP  30 tablet  5   No current facility-administered medications on file prior to visit.    Allergies  Allergen Reactions  . Amlodipine Swelling    Swelling of feet and legs  . Bactrim [Sulfamethoxazole-Trimethoprim] Other (See Comments)    Constipation   . Codeine Nausea Only  . Lovenox [Enoxaparin Sodium] Other (See Comments)    MAY HAVE CAUSED THROMBOCYTOPENIA  . Prednisone Swelling    Patient has tolerated doses for colds. Thinks it caused swelling in the past  . Shellfish Allergy Nausea And Vomiting and Other (See Comments)    N&V, shaking  . Sonata [Zaleplon] Other (See Comments)    Hyper feelings    Past Medical History  Diagnosis Date  . Hypercholesterolemia   . GERD (gastroesophageal reflux disease)   . Sleep apnea     with use of CPAP  . Cranial neuropathy     resolved   . Hypertension   . BPH (benign prostatic hyperplasia)   . Thrombocytopenia     possibly due to Lovenox  . Obesity   . Anxiety     pt. reports that he has occas. nervous episodes   . H/O echocardiogram     last echo, stress test 05/2012- pt. followed by [place]   . Cancer     melanoma - removed L ear - 2010  . Anemia     Acute blood loss anemia secondary to surgery, followed fr/ Hip replacement   . CAD (coronary artery disease)     s/p CABG in 2007  CARDILOLOGIST IS DR. P. [place]  . SOB (shortness of breath)     WITH EXERTION  . Generalized OA   . Osteoarthritis     End-stage osteoarthritis, right hip    Past Surgical History  Procedure Laterality Date  . Coronary artery bypass graft  2007    LIMA GRAFT TO THE LAD, SAPHENOUS VEIN GRAFT TO THE  FIRST OBTUSE MARIGNAL, SAPHENOUS VEIN GRAFT TO THE SECOND OBTUSE MARGINAL AND A SAPHENOUS VEIN GRAFT TO THE ACUTE MARGINAL AND POSTERIOR LATERAL BRANCH  . Total hip arthroplasty      right  . Appendectomy    . Carpel tunnel surgery-bilateral      both hands   . Knee  11/12    left arthroscopic  . Cardiac catheterization  12/15/2005    NORMAL. EF 60%, angioplasty 1995  . Artery biopsy      temporal - L side- wnl, double vision treated /w prednisone - 1991  . Achilles tendon surgery  10/06/2012    Procedure: ACHILLES TENDON REPAIR;  Surgeon: Thera Flake., MD;  Location: [facility] OR;  Service: Orthopedics;  Laterality: Right;  REPAIR RIGHT RUPTURE ACHILLES TENDON PRIMARY OPEN/PERCUTANEOUS, EXCISION PARTIAL BONE TALUS/CANCANEUS, REPAIR PARTIAL EXCISION CALCANEUS  . Calcaneal osteotomy  10/06/2012    Procedure: CALCANEAL OSTEOTOMY;  Surgeon: Thera Flake., MD;  Location: [facility] OR;  Service: Orthopedics;  Laterality: Right;  . I&d extremity  12/11/2012    Procedure: IRRIGATION AND DEBRIDEMENT EXTREMITY;  Surgeon: Nadara Mustard, MD;  Location: [facility] OR;  Service: Orthopedics;  Laterality: Right;  Irrigation and Debridement achilles, wound closure, apply wound vac, place antibiotic beads  . Joint replacement    . Inguinal hernia repair Left 05/10/2013    Procedure: HERNIA REPAIR INGUINAL ADULT;  Surgeon: Velora Heckler, MD;  Location: [facility] ORS;  Service: General;  Laterality: Left;  . Insertion of mesh Left 05/10/2013    Procedure: INSERTION OF MESH;  Surgeon: Velora Heckler, MD;  Location: [facility] ORS;  Service: General;  Laterality: Left;  . Hernia repair      History  Smoking status  . Never Smoker   Smokeless tobacco  . Never Used    History  Alcohol Use No    Family History  Problem Relation Age of Onset  . Prostate cancer Brother   . Heart disease Brother   . Heart disease Brother     Review of Systems: As noted in history of present illness.  All other systems were reviewed and are  negative.  Physical Exam: BP 128/70  Pulse 59  Ht 5\' 5"  (1.651 m)  Wt 185 lb (83.915 kg)  BMI 30.79  kg/m2 Patient is very pleasant and in no acute distress. Skin is warm and dry. Color is normal.  HEENT is unremarkable. Normocephalic/atraumatic. PERRL. Sclera are nonicteric. Neck is supple. No masses. No JVD. Lungs are clear. Cardiac exam shows a regular rate and rhythm. Abdomen is soft. Extremities are without edema. Gait and ROM are intact. No gross neurologic deficits noted.  LABORATORY DATA: ECG today demonstrates normal sinus rhythm with first-degree AV block. He has nonspecific ST abnormality. This is unchanged.  Assessment / Plan: 1. Coronary disease status post CABG in 2007. He remains asymptomatic. We will continue with aspirin, metoprolol, and statin therapy. I'll followup again in one year. If he were to require elective knee surgery I would recommend pre op risk assessment with a Lexiscan myoview.  2. Hypertension-well-controlled. Continue Maxzide and metoprolol.  3. Hyperlipidemia-continue lovastatin. Labs from primary care reviewed and are scanned into record. Excellent LDL control.

## 2014-12-03 ENCOUNTER — Encounter: Payer: Self-pay | Admitting: Internal Medicine

## 2014-12-03 ENCOUNTER — Ambulatory Visit (INDEPENDENT_AMBULATORY_CARE_PROVIDER_SITE_OTHER): Payer: Commercial Managed Care - HMO | Admitting: Internal Medicine

## 2014-12-03 VITALS — BP 138/80 | HR 64 | Ht 66.0 in | Wt 188.0 lb

## 2014-12-03 DIAGNOSIS — G4700 Insomnia, unspecified: Secondary | ICD-10-CM

## 2014-12-03 DIAGNOSIS — G4733 Obstructive sleep apnea (adult) (pediatric): Secondary | ICD-10-CM

## 2014-12-03 MED ORDER — TRIAZOLAM 0.25 MG PO TABS: ORAL_TABLET | ORAL | Status: DC

## 2014-12-03 MED ORDER — TRAZODONE HCL 50 MG PO TABS: 50.0000 mg | ORAL_TABLET | Freq: Every day | ORAL | Status: DC

## 2014-12-03 NOTE — Patient Instructions (Signed)
We can continue CPAP 12/ Advanced  Script refilled for Halcion  Script to try trazodone instead of temazepam for sleep alternating with, or instead of Halcion as needed.  Please call as needed

## 2014-12-03 NOTE — Progress Notes (Signed)
05/23/12- 80 yo M never smoker Former patient-last seen 2007 for OSA/ [place] not been serviced in a long time. Wife here NPSG 10/14/02- AHI 15.9/ hr, CPAP titrated to 8, body weight 180 lbs.  He has not used CPAP in 2 years but admits now that he was better off when he wore it. He had taken Halcion in the past but has run out. Usual bedtime 10 or 10:30 PM with prolonged sleep latency and frequent waking after sleep onset before up between 7 and 8 AM. Weight has been stable. He works outdoors as a Development worker, international aid and is bothered by nasal stuffiness and drainage. Medical history is significant for coronary disease with bypass grafting.  07/06/12- 70 yo M never smoker Former patient-last seen 2007 for OSA/ [facility]- CPAP, Rhinitis, complicated by CAD/ CABG. Needs Rx sent to [facility] for new CPAP; wears for approx 6 hours; unsure if losing pressure or not. Needs new CPAP machine. New problem-3 weeks myalgias, cough, sore throat, no fever. Wife had this illness first. Similar illness in the family the last 3 summers- has needed steroids  09/05/12- 37 yo M never smoker Former patient-last seen 2007 for OSA/ [facility]- CPAP, Rhinitis, complicated by CAD/ CABG.  wife here Wife had been waking husband because of apnea and loud snore. No longer snores wearing CPAP, likes nasal mask. Disliked Halcion. NPSG 07/30/12- mild obstructive sleep apnea with AHI 6.7 per hour  12/07/12- 84 yo M never smoker followed for OSA/ [facility]- CPAP, Rhinitis, complicated by CAD/ CABG.  wife here FOLLOWS FOR: wears CPAP 10/ Advanced every night for about 5-8 hours; pressure working well for patient. Refill for Triazolam He thinks temazepam causes some mild malaise and prefers Halcion/ triazolam. He sleeps much better with medication. We reviewed sleep hygiene issues of sedating medication again with his wife present for her input. Right foot is in boot because of Achilles tendon injury //  Need ONOX on CPAP room air?//  12/03/13- 72 yo M never smoker  followed for OSA/ [facility]- CPAP, Rhinitis, complicated by CAD/ CABG.  wife here FOLLOWS FOR: Wears CPAP 10/ Advanced every night(mostly) for about 5-6 hours; DME is [facility]. No supplies needed at this time. Restless sleep, change his position often causing mask leaks. Still some dry mouth. Alternates Halcion with temazepam to avoid tolerance, but temazepam is associated with vivid dreams.  12/03/14- 40 yo M never smoker followed for OSA/ [facility]- CPAP, Insomnia, Rhinitis, complicated by CAD/ CABG.  wife here CPAP 10/ [facility] FOLLOWS FOR: wears cpap 6-8 hours nightly, no complaints with mask/supplies/pressure.  no other complaints.   Temazepam 30 mg last about 3 hours and is associated with unpleasant vivid dreams. Prefers Halcion and just uses temazepam for a few days occasionally to prevent developing tolerance to Halcion. Asks about alternatives.  ROS-see HPI Constitutional:   No-   weight loss, night sweats, fevers, chills, +fatigue, lassitude. HEENT:   No-  headaches, difficulty swallowing, tooth/dental problems, sore throat,       No-  sneezing, itching, ear ache, +nasal congestion, post nasal drip,  CV:  No-   chest pain, orthopnea, PND, +swelling in lower extremities, No-anasarca, dizziness, palpitations Resp: +  shortness of breath with exertion or at rest.              No-   productive cough,  No non-productive cough,  No- coughing up of blood.              No-   change in color of  mucus.  No- wheezing.   Skin: No-   rash or lesions. GI:  No-   heartburn, indigestion, abdominal pain, nausea, vomiting,  GU: n. MS:  No-   joint pain or swelling.   Neuro-     nothing unusual Psych:  No- change in mood or affect. No depression or anxiety.  No memory loss.  OBJ- Physical Exam General- Alert, Oriented, Affect-appropriate, Distress- none acute, overweight Skin- rash-none, lesions- none, excoriation- none Lymphadenopathy- none Head- atraumatic            Eyes- Gross vision intact, PERRLA, conjunctivae  and secretions clear            Ears- Hearing, canals-normal            Nose- Clear, no-Septal dev, mucus, polyps, erosion, perforation             Throat- Mallampati III-IV , mucosa+minimally dry , drainage- none, tonsils- atrophic Neck- flexible , trachea midline, no stridor , thyroid nl, carotid no bruit Chest - symmetrical excursion , unlabored           Heart/CV- RRR , no murmur , no gallop  , no rub, nl s1 s2                           - JVD- none , edema- none, stasis changes- none, varices- none           Lung- clear to P&A, wheeze- none, cough- none , dullness-none, rub- none           Chest wall-  Abd-  Br/ Gen/ Rectal- Not done, not indicated Extrem- cyanosis- none, clubbing, none, atrophy- none, strength- nl Neuro- grossly intact to observation

## 2014-12-07 NOTE — Assessment & Plan Note (Signed)
CPAP 10 seems to be appropriate still and compliance is good.

## 2014-12-07 NOTE — Assessment & Plan Note (Signed)
Chronic nonspecific insomnia. We again reviewed sleep hygiene and basic issues associated with sleeping pill use especially at his age. Plan-try changing temazepam to trazodone with option to drop off of Halcion if able

## 2014-12-17 ENCOUNTER — Telehealth: Payer: Self-pay | Admitting: Internal Medicine

## 2014-12-17 NOTE — Telephone Encounter (Signed)
Called [employer] - [phone]  Halcion 0.25mg  tablet needs PA patient ID [id number]  Spoke with Ed - PA initiated via telephone.  Sent to Clinical Pharmacy review to process request Will be notified via fax within 24-72 hours of determination.   Will send to [facility] to follow up on.

## 2014-12-30 NOTE — Telephone Encounter (Signed)
Katie please advise if you have received notification of coverage/denial. Thanks.

## 2015-01-06 DIAGNOSIS — I10 Essential (primary) hypertension: Secondary | ICD-10-CM | POA: Insufficient documentation

## 2015-01-06 DIAGNOSIS — Z1389 Encounter for screening for other disorder: Secondary | ICD-10-CM | POA: Insufficient documentation

## 2015-04-04 ENCOUNTER — Ambulatory Visit
Admission: RE | Admit: 2015-04-04 | Discharge: 2015-04-04 | Disposition: A | Discharge status: Home / Self Care | Payer: Commercial Managed Care - HMO | Source: Ambulatory Visit | Attending: Nurse Practitioner | Admitting: Nurse Practitioner

## 2015-04-04 ENCOUNTER — Other Ambulatory Visit: Payer: Self-pay | Admitting: Nurse Practitioner

## 2015-04-04 DIAGNOSIS — J069 Acute upper respiratory infection, unspecified: Secondary | ICD-10-CM

## 2015-05-24 ENCOUNTER — Other Ambulatory Visit: Payer: Self-pay | Admitting: Internal Medicine

## 2015-05-26 NOTE — Telephone Encounter (Signed)
Ok to refill 

## 2015-05-26 NOTE — Telephone Encounter (Signed)
Last refill on halcion 0.25 mg was 12/03/14 #30 x refill PRN.  Last OV 12/03/14 Pending 12/04/15 OV Please advise on refill thanks

## 2015-09-09 DIAGNOSIS — I2510 Atherosclerotic heart disease of native coronary artery without angina pectoris: Secondary | ICD-10-CM | POA: Insufficient documentation

## 2015-09-09 DIAGNOSIS — M1710 Unilateral primary osteoarthritis, unspecified knee: Secondary | ICD-10-CM | POA: Insufficient documentation

## 2015-09-09 DIAGNOSIS — C4490 Unspecified malignant neoplasm of skin, unspecified: Secondary | ICD-10-CM | POA: Insufficient documentation

## 2015-09-09 DIAGNOSIS — N529 Male erectile dysfunction, unspecified: Secondary | ICD-10-CM | POA: Insufficient documentation

## 2015-09-09 DIAGNOSIS — N400 Enlarged prostate without lower urinary tract symptoms: Secondary | ICD-10-CM | POA: Insufficient documentation

## 2015-09-09 DIAGNOSIS — J309 Allergic rhinitis, unspecified: Secondary | ICD-10-CM | POA: Insufficient documentation

## 2015-09-09 DIAGNOSIS — M169 Osteoarthritis of hip, unspecified: Secondary | ICD-10-CM | POA: Insufficient documentation

## 2015-09-09 DIAGNOSIS — F419 Anxiety disorder, unspecified: Secondary | ICD-10-CM | POA: Insufficient documentation

## 2015-09-09 DIAGNOSIS — K5900 Constipation, unspecified: Secondary | ICD-10-CM | POA: Insufficient documentation

## 2015-09-09 DIAGNOSIS — G479 Sleep disorder, unspecified: Secondary | ICD-10-CM | POA: Insufficient documentation

## 2015-09-09 DIAGNOSIS — N5201 Erectile dysfunction due to arterial insufficiency: Secondary | ICD-10-CM | POA: Insufficient documentation

## 2015-09-09 DIAGNOSIS — M179 Osteoarthritis of knee, unspecified: Secondary | ICD-10-CM | POA: Insufficient documentation

## 2015-09-09 DIAGNOSIS — E7800 Pure hypercholesterolemia, unspecified: Secondary | ICD-10-CM | POA: Insufficient documentation

## 2015-09-11 ENCOUNTER — Ambulatory Visit (INDEPENDENT_AMBULATORY_CARE_PROVIDER_SITE_OTHER): Payer: Commercial Managed Care - HMO | Admitting: Cardiology

## 2015-09-11 ENCOUNTER — Encounter: Payer: Self-pay | Admitting: Cardiology

## 2015-09-11 VITALS — BP 150/78 | HR 54 | Ht 65.0 in | Wt 180.4 lb

## 2015-09-11 DIAGNOSIS — E785 Hyperlipidemia, unspecified: Secondary | ICD-10-CM | POA: Diagnosis not present

## 2015-09-11 DIAGNOSIS — I2510 Atherosclerotic heart disease of native coronary artery without angina pectoris: Secondary | ICD-10-CM | POA: Diagnosis not present

## 2015-09-11 DIAGNOSIS — I10 Essential (primary) hypertension: Secondary | ICD-10-CM | POA: Diagnosis not present

## 2015-09-11 NOTE — Progress Notes (Signed)
Scott [name] Date of Birth: Jun 05, 1928 Medical Record #[id number]  History of Present Illness: Scott [name] is seen today for follow up CAD.  He has known CAD with remote CABG back in 2007. Negative stress Myoview in 2010. Other issues include obesity, HTN, HLD and OSA.  On follow up today he denies chest pain or shortness of breath.  His blood pressure has been under good control.  He remains very active with yard work and gardening. He notes some mild soreness of the sternum when he rubs it.   Current Outpatient Prescriptions on File Prior to Visit  Medication Sig Dispense Refill  . aspirin 81 MG tablet Take 81 mg by mouth every evening.     . beta carotene w/minerals (OCUVITE) tablet Take 1 tablet by mouth every morning.    . bisacodyl (DULCOLAX) 5 MG EC tablet Take 5 mg by mouth daily as needed for constipation.    . calcium citrate (CALCITRATE - DOSED IN MG ELEMENTAL CALCIUM) 950 MG tablet Take 1 tablet by mouth every morning.    . clobetasol (TEMOVATE) 0.05 % external solution     . finasteride (PROSCAR) 5 MG tablet Take 5 mg by mouth daily before breakfast.     . fish oil-omega-3 fatty acids 1000 MG capsule Take 1 g by mouth every other day.     . lovastatin (MEVACOR) 20 MG tablet Take 20 mg by mouth at bedtime.     . metoprolol (LOPRESSOR) 50 MG tablet Take 25 mg by mouth 2 (two) times daily.    . potassium chloride (K-DUR,KLOR-CON) 10 MEQ tablet Take 10 mEq by mouth every evening.     . ranitidine (ZANTAC) 75 MG tablet Take 75 mg by mouth daily before breakfast.    . Tamsulosin HCl (FLOMAX) 0.4 MG CAPS Take 0.4 mg by mouth daily after supper.    . temazepam (RESTORIL) 30 MG capsule Take 1 capsule (30 mg total) by mouth at bedtime as needed for sleep. 30 capsule 5  . tetrahydrozoline 0.05 % ophthalmic solution Place 1 drop into both eyes daily as needed (for dry eyes).    . traZODone (DESYREL) 50 MG tablet Take 1 tablet (50 mg total) by mouth at bedtime. 30 tablet prn  .  triamterene-hydrochlorothiazide (MAXZIDE) 75-50 MG per tablet Take 1 tablet by mouth daily before breakfast.     . triazolam (HALCION) 0.25 MG tablet TAKE 1 TABLET BY MOUTH EVERY DAY AT BEDTIME AS NEEDED FOR SLEEP 30 tablet 5  . vitamin E 400 UNIT capsule Take 400 Units by mouth as needed.     No current facility-administered medications on file prior to visit.    Allergies  Allergen Reactions  . Acetaminophen     Other reaction(s): mouth sores  . Amlodipine Swelling    Swelling of feet and legs  . Bactrim [Sulfamethoxazole-Trimethoprim] Other (See Comments)    Constipation   . Codeine Nausea Only  . Fexofenadine     Other reaction(s): mouth sores  . Ibuprofen     Other reaction(s): nervousness  . Lovenox [Enoxaparin Sodium] Other (See Comments)    MAY HAVE CAUSED THROMBOCYTOPENIA  . Naproxen     Other reaction(s): mouth sores  . Prednisone Swelling    Patient has tolerated doses for colds. Thinks it caused swelling in the past  . Shellfish Allergy Nausea And Vomiting and Other (See Comments)    N&V, shaking  . Sonata [Zaleplon] Other (See Comments)    Hyper feelings    Past  Medical History  Diagnosis Date  . Hypercholesterolemia   . GERD (gastroesophageal reflux disease)   . Sleep apnea     with use of CPAP  . Cranial neuropathy     resolved   . Hypertension   . BPH (benign prostatic hyperplasia)   . Thrombocytopenia (HCC)     possibly due to Lovenox  . Obesity   . Anxiety     pt. reports that he has occas. nervous episodes   . H/O echocardiogram     last echo, stress test 05/2012- pt. followed by [place]   . Cancer (HCC)     melanoma - removed L ear - 2010  . Anemia     Acute blood loss anemia secondary to surgery, followed fr/ Hip replacement   . CAD (coronary artery disease)     s/p CABG in 2007  CARDILOLOGIST IS DR. P. [place]  . SOB (shortness of breath)     WITH EXERTION  . Generalized OA   . Osteoarthritis     End-stage osteoarthritis, right hip     Past Surgical History  Procedure Laterality Date  . Coronary artery bypass graft  2007    LIMA GRAFT TO THE LAD, SAPHENOUS VEIN GRAFT TO THE FIRST OBTUSE MARIGNAL, SAPHENOUS VEIN GRAFT TO THE SECOND OBTUSE MARGINAL AND A SAPHENOUS VEIN GRAFT TO THE ACUTE MARGINAL AND POSTERIOR LATERAL BRANCH  . Total hip arthroplasty      right  . Appendectomy    . Carpel tunnel surgery-bilateral      both hands   . Knee  11/12    left arthroscopic  . Cardiac catheterization  12/15/2005    NORMAL. EF 60%, angioplasty 1995  . Artery biopsy      temporal - L side- wnl, double vision treated /w prednisone - 1991  . Achilles tendon surgery  10/06/2012    Procedure: ACHILLES TENDON REPAIR;  Surgeon: Thera Flake., MD;  Location: [facility] OR;  Service: Orthopedics;  Laterality: Right;  REPAIR RIGHT RUPTURE ACHILLES TENDON PRIMARY OPEN/PERCUTANEOUS, EXCISION PARTIAL BONE TALUS/CANCANEUS, REPAIR PARTIAL EXCISION CALCANEUS  . Calcaneal osteotomy  10/06/2012    Procedure: CALCANEAL OSTEOTOMY;  Surgeon: Thera Flake., MD;  Location: [facility] OR;  Service: Orthopedics;  Laterality: Right;  . I&d extremity  12/11/2012    Procedure: IRRIGATION AND DEBRIDEMENT EXTREMITY;  Surgeon: Nadara Mustard, MD;  Location: [facility] OR;  Service: Orthopedics;  Laterality: Right;  Irrigation and Debridement achilles, wound closure, apply wound vac, place antibiotic beads  . Joint replacement    . Inguinal hernia repair Left 05/10/2013    Procedure: HERNIA REPAIR INGUINAL ADULT;  Surgeon: Velora Heckler, MD;  Location: [facility] ORS;  Service: General;  Laterality: Left;  . Insertion of mesh Left 05/10/2013    Procedure: INSERTION OF MESH;  Surgeon: Velora Heckler, MD;  Location: [facility] ORS;  Service: General;  Laterality: Left;  . Hernia repair      History  Smoking status  . Never Smoker   Smokeless tobacco  . Never Used    History  Alcohol Use No    Family History  Problem Relation Age of Onset  . Prostate cancer Brother   . Heart disease Brother    . Heart disease Brother     Review of Systems: As noted in history of present illness. He did have surgery for a trigger finger this year. All other systems were reviewed and are negative.  Physical Exam: BP 150/78 mmHg  Pulse 54  Ht 5\' 5"  (1.651 m)  Wt 81.829 kg (180 lb 6.4 oz)  BMI 30.02 kg/m2 Patient is very pleasant and in no acute distress. Skin is warm and dry. Color is normal.  HEENT is unremarkable. Normocephalic/atraumatic. PERRL. Sclera are nonicteric. Neck is supple. No masses. No JVD. Lungs are clear. Cardiac exam shows a regular rate and rhythm. Abdomen is soft. Extremities are without edema. Gait and ROM are intact. No gross neurologic deficits noted.  LABORATORY DATA: ECG today demonstrates sinus brady with first-degree AV block. Rate 54. Otherwise normal. This is unchanged. I have personally reviewed and interpreted this study.   Assessment / Plan: 1. Coronary disease status post CABG in 2007. He remains asymptomatic. We will continue with aspirin, metoprolol, and statin therapy. I'll followup again in one year.  2. Hypertension-well-controlled. Continue Maxzide and metoprolol.  3. Hyperlipidemia-continue lovastatin. Labs from primary care.

## 2015-09-11 NOTE — Patient Instructions (Signed)
Continue your current therapy  I will see you in one year   

## 2015-10-03 IMAGING — CR DG CHEST 2V
2 series · 2 of 2 positions shown · non-contrast
Comparison: PA and lateral chest x-ray October 05, 2012

CLINICAL DATA: Recurrent cough, history of upper respiratory
infections, history of previous CABG

EXAM:
CHEST  2 VIEW

[view not recorded (1 of 2)]
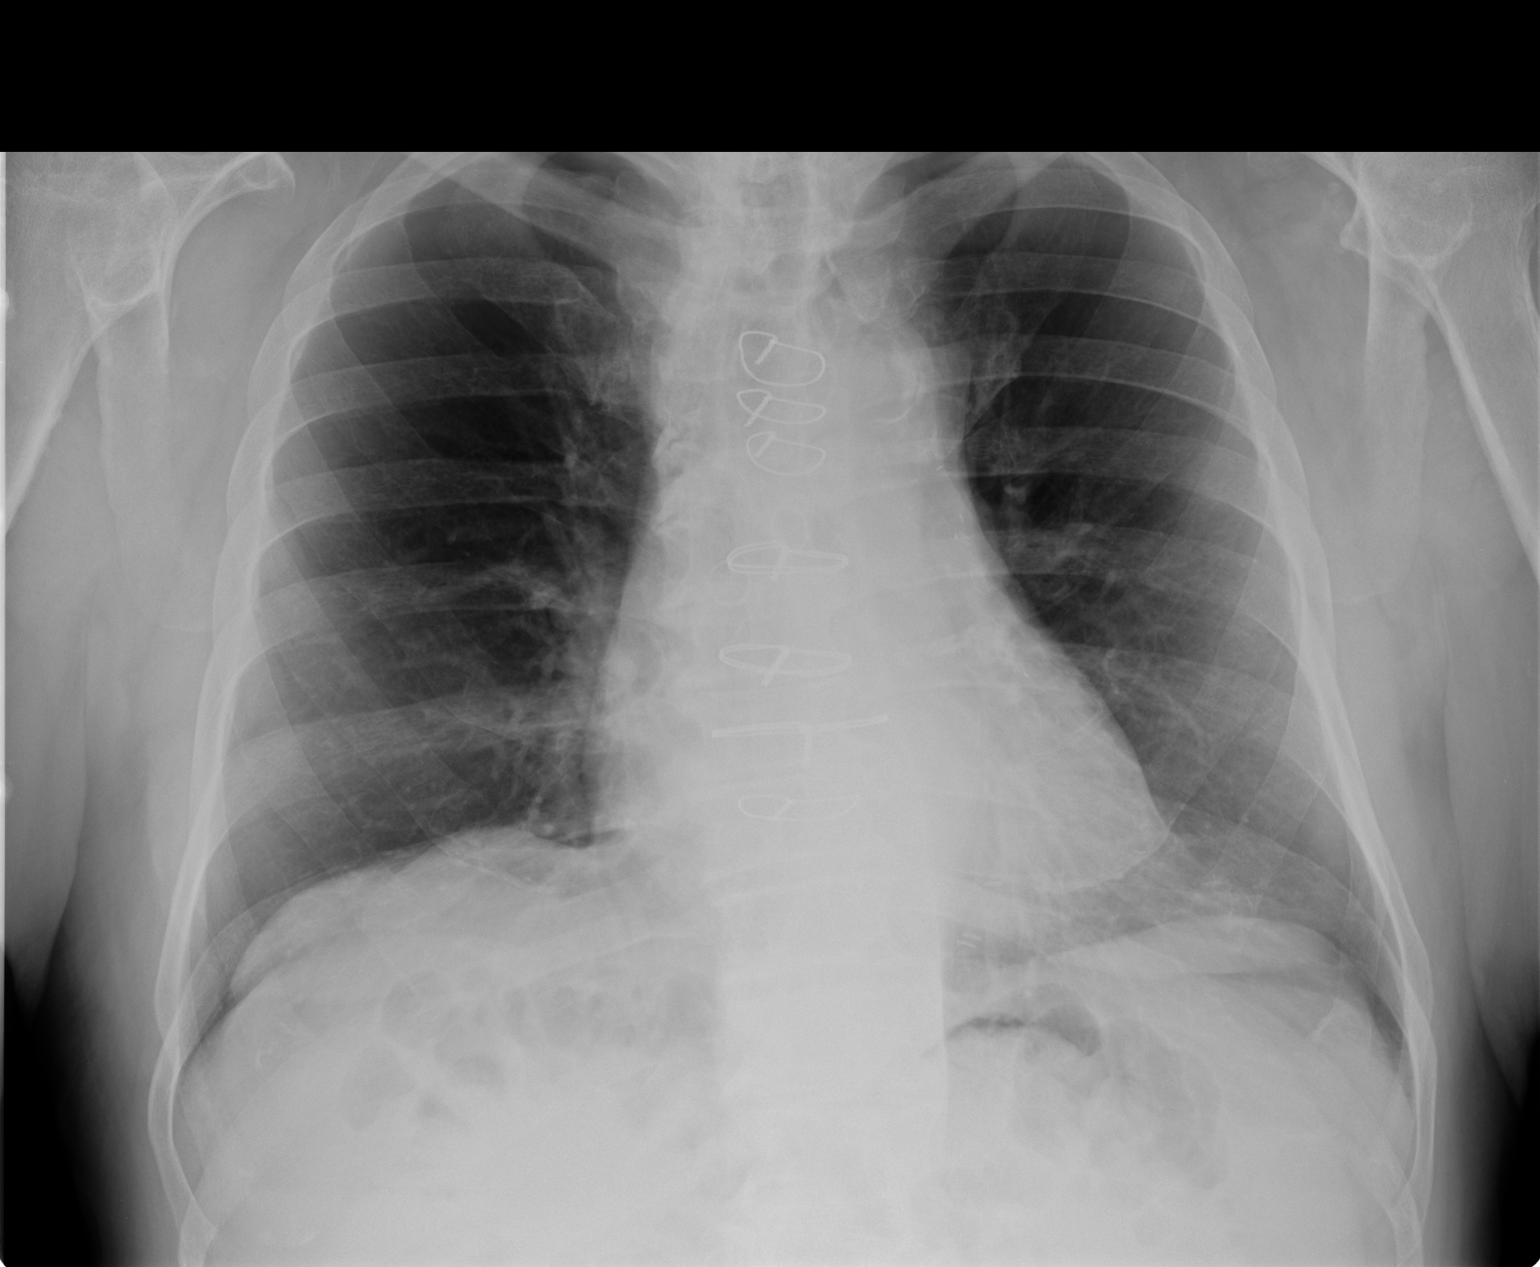

[view not recorded (2 of 2)]
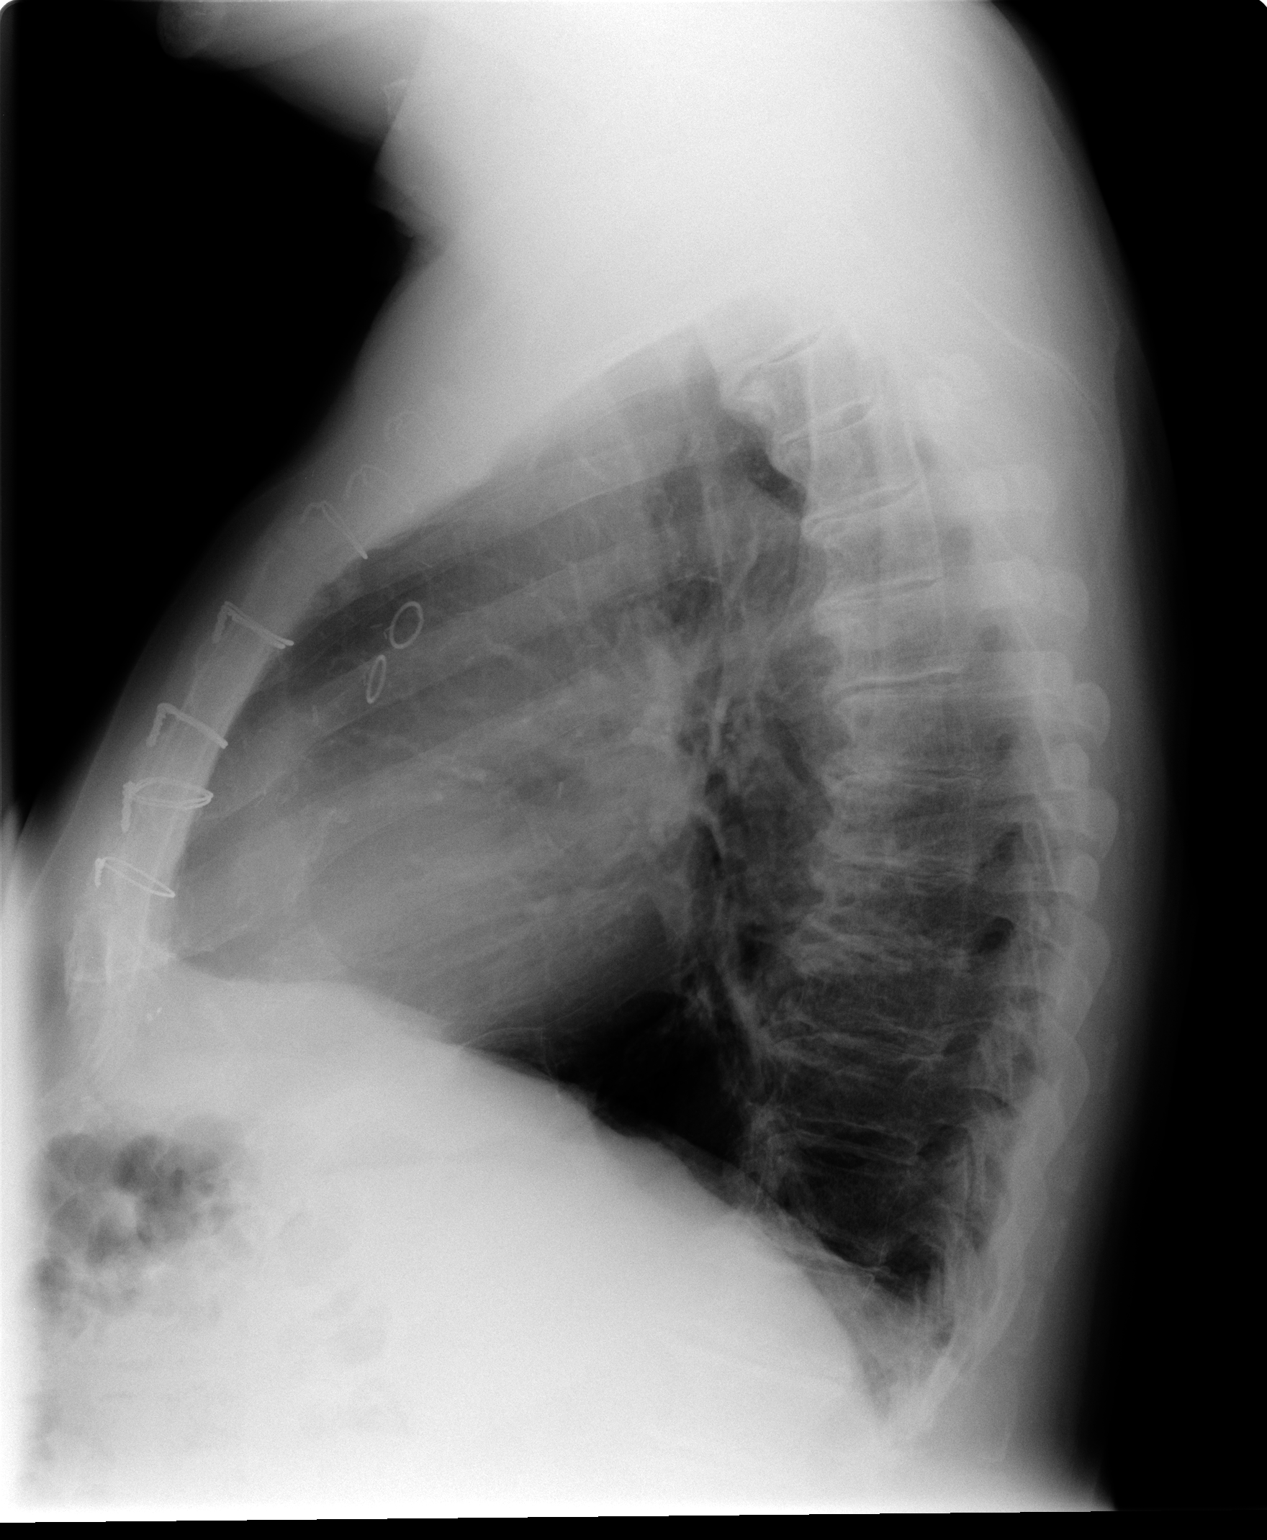

[2 of 2 positions shown; findings below may reference images not displayed]

FINDINGS: The lungs are well-expanded. There is no focal infiltrate. There is
no pleural effusion. The heart is normal in size. The pulmonary
vascularity is not engorged. There is 7 intact sternal wires. The
trachea is midline. There is multilevel degenerative disc space
narrowing with anterior endplate osteophytes.
IMPRESSION: There is no evidence of pneumonia, CHF, nor other acute
cardiopulmonary disease.

## 2015-12-02 ENCOUNTER — Telehealth: Payer: Self-pay | Admitting: Internal Medicine

## 2015-12-02 NOTE — Telephone Encounter (Signed)
Message not needed for call.  Closed encounter.

## 2015-12-04 ENCOUNTER — Ambulatory Visit (INDEPENDENT_AMBULATORY_CARE_PROVIDER_SITE_OTHER): Payer: Commercial Managed Care - HMO | Admitting: Internal Medicine

## 2015-12-04 ENCOUNTER — Encounter: Payer: Self-pay | Admitting: Internal Medicine

## 2015-12-04 VITALS — BP 128/62 | HR 57 | Ht 65.0 in | Wt 186.2 lb

## 2015-12-04 DIAGNOSIS — G4733 Obstructive sleep apnea (adult) (pediatric): Secondary | ICD-10-CM

## 2015-12-04 MED ORDER — ZOLPIDEM TARTRATE 5 MG PO TABS: ORAL_TABLET | ORAL | Status: DC

## 2015-12-04 NOTE — Patient Instructions (Addendum)
We can continue CPAP 10/ Advanced  For the recent dry nose- consider turning the humidifier down just a little so it doesn't run dry before you get up.                 You can also try nasal saline gel at bedtime   Script to try Ambien instead of halcion for sleep if needed

## 2015-12-04 NOTE — Progress Notes (Signed)
05/23/12- 81 yo M never smoker Former patient-last seen 2007 for OSA/ [place] not been serviced in a long time. Wife here NPSG 10/14/02- AHI 15.9/ hr, CPAP titrated to 8, body weight 180 lbs.  He has not used CPAP in 2 years but admits now that he was better off when he wore it. He had taken Halcion in the past but has run out. Usual bedtime 10 or 10:30 PM with prolonged sleep latency and frequent waking after sleep onset before up between 7 and 8 AM. Weight has been stable. He works outdoors as a Development worker, international aid and is bothered by nasal stuffiness and drainage. Medical history is significant for coronary disease with bypass grafting.  07/06/12- 89 yo M never smoker Former patient-last seen 2007 for OSA/ [facility]- CPAP, Rhinitis, complicated by CAD/ CABG. Needs Rx sent to [facility] for new CPAP; wears for approx 6 hours; unsure if losing pressure or not. Needs new CPAP machine. New problem-3 weeks myalgias, cough, sore throat, no fever. Wife had this illness first. Similar illness in the family the last 3 summers- has needed steroids  09/05/12- 56 yo M never smoker Former patient-last seen 2007 for OSA/ [facility]- CPAP, Rhinitis, complicated by CAD/ CABG.  wife here Wife had been waking husband because of apnea and loud snore. No longer snores wearing CPAP, likes nasal mask. Disliked Halcion. NPSG 07/30/12- mild obstructive sleep apnea with AHI 6.7 per hour  12/07/12- 57 yo M never smoker followed for OSA/ [facility]- CPAP, Rhinitis, complicated by CAD/ CABG.  wife here FOLLOWS FOR: wears CPAP 10/ Advanced every night for about 5-8 hours; pressure working well for patient. Refill for Triazolam He thinks temazepam causes some mild malaise and prefers Halcion/ triazolam. He sleeps much better with medication. We reviewed sleep hygiene issues of sedating medication again with his wife present for her input. Right foot is in boot because of Achilles tendon injury //  Need ONOX on CPAP room air?//  12/03/13- 31 yo M never smoker  followed for OSA/ [facility]- CPAP, Rhinitis, complicated by CAD/ CABG.  wife here FOLLOWS FOR: Wears CPAP 10/ Advanced every night(mostly) for about 5-6 hours; DME is [facility]. No supplies needed at this time. Restless sleep, change his position often causing mask leaks. Still some dry mouth. Alternates Halcion with temazepam to avoid tolerance, but temazepam is associated with vivid dreams.  12/03/14- 18 yo M never smoker followed for OSA/ [facility]- CPAP, Insomnia, Rhinitis, complicated by CAD/ CABG.  wife here CPAP 10/ [facility] FOLLOWS FOR: wears cpap 6-8 hours nightly, no complaints with mask/supplies/pressure.  no other complaints.   Temazepam 30 mg last about 3 hours and is associated with unpleasant vivid dreams. Prefers Halcion and just uses temazepam for a few days occasionally to prevent developing tolerance to Halcion. Asks about alternatives.  12/04/2015-81 year old male never smoker followed for OSA/[facility]-CPAP, insomnia, rhinitis, complicated by CAD/CABG CPAP 10/Advanced FOLLOWS FOR: pt. states he wears CPAP 7hr. everynight. pressure is good. supplies needed (mask). DME:[facility]     ROS-see HPI Constitutional:   No-   weight loss, night sweats, fevers, chills, +fatigue, lassitude. HEENT:   No-  headaches, difficulty swallowing, tooth/dental problems, sore throat,       No-  sneezing, itching, ear ache, +nasal congestion, post nasal drip,  CV:  No-   chest pain, orthopnea, PND, +swelling in lower extremities, No-anasarca, dizziness, palpitations Resp: +  shortness of breath with exertion or at rest.              No-  productive cough,  No non-productive cough,  No- coughing up of blood.              No-   change in color of mucus.  No- wheezing.   Skin: No-   rash or lesions. GI:  No-   heartburn, indigestion, abdominal pain, nausea, vomiting,  GU: n. MS:  No-   joint pain or swelling.   Neuro-     nothing unusual Psych:  No- change in mood or affect. No depression or anxiety.  No memory loss.  OBJ-  Physical Exam General- Alert, Oriented, Affect-appropriate, Distress- none acute, overweight Skin- rash-none, lesions- none, excoriation- none Lymphadenopathy- none Head- atraumatic            Eyes- Gross vision intact, PERRLA, conjunctivae and secretions clear            Ears- Hearing, canals-normal            Nose- Clear, no-Septal dev, mucus, polyps, erosion, perforation             Throat- Mallampati III-IV , mucosa+minimally dry , drainage- none, tonsils- atrophic Neck- flexible , trachea midline, no stridor , thyroid nl, carotid no bruit Chest - symmetrical excursion , unlabored           Heart/CV- RRR , no murmur , no gallop  , no rub, nl s1 s2                           - JVD- none , edema- none, stasis changes- none, varices- none           Lung- clear to P&A, wheeze- none, cough- none , dullness-none, rub- none           Chest wall-  Abd-  Br/ Gen/ Rectal- Not done, not indicated Extrem- cyanosis- none, clubbing, none, atrophy- none, strength- nl Neuro- grossly intact to observation

## 2015-12-24 DIAGNOSIS — G4733 Obstructive sleep apnea (adult) (pediatric): Secondary | ICD-10-CM | POA: Diagnosis not present

## 2016-01-01 ENCOUNTER — Other Ambulatory Visit: Payer: Self-pay | Admitting: Internal Medicine

## 2016-01-01 NOTE — Telephone Encounter (Signed)
Pt last had refill 05/27/15 #30 x 5 refills Sig: TAKE 1 TABLET BY MOUTH EVERY DAY AT BEDTIME AS NEEDED FOR SLEEP  Please advise Dr. Annamaria Boots thanks

## 2016-01-02 NOTE — Telephone Encounter (Signed)
Ok to refill 6 months 

## 2016-01-07 DIAGNOSIS — F5104 Psychophysiologic insomnia: Secondary | ICD-10-CM | POA: Diagnosis not present

## 2016-01-07 DIAGNOSIS — N400 Enlarged prostate without lower urinary tract symptoms: Secondary | ICD-10-CM | POA: Diagnosis not present

## 2016-01-07 DIAGNOSIS — Z1389 Encounter for screening for other disorder: Secondary | ICD-10-CM | POA: Diagnosis not present

## 2016-01-07 DIAGNOSIS — E7800 Pure hypercholesterolemia, unspecified: Secondary | ICD-10-CM | POA: Diagnosis not present

## 2016-01-07 DIAGNOSIS — I2510 Atherosclerotic heart disease of native coronary artery without angina pectoris: Secondary | ICD-10-CM | POA: Diagnosis not present

## 2016-01-07 DIAGNOSIS — I10 Essential (primary) hypertension: Secondary | ICD-10-CM | POA: Diagnosis not present

## 2016-01-20 DIAGNOSIS — Z85820 Personal history of malignant melanoma of skin: Secondary | ICD-10-CM | POA: Diagnosis not present

## 2016-01-20 DIAGNOSIS — Z1283 Encounter for screening for malignant neoplasm of skin: Secondary | ICD-10-CM | POA: Diagnosis not present

## 2016-01-20 DIAGNOSIS — L570 Actinic keratosis: Secondary | ICD-10-CM | POA: Diagnosis not present

## 2016-01-20 DIAGNOSIS — Z08 Encounter for follow-up examination after completed treatment for malignant neoplasm: Secondary | ICD-10-CM | POA: Diagnosis not present

## 2016-01-20 DIAGNOSIS — X32XXXD Exposure to sunlight, subsequent encounter: Secondary | ICD-10-CM | POA: Diagnosis not present

## 2016-03-29 DIAGNOSIS — R350 Frequency of micturition: Secondary | ICD-10-CM | POA: Diagnosis not present

## 2016-03-29 DIAGNOSIS — N401 Enlarged prostate with lower urinary tract symptoms: Secondary | ICD-10-CM | POA: Diagnosis not present

## 2016-03-29 DIAGNOSIS — R3915 Urgency of urination: Secondary | ICD-10-CM | POA: Diagnosis not present

## 2016-03-29 DIAGNOSIS — R3911 Hesitancy of micturition: Secondary | ICD-10-CM | POA: Diagnosis not present

## 2016-03-29 DIAGNOSIS — N528 Other male erectile dysfunction: Secondary | ICD-10-CM | POA: Diagnosis not present

## 2016-04-20 DIAGNOSIS — L570 Actinic keratosis: Secondary | ICD-10-CM | POA: Diagnosis not present

## 2016-04-20 DIAGNOSIS — Z08 Encounter for follow-up examination after completed treatment for malignant neoplasm: Secondary | ICD-10-CM | POA: Diagnosis not present

## 2016-04-20 DIAGNOSIS — D225 Melanocytic nevi of trunk: Secondary | ICD-10-CM | POA: Diagnosis not present

## 2016-04-20 DIAGNOSIS — D0361 Melanoma in situ of right upper limb, including shoulder: Secondary | ICD-10-CM | POA: Diagnosis not present

## 2016-04-20 DIAGNOSIS — Z1283 Encounter for screening for malignant neoplasm of skin: Secondary | ICD-10-CM | POA: Diagnosis not present

## 2016-04-20 DIAGNOSIS — Z85828 Personal history of other malignant neoplasm of skin: Secondary | ICD-10-CM | POA: Diagnosis not present

## 2016-04-27 DIAGNOSIS — L98499 Non-pressure chronic ulcer of skin of other sites with unspecified severity: Secondary | ICD-10-CM | POA: Diagnosis not present

## 2016-04-27 DIAGNOSIS — X32XXXD Exposure to sunlight, subsequent encounter: Secondary | ICD-10-CM | POA: Diagnosis not present

## 2016-04-27 DIAGNOSIS — D0361 Melanoma in situ of right upper limb, including shoulder: Secondary | ICD-10-CM | POA: Diagnosis not present

## 2016-04-27 DIAGNOSIS — L570 Actinic keratosis: Secondary | ICD-10-CM | POA: Diagnosis not present

## 2016-05-18 DIAGNOSIS — M170 Bilateral primary osteoarthritis of knee: Secondary | ICD-10-CM | POA: Diagnosis not present

## 2016-07-15 ENCOUNTER — Other Ambulatory Visit: Payer: Self-pay | Admitting: Internal Medicine

## 2016-07-15 NOTE — Telephone Encounter (Signed)
Pt last had refill on triazolam 0.25 mg on 01/05/16 #30 x 5 refills Please advise Dr. Annamaria Boots thanks

## 2016-07-15 NOTE — Telephone Encounter (Signed)
Ok to refill 6 months 

## 2016-07-27 DIAGNOSIS — Z85820 Personal history of malignant melanoma of skin: Secondary | ICD-10-CM | POA: Diagnosis not present

## 2016-07-27 DIAGNOSIS — L821 Other seborrheic keratosis: Secondary | ICD-10-CM | POA: Diagnosis not present

## 2016-07-27 DIAGNOSIS — L570 Actinic keratosis: Secondary | ICD-10-CM | POA: Diagnosis not present

## 2016-07-27 DIAGNOSIS — Z08 Encounter for follow-up examination after completed treatment for malignant neoplasm: Secondary | ICD-10-CM | POA: Diagnosis not present

## 2016-07-27 DIAGNOSIS — Z1283 Encounter for screening for malignant neoplasm of skin: Secondary | ICD-10-CM | POA: Diagnosis not present

## 2016-07-27 DIAGNOSIS — X32XXXD Exposure to sunlight, subsequent encounter: Secondary | ICD-10-CM | POA: Diagnosis not present

## 2016-08-04 DIAGNOSIS — M170 Bilateral primary osteoarthritis of knee: Secondary | ICD-10-CM | POA: Diagnosis not present

## 2016-08-10 ENCOUNTER — Telehealth: Payer: Self-pay | Admitting: Cardiology

## 2016-08-10 NOTE — Telephone Encounter (Signed)
Calling to check on a fax that was sent last Thursday from Dr. Alvan Dame office for a Cardiac Clearance for Dr. Martinique   Thanks

## 2016-08-10 NOTE — Telephone Encounter (Signed)
Returned call to patient.He stated he was calling to ask if we received a surgical clearance from Dr.Olin's office for upcoming knee surgery.Advised I'am working at [employer].I will check Dr.Jordan's box 08/11/16 and call you back.

## 2016-08-11 NOTE — Telephone Encounter (Signed)
Returned call to patient, spoke to [place] he advised needs to be seen for surgical clearance.Appointment scheduled with Truitt Merle NP 08/16/16 at 8:00 am at [facility] office.Surgical clearance form from Dr.Olin's office faxed to [facility] at fax # [phone].

## 2016-08-13 ENCOUNTER — Encounter: Payer: Self-pay | Admitting: Nurse Practitioner

## 2016-08-16 ENCOUNTER — Encounter: Payer: Self-pay | Admitting: Nurse Practitioner

## 2016-08-16 ENCOUNTER — Ambulatory Visit (INDEPENDENT_AMBULATORY_CARE_PROVIDER_SITE_OTHER): Payer: PPO | Admitting: Nurse Practitioner

## 2016-08-16 ENCOUNTER — Telehealth: Payer: Self-pay | Admitting: *Deleted

## 2016-08-16 VITALS — BP 138/80 | HR 58 | Ht 65.0 in | Wt 178.1 lb

## 2016-08-16 DIAGNOSIS — I10 Essential (primary) hypertension: Secondary | ICD-10-CM | POA: Diagnosis not present

## 2016-08-16 DIAGNOSIS — E785 Hyperlipidemia, unspecified: Secondary | ICD-10-CM | POA: Diagnosis not present

## 2016-08-16 DIAGNOSIS — I2510 Atherosclerotic heart disease of native coronary artery without angina pectoris: Secondary | ICD-10-CM

## 2016-08-16 DIAGNOSIS — Z01818 Encounter for other preprocedural examination: Secondary | ICD-10-CM

## 2016-08-16 NOTE — Telephone Encounter (Signed)
Faxing to [employer] @ [facility] @ [phone].Pt has been cleared for surgery for R Knee scope.

## 2016-08-16 NOTE — Progress Notes (Signed)
CARDIOLOGY OFFICE NOTE  Date:  08/16/2016    Scott [name] Date of Birth: 15-Nov-1928 Medical Record #[id number]  PCP:  Lillia [place], MD  Cardiologist:  Abdel Effinger & [place]   Chief Complaint  Patient presents with  . Pre-op Exam  . Coronary Artery Disease    Pre op visit - seen for Dr. [place]    History of Present Illness: Scott [name] is a 82 y.o. male who presents today for a pre op/surgical clearance visit. Seen for Dr. [place]. Former patient of Dr. Ronnald Nian.   He has known CAD with remote CABG back in 2007 per [facility]. Negative stress Myoview in 2010. Other issues include obesity, HTN, HLD and OSA. Last echo in 2013.    Last seen back in October and he was doing well.   Comes in today. Here with his wife. Needing to have his right knee scoped. It has limited his mobility but he remains active on his farm. No chest pain. Breathing is ok as long as he paces himself. Can walk up a flight of stairs. Can walk around the block. BP is good. Feels good on his medicines. He is happy with how he is doing. Has gotten a later summer cold.   Past Medical History:  Diagnosis Date  . Anemia    Acute blood loss anemia secondary to surgery, followed fr/ Hip replacement   . Anxiety    pt. reports that he has occas. nervous episodes   . BPH (benign prostatic hyperplasia)   . CAD (coronary artery disease)    s/p CABG in 2007  CARDILOLOGIST IS DR. P. [place]  . Cancer (HCC)    melanoma - removed L ear - 2010  . Cranial neuropathy    resolved   . Generalized OA   . GERD (gastroesophageal reflux disease)   . H/O echocardiogram    last echo, stress test 05/2012- pt. followed by [place]   . Hypercholesterolemia   . Hypertension   . Obesity   . Osteoarthritis    End-stage osteoarthritis, right hip  . Sleep apnea    with use of CPAP  . SOB (shortness of breath)    WITH EXERTION  . Thrombocytopenia (HCC)    possibly due to Lovenox    Past Surgical History:  Procedure  Laterality Date  . ACHILLES TENDON SURGERY  10/06/2012   Procedure: ACHILLES TENDON REPAIR;  Surgeon: Thera Flake., MD;  Location: [facility] OR;  Service: Orthopedics;  Laterality: Right;  REPAIR RIGHT RUPTURE ACHILLES TENDON PRIMARY OPEN/PERCUTANEOUS, EXCISION PARTIAL BONE TALUS/CANCANEUS, REPAIR PARTIAL EXCISION CALCANEUS  . APPENDECTOMY    . ARTERY BIOPSY     temporal - L side- wnl, double vision treated /w prednisone - 1991  . CALCANEAL OSTEOTOMY  10/06/2012   Procedure: CALCANEAL OSTEOTOMY;  Surgeon: Thera Flake., MD;  Location: [facility] OR;  Service: Orthopedics;  Laterality: Right;  . CARDIAC CATHETERIZATION  12/15/2005   NORMAL. EF 60%, angioplasty 1995  . carpel tunnel surgery-bilateral     both hands   . CORONARY ARTERY BYPASS GRAFT  2007   LIMA GRAFT TO THE LAD, SAPHENOUS VEIN GRAFT TO THE FIRST OBTUSE MARIGNAL, SAPHENOUS VEIN GRAFT TO THE SECOND OBTUSE MARGINAL AND A SAPHENOUS VEIN GRAFT TO THE ACUTE MARGINAL AND POSTERIOR LATERAL BRANCH  . HERNIA REPAIR    . I&D EXTREMITY  12/11/2012   Procedure: IRRIGATION AND DEBRIDEMENT EXTREMITY;  Surgeon: Nadara Mustard, MD;  Location: [facility] OR;  Service: Orthopedics;  Laterality: Right;  Irrigation and Debridement achilles, wound closure, apply wound vac, place antibiotic beads  . INGUINAL HERNIA REPAIR Left 05/10/2013   Procedure: HERNIA REPAIR INGUINAL ADULT;  Surgeon: Velora Heckler, MD;  Location: [facility] ORS;  Service: General;  Laterality: Left;  . INSERTION OF MESH Left 05/10/2013   Procedure: INSERTION OF MESH;  Surgeon: Velora Heckler, MD;  Location: [facility] ORS;  Service: General;  Laterality: Left;  . JOINT REPLACEMENT    . knee  11/12   left arthroscopic  . TOTAL HIP ARTHROPLASTY     right     Medications: Current Outpatient Prescriptions  Medication Sig Dispense Refill  . aspirin 81 MG tablet Take 81 mg by mouth every evening.     . beta carotene w/minerals (OCUVITE) tablet Take 1 tablet by mouth every morning.    . bisacodyl (DULCOLAX) 5 MG EC  tablet Take 5 mg by mouth daily as needed for constipation.    . calcium citrate (CALCITRATE - DOSED IN MG ELEMENTAL CALCIUM) 950 MG tablet Take 1 tablet by mouth every morning.    . clobetasol (TEMOVATE) 0.05 % external solution     . finasteride (PROSCAR) 5 MG tablet Take 5 mg by mouth daily before breakfast.     . fish oil-omega-3 fatty acids 1000 MG capsule Take 1 g by mouth every other day.     . lovastatin (MEVACOR) 20 MG tablet Take 20 mg by mouth at bedtime.     . metoprolol (LOPRESSOR) 50 MG tablet Take 25 mg by mouth 2 (two) times daily.    . potassium chloride (K-DUR,KLOR-CON) 10 MEQ tablet Take 10 mEq by mouth every evening.     . ranitidine (ZANTAC) 75 MG tablet Take 75 mg by mouth daily before breakfast.    . Tamsulosin HCl (FLOMAX) 0.4 MG CAPS Take 0.4 mg by mouth daily after supper.    . temazepam (RESTORIL) 30 MG capsule Take 1 capsule (30 mg total) by mouth at bedtime as needed for sleep. 30 capsule 5  . tetrahydrozoline 0.05 % ophthalmic solution Place 1 drop into both eyes daily as needed (for dry eyes).    . traZODone (DESYREL) 50 MG tablet Take 1 tablet (50 mg total) by mouth at bedtime. 30 tablet prn  . triamterene-hydrochlorothiazide (MAXZIDE) 75-50 MG per tablet Take 1 tablet by mouth daily before breakfast.     . triazolam (HALCION) 0.25 MG tablet TAKE 1 TABLET BY MOUTH EVERY NIGHT AT BEDTIME FOR SLEEP 30 tablet 5  . vitamin E 400 UNIT capsule Take 400 Units by mouth as needed.    . zolpidem (AMBIEN) 5 MG tablet 1 at bedtime for sleep if needed 30 tablet 5   No current facility-administered medications for this visit.     Allergies: Allergies  Allergen Reactions  . Acetaminophen     Other reaction(s): mouth sores  . Amlodipine Swelling    Swelling of feet and legs  . Bactrim [Sulfamethoxazole-Trimethoprim] Other (See Comments)    Constipation   . Codeine Nausea Only  . Fexofenadine     Other reaction(s): mouth sores  . Ibuprofen     Other reaction(s):  nervousness  . Lovenox [Enoxaparin Sodium] Other (See Comments)    MAY HAVE CAUSED THROMBOCYTOPENIA  . Naproxen     Other reaction(s): mouth sores  . Prednisone Swelling    Patient has tolerated doses for colds. Thinks it caused swelling in the past  . Shellfish Allergy Nausea And Vomiting and Other (See Comments)  N&V, shaking  . Sonata [Zaleplon] Other (See Comments)    Hyper feelings    Social History: The patient  reports that he has never smoked. He has never used smokeless tobacco. He reports that he does not drink alcohol or use drugs.   Family History: The patient's family history includes Heart disease in his brother and brother; Prostate cancer in his brother.   Review of Systems: Please see the history of present illness.   Otherwise, the review of systems is positive for none.   All other systems are reviewed and negative.   Physical Exam: VS:  BP 138/80   Pulse (!) 58   Ht 5\' 5"  (1.651 m)   Wt 178 lb 1.9 oz (80.8 kg)   BMI 29.64 kg/m  .  BMI Body mass index is 29.64 kg/m.  Wt Readings from Last 3 Encounters:  08/16/16 178 lb 1.9 oz (80.8 kg)  12/04/15 186 lb 3.2 oz (84.5 kg)  09/11/15 180 lb 6.4 oz (81.8 kg)    General: Pleasant. Looks younger than his stated age. He is alert and in no acute distress.   HEENT: Normal.  Neck: Supple, no JVD, carotid bruits, or masses noted.  Cardiac:Regular rate and rhythm. No murmurs, rubs, or gallops. No edema.  Respiratory:  Lungs are clear to auscultation bilaterally with normal work of breathing.  GI: Soft and nontender.  MS: No deformity or atrophy. Gait and ROM intact.  Skin: Warm and dry. Color is normal.  Neuro:  Strength and sensation are intact and no gross focal deficits noted.  Psych: Alert, appropriate and with normal affect.   LABORATORY DATA:  EKG:  EKG is ordered today. This demonstrates sinus bradycardia with nonspecific changes - unchanged.  Lab Results  Component Value Date   WBC 8.9 05/13/2013    HGB 13.5 05/13/2013   HCT 39.5 05/13/2013   PLT PLATELETS APPEAR ADEQUATE 05/13/2013   GLUCOSE 97 05/13/2013   CHOL 152 05/17/2011   TRIG 148.0 05/17/2011   HDL 40.30 05/17/2011   LDLCALC 82 05/17/2011   ALT 13 05/12/2013   AST 18 05/12/2013   NA 138 05/13/2013   K 3.4 (L) 05/13/2013   CL 98 05/13/2013   CREATININE 0.86 05/13/2013   BUN 9 05/13/2013   CO2 33 (H) 05/13/2013   INR 0.97 12/11/2012    BNP (last 3 results) No results for input(s): BNP in the last 8760 hours.  ProBNP (last 3 results) No results for input(s): PROBNP in the last 8760 hours.   Other Studies Reviewed Today:  Echo Study Conclusions from 2013  - Left ventricle: The cavity size was normal. There was mild focal basal hypertrophy of the septum. Systolic function was normal. The estimated ejection fraction was in the range of 55% to 65%. Wall motion was normal; there were no regional wall motion abnormalities. Doppler parameters are consistent with abnormal left ventricular relaxation (grade 1 diastolic dysfunction). - Aortic valve: Trivial regurgitation. - Mitral valve: Mild regurgitation. - Pulmonary arteries: Systolic pressure was mildly increased. PA peak pressure: 41mm Hg (S).  Assessment/Plan: 1. Pre op clearance - he should be a satisfactory candidate for his knee surgery. Will be available as needed. Can hold his aspirin for 5 days.   2. CAD with prior CABG back in 2007 - no active symptoms. Continues to do well.   3. HTN - BP looks good on current therapy.   4. HLD - on statin therapy  Current medicines are reviewed with the patient today.  The patient does not have concerns regarding medicines other than what has been noted above.  The following changes have been made:  See above.  Labs/ tests ordered today include:    Orders Placed This Encounter  Procedures  . EKG 12-Lead     Disposition:   Follow up with Dr. [place] in December as planned.    Patient is  agreeable to this plan and will call if any problems develop in the interim.   Signed: Rosalio Macadamia, RN, ANP-C 08/16/2016 8:19 AM  [facility] [address] [place], [place]  [address] Phone: [phone] Fax: [phone]

## 2016-08-16 NOTE — Patient Instructions (Addendum)
We will be checking the following labs today - NONE   Medication Instructions:    Continue with your current medicines.     Testing/Procedures To Be Arranged:  N/A  Follow-Up:   See Dr. Martinique as planned in December    Other Special Instructions:   I will send a note to Dr. Alvan Dame - you may proceed with your knee surgery.     If you need a refill on your cardiac medications before your next appointment, please call your pharmacy.   Call the [place] office at [phone] if you have any questions, problems or concerns.

## 2016-09-10 DIAGNOSIS — E876 Hypokalemia: Secondary | ICD-10-CM | POA: Diagnosis not present

## 2016-09-13 ENCOUNTER — Ambulatory Visit: Payer: Commercial Managed Care - HMO | Admitting: Cardiology

## 2016-09-29 DIAGNOSIS — M170 Bilateral primary osteoarthritis of knee: Secondary | ICD-10-CM | POA: Diagnosis not present

## 2016-10-15 DIAGNOSIS — M1712 Unilateral primary osteoarthritis, left knee: Secondary | ICD-10-CM | POA: Diagnosis not present

## 2016-10-15 DIAGNOSIS — G8918 Other acute postprocedural pain: Secondary | ICD-10-CM | POA: Diagnosis not present

## 2016-10-15 DIAGNOSIS — M2341 Loose body in knee, right knee: Secondary | ICD-10-CM | POA: Diagnosis not present

## 2016-10-15 DIAGNOSIS — M23341 Other meniscus derangements, anterior horn of lateral meniscus, right knee: Secondary | ICD-10-CM | POA: Diagnosis not present

## 2016-10-15 DIAGNOSIS — M23241 Derangement of anterior horn of lateral meniscus due to old tear or injury, right knee: Secondary | ICD-10-CM | POA: Diagnosis not present

## 2016-10-15 DIAGNOSIS — M94261 Chondromalacia, right knee: Secondary | ICD-10-CM | POA: Diagnosis not present

## 2016-10-15 DIAGNOSIS — M23221 Derangement of posterior horn of medial meniscus due to old tear or injury, right knee: Secondary | ICD-10-CM | POA: Diagnosis not present

## 2016-10-15 DIAGNOSIS — M23321 Other meniscus derangements, posterior horn of medial meniscus, right knee: Secondary | ICD-10-CM | POA: Diagnosis not present

## 2016-10-26 DIAGNOSIS — Z1283 Encounter for screening for malignant neoplasm of skin: Secondary | ICD-10-CM | POA: Diagnosis not present

## 2016-10-26 DIAGNOSIS — Z85820 Personal history of malignant melanoma of skin: Secondary | ICD-10-CM | POA: Diagnosis not present

## 2016-10-26 DIAGNOSIS — X32XXXD Exposure to sunlight, subsequent encounter: Secondary | ICD-10-CM | POA: Diagnosis not present

## 2016-10-26 DIAGNOSIS — L570 Actinic keratosis: Secondary | ICD-10-CM | POA: Diagnosis not present

## 2016-10-26 DIAGNOSIS — Z08 Encounter for follow-up examination after completed treatment for malignant neoplasm: Secondary | ICD-10-CM | POA: Diagnosis not present

## 2016-11-02 ENCOUNTER — Encounter: Payer: Self-pay | Admitting: Internal Medicine

## 2016-11-03 ENCOUNTER — Ambulatory Visit (INDEPENDENT_AMBULATORY_CARE_PROVIDER_SITE_OTHER): Payer: PPO | Admitting: Internal Medicine

## 2016-11-03 ENCOUNTER — Encounter: Payer: Self-pay | Admitting: Internal Medicine

## 2016-11-03 VITALS — BP 130/80 | HR 53 | Ht 65.0 in | Wt 177.0 lb

## 2016-11-03 DIAGNOSIS — J209 Acute bronchitis, unspecified: Secondary | ICD-10-CM | POA: Diagnosis not present

## 2016-11-03 DIAGNOSIS — G4733 Obstructive sleep apnea (adult) (pediatric): Secondary | ICD-10-CM | POA: Diagnosis not present

## 2016-11-03 NOTE — Progress Notes (Signed)
HPI  M never smoker followed for OSA, insomnia, rhinitis, complicated by CAD/CABG  12/03/14- 82 yo M never smoker followed for OSA/ [facility]- CPAP, Insomnia, Rhinitis, complicated by CAD/ CABG.  wife here CPAP 10/ [facility] FOLLOWS FOR: wears cpap 6-8 hours nightly, no complaints with mask/supplies/pressure.  no other complaints.   Temazepam 30 mg last about 3 hours and is associated with unpleasant vivid dreams. Prefers Halcion and just uses temazepam for a few days occasionally to prevent developing tolerance to Halcion. Asks about alternatives.  12/04/2015-82 year old male never smoker followed for OSA/[facility]-CPAP, insomnia, rhinitis, complicated by CAD/CABG CPAP 10/[facility] FOLLOWS FOR: pt. states he wears CPAP 7hr. everynight. pressure is good. supplies needed (mask). [place]  11/03/2016-82 year old male never smoker followed for OSA, insomnia, rhinitis, complicated by CAD/CABG CPAP 10/[facility] FOLLOWS FOR: Currently using CPAP every night for 6-7 hours. Denies issues with machine, mask or pressure. DME is [facility]. He and his wife had a recent head cold now resolving and he had knee surgery. CPAP mask is getting old and leaks. We think the machine itself may be old enough to need replacing. Pressure seems okay by his description. He finds mask leak bothersome issue but download shows good compliance and control.  ROS-see HPI Constitutional:   No-   weight loss, night sweats, fevers, chills, +fatigue, lassitude. HEENT:   No-  headaches, difficulty swallowing, tooth/dental problems, sore throat,       No-  sneezing, itching, ear ache, +nasal congestion, post nasal drip,  CV:  No-   chest pain, orthopnea, PND, +swelling in lower extremities, No-anasarca, dizziness, palpitations Resp: +  shortness of breath with exertion or at rest.              No-   productive cough,  No non-productive cough,  No- coughing up of blood.              No-   change in color of mucus.  No- wheezing.   Skin: No-   rash or  lesions. GI:  No-   heartburn, indigestion, abdominal pain, nausea, vomiting,  GU: n. MS:  No-   joint pain or swelling.   Neuro-     nothing unusual Psych:  No- change in mood or affect. No depression or anxiety.  No memory loss.  OBJ- Physical Exam General- Alert, Oriented, Affect-appropriate, Distress- none acute, +overweight Skin- rash-none, lesions- none, excoriation- none Lymphadenopathy- none Head- atraumatic            Eyes- Gross vision intact, PERRLA, conjunctivae and secretions clear            Ears- Hearing, canals-normal            Nose- Clear, no-Septal dev, mucus, polyps, erosion, perforation             Throat- Mallampati III-IV , mucosa clear , drainage- none, tonsils- atrophic Neck- flexible , trachea midline, no stridor , thyroid nl, carotid no bruit Chest - symmetrical excursion , unlabored           Heart/CV- RRR , no murmur , no gallop  , no rub, nl s1 s2                           - JVD- none , edema- none, stasis changes- none, varices- none           Lung- clear to P&A, wheeze- none, cough- none , dullness-none, rub- none  Chest wall-  Abd-  Br/ Gen/ Rectal- Not done, not indicated Extrem- cyanosis- none, clubbing, none, atrophy- none, strength- nl Neuro- grossly intact to observation

## 2016-11-03 NOTE — Assessment & Plan Note (Signed)
Download confirms good compliance and control. He has issues with mask leak. Machine is old enough to deserve replacement and we can take the opportunity to change to auto Pap which may reduce his mean pressure and reduce leak

## 2016-11-03 NOTE — Assessment & Plan Note (Signed)
Resolving acute upper respiratory infection with acute bronchitis. I don't think he needs treatment from [place] for this today

## 2016-11-03 NOTE — Patient Instructions (Signed)
Order- [facility] please evaluate eligibility for replacement of old CPAP machine. Change to auto 4 - 15, mask of choice, humidifier, supplies. Patient needs mask, gasket, filter, humidifier for current machine now.     Dx OSA  Please call as needed

## 2016-11-10 DIAGNOSIS — Z1389 Encounter for screening for other disorder: Secondary | ICD-10-CM | POA: Diagnosis not present

## 2016-11-10 DIAGNOSIS — I10 Essential (primary) hypertension: Secondary | ICD-10-CM | POA: Diagnosis not present

## 2016-11-10 DIAGNOSIS — Z0000 Encounter for general adult medical examination without abnormal findings: Secondary | ICD-10-CM | POA: Diagnosis not present

## 2016-11-10 DIAGNOSIS — E7800 Pure hypercholesterolemia, unspecified: Secondary | ICD-10-CM | POA: Diagnosis not present

## 2016-11-10 NOTE — Progress Notes (Signed)
CARDIOLOGY OFFICE NOTE  Date:  11/12/2016    [name] Date of Birth: [birth year] Medical Record #[id number]  PCP:  Lillia Mountain, MD  Cardiologist:  Peter Swaziland MD  Chief Complaint  Patient presents with  . Follow-up  . Leg Pain    cramping in legs.  . Coronary Artery Disease    History of Present Illness: Scott [name] is a 82 y.o. male who presents today for follow up CAD.  He has known CAD with remote CABG back in 2007 by  Dr. Tyrone Sage. Negative stress Myoview in 2010. Other issues include obesity, HTN, HLD and OSA. Last echo in 2013.    Since his last visit he did have arthroscopic knee surgery. He has recovered well from this.  No chest pain. Breathing is ok. Stays very active.  BP is good. Feels good on his medicines. He is happy with how he is doing.  Past Medical History:  Diagnosis Date  . Anemia    Acute blood loss anemia secondary to surgery, followed fr/ Hip replacement   . Anxiety    pt. reports that he has occas. nervous episodes   . BPH (benign prostatic hyperplasia)   . CAD (coronary artery disease)    s/p CABG in 2007  CARDILOLOGIST IS DR. P. Sw[place]iland  . Cancer (HCC)    melanoma - removed L ear - 2010  . Cranial neuropathy    resolved   . Generalized OA   . GERD (gastroesophageal reflux disease)   . H/O echocardiogram    last echo, stress test 05/2012- pt. followed by [place]   . Hypercholesterolemia   . Hypertension   . Obesity   . Osteoarthritis    End-stage osteoarthritis, right hip  . Sleep apnea    with use of CPAP  . SOB (shortness of breath)    WITH EXERTION  . Thrombocytopenia (HCC)    possibly due to Lovenox    Past Surgical History:  Procedure Laterality Date  . ACHILLES TENDON SURGERY  10/06/2012   Procedure: ACHILLES TENDON REPAIR;  Surgeon: Thera Flake., MD;  Location: [facility] OR;  Service: Orthopedics;  Laterality: Right;  REPAIR RIGHT RUPTURE ACHILLES TENDON PRIMARY OPEN/PERCUTANEOUS, EXCISION PARTIAL BONE  TALUS/CANCANEUS, REPAIR PARTIAL EXCISION CALCANEUS  . APPENDECTOMY    . ARTERY BIOPSY     temporal - L side- wnl, double vision treated /w prednisone - 1991  . CALCANEAL OSTEOTOMY  10/06/2012   Procedure: CALCANEAL OSTEOTOMY;  Surgeon: Thera Flake., MD;  Location: [facility] OR;  Service: Orthopedics;  Laterality: Right;  . CARDIAC CATHETERIZATION  12/15/2005   NORMAL. EF 60%, angioplasty 1995  . carpel tunnel surgery-bilateral     both hands   . CORONARY ARTERY BYPASS GRAFT  2007   LIMA GRAFT TO THE LAD, SAPHENOUS VEIN GRAFT TO THE FIRST OBTUSE MARIGNAL, SAPHENOUS VEIN GRAFT TO THE SECOND OBTUSE MARGINAL AND A SAPHENOUS VEIN GRAFT TO THE ACUTE MARGINAL AND POSTERIOR LATERAL BRANCH  . HERNIA REPAIR    . I&D EXTREMITY  12/11/2012   Procedure: IRRIGATION AND DEBRIDEMENT EXTREMITY;  Surgeon: Nadara Mustard, MD;  Location: [facility] OR;  Service: Orthopedics;  Laterality: Right;  Irrigation and Debridement achilles, wound closure, apply wound vac, place antibiotic beads  . INGUINAL HERNIA REPAIR Left 05/10/2013   Procedure: HERNIA REPAIR INGUINAL ADULT;  Surgeon: Velora Heckler, MD;  Location: [facility] ORS;  Service: General;  Laterality: Left;  . INSERTION OF MESH Left 05/10/2013   Procedure:  INSERTION OF MESH;  Surgeon: Velora Heckler, MD;  Location: [facility] ORS;  Service: General;  Laterality: Left;  . JOINT REPLACEMENT    . knee  11/12   left arthroscopic  . TOTAL HIP ARTHROPLASTY     right     Medications: Current Outpatient Prescriptions  Medication Sig Dispense Refill  . aspirin 81 MG tablet Take 81 mg by mouth every evening.     . beta carotene w/minerals (OCUVITE) tablet Take 1 tablet by mouth every morning.    . bisacodyl (DULCOLAX) 5 MG EC tablet Take 5 mg by mouth daily as needed for constipation.    . calcium citrate (CALCITRATE - DOSED IN MG ELEMENTAL CALCIUM) 950 MG tablet Take 1 tablet by mouth every morning.    . clobetasol (TEMOVATE) 0.05 % external solution     . finasteride (PROSCAR) 5 MG tablet  Take 5 mg by mouth daily before breakfast.     . fish oil-omega-3 fatty acids 1000 MG capsule Take 1 g by mouth every other day.     . lovastatin (MEVACOR) 20 MG tablet Take 20 mg by mouth at bedtime.     . metoprolol (LOPRESSOR) 50 MG tablet Take 25 mg by mouth 2 (two) times daily.    . potassium chloride (K-DUR,KLOR-CON) 10 MEQ tablet Take 10 mEq by mouth every evening.     . ranitidine (ZANTAC) 75 MG tablet Take 75 mg by mouth daily before breakfast.    . Tamsulosin HCl (FLOMAX) 0.4 MG CAPS Take 0.4 mg by mouth daily after supper.    . temazepam (RESTORIL) 30 MG capsule Take 1 capsule (30 mg total) by mouth at bedtime as needed for sleep. 30 capsule 5  . tetrahydrozoline 0.05 % ophthalmic solution Place 1 drop into both eyes daily as needed (for dry eyes).    . traZODone (DESYREL) 50 MG tablet Take 1 tablet (50 mg total) by mouth at bedtime. 30 tablet prn  . triamterene-hydrochlorothiazide (MAXZIDE) 75-50 MG per tablet Take 1 tablet by mouth daily before breakfast.     . triazolam (HALCION) 0.25 MG tablet TAKE 1 TABLET BY MOUTH EVERY NIGHT AT BEDTIME FOR SLEEP 30 tablet 5  . vitamin E 400 UNIT capsule Take 400 Units by mouth as needed.    . zolpidem (AMBIEN) 5 MG tablet 1 at bedtime for sleep if needed 30 tablet 5   No current facility-administered medications for this visit.     Allergies: Allergies  Allergen Reactions  . Acetaminophen     Other reaction(s): mouth sores  . Amlodipine Swelling    Swelling of feet and legs  . Bactrim [Sulfamethoxazole-Trimethoprim] Other (See Comments)    Constipation   . Codeine Nausea Only  . Fexofenadine     Other reaction(s): mouth sores  . Ibuprofen     Other reaction(s): nervousness  . Lovenox [Enoxaparin Sodium] Other (See Comments)    MAY HAVE CAUSED THROMBOCYTOPENIA  . Naproxen     Other reaction(s): mouth sores  . Prednisone Swelling    Patient has tolerated doses for colds. Thinks it caused swelling in the past  . Shellfish Allergy  Nausea And Vomiting and Other (See Comments)    N&V, shaking  . Sonata [Zaleplon] Other (See Comments)    Hyper feelings    Social History: The patient  reports that he has never smoked. He has never used smokeless tobacco. He reports that he does not drink alcohol or use drugs.   Family History: The patient's family  history includes Heart disease in his brother and brother; Prostate cancer in his brother.   Review of Systems: Please see the history of present illness.   Otherwise, the review of systems is positive for none.   All other systems are reviewed and negative.   Physical Exam: VS:  BP (!) 141/72   Pulse 68   Ht 5\' 5"  (1.651 m)   Wt 171 lb 12.8 oz (77.9 kg)   BMI 28.59 kg/m  .  BMI Body mass index is 28.59 kg/m.  Wt Readings from Last 3 Encounters:  11/12/16 171 lb 12.8 oz (77.9 kg)  11/03/16 177 lb (80.3 kg)  08/16/16 178 lb 1.9 oz (80.8 kg)    General: Pleasant. Looks younger than his stated age. He is alert and in no acute distress.   HEENT: Normal.  Neck: Supple, no JVD, carotid bruits, or masses noted.  Cardiac:Regular rate and rhythm. No murmurs, rubs, or gallops. No edema.  Respiratory:  Lungs are clear to auscultation bilaterally with normal work of breathing.  GI: Soft and nontender.  MS: No deformity or atrophy. Gait and ROM intact.  Skin: Warm and dry. Color is normal.  Neuro:  Strength and sensation are intact and no gross focal deficits noted.  Psych: Alert, appropriate and with normal affect.   LABORATORY DATA:  EKG:  EKG is not ordered today.   Lab Results  Component Value Date   WBC 8.9 05/13/2013   HGB 13.5 05/13/2013   HCT 39.5 05/13/2013   PLT PLATELETS APPEAR ADEQUATE 05/13/2013   GLUCOSE 97 05/13/2013   CHOL 152 05/17/2011   TRIG 148.0 05/17/2011   HDL 40.30 05/17/2011   LDLCALC 82 05/17/2011   ALT 13 05/12/2013   AST 18 05/12/2013   NA 138 05/13/2013   K 3.4 (L) 05/13/2013   CL 98 05/13/2013   CREATININE 0.86 05/13/2013    BUN 9 05/13/2013   CO2 33 (H) 05/13/2013   INR 0.97 12/11/2012    BNP (last 3 results) No results for input(s): BNP in the last 8760 hours.  ProBNP (last 3 results) No results for input(s): PROBNP in the last 8760 hours.   Other Studies Reviewed Today:  Labs from primary care: 01/07/16: cholesterol 141, triglycerides 144, LDL 65, HDL 47.  09/10/16: BMET normal.  Echo Study Conclusions from 2013  - Left ventricle: The cavity size was normal. There was mild focal basal hypertrophy of the septum. Systolic function was normal. The estimated ejection fraction was in the range of 55% to 65%. Wall motion was normal; there were no regional wall motion abnormalities. Doppler parameters are consistent with abnormal left ventricular relaxation (grade 1 diastolic dysfunction). - Aortic valve: Trivial regurgitation. - Mitral valve: Mild regurgitation. - Pulmonary arteries: Systolic pressure was mildly increased. PA peak pressure: 41mm Hg (S).  Assessment/Plan: 1.  CAD with prior CABG back in 2007 - no active symptoms. Continues to do well. Continue medical therapy.  3. HTN - BP looks good on current therapy.   4. HLD - on statin therapy  Current medicines are reviewed with the patient today.  The patient does not have concerns regarding medicines other than what has been noted above.  The following changes have been made:  See above.  Labs/ tests ordered today include:    No orders of the defined types were placed in this encounter.    Disposition:   Follow up with Dr. Swaziland in one year   Patient is agreeable to this plan and  will call if any problems develop in the interim.   Signed: Peter Swaziland MD, [facility]  11/12/2016 9:29 AM   [facility]

## 2016-11-12 ENCOUNTER — Ambulatory Visit (INDEPENDENT_AMBULATORY_CARE_PROVIDER_SITE_OTHER): Payer: PPO | Admitting: Cardiology

## 2016-11-12 ENCOUNTER — Other Ambulatory Visit: Payer: Self-pay

## 2016-11-12 VITALS — BP 141/72 | HR 68 | Ht 65.0 in | Wt 171.8 lb

## 2016-11-12 DIAGNOSIS — I2510 Atherosclerotic heart disease of native coronary artery without angina pectoris: Secondary | ICD-10-CM

## 2016-11-12 DIAGNOSIS — E7800 Pure hypercholesterolemia, unspecified: Secondary | ICD-10-CM | POA: Diagnosis not present

## 2016-11-12 DIAGNOSIS — I10 Essential (primary) hypertension: Secondary | ICD-10-CM

## 2016-11-12 NOTE — Patient Instructions (Addendum)
Continue your current therapy  I will see you in one year   

## 2016-11-23 DIAGNOSIS — J0100 Acute maxillary sinusitis, unspecified: Secondary | ICD-10-CM | POA: Diagnosis not present

## 2016-12-03 ENCOUNTER — Ambulatory Visit: Payer: Commercial Managed Care - HMO | Admitting: Internal Medicine

## 2017-01-09 ENCOUNTER — Other Ambulatory Visit: Payer: Self-pay | Admitting: Internal Medicine

## 2017-01-10 NOTE — Telephone Encounter (Signed)
Pt is requesting a refill on his triazolam 0.25mg  tablets. Pt was last seen in office on 11/03/16. Last refill was on 07/16/16 for 30 tablets and 5 refills.   Dr. Annamaria Boots, is it ok for the patient to have another refill? Thanks!

## 2017-01-10 NOTE — Telephone Encounter (Signed)
Ok to refill 6 months 

## 2017-01-25 DIAGNOSIS — Z85820 Personal history of malignant melanoma of skin: Secondary | ICD-10-CM | POA: Diagnosis not present

## 2017-01-25 DIAGNOSIS — L821 Other seborrheic keratosis: Secondary | ICD-10-CM | POA: Diagnosis not present

## 2017-01-25 DIAGNOSIS — Z08 Encounter for follow-up examination after completed treatment for malignant neoplasm: Secondary | ICD-10-CM | POA: Diagnosis not present

## 2017-01-25 DIAGNOSIS — X32XXXD Exposure to sunlight, subsequent encounter: Secondary | ICD-10-CM | POA: Diagnosis not present

## 2017-01-25 DIAGNOSIS — Z1283 Encounter for screening for malignant neoplasm of skin: Secondary | ICD-10-CM | POA: Diagnosis not present

## 2017-01-25 DIAGNOSIS — L570 Actinic keratosis: Secondary | ICD-10-CM | POA: Diagnosis not present

## 2017-02-09 DIAGNOSIS — J0100 Acute maxillary sinusitis, unspecified: Secondary | ICD-10-CM | POA: Diagnosis not present

## 2017-04-04 DIAGNOSIS — N528 Other male erectile dysfunction: Secondary | ICD-10-CM | POA: Diagnosis not present

## 2017-04-04 DIAGNOSIS — N401 Enlarged prostate with lower urinary tract symptoms: Secondary | ICD-10-CM | POA: Diagnosis not present

## 2017-04-04 DIAGNOSIS — N138 Other obstructive and reflux uropathy: Secondary | ICD-10-CM | POA: Diagnosis not present

## 2017-04-26 DIAGNOSIS — Z1283 Encounter for screening for malignant neoplasm of skin: Secondary | ICD-10-CM | POA: Diagnosis not present

## 2017-04-26 DIAGNOSIS — L570 Actinic keratosis: Secondary | ICD-10-CM | POA: Diagnosis not present

## 2017-04-26 DIAGNOSIS — Z08 Encounter for follow-up examination after completed treatment for malignant neoplasm: Secondary | ICD-10-CM | POA: Diagnosis not present

## 2017-04-26 DIAGNOSIS — X32XXXD Exposure to sunlight, subsequent encounter: Secondary | ICD-10-CM | POA: Diagnosis not present

## 2017-04-26 DIAGNOSIS — L821 Other seborrheic keratosis: Secondary | ICD-10-CM | POA: Diagnosis not present

## 2017-04-26 DIAGNOSIS — Z85820 Personal history of malignant melanoma of skin: Secondary | ICD-10-CM | POA: Diagnosis not present

## 2017-07-22 DIAGNOSIS — J069 Acute upper respiratory infection, unspecified: Secondary | ICD-10-CM | POA: Diagnosis not present

## 2017-07-26 DIAGNOSIS — X32XXXD Exposure to sunlight, subsequent encounter: Secondary | ICD-10-CM | POA: Diagnosis not present

## 2017-07-26 DIAGNOSIS — D225 Melanocytic nevi of trunk: Secondary | ICD-10-CM | POA: Diagnosis not present

## 2017-07-26 DIAGNOSIS — Z08 Encounter for follow-up examination after completed treatment for malignant neoplasm: Secondary | ICD-10-CM | POA: Diagnosis not present

## 2017-07-26 DIAGNOSIS — L570 Actinic keratosis: Secondary | ICD-10-CM | POA: Diagnosis not present

## 2017-07-26 DIAGNOSIS — Z85820 Personal history of malignant melanoma of skin: Secondary | ICD-10-CM | POA: Diagnosis not present

## 2017-07-28 DIAGNOSIS — J209 Acute bronchitis, unspecified: Secondary | ICD-10-CM | POA: Diagnosis not present

## 2017-08-02 ENCOUNTER — Other Ambulatory Visit: Payer: Self-pay | Admitting: Internal Medicine

## 2017-08-02 NOTE — Telephone Encounter (Signed)
CY Please advise on refill. Thanks.  

## 2017-08-02 NOTE — Telephone Encounter (Signed)
Ok refill x 6 months total 

## 2017-08-05 DIAGNOSIS — J302 Other seasonal allergic rhinitis: Secondary | ICD-10-CM | POA: Diagnosis not present

## 2017-08-24 DIAGNOSIS — Z4789 Encounter for other orthopedic aftercare: Secondary | ICD-10-CM | POA: Diagnosis not present

## 2017-08-24 DIAGNOSIS — M170 Bilateral primary osteoarthritis of knee: Secondary | ICD-10-CM | POA: Diagnosis not present

## 2017-10-13 DIAGNOSIS — H25811 Combined forms of age-related cataract, right eye: Secondary | ICD-10-CM | POA: Diagnosis not present

## 2017-10-13 DIAGNOSIS — H04123 Dry eye syndrome of bilateral lacrimal glands: Secondary | ICD-10-CM | POA: Diagnosis not present

## 2017-10-13 DIAGNOSIS — D3131 Benign neoplasm of right choroid: Secondary | ICD-10-CM | POA: Diagnosis not present

## 2017-10-13 DIAGNOSIS — H2512 Age-related nuclear cataract, left eye: Secondary | ICD-10-CM | POA: Diagnosis not present

## 2017-10-25 DIAGNOSIS — Z1283 Encounter for screening for malignant neoplasm of skin: Secondary | ICD-10-CM | POA: Diagnosis not present

## 2017-10-25 DIAGNOSIS — L821 Other seborrheic keratosis: Secondary | ICD-10-CM | POA: Diagnosis not present

## 2017-10-25 DIAGNOSIS — Z85820 Personal history of malignant melanoma of skin: Secondary | ICD-10-CM | POA: Diagnosis not present

## 2017-10-25 DIAGNOSIS — L570 Actinic keratosis: Secondary | ICD-10-CM | POA: Diagnosis not present

## 2017-10-25 DIAGNOSIS — Z08 Encounter for follow-up examination after completed treatment for malignant neoplasm: Secondary | ICD-10-CM | POA: Diagnosis not present

## 2017-10-25 DIAGNOSIS — X32XXXD Exposure to sunlight, subsequent encounter: Secondary | ICD-10-CM | POA: Diagnosis not present

## 2017-10-31 DIAGNOSIS — M5136 Other intervertebral disc degeneration, lumbar region: Secondary | ICD-10-CM | POA: Diagnosis not present

## 2017-11-03 ENCOUNTER — Encounter: Payer: Self-pay | Admitting: Internal Medicine

## 2017-11-03 ENCOUNTER — Ambulatory Visit: Payer: PPO | Admitting: Internal Medicine

## 2017-11-03 DIAGNOSIS — F5101 Primary insomnia: Secondary | ICD-10-CM

## 2017-11-03 DIAGNOSIS — G4733 Obstructive sleep apnea (adult) (pediatric): Secondary | ICD-10-CM

## 2017-11-03 MED ORDER — TRIAZOLAM 0.25 MG PO TABS: 0.2500 mg | ORAL_TABLET | Freq: Every evening | ORAL | 5 refills | Status: DC | PRN

## 2017-11-03 NOTE — Progress Notes (Signed)
HPI  M never smoker followed for OSA, insomnia, rhinitis, complicated by CAD/CABG NPSG 10/14/2002: AHI of 15.9 per hour; CPAP titrated to 8 CWP   body weight 180 pounds NPSG 07/30/12- AHI 6.7 / hr  desat <88% for 37.3 minutes without CPAP  -------------------------------------------------------------------------  11/03/2016-83 year old male never smoker followed for OSA, insomnia, rhinitis, complicated by CAD/CABG, HBP, GERD CPAP 10/[facility]  FOLLOWS FOR: Currently using CPAP every night for 6-7 hours. Denies issues with machine, mask or pressure. DME is [facility]. He and his wife had a recent head cold now resolving and he had knee surgery. CPAP mask is getting old and leaks. We think the machine itself may be old enough to need replacing. Pressure seems okay by his description. He finds mask leak bothersome issue but download shows good compliance and control.  11/03/17- 83 year old male never smoker followed for OSA, insomnia, rhinitis, complicated by CAD/CABG CPAP 10/[facility]> today replace old machine change to auto 5-15 ---OSA; DME: [facility]. Pt wears CPAP nightly and DL attached. Will need new supplies and ? discuss larger water chamber-his is using water quckly.   Halcion 0.25 mg, trazodone 50 mg Leg pains attributed to degenerative disc disease.  Breathing okay. Takes Halcion for 7-10 days then changes to trazodone for 7-10 days (gives vivid dreams) to avoid tolerance. Never got replacement CPAP machine last year and old one is worn out. Download 97% compliance, AHI 0.6/hour.  ROS-see HPI + = positive Constitutional:   No-   weight loss, night sweats, fevers, chills, +fatigue, lassitude. HEENT:   No-  headaches, difficulty swallowing, tooth/dental problems, sore throat,       No-  sneezing, itching, ear ache, +nasal congestion, post nasal drip,  CV:  No-   chest pain, orthopnea, PND, +swelling in lower extremities, No-anasarca, dizziness, palpitations Resp: +  shortness of breath with  exertion or at rest.              No-   productive cough,  No non-productive cough,  No- coughing up of blood.              No-   change in color of mucus.  No- wheezing.   Skin: No-   rash or lesions. GI:  No-   heartburn, indigestion, abdominal pain, nausea, vomiting,  GU: n. MS:  No-   joint pain or swelling.   Neuro-     nothing unusual Psych:  No- change in mood or affect. No depression or anxiety.  No memory loss.  OBJ- Physical Exam General- Alert, Oriented, Affect-appropriate, Distress- none acute, +overweight Skin- rash-none, lesions- none, excoriation- none Lymphadenopathy- none Head- atraumatic            Eyes- Gross vision intact, PERRLA, conjunctivae and secretions clear            Ears- Hearing, canals-normal            Nose- Clear, no-Septal dev, mucus, polyps, erosion, perforation             Throat- Mallampati III-IV , mucosa clear , drainage- none, tonsils- atrophic Neck- flexible , trachea midline, no stridor , thyroid nl, carotid no bruit Chest - symmetrical excursion , unlabored           Heart/CV- RRR , no murmur , no gallop  , no rub, nl s1 s2                           - JVD- none ,  edema- none, stasis changes- none, varices- none           Lung- clear to P&A, wheeze- none, cough- none , dullness-none, rub- none           Chest wall-  Abd-  Br/ Gen/ Rectal- Not done, not indicated Extrem- cyanosis- none, clubbing, none, atrophy- none, strength- nl Neuro- grossly intact to observation

## 2017-11-03 NOTE — Patient Instructions (Signed)
Order- DME [facility]- please replace old CPAP machine change to auto 5-15, mask of choice, humidifier, supplies, AirView    Dx OSA  Script printed to refill triazolam/ Halcion   Please call if we can help

## 2017-11-04 NOTE — Assessment & Plan Note (Signed)
He continues to benefit from CPAP.  Download confirms excellent compliance and control.  Old machine is worn out. Plan-replace CPAP, changing to AutoPap 5-15

## 2017-11-04 NOTE — Assessment & Plan Note (Signed)
We reviewed insomnia in conjunction with his OSA, emphasizing good sleep hygiene and safety considerations related to his age. Plan-refill Halcion as discussed.

## 2017-11-12 DIAGNOSIS — G4730 Sleep apnea, unspecified: Secondary | ICD-10-CM | POA: Diagnosis not present

## 2017-11-12 DIAGNOSIS — G4733 Obstructive sleep apnea (adult) (pediatric): Secondary | ICD-10-CM | POA: Diagnosis not present

## 2017-11-16 DIAGNOSIS — N400 Enlarged prostate without lower urinary tract symptoms: Secondary | ICD-10-CM | POA: Diagnosis not present

## 2017-11-16 DIAGNOSIS — F5104 Psychophysiologic insomnia: Secondary | ICD-10-CM | POA: Diagnosis not present

## 2017-11-16 DIAGNOSIS — N529 Male erectile dysfunction, unspecified: Secondary | ICD-10-CM | POA: Diagnosis not present

## 2017-11-16 DIAGNOSIS — Z0000 Encounter for general adult medical examination without abnormal findings: Secondary | ICD-10-CM | POA: Diagnosis not present

## 2017-11-16 DIAGNOSIS — G4733 Obstructive sleep apnea (adult) (pediatric): Secondary | ICD-10-CM | POA: Diagnosis not present

## 2017-11-16 DIAGNOSIS — Z1389 Encounter for screening for other disorder: Secondary | ICD-10-CM | POA: Diagnosis not present

## 2017-11-16 DIAGNOSIS — E669 Obesity, unspecified: Secondary | ICD-10-CM | POA: Diagnosis not present

## 2017-11-16 DIAGNOSIS — Z6830 Body mass index (BMI) 30.0-30.9, adult: Secondary | ICD-10-CM | POA: Diagnosis not present

## 2017-11-16 DIAGNOSIS — K5900 Constipation, unspecified: Secondary | ICD-10-CM | POA: Diagnosis not present

## 2017-11-16 DIAGNOSIS — I10 Essential (primary) hypertension: Secondary | ICD-10-CM | POA: Diagnosis not present

## 2017-11-16 DIAGNOSIS — E7800 Pure hypercholesterolemia, unspecified: Secondary | ICD-10-CM | POA: Diagnosis not present

## 2017-11-30 DIAGNOSIS — Z9289 Personal history of other medical treatment: Secondary | ICD-10-CM | POA: Insufficient documentation

## 2017-11-30 DIAGNOSIS — M1990 Unspecified osteoarthritis, unspecified site: Secondary | ICD-10-CM | POA: Insufficient documentation

## 2017-11-30 DIAGNOSIS — D649 Anemia, unspecified: Secondary | ICD-10-CM | POA: Insufficient documentation

## 2017-11-30 DIAGNOSIS — G4730 Sleep apnea, unspecified: Secondary | ICD-10-CM | POA: Insufficient documentation

## 2017-11-30 DIAGNOSIS — R0602 Shortness of breath: Secondary | ICD-10-CM | POA: Insufficient documentation

## 2017-11-30 DIAGNOSIS — M159 Polyosteoarthritis, unspecified: Secondary | ICD-10-CM | POA: Insufficient documentation

## 2017-11-30 DIAGNOSIS — G529 Cranial nerve disorder, unspecified: Secondary | ICD-10-CM

## 2017-11-30 DIAGNOSIS — F419 Anxiety disorder, unspecified: Secondary | ICD-10-CM | POA: Insufficient documentation

## 2017-11-30 DIAGNOSIS — D696 Thrombocytopenia, unspecified: Secondary | ICD-10-CM | POA: Insufficient documentation

## 2017-11-30 DIAGNOSIS — G629 Polyneuropathy, unspecified: Secondary | ICD-10-CM | POA: Insufficient documentation

## 2017-11-30 DIAGNOSIS — E669 Obesity, unspecified: Secondary | ICD-10-CM | POA: Insufficient documentation

## 2017-11-30 DIAGNOSIS — I10 Essential (primary) hypertension: Secondary | ICD-10-CM | POA: Insufficient documentation

## 2017-11-30 DIAGNOSIS — E7800 Pure hypercholesterolemia, unspecified: Secondary | ICD-10-CM | POA: Insufficient documentation

## 2017-11-30 DIAGNOSIS — C801 Malignant (primary) neoplasm, unspecified: Secondary | ICD-10-CM | POA: Insufficient documentation

## 2017-11-30 DIAGNOSIS — N400 Enlarged prostate without lower urinary tract symptoms: Secondary | ICD-10-CM | POA: Insufficient documentation

## 2017-11-30 DIAGNOSIS — I2510 Atherosclerotic heart disease of native coronary artery without angina pectoris: Secondary | ICD-10-CM | POA: Insufficient documentation

## 2017-11-30 DIAGNOSIS — K219 Gastro-esophageal reflux disease without esophagitis: Secondary | ICD-10-CM | POA: Insufficient documentation

## 2017-12-09 ENCOUNTER — Encounter: Payer: Self-pay | Admitting: Internal Medicine

## 2017-12-15 NOTE — Progress Notes (Signed)
CARDIOLOGY OFFICE NOTE  Date:  12/20/2017    Scott [name] Date of Birth: [birth year] Medical Record #[id number]  PCP:  Kirby Funk, MD  Cardiologist:  Deundra Bard Swaziland MD  Chief Complaint  Patient presents with  . Shortness of Breath    pt states some depending on what he is doing     History of Present Illness: Scott [name] is a 84 y.o. male who presents today for follow up CAD.  He has known CAD with remote CABG back in 2007 by  Dr. Tyrone Sage. Negative stress Myoview in 2010. Other issues include obesity, HTN, HLD and OSA. Last echo in 2013.    On follow up today he is doing very well. He denies any  chest pain or SOB. Stays very active.  BP is good. He has some pain in right thigh and is seeing Ortho for this.   Past Medical History:  Diagnosis Date  . Anemia    Acute blood loss anemia secondary to surgery, followed fr/ Hip replacement   . Anxiety    pt. reports that he has occas. nervous episodes   . BPH (benign prostatic hyperplasia)   . CAD (coronary artery disease)    s/p CABG in 2007  CARDILOLOGIST IS DR. P. [place]  . Cancer (HCC)    melanoma - removed L ear - 2010  . Cranial neuropathy    resolved   . Generalized OA   . GERD (gastroesophageal reflux disease)   . H/O echocardiogram    last echo, stress test 05/2012- pt. followed by [place]   . Hypercholesterolemia   . Hypertension   . Obesity   . Osteoarthritis    End-stage osteoarthritis, right hip  . Sleep apnea    with use of CPAP  . SOB (shortness of breath)    WITH EXERTION  . Thrombocytopenia (HCC)    possibly due to Lovenox    Past Surgical History:  Procedure Laterality Date  . ACHILLES TENDON SURGERY  10/06/2012   Procedure: ACHILLES TENDON REPAIR;  Surgeon: Thera Flake., MD;  Location: [facility] OR;  Service: Orthopedics;  Laterality: Right;  REPAIR RIGHT RUPTURE ACHILLES TENDON PRIMARY OPEN/PERCUTANEOUS, EXCISION PARTIAL BONE TALUS/CANCANEUS, REPAIR PARTIAL EXCISION CALCANEUS  .  APPENDECTOMY    . ARTERY BIOPSY     temporal - L side- wnl, double vision treated /w prednisone - 1991  . CALCANEAL OSTEOTOMY  10/06/2012   Procedure: CALCANEAL OSTEOTOMY;  Surgeon: Thera Flake., MD;  Location: [facility] OR;  Service: Orthopedics;  Laterality: Right;  . CARDIAC CATHETERIZATION  12/15/2005   NORMAL. EF 60%, angioplasty 1995  . carpel tunnel surgery-bilateral     both hands   . CORONARY ARTERY BYPASS GRAFT  2007   LIMA GRAFT TO THE LAD, SAPHENOUS VEIN GRAFT TO THE FIRST OBTUSE MARIGNAL, SAPHENOUS VEIN GRAFT TO THE SECOND OBTUSE MARGINAL AND A SAPHENOUS VEIN GRAFT TO THE ACUTE MARGINAL AND POSTERIOR LATERAL BRANCH  . HERNIA REPAIR    . I&D EXTREMITY  12/11/2012   Procedure: IRRIGATION AND DEBRIDEMENT EXTREMITY;  Surgeon: Nadara Mustard, MD;  Location: [facility] OR;  Service: Orthopedics;  Laterality: Right;  Irrigation and Debridement achilles, wound closure, apply wound vac, place antibiotic beads  . INGUINAL HERNIA REPAIR Left 05/10/2013   Procedure: HERNIA REPAIR INGUINAL ADULT;  Surgeon: Velora Heckler, MD;  Location: [facility] ORS;  Service: General;  Laterality: Left;  . INSERTION OF MESH Left 05/10/2013   Procedure: INSERTION OF MESH;  Surgeon: Huey Bienenstock  Gerkin, MD;  Location: [facility] ORS;  Service: General;  Laterality: Left;  . JOINT REPLACEMENT    . knee  11/12   left arthroscopic  . TOTAL HIP ARTHROPLASTY     right     Medications: Current Outpatient Medications  Medication Sig Dispense Refill  . aspirin 81 MG tablet Take 81 mg by mouth every evening.     . beta carotene w/minerals (OCUVITE) tablet Take 1 tablet by mouth every morning.    . bisacodyl (DULCOLAX) 5 MG EC tablet Take 5 mg by mouth daily as needed for constipation.    . calcium citrate (CALCITRATE - DOSED IN MG ELEMENTAL CALCIUM) 950 MG tablet Take 1 tablet by mouth every morning.    . clobetasol (TEMOVATE) 0.05 % external solution     . finasteride (PROSCAR) 5 MG tablet Take 5 mg by mouth daily before breakfast.     . fish  oil-omega-3 fatty acids 1000 MG capsule Take 1 g by mouth every other day.     . lovastatin (MEVACOR) 20 MG tablet Take 20 mg by mouth at bedtime.     . metoprolol (LOPRESSOR) 50 MG tablet Take 25 mg by mouth 2 (two) times daily.    . potassium chloride (K-DUR,KLOR-CON) 10 MEQ tablet Take 10 mEq by mouth every evening.     . ranitidine (ZANTAC) 75 MG tablet Take 75 mg by mouth daily before breakfast.    . Tamsulosin HCl (FLOMAX) 0.4 MG CAPS Take 0.4 mg by mouth daily after supper.    . temazepam (RESTORIL) 30 MG capsule Take 1 capsule (30 mg total) by mouth at bedtime as needed for sleep. 30 capsule 5  . tetrahydrozoline 0.05 % ophthalmic solution Place 1 drop into both eyes daily as needed (for dry eyes).    . traZODone (DESYREL) 50 MG tablet Take 1 tablet (50 mg total) by mouth at bedtime. 30 tablet prn  . triamterene-hydrochlorothiazide (MAXZIDE) 75-50 MG per tablet Take 1 tablet by mouth daily before breakfast.     . triazolam (HALCION) 0.25 MG tablet Take 1 tablet (0.25 mg total) by mouth at bedtime as needed. for sleep 30 tablet 5  . vitamin E 400 UNIT capsule Take 400 Units by mouth as needed.     No current facility-administered medications for this visit.     Allergies: Allergies  Allergen Reactions  . Acetaminophen     Other reaction(s): mouth sores  . Amlodipine Swelling    Swelling of feet and legs  . Bactrim [Sulfamethoxazole-Trimethoprim] Other (See Comments)    Constipation   . Codeine Nausea Only  . Fexofenadine     Other reaction(s): mouth sores  . Ibuprofen     Other reaction(s): nervousness  . Lovenox [Enoxaparin Sodium] Other (See Comments)    MAY HAVE CAUSED THROMBOCYTOPENIA  . Naproxen     Other reaction(s): mouth sores  . Prednisone Swelling    Patient has tolerated doses for colds. Thinks it caused swelling in the past  . Shellfish Allergy Nausea And Vomiting and Other (See Comments)    N&V, shaking  . Sonata [Zaleplon] Other (See Comments)    Hyper  feelings    Social History: The patient  reports that  has never smoked. he has never used smokeless tobacco. He reports that he does not drink alcohol or use drugs.   Family History: The patient's family history includes Heart disease in his brother and brother; Prostate cancer in his brother.   Review of Systems: Please see  the history of present illness.     All other systems are reviewed and negative.   Physical Exam: VS:  BP (!) 142/68   Pulse 69   Ht 5\' 5"  (1.651 m)   Wt 179 lb 6.4 oz (81.4 kg)   BMI 29.85 kg/m  .  BMI Body mass index is 29.85 kg/m.  Wt Readings from Last 3 Encounters:  12/20/17 179 lb 6.4 oz (81.4 kg)  11/03/17 182 lb (82.6 kg)  11/12/16 171 lb 12.8 oz (77.9 kg)    GENERAL:  Well appearing WM in NAD HEENT:  PERRL, EOMI, sclera are clear. Oropharynx is clear. NECK:  No jugular venous distention, carotid upstroke brisk and symmetric, no bruits, no thyromegaly or adenopathy LUNGS:  Clear to auscultation bilaterally CHEST:  Unremarkable HEART:  RRR,  PMI not displaced or sustained,S1 and S2 within normal limits, no S3, no S4: no clicks, no rubs, no murmurs ABD:  Soft, nontender. BS +, no masses or bruits. No hepatomegaly, no splenomegaly EXT:  2 + pulses throughout, no edema, no cyanosis no clubbing SKIN:  Warm and dry.  No rashes NEURO:  Alert and oriented x 3. Cranial nerves II through XII intact. PSYCH:  Cognitively intact     LABORATORY DATA:  EKG:  EKG is  ordered today. It shows NSR with mild nonspecific ST abnormality. I have personally reviewed and interpreted this study.   Lab Results  Component Value Date   WBC 8.9 05/13/2013   HGB 13.5 05/13/2013   HCT 39.5 05/13/2013   PLT PLATELETS APPEAR ADEQUATE 05/13/2013   GLUCOSE 97 05/13/2013   CHOL 152 05/17/2011   TRIG 148.0 05/17/2011   HDL 40.30 05/17/2011   LDLCALC 82 05/17/2011   ALT 13 05/12/2013   AST 18 05/12/2013   NA 138 05/13/2013   K 3.4 (L) 05/13/2013   CL 98 05/13/2013    CREATININE 0.86 05/13/2013   BUN 9 05/13/2013   CO2 33 (H) 05/13/2013   INR 0.97 12/11/2012    BNP (last 3 results) No results for input(s): BNP in the last 8760 hours.  ProBNP (last 3 results) No results for input(s): PROBNP in the last 8760 hours.   Other Studies Reviewed Today:  Labs from primary care: 01/07/16: cholesterol 141, triglycerides 144, LDL 65, HDL 47.  09/10/16: BMET normal. Dated 11/16/17: cholesterol 161, triglycerides 134, HDL 51, LDL 84. BMET normal  Assessment/Plan: 1.  CAD with prior CABG back in 2007 - he is asymptomatic. Continue medical therapy.  2. HTN - BP is well controlled.   3. HLD - on statin therapy with good control.   Disposition:   Follow up with Dr. Swaziland in one year    Signed: Yared Susan Swaziland MD, [facility]  12/20/2017 4:13 PM  [facility]

## 2017-12-19 ENCOUNTER — Telehealth: Payer: Self-pay | Admitting: Internal Medicine

## 2017-12-19 MED ORDER — TRIAZOLAM 0.25 MG PO TABS: 0.2500 mg | ORAL_TABLET | Freq: Every evening | ORAL | 5 refills | Status: DC | PRN

## 2017-12-19 NOTE — Telephone Encounter (Signed)
Called and spoke with Scott [name] to verify the med he was needing an Rx for.  Scott [name] stated the med was the Halcion which he uses to help him sleep.  Scott [name] stated that a printed Rx was given to him at his last OV with CY 11/03/17 but when Scott [name] was needing to take that Rx to his pharmacy, he misplaced the Rx and is needing a new one due to that.  CY, please advise if we can go ahead and call the Rx in to his pharmacy as he has misplaced the printed Rx that was given to him.  Allergies  Allergen Reactions  . Acetaminophen     Other reaction(s): mouth sores  . Amlodipine Swelling    Swelling of feet and legs  . Bactrim [Sulfamethoxazole-Trimethoprim] Other (See Comments)    Constipation   . Codeine Nausea Only  . Fexofenadine     Other reaction(s): mouth sores  . Ibuprofen     Other reaction(s): nervousness  . Lovenox [Enoxaparin Sodium] Other (See Comments)    MAY HAVE CAUSED THROMBOCYTOPENIA  . Naproxen     Other reaction(s): mouth sores  . Prednisone Swelling    Patient has tolerated doses for colds. Thinks it caused swelling in the past  . Shellfish Allergy Nausea And Vomiting and Other (See Comments)    N&V, shaking  . Sonata [Zaleplon] Other (See Comments)    Hyper feelings     Current Outpatient Medications:  .  aspirin 81 MG tablet, Take 81 mg by mouth every evening. , Disp: , Rfl:  .  beta carotene w/minerals (OCUVITE) tablet, Take 1 tablet by mouth every morning., Disp: , Rfl:  .  bisacodyl (DULCOLAX) 5 MG EC tablet, Take 5 mg by mouth daily as needed for constipation., Disp: , Rfl:  .  calcium citrate (CALCITRATE - DOSED IN MG ELEMENTAL CALCIUM) 950 MG tablet, Take 1 tablet by mouth every morning., Disp: , Rfl:  .  clobetasol (TEMOVATE) 0.05 % external solution, , Disp: , Rfl:  .  finasteride (PROSCAR) 5 MG tablet, Take 5 mg by mouth daily before breakfast. , Disp: , Rfl:  .  fish oil-omega-3 fatty acids 1000 MG capsule, Take 1 g by mouth every other day. , Disp: , Rfl:  .  lovastatin  (MEVACOR) 20 MG tablet, Take 20 mg by mouth at bedtime. , Disp: , Rfl:  .  metoprolol (LOPRESSOR) 50 MG tablet, Take 25 mg by mouth 2 (two) times daily., Disp: , Rfl:  .  potassium chloride (K-DUR,KLOR-CON) 10 MEQ tablet, Take 10 mEq by mouth every evening. , Disp: , Rfl:  .  ranitidine (ZANTAC) 75 MG tablet, Take 75 mg by mouth daily before breakfast., Disp: , Rfl:  .  Tamsulosin HCl (FLOMAX) 0.4 MG CAPS, Take 0.4 mg by mouth daily after supper., Disp: , Rfl:  .  temazepam (RESTORIL) 30 MG capsule, Take 1 capsule (30 mg total) by mouth at bedtime as needed for sleep., Disp: 30 capsule, Rfl: 5 .  tetrahydrozoline 0.05 % ophthalmic solution, Place 1 drop into both eyes daily as needed (for dry eyes)., Disp: , Rfl:  .  traZODone (DESYREL) 50 MG tablet, Take 1 tablet (50 mg total) by mouth at bedtime., Disp: 30 tablet, Rfl: prn .  triamterene-hydrochlorothiazide (MAXZIDE) 75-50 MG per tablet, Take 1 tablet by mouth daily before breakfast. , Disp: , Rfl:  .  triazolam (HALCION) 0.25 MG tablet, Take 1 tablet (0.25 mg total) by mouth at bedtime as needed. for sleep,  Disp: 30 tablet, Rfl: 5 .  vitamin E 400 UNIT capsule, Take 400 Units by mouth as needed., Disp: , Rfl:

## 2017-12-19 NOTE — Telephone Encounter (Signed)
rx called in to preferred pharmacy.  Pt's spouse (dpr on file) aware.  Nothing further needed.

## 2017-12-19 NOTE — Telephone Encounter (Signed)
Yes, ok w refill total 6 months

## 2017-12-20 ENCOUNTER — Encounter: Payer: Self-pay | Admitting: Cardiology

## 2017-12-20 ENCOUNTER — Ambulatory Visit: Payer: Medicare HMO | Admitting: Cardiology

## 2017-12-20 VITALS — BP 142/68 | HR 69 | Ht 65.0 in | Wt 179.4 lb

## 2017-12-20 DIAGNOSIS — I2510 Atherosclerotic heart disease of native coronary artery without angina pectoris: Secondary | ICD-10-CM | POA: Diagnosis not present

## 2017-12-20 DIAGNOSIS — I10 Essential (primary) hypertension: Secondary | ICD-10-CM | POA: Diagnosis not present

## 2017-12-20 DIAGNOSIS — E7800 Pure hypercholesterolemia, unspecified: Secondary | ICD-10-CM | POA: Diagnosis not present

## 2017-12-20 NOTE — Patient Instructions (Addendum)
Continue your current therapy  I will see you in one year   

## 2017-12-20 NOTE — Addendum Note (Signed)
Addended by: Kathyrn Lass on: 12/20/2017 04:20 PM   Modules accepted: Orders

## 2017-12-29 DIAGNOSIS — M5136 Other intervertebral disc degeneration, lumbar region: Secondary | ICD-10-CM | POA: Diagnosis not present

## 2017-12-29 DIAGNOSIS — M48061 Spinal stenosis, lumbar region without neurogenic claudication: Secondary | ICD-10-CM | POA: Diagnosis not present

## 2017-12-29 DIAGNOSIS — M25562 Pain in left knee: Secondary | ICD-10-CM | POA: Diagnosis not present

## 2018-01-06 DIAGNOSIS — M5136 Other intervertebral disc degeneration, lumbar region: Secondary | ICD-10-CM | POA: Diagnosis not present

## 2018-01-10 DIAGNOSIS — R69 Illness, unspecified: Secondary | ICD-10-CM | POA: Diagnosis not present

## 2018-01-19 DIAGNOSIS — M5136 Other intervertebral disc degeneration, lumbar region: Secondary | ICD-10-CM | POA: Diagnosis not present

## 2018-01-19 DIAGNOSIS — Z96641 Presence of right artificial hip joint: Secondary | ICD-10-CM | POA: Diagnosis not present

## 2018-01-19 DIAGNOSIS — M48062 Spinal stenosis, lumbar region with neurogenic claudication: Secondary | ICD-10-CM | POA: Diagnosis not present

## 2018-01-19 DIAGNOSIS — M79651 Pain in right thigh: Secondary | ICD-10-CM | POA: Diagnosis not present

## 2018-01-26 DIAGNOSIS — H04123 Dry eye syndrome of bilateral lacrimal glands: Secondary | ICD-10-CM | POA: Diagnosis not present

## 2018-01-26 DIAGNOSIS — D3132 Benign neoplasm of left choroid: Secondary | ICD-10-CM | POA: Diagnosis not present

## 2018-01-26 DIAGNOSIS — H25811 Combined forms of age-related cataract, right eye: Secondary | ICD-10-CM | POA: Diagnosis not present

## 2018-01-26 DIAGNOSIS — D3131 Benign neoplasm of right choroid: Secondary | ICD-10-CM | POA: Diagnosis not present

## 2018-01-26 DIAGNOSIS — H2512 Age-related nuclear cataract, left eye: Secondary | ICD-10-CM | POA: Diagnosis not present

## 2018-01-26 DIAGNOSIS — H5703 Miosis: Secondary | ICD-10-CM | POA: Diagnosis not present

## 2018-02-10 DIAGNOSIS — G4733 Obstructive sleep apnea (adult) (pediatric): Secondary | ICD-10-CM | POA: Diagnosis not present

## 2018-02-10 DIAGNOSIS — G4730 Sleep apnea, unspecified: Secondary | ICD-10-CM | POA: Diagnosis not present

## 2018-02-28 DIAGNOSIS — W19XXXA Unspecified fall, initial encounter: Secondary | ICD-10-CM | POA: Diagnosis not present

## 2018-02-28 DIAGNOSIS — S7002XA Contusion of left hip, initial encounter: Secondary | ICD-10-CM | POA: Diagnosis not present

## 2018-03-09 DIAGNOSIS — Z0100 Encounter for examination of eyes and vision without abnormal findings: Secondary | ICD-10-CM | POA: Diagnosis not present

## 2018-03-13 DIAGNOSIS — G4730 Sleep apnea, unspecified: Secondary | ICD-10-CM | POA: Diagnosis not present

## 2018-03-13 DIAGNOSIS — G4733 Obstructive sleep apnea (adult) (pediatric): Secondary | ICD-10-CM | POA: Diagnosis not present

## 2018-03-15 DIAGNOSIS — K029 Dental caries, unspecified: Secondary | ICD-10-CM | POA: Diagnosis not present

## 2018-03-15 DIAGNOSIS — S8012XA Contusion of left lower leg, initial encounter: Secondary | ICD-10-CM | POA: Diagnosis not present

## 2018-03-31 DIAGNOSIS — M79605 Pain in left leg: Secondary | ICD-10-CM | POA: Diagnosis not present

## 2018-03-31 DIAGNOSIS — M1711 Unilateral primary osteoarthritis, right knee: Secondary | ICD-10-CM | POA: Diagnosis not present

## 2018-03-31 DIAGNOSIS — M25572 Pain in left ankle and joints of left foot: Secondary | ICD-10-CM | POA: Diagnosis not present

## 2018-03-31 DIAGNOSIS — M79662 Pain in left lower leg: Secondary | ICD-10-CM | POA: Diagnosis not present

## 2018-03-31 DIAGNOSIS — M79661 Pain in right lower leg: Secondary | ICD-10-CM | POA: Diagnosis not present

## 2018-03-31 DIAGNOSIS — M79604 Pain in right leg: Secondary | ICD-10-CM | POA: Diagnosis not present

## 2018-04-03 ENCOUNTER — Ambulatory Visit (HOSPITAL_COMMUNITY)
Admission: RE | Admit: 2018-04-03 | Discharge: 2018-04-03 | Disposition: A | Discharge status: Home / Self Care | Payer: Medicare HMO | Source: Ambulatory Visit | Attending: Cardiovascular Disease | Admitting: Cardiovascular Disease

## 2018-04-03 ENCOUNTER — Other Ambulatory Visit (HOSPITAL_COMMUNITY): Payer: Self-pay | Admitting: Orthopedic Surgery

## 2018-04-03 DIAGNOSIS — M25572 Pain in left ankle and joints of left foot: Secondary | ICD-10-CM | POA: Insufficient documentation

## 2018-04-03 DIAGNOSIS — M79662 Pain in left lower leg: Secondary | ICD-10-CM | POA: Diagnosis not present

## 2018-04-03 DIAGNOSIS — M7989 Other specified soft tissue disorders: Secondary | ICD-10-CM | POA: Diagnosis not present

## 2018-04-12 DIAGNOSIS — G4730 Sleep apnea, unspecified: Secondary | ICD-10-CM | POA: Diagnosis not present

## 2018-04-12 DIAGNOSIS — G4733 Obstructive sleep apnea (adult) (pediatric): Secondary | ICD-10-CM | POA: Diagnosis not present

## 2018-05-05 DIAGNOSIS — M169 Osteoarthritis of hip, unspecified: Secondary | ICD-10-CM | POA: Diagnosis not present

## 2018-05-05 DIAGNOSIS — I2510 Atherosclerotic heart disease of native coronary artery without angina pectoris: Secondary | ICD-10-CM | POA: Diagnosis not present

## 2018-05-05 DIAGNOSIS — N400 Enlarged prostate without lower urinary tract symptoms: Secondary | ICD-10-CM | POA: Diagnosis not present

## 2018-05-05 DIAGNOSIS — M179 Osteoarthritis of knee, unspecified: Secondary | ICD-10-CM | POA: Diagnosis not present

## 2018-05-05 DIAGNOSIS — I10 Essential (primary) hypertension: Secondary | ICD-10-CM | POA: Diagnosis not present

## 2018-05-09 DIAGNOSIS — R69 Illness, unspecified: Secondary | ICD-10-CM | POA: Diagnosis not present

## 2018-05-13 DIAGNOSIS — G4733 Obstructive sleep apnea (adult) (pediatric): Secondary | ICD-10-CM | POA: Diagnosis not present

## 2018-05-13 DIAGNOSIS — G4730 Sleep apnea, unspecified: Secondary | ICD-10-CM | POA: Diagnosis not present

## 2018-06-02 DIAGNOSIS — I2510 Atherosclerotic heart disease of native coronary artery without angina pectoris: Secondary | ICD-10-CM | POA: Diagnosis not present

## 2018-06-02 DIAGNOSIS — Z85820 Personal history of malignant melanoma of skin: Secondary | ICD-10-CM | POA: Diagnosis not present

## 2018-06-02 DIAGNOSIS — I10 Essential (primary) hypertension: Secondary | ICD-10-CM | POA: Diagnosis not present

## 2018-06-02 DIAGNOSIS — M169 Osteoarthritis of hip, unspecified: Secondary | ICD-10-CM | POA: Diagnosis not present

## 2018-06-02 DIAGNOSIS — L821 Other seborrheic keratosis: Secondary | ICD-10-CM | POA: Diagnosis not present

## 2018-06-02 DIAGNOSIS — M179 Osteoarthritis of knee, unspecified: Secondary | ICD-10-CM | POA: Diagnosis not present

## 2018-06-02 DIAGNOSIS — N400 Enlarged prostate without lower urinary tract symptoms: Secondary | ICD-10-CM | POA: Diagnosis not present

## 2018-06-02 DIAGNOSIS — L570 Actinic keratosis: Secondary | ICD-10-CM | POA: Diagnosis not present

## 2018-06-02 DIAGNOSIS — Z08 Encounter for follow-up examination after completed treatment for malignant neoplasm: Secondary | ICD-10-CM | POA: Diagnosis not present

## 2018-06-02 DIAGNOSIS — Z1283 Encounter for screening for malignant neoplasm of skin: Secondary | ICD-10-CM | POA: Diagnosis not present

## 2018-06-02 DIAGNOSIS — X32XXXD Exposure to sunlight, subsequent encounter: Secondary | ICD-10-CM | POA: Diagnosis not present

## 2018-06-12 DIAGNOSIS — G4733 Obstructive sleep apnea (adult) (pediatric): Secondary | ICD-10-CM | POA: Diagnosis not present

## 2018-06-12 DIAGNOSIS — G4730 Sleep apnea, unspecified: Secondary | ICD-10-CM | POA: Diagnosis not present

## 2018-06-27 DIAGNOSIS — M179 Osteoarthritis of knee, unspecified: Secondary | ICD-10-CM | POA: Diagnosis not present

## 2018-06-27 DIAGNOSIS — I2510 Atherosclerotic heart disease of native coronary artery without angina pectoris: Secondary | ICD-10-CM | POA: Diagnosis not present

## 2018-06-27 DIAGNOSIS — I10 Essential (primary) hypertension: Secondary | ICD-10-CM | POA: Diagnosis not present

## 2018-06-27 DIAGNOSIS — N400 Enlarged prostate without lower urinary tract symptoms: Secondary | ICD-10-CM | POA: Diagnosis not present

## 2018-06-27 DIAGNOSIS — M169 Osteoarthritis of hip, unspecified: Secondary | ICD-10-CM | POA: Diagnosis not present

## 2018-07-13 DIAGNOSIS — G4730 Sleep apnea, unspecified: Secondary | ICD-10-CM | POA: Diagnosis not present

## 2018-07-13 DIAGNOSIS — G4733 Obstructive sleep apnea (adult) (pediatric): Secondary | ICD-10-CM | POA: Diagnosis not present

## 2018-07-20 ENCOUNTER — Other Ambulatory Visit: Payer: Self-pay | Admitting: Internal Medicine

## 2018-07-20 NOTE — Telephone Encounter (Signed)
Ok to refill total 6 months 

## 2018-07-20 NOTE — Telephone Encounter (Signed)
Rx called to pharm

## 2018-07-20 NOTE — Telephone Encounter (Signed)
CY Please advise on refill. Thanks.  

## 2018-07-29 DIAGNOSIS — J0100 Acute maxillary sinusitis, unspecified: Secondary | ICD-10-CM | POA: Diagnosis not present

## 2018-07-29 DIAGNOSIS — J069 Acute upper respiratory infection, unspecified: Secondary | ICD-10-CM | POA: Diagnosis not present

## 2018-07-29 DIAGNOSIS — I10 Essential (primary) hypertension: Secondary | ICD-10-CM | POA: Diagnosis not present

## 2018-07-31 DIAGNOSIS — M1990 Unspecified osteoarthritis, unspecified site: Secondary | ICD-10-CM | POA: Diagnosis not present

## 2018-07-31 DIAGNOSIS — E785 Hyperlipidemia, unspecified: Secondary | ICD-10-CM | POA: Diagnosis not present

## 2018-07-31 DIAGNOSIS — K08409 Partial loss of teeth, unspecified cause, unspecified class: Secondary | ICD-10-CM | POA: Diagnosis not present

## 2018-07-31 DIAGNOSIS — G8929 Other chronic pain: Secondary | ICD-10-CM | POA: Diagnosis not present

## 2018-07-31 DIAGNOSIS — N400 Enlarged prostate without lower urinary tract symptoms: Secondary | ICD-10-CM | POA: Diagnosis not present

## 2018-07-31 DIAGNOSIS — K219 Gastro-esophageal reflux disease without esophagitis: Secondary | ICD-10-CM | POA: Diagnosis not present

## 2018-07-31 DIAGNOSIS — I2510 Atherosclerotic heart disease of native coronary artery without angina pectoris: Secondary | ICD-10-CM | POA: Diagnosis not present

## 2018-07-31 DIAGNOSIS — G4700 Insomnia, unspecified: Secondary | ICD-10-CM | POA: Diagnosis not present

## 2018-07-31 DIAGNOSIS — H04129 Dry eye syndrome of unspecified lacrimal gland: Secondary | ICD-10-CM | POA: Diagnosis not present

## 2018-07-31 DIAGNOSIS — I10 Essential (primary) hypertension: Secondary | ICD-10-CM | POA: Diagnosis not present

## 2018-08-08 DIAGNOSIS — N400 Enlarged prostate without lower urinary tract symptoms: Secondary | ICD-10-CM | POA: Diagnosis not present

## 2018-08-08 DIAGNOSIS — I2510 Atherosclerotic heart disease of native coronary artery without angina pectoris: Secondary | ICD-10-CM | POA: Diagnosis not present

## 2018-08-08 DIAGNOSIS — E7800 Pure hypercholesterolemia, unspecified: Secondary | ICD-10-CM | POA: Diagnosis not present

## 2018-08-08 DIAGNOSIS — J309 Allergic rhinitis, unspecified: Secondary | ICD-10-CM | POA: Diagnosis not present

## 2018-08-08 DIAGNOSIS — R05 Cough: Secondary | ICD-10-CM | POA: Diagnosis not present

## 2018-08-08 DIAGNOSIS — M169 Osteoarthritis of hip, unspecified: Secondary | ICD-10-CM | POA: Diagnosis not present

## 2018-08-08 DIAGNOSIS — I10 Essential (primary) hypertension: Secondary | ICD-10-CM | POA: Diagnosis not present

## 2018-08-08 DIAGNOSIS — M179 Osteoarthritis of knee, unspecified: Secondary | ICD-10-CM | POA: Diagnosis not present

## 2018-08-13 DIAGNOSIS — G4733 Obstructive sleep apnea (adult) (pediatric): Secondary | ICD-10-CM | POA: Diagnosis not present

## 2018-08-13 DIAGNOSIS — G4730 Sleep apnea, unspecified: Secondary | ICD-10-CM | POA: Diagnosis not present

## 2018-08-25 DIAGNOSIS — R69 Illness, unspecified: Secondary | ICD-10-CM | POA: Diagnosis not present

## 2018-09-12 DIAGNOSIS — G4730 Sleep apnea, unspecified: Secondary | ICD-10-CM | POA: Diagnosis not present

## 2018-09-12 DIAGNOSIS — G4733 Obstructive sleep apnea (adult) (pediatric): Secondary | ICD-10-CM | POA: Diagnosis not present

## 2018-10-13 DIAGNOSIS — J209 Acute bronchitis, unspecified: Secondary | ICD-10-CM | POA: Diagnosis not present

## 2018-11-03 ENCOUNTER — Ambulatory Visit: Payer: PPO | Admitting: Internal Medicine

## 2018-11-14 DIAGNOSIS — G4733 Obstructive sleep apnea (adult) (pediatric): Secondary | ICD-10-CM | POA: Diagnosis not present

## 2018-11-21 DIAGNOSIS — K5900 Constipation, unspecified: Secondary | ICD-10-CM | POA: Diagnosis not present

## 2018-11-21 DIAGNOSIS — N400 Enlarged prostate without lower urinary tract symptoms: Secondary | ICD-10-CM | POA: Diagnosis not present

## 2018-11-21 DIAGNOSIS — Z1389 Encounter for screening for other disorder: Secondary | ICD-10-CM | POA: Diagnosis not present

## 2018-11-21 DIAGNOSIS — G4733 Obstructive sleep apnea (adult) (pediatric): Secondary | ICD-10-CM | POA: Diagnosis not present

## 2018-11-21 DIAGNOSIS — R69 Illness, unspecified: Secondary | ICD-10-CM | POA: Diagnosis not present

## 2018-11-21 DIAGNOSIS — I10 Essential (primary) hypertension: Secondary | ICD-10-CM | POA: Diagnosis not present

## 2018-11-21 DIAGNOSIS — E7800 Pure hypercholesterolemia, unspecified: Secondary | ICD-10-CM | POA: Diagnosis not present

## 2018-11-21 DIAGNOSIS — Z0000 Encounter for general adult medical examination without abnormal findings: Secondary | ICD-10-CM | POA: Diagnosis not present

## 2018-12-01 DIAGNOSIS — C4441 Basal cell carcinoma of skin of scalp and neck: Secondary | ICD-10-CM | POA: Diagnosis not present

## 2018-12-01 DIAGNOSIS — D225 Melanocytic nevi of trunk: Secondary | ICD-10-CM | POA: Diagnosis not present

## 2018-12-01 DIAGNOSIS — Z85820 Personal history of malignant melanoma of skin: Secondary | ICD-10-CM | POA: Diagnosis not present

## 2018-12-01 DIAGNOSIS — Z1283 Encounter for screening for malignant neoplasm of skin: Secondary | ICD-10-CM | POA: Diagnosis not present

## 2018-12-01 DIAGNOSIS — Z08 Encounter for follow-up examination after completed treatment for malignant neoplasm: Secondary | ICD-10-CM | POA: Diagnosis not present

## 2018-12-31 NOTE — Progress Notes (Signed)
CARDIOLOGY OFFICE NOTE  Date:  12/31/2018    Scott [name] Date of Birth: [birth year] Medical Record #[id number]  PCP:  Kirby Funk, MD  Cardiologist:  Jaramiah Bossard [place] MD  No chief complaint on file.   History of Present Illness: Scott [name] is a 85 y.o. male who presents today for follow up CAD.  He has known CAD with remote CABG back in 2007 by  Dr. Tyrone Sage. Negative stress Myoview in 2010. Other issues include  HTN, HLD and OSA. Last echo in 2013.    On follow up today he is doing very well. He denies any  chest pain or SOB. 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 No palpitations, dizziness or edema. Notes his knees are bad.   Past Medical History:  Diagnosis Date  . Anemia    Acute blood loss anemia secondary to surgery, followed fr/ Hip replacement   . Anxiety    pt. reports that he has occas. nervous episodes   . BPH (benign prostatic hyperplasia)   . CAD (coronary artery disease)    s/p CABG in 2007  CARDILOLOGIST IS DR. P. [place]  . Cancer (HCC)    melanoma - removed L ear - 2010  . Cranial neuropathy    resolved   . Generalized OA   . GERD (gastroesophageal reflux disease)   . H/O echocardiogram    last echo, stress test 05/2012- pt. followed by [facility]   . Hypercholesterolemia   . Hypertension   . Obesity   . Osteoarthritis    End-stage osteoarthritis, right hip  . Sleep apnea    with use of CPAP  . SOB (shortness of breath)    WITH EXERTION  . Thrombocytopenia (HCC)    possibly due to Lovenox    Past Surgical History:  Procedure Laterality Date  . ACHILLES TENDON SURGERY  10/06/2012   Procedure: ACHILLES TENDON REPAIR;  Surgeon: Thera Flake., MD;  Location: [facility] OR;  Service: Orthopedics;  Laterality: Right;  REPAIR RIGHT RUPTURE ACHILLES TENDON PRIMARY OPEN/PERCUTANEOUS, EXCISION PARTIAL BONE TALUS/CANCANEUS, REPAIR PARTIAL EXCISION CALCANEUS  . APPENDECTOMY    . ARTERY BIOPSY     temporal - L side- wnl, double vision  treated /w prednisone - 1991  . CALCANEAL OSTEOTOMY  10/06/2012   Procedure: CALCANEAL OSTEOTOMY;  Surgeon: Thera Flake., MD;  Location: [facility] OR;  Service: Orthopedics;  Laterality: Right;  . CARDIAC CATHETERIZATION  12/15/2005   NORMAL. EF 60%, angioplasty 1995  . carpel tunnel surgery-bilateral     both hands   . CORONARY ARTERY BYPASS GRAFT  2007   LIMA GRAFT TO THE LAD, SAPHENOUS VEIN GRAFT TO THE FIRST OBTUSE MARIGNAL, SAPHENOUS VEIN GRAFT TO THE SECOND OBTUSE MARGINAL AND A SAPHENOUS VEIN GRAFT TO THE ACUTE MARGINAL AND POSTERIOR LATERAL BRANCH  . HERNIA REPAIR    . I&D EXTREMITY  12/11/2012   Procedure: IRRIGATION AND DEBRIDEMENT EXTREMITY;  Surgeon: Nadara Mustard, MD;  Location: [facility] OR;  Service: Orthopedics;  Laterality: Right;  Irrigation and Debridement achilles, wound closure, apply wound vac, place antibiotic beads  . INGUINAL HERNIA REPAIR Left 05/10/2013   Procedure: HERNIA REPAIR INGUINAL ADULT;  Surgeon: Velora Heckler, MD;  Location: [facility] ORS;  Service: General;  Laterality: Left;  . INSERTION OF MESH Left 05/10/2013   Procedure: INSERTION OF MESH;  Surgeon: Velora Heckler, MD;  Location: [facility] ORS;  Service: General;  Laterality: Left;  . JOINT REPLACEMENT    .  knee  11/12   left arthroscopic  . TOTAL HIP ARTHROPLASTY     right     Medications: Current Outpatient Medications  Medication Sig Dispense Refill  . aspirin 81 MG tablet Take 81 mg by mouth every evening.     . beta carotene w/minerals (OCUVITE) tablet Take 1 tablet by mouth every morning.    . bisacodyl (DULCOLAX) 5 MG EC tablet Take 5 mg by mouth daily as needed for constipation.    . calcium citrate (CALCITRATE - DOSED IN MG ELEMENTAL CALCIUM) 950 MG tablet Take 1 tablet by mouth every morning.    . clobetasol (TEMOVATE) 0.05 % external solution     . finasteride (PROSCAR) 5 MG tablet Take 5 mg by mouth daily before breakfast.     . fish oil-omega-3 fatty acids 1000 MG capsule Take 1 g by mouth every other day.     .  lovastatin (MEVACOR) 20 MG tablet Take 20 mg by mouth at bedtime.     . metoprolol (LOPRESSOR) 50 MG tablet Take 25 mg by mouth 2 (two) times daily.    . potassium chloride (K-DUR,KLOR-CON) 10 MEQ tablet Take 10 mEq by mouth every evening.     . ranitidine (ZANTAC) 75 MG tablet Take 75 mg by mouth daily before breakfast.    . Tamsulosin HCl (FLOMAX) 0.4 MG CAPS Take 0.4 mg by mouth daily after supper.    . temazepam (RESTORIL) 30 MG capsule Take 1 capsule (30 mg total) by mouth at bedtime as needed for sleep. 30 capsule 5  . tetrahydrozoline 0.05 % ophthalmic solution Place 1 drop into both eyes daily as needed (for dry eyes).    . traZODone (DESYREL) 50 MG tablet Take 1 tablet (50 mg total) by mouth at bedtime. 30 tablet prn  . triamterene-hydrochlorothiazide (MAXZIDE) 75-50 MG per tablet Take 1 tablet by mouth daily before breakfast.     . triazolam (HALCION) 0.25 MG tablet TAKE 1 TABLET BY MOUTH AT BEDTIME AS NEEDED 30 tablet 5  . vitamin E 400 UNIT capsule Take 400 Units by mouth as needed.     No current facility-administered medications for this visit.     Allergies: Allergies  Allergen Reactions  . Acetaminophen     Other reaction(s): mouth sores  . Amlodipine Swelling    Swelling of feet and legs  . Bactrim [Sulfamethoxazole-Trimethoprim] Other (See Comments)    Constipation   . Codeine Nausea Only  . Fexofenadine     Other reaction(s): mouth sores  . Ibuprofen     Other reaction(s): nervousness  . Lovenox [Enoxaparin Sodium] Other (See Comments)    MAY HAVE CAUSED THROMBOCYTOPENIA  . Naproxen     Other reaction(s): mouth sores  . Prednisone Swelling    Patient has tolerated doses for colds. Thinks it caused swelling in the past  . Shellfish Allergy Nausea And Vomiting and Other (See Comments)    N&V, shaking  . Sonata [Zaleplon] Other (See Comments)    Hyper feelings    Social History: The patient  reports that he has never smoked. He has never used smokeless  tobacco. He reports that he does not drink alcohol or use drugs.   Family History: The patient's family history includes Heart disease in his brother and brother; Prostate cancer in his brother.   Review of Systems: Please see the history of present illness.     All other systems are reviewed and negative.   Physical Exam: VS:  There were no  vitals taken for this visit. Marland Kitchen  BMI There is no height or weight on file to calculate BMI.  Wt Readings from Last 3 Encounters:  12/20/17 179 lb 6.4 oz (81.4 kg)  11/03/17 182 lb (82.6 kg)  11/12/16 171 lb 12.8 oz (77.9 kg)   GENERAL:  Well appearing WM in NAD HEENT:  PERRL, EOMI, sclera are clear. Oropharynx is clear. NECK:  No jugular venous distention, carotid upstroke brisk and symmetric, no bruits, no thyromegaly or adenopathy LUNGS:  Clear to auscultation bilaterally CHEST:  Unremarkable HEART:  RRR,  PMI not displaced or sustained,S1 and S2 within normal limits, no S3, no S4: no clicks, no rubs, no murmurs ABD:  Soft, nontender. BS +, no masses or bruits. No hepatomegaly, no splenomegaly EXT:  2 + pulses throughout, no edema, no cyanosis no clubbing SKIN:  Warm and dry.  No rashes NEURO:  Alert and oriented x 3. Cranial nerves II through XII intact. PSYCH:  Cognitively intact       LABORATORY DATA:  EKG:  EKG is  ordered today. It shows NSR with first degree AV block. Rate 61.  I have personally reviewed and interpreted this study.   Lab Results  Component Value Date   WBC 8.9 05/13/2013   HGB 13.5 05/13/2013   HCT 39.5 05/13/2013   PLT PLATELETS APPEAR ADEQUATE 05/13/2013   GLUCOSE 97 05/13/2013   CHOL 152 05/17/2011   TRIG 148.0 05/17/2011   HDL 40.30 05/17/2011   LDLCALC 82 05/17/2011   ALT 13 05/12/2013   AST 18 05/12/2013   NA 138 05/13/2013   K 3.4 (L) 05/13/2013   CL 98 05/13/2013   CREATININE 0.86 05/13/2013   BUN 9 05/13/2013   CO2 33 (H) 05/13/2013   INR 0.97 12/11/2012    BNP (last 3 results) No  results for input(s): BNP in the last 8760 hours.  ProBNP (last 3 results) No results for input(s): PROBNP in the last 8760 hours.   Other Studies Reviewed Today:  Labs from primary care: 01/07/16: cholesterol 141, triglycerides 144, LDL 65, HDL 47.  09/10/16: BMET normal. Dated 11/16/17: cholesterol 161, triglycerides 134, HDL 51, LDL 84. BMET normal Dated 11/21/18: cholesterol 160, triglycerides 182, HDL 54, LDL 70. Potassium 3.3. creatinine normal.  Assessment/Plan: 1.  CAD with prior CABG back in 2007 - he remains asymptomatic. Continue medical therapy with ASA, beta blocker and statin.  2. HTN - BP is well controlled.   3. HLD - on statin therapy with good control.   Disposition:   Follow up  in one year    Signed: Augusto Deckman [place] MD, [facility]  12/31/2018 6:58 AM  [facility]

## 2019-01-04 ENCOUNTER — Encounter: Payer: Self-pay | Admitting: Cardiology

## 2019-01-04 ENCOUNTER — Ambulatory Visit: Payer: Medicare HMO | Admitting: Cardiology

## 2019-01-04 VITALS — BP 130/70 | HR 61 | Ht 65.0 in | Wt 168.8 lb

## 2019-01-04 DIAGNOSIS — E7800 Pure hypercholesterolemia, unspecified: Secondary | ICD-10-CM | POA: Diagnosis not present

## 2019-01-04 DIAGNOSIS — I25810 Atherosclerosis of coronary artery bypass graft(s) without angina pectoris: Secondary | ICD-10-CM | POA: Diagnosis not present

## 2019-01-04 DIAGNOSIS — I10 Essential (primary) hypertension: Secondary | ICD-10-CM | POA: Diagnosis not present

## 2019-01-09 ENCOUNTER — Ambulatory Visit: Payer: Medicare HMO | Admitting: Internal Medicine

## 2019-01-09 ENCOUNTER — Encounter: Payer: Self-pay | Admitting: Internal Medicine

## 2019-01-09 VITALS — BP 116/70 | HR 56 | Ht 65.0 in | Wt 173.0 lb

## 2019-01-09 DIAGNOSIS — F5101 Primary insomnia: Secondary | ICD-10-CM | POA: Diagnosis not present

## 2019-01-09 DIAGNOSIS — R69 Illness, unspecified: Secondary | ICD-10-CM | POA: Diagnosis not present

## 2019-01-09 DIAGNOSIS — G4733 Obstructive sleep apnea (adult) (pediatric): Secondary | ICD-10-CM

## 2019-01-09 MED ORDER — SUVOREXANT 15 MG PO TABS: 1.0000 | ORAL_TABLET | Freq: Every evening | ORAL | 5 refills | Status: DC | PRN

## 2019-01-09 NOTE — Assessment & Plan Note (Signed)
He continues to benefit from CPAP with improved sleep and is comfortable with current settings. Plan-continue CPAP auto 5-15

## 2019-01-09 NOTE — Progress Notes (Signed)
HPI  M never smoker followed for OSA, insomnia, rhinitis, complicated by CAD/CABG NPSG 10/14/2002: AHI of 15.9 per hour; CPAP titrated to 8 CWP   body weight 180 pounds NPSG 07/30/12- AHI 6.7 / hr  desat <88% for 37.3 minutes without CPAP  -------------------------------------------------------------------------  11/03/17- 85 year old male never smoker followed for OSA, insomnia, rhinitis, complicated by CAD/CABG CPAP 10/[facility]> today replace old machine change to auto 5-15 ---OSA; DME: [facility]. Pt wears CPAP nightly and DL attached. Will need new supplies and ? discuss larger water chamber-his is using water quckly.   Halcion 0.25 mg, trazodone 50 mg Leg pains attributed to degenerative disc disease.  Breathing okay. Takes Halcion for 7-10 days then changes to trazodone for 7-10 days (gives vivid dreams) to avoid tolerance. Never got replacement CPAP machine last year and old one is worn out. Download 97% compliance, AHI 0.6/hour.  01/09/2019- 85 year old male never smoker followed for OSA, Insomnia, rhinitis, complicated by CAD/CABG -----no complaints with CPAP - still some difficulty sleeping  CPAP auto 5-15/[facility]-replaced in 2018 Download 90% compliance AHI 0.9/hour He is satisfied with his replacement CPAP machine from last visit, with AutoPap. Still has trouble initiating and maintaining sleep.  Sleeps about 4 to 5 hours then cannot get back to sleep.  Never naps.  He thinks Halcion bothers his memory with sustained use and trazodone causes "crazy dreams".  He has tried other meds in the past.  We discussed possible use of Belsomra.  ROS-see HPI + = positive Constitutional:   No-   weight loss, night sweats, fevers, chills, +fatigue, lassitude. HEENT:   No-  headaches, difficulty swallowing, tooth/dental problems, sore throat,       No-  sneezing, itching, ear ache, +nasal congestion, post nasal drip,  CV:  No-   chest pain, orthopnea, PND, +swelling in lower extremities, No-anasarca,  dizziness, palpitations Resp: +  shortness of breath with exertion or at rest.              No-   productive cough,  No non-productive cough,  No- coughing up of blood.              No-   change in color of mucus.  No- wheezing.   Skin: No-   rash or lesions. GI:  No-   heartburn, indigestion, abdominal pain, nausea, vomiting,  GU: n. MS:  No-   joint pain or swelling.   Neuro-     nothing unusual Psych:  No- change in mood or affect. No depression or anxiety.  No memory loss.  OBJ- Physical Exam General- Alert, Oriented, Affect-appropriate, Distress- none acute,  Skin- rash-none, lesions- none, excoriation- none Lymphadenopathy- none Head- atraumatic            Eyes- Gross vision intact, PERRLA, conjunctivae and secretions clear            Ears- Hearing, canals-normal            Nose- Clear, no-Septal dev, mucus, polyps, erosion, perforation             Throat- Mallampati III-IV , mucosa clear , drainage- none, tonsils- atrophic Neck- flexible , trachea midline, no stridor , thyroid nl, carotid no bruit Chest - symmetrical excursion , unlabored           Heart/CV- RRR , no murmur , no gallop  , no rub, nl s1 s2                           -  JVD- none , edema- none, stasis changes- none, varices- none           Lung- clear to P&A, wheeze- none, cough- none , dullness-none, rub- none           Chest wall-  Abd-  Br/ Gen/ Rectal- Not done, not indicated Extrem- cyanosis- none, clubbing, none, atrophy- none, strength- nl Neuro- grossly intact to observation

## 2019-01-09 NOTE — Assessment & Plan Note (Signed)
We discussed sleep habits and medication, respecting age 85. Plan-try Belsomra.  He might be able to get financial help through [facility].

## 2019-01-09 NOTE — Patient Instructions (Addendum)
Script printed to try Belsomra for sleep instead of Halcion and Trazodone  Please call as needed  We can continue CPAP auto 5-15, mask of choice, humidifier, supplies, AirView or card

## 2019-01-12 DIAGNOSIS — X32XXXD Exposure to sunlight, subsequent encounter: Secondary | ICD-10-CM | POA: Diagnosis not present

## 2019-01-12 DIAGNOSIS — L570 Actinic keratosis: Secondary | ICD-10-CM | POA: Diagnosis not present

## 2019-01-12 DIAGNOSIS — Z08 Encounter for follow-up examination after completed treatment for malignant neoplasm: Secondary | ICD-10-CM | POA: Diagnosis not present

## 2019-01-12 DIAGNOSIS — Z85828 Personal history of other malignant neoplasm of skin: Secondary | ICD-10-CM | POA: Diagnosis not present

## 2019-01-17 DIAGNOSIS — R69 Illness, unspecified: Secondary | ICD-10-CM | POA: Diagnosis not present

## 2019-01-17 DIAGNOSIS — J309 Allergic rhinitis, unspecified: Secondary | ICD-10-CM | POA: Diagnosis not present

## 2019-01-17 DIAGNOSIS — I2510 Atherosclerotic heart disease of native coronary artery without angina pectoris: Secondary | ICD-10-CM | POA: Diagnosis not present

## 2019-01-17 DIAGNOSIS — E785 Hyperlipidemia, unspecified: Secondary | ICD-10-CM | POA: Diagnosis not present

## 2019-01-17 DIAGNOSIS — Z008 Encounter for other general examination: Secondary | ICD-10-CM | POA: Diagnosis not present

## 2019-01-17 DIAGNOSIS — G4700 Insomnia, unspecified: Secondary | ICD-10-CM | POA: Diagnosis not present

## 2019-01-17 DIAGNOSIS — G629 Polyneuropathy, unspecified: Secondary | ICD-10-CM | POA: Diagnosis not present

## 2019-01-17 DIAGNOSIS — I10 Essential (primary) hypertension: Secondary | ICD-10-CM | POA: Diagnosis not present

## 2019-01-17 DIAGNOSIS — G8929 Other chronic pain: Secondary | ICD-10-CM | POA: Diagnosis not present

## 2019-01-17 DIAGNOSIS — G4730 Sleep apnea, unspecified: Secondary | ICD-10-CM | POA: Diagnosis not present

## 2019-01-17 DIAGNOSIS — H04129 Dry eye syndrome of unspecified lacrimal gland: Secondary | ICD-10-CM | POA: Diagnosis not present

## 2019-01-26 DIAGNOSIS — M1711 Unilateral primary osteoarthritis, right knee: Secondary | ICD-10-CM | POA: Diagnosis not present

## 2019-01-26 DIAGNOSIS — M25561 Pain in right knee: Secondary | ICD-10-CM | POA: Diagnosis not present

## 2019-02-09 DIAGNOSIS — M1711 Unilateral primary osteoarthritis, right knee: Secondary | ICD-10-CM | POA: Diagnosis not present

## 2019-02-09 DIAGNOSIS — M25561 Pain in right knee: Secondary | ICD-10-CM | POA: Diagnosis not present

## 2019-02-10 ENCOUNTER — Other Ambulatory Visit: Payer: Self-pay | Admitting: Internal Medicine

## 2019-02-14 ENCOUNTER — Telehealth: Payer: Self-pay | Admitting: Internal Medicine

## 2019-02-14 MED ORDER — TRIAZOLAM 0.25 MG PO TABS: 0.2500 mg | ORAL_TABLET | Freq: Every evening | ORAL | 5 refills | Status: DC | PRN

## 2019-02-14 NOTE — Telephone Encounter (Signed)
Called and spoke with patient, he stated that he is needing a refill of his Halicon. This was last refilled on 07/20/18 with quantity of 30 and 5 refills. Patient was last seen on 01/09/19.  CY please advise, thank you.   Current Outpatient Medications on File Prior to Visit  Medication Sig Dispense Refill  . aspirin 81 MG tablet Take 81 mg by mouth every evening.     . beta carotene w/minerals (OCUVITE) tablet Take 1 tablet by mouth every morning.    . bisacodyl (DULCOLAX) 5 MG EC tablet Take 5 mg by mouth daily as needed for constipation.    . calcium citrate (CALCITRATE - DOSED IN MG ELEMENTAL CALCIUM) 950 MG tablet Take 1 tablet by mouth every morning.    . clobetasol (TEMOVATE) 0.05 % external solution     . finasteride (PROSCAR) 5 MG tablet Take 5 mg by mouth daily before breakfast.     . fish oil-omega-3 fatty acids 1000 MG capsule Take 1 g by mouth every other day.     . lovastatin (MEVACOR) 20 MG tablet Take 20 mg by mouth at bedtime.     . metoprolol (LOPRESSOR) 50 MG tablet Take 25 mg by mouth 2 (two) times daily.    . potassium chloride (K-DUR,KLOR-CON) 10 MEQ tablet Take 10 mEq by mouth every evening.     . ranitidine (ZANTAC) 75 MG tablet Take 75 mg by mouth daily before breakfast.    . Suvorexant (BELSOMRA) 15 MG TABS Take 1 tablet by mouth at bedtime as needed. 30 tablet 5  . Tamsulosin HCl (FLOMAX) 0.4 MG CAPS Take 0.4 mg by mouth daily after supper.    . temazepam (RESTORIL) 30 MG capsule Take 1 capsule (30 mg total) by mouth at bedtime as needed for sleep. 30 capsule 5  . tetrahydrozoline 0.05 % ophthalmic solution Place 1 drop into both eyes daily as needed (for dry eyes).    . traZODone (DESYREL) 50 MG tablet Take 1 tablet (50 mg total) by mouth at bedtime. 30 tablet prn  . triamterene-hydrochlorothiazide (MAXZIDE) 75-50 MG per tablet Take 1 tablet by mouth daily before breakfast.     . triazolam (HALCION) 0.25 MG tablet TAKE 1 TABLET BY MOUTH AT BEDTIME AS NEEDED 30 tablet 5   . vitamin E 400 UNIT capsule Take 400 Units by mouth as needed.     No current facility-administered medications on file prior to visit.

## 2019-02-14 NOTE — Telephone Encounter (Signed)
Halcion refill e-sent

## 2019-02-14 NOTE — Telephone Encounter (Signed)
Called and spoke with pt letting him know that the halcion had been refilled for him. Pt expressed understanding.nothing further needed.

## 2019-03-08 DIAGNOSIS — G4733 Obstructive sleep apnea (adult) (pediatric): Secondary | ICD-10-CM | POA: Diagnosis not present

## 2019-03-08 DIAGNOSIS — G4730 Sleep apnea, unspecified: Secondary | ICD-10-CM | POA: Diagnosis not present

## 2019-04-16 ENCOUNTER — Observation Stay (HOSPITAL_COMMUNITY)
Admission: EM | Admit: 2019-04-16 | Discharge: 2019-04-18 | Disposition: A | Discharge status: Home / Self Care | Payer: Medicare HMO | Attending: Student | Admitting: Student

## 2019-04-16 ENCOUNTER — Other Ambulatory Visit: Payer: Self-pay

## 2019-04-16 ENCOUNTER — Encounter (HOSPITAL_COMMUNITY): Payer: Self-pay

## 2019-04-16 ENCOUNTER — Emergency Department (HOSPITAL_COMMUNITY): Payer: Medicare HMO

## 2019-04-16 ENCOUNTER — Telehealth: Payer: Self-pay | Admitting: Cardiology

## 2019-04-16 DIAGNOSIS — F419 Anxiety disorder, unspecified: Secondary | ICD-10-CM | POA: Insufficient documentation

## 2019-04-16 DIAGNOSIS — R079 Chest pain, unspecified: Secondary | ICD-10-CM

## 2019-04-16 DIAGNOSIS — I2510 Atherosclerotic heart disease of native coronary artery without angina pectoris: Secondary | ICD-10-CM | POA: Diagnosis not present

## 2019-04-16 DIAGNOSIS — I10 Essential (primary) hypertension: Secondary | ICD-10-CM | POA: Diagnosis present

## 2019-04-16 DIAGNOSIS — Z79899 Other long term (current) drug therapy: Secondary | ICD-10-CM | POA: Diagnosis not present

## 2019-04-16 DIAGNOSIS — Z96641 Presence of right artificial hip joint: Secondary | ICD-10-CM | POA: Diagnosis not present

## 2019-04-16 DIAGNOSIS — G4733 Obstructive sleep apnea (adult) (pediatric): Secondary | ICD-10-CM | POA: Diagnosis present

## 2019-04-16 DIAGNOSIS — D649 Anemia, unspecified: Secondary | ICD-10-CM | POA: Insufficient documentation

## 2019-04-16 DIAGNOSIS — K219 Gastro-esophageal reflux disease without esophagitis: Secondary | ICD-10-CM | POA: Insufficient documentation

## 2019-04-16 DIAGNOSIS — N529 Male erectile dysfunction, unspecified: Secondary | ICD-10-CM | POA: Diagnosis not present

## 2019-04-16 DIAGNOSIS — E785 Hyperlipidemia, unspecified: Secondary | ICD-10-CM | POA: Diagnosis not present

## 2019-04-16 DIAGNOSIS — N400 Enlarged prostate without lower urinary tract symptoms: Secondary | ICD-10-CM | POA: Diagnosis present

## 2019-04-16 DIAGNOSIS — K5730 Diverticulosis of large intestine without perforation or abscess without bleeding: Secondary | ICD-10-CM | POA: Insufficient documentation

## 2019-04-16 DIAGNOSIS — Z951 Presence of aortocoronary bypass graft: Secondary | ICD-10-CM | POA: Insufficient documentation

## 2019-04-16 DIAGNOSIS — R0602 Shortness of breath: Secondary | ICD-10-CM | POA: Diagnosis not present

## 2019-04-16 DIAGNOSIS — Z85820 Personal history of malignant melanoma of skin: Secondary | ICD-10-CM | POA: Diagnosis not present

## 2019-04-16 DIAGNOSIS — I440 Atrioventricular block, first degree: Secondary | ICD-10-CM | POA: Diagnosis not present

## 2019-04-16 DIAGNOSIS — N281 Cyst of kidney, acquired: Secondary | ICD-10-CM | POA: Diagnosis not present

## 2019-04-16 DIAGNOSIS — E669 Obesity, unspecified: Secondary | ICD-10-CM | POA: Diagnosis not present

## 2019-04-16 DIAGNOSIS — Z955 Presence of coronary angioplasty implant and graft: Secondary | ICD-10-CM | POA: Insufficient documentation

## 2019-04-16 DIAGNOSIS — K429 Umbilical hernia without obstruction or gangrene: Secondary | ICD-10-CM | POA: Diagnosis not present

## 2019-04-16 DIAGNOSIS — E7800 Pure hypercholesterolemia, unspecified: Secondary | ICD-10-CM | POA: Insufficient documentation

## 2019-04-16 DIAGNOSIS — D696 Thrombocytopenia, unspecified: Secondary | ICD-10-CM | POA: Diagnosis not present

## 2019-04-16 DIAGNOSIS — G4700 Insomnia, unspecified: Secondary | ICD-10-CM | POA: Insufficient documentation

## 2019-04-16 DIAGNOSIS — R69 Illness, unspecified: Secondary | ICD-10-CM | POA: Diagnosis not present

## 2019-04-16 DIAGNOSIS — Z03818 Encounter for observation for suspected exposure to other biological agents ruled out: Secondary | ICD-10-CM | POA: Diagnosis not present

## 2019-04-16 DIAGNOSIS — I700 Atherosclerosis of aorta: Secondary | ICD-10-CM | POA: Diagnosis not present

## 2019-04-16 DIAGNOSIS — K859 Acute pancreatitis without necrosis or infection, unspecified: Principal | ICD-10-CM | POA: Diagnosis present

## 2019-04-16 DIAGNOSIS — Z7982 Long term (current) use of aspirin: Secondary | ICD-10-CM | POA: Diagnosis not present

## 2019-04-16 DIAGNOSIS — K449 Diaphragmatic hernia without obstruction or gangrene: Secondary | ICD-10-CM | POA: Insufficient documentation

## 2019-04-16 DIAGNOSIS — Z8249 Family history of ischemic heart disease and other diseases of the circulatory system: Secondary | ICD-10-CM | POA: Insufficient documentation

## 2019-04-16 DIAGNOSIS — Z1159 Encounter for screening for other viral diseases: Secondary | ICD-10-CM | POA: Insufficient documentation

## 2019-04-16 LAB — BASIC METABOLIC PANEL
Anion gap: 15 (ref 5–15)
BUN: 11 mg/dL (ref 8–23)
CO2: 28 mmol/L (ref 22–32)
Calcium: 9.2 mg/dL (ref 8.9–10.3)
Chloride: 97 mmol/L — ABNORMAL LOW (ref 98–111)
Creatinine, Ser: 1.01 mg/dL (ref 0.61–1.24)
GFR calc Af Amer: >60 mL/min (ref >60)
GFR calc non Af Amer: >60 mL/min (ref >60)
Glucose, Bld: 106 mg/dL — ABNORMAL HIGH (ref 70–99)
Potassium: 3.5 mmol/L (ref 3.5–5.1)
Sodium: 140 mmol/L (ref 135–145)

## 2019-04-16 LAB — HEPATIC FUNCTION PANEL
ALT: 15 U/L (ref 0–44)
AST: 19 U/L (ref 15–41)
Albumin: 3.8 g/dL (ref 3.5–5.0)
Alkaline Phosphatase: 70 U/L (ref 38–126)
Bilirubin, Direct: 0.3 mg/dL — ABNORMAL HIGH (ref 0.0–0.2)
Indirect Bilirubin: 1.9 mg/dL — ABNORMAL HIGH (ref 0.3–0.9)
Total Bilirubin: 2.2 mg/dL — ABNORMAL HIGH (ref 0.3–1.2)
Total Protein: 6.6 g/dL (ref 6.5–8.1)

## 2019-04-16 LAB — CBC
HCT: 42.4 % (ref 39.0–52.0)
Hemoglobin: 14.2 g/dL (ref 13.0–17.0)
MCH: 32.3 pg (ref 26.0–34.0)
MCHC: 33.5 g/dL (ref 30.0–36.0)
MCV: 96.6 fL (ref 80.0–100.0)
Platelets: DECREASED 10*3/uL (ref 150–400)
RBC: 4.39 MIL/uL (ref 4.22–5.81)
RDW: 12.8 % (ref 11.5–15.5)
WBC: 9.9 10*3/uL (ref 4.0–10.5)
nRBC: 0 % (ref 0.0–0.2)

## 2019-04-16 LAB — LIPASE, BLOOD: Lipase: 60 U/L — ABNORMAL HIGH (ref 11–51)

## 2019-04-16 LAB — TROPONIN I
Troponin I: <0.03 ng/mL (ref <0.03)
Troponin I: <0.03 ng/mL (ref <0.03)

## 2019-04-16 MED ORDER — IOHEXOL 300 MG/ML  SOLN
100.0000 mL | Freq: Once | INTRAMUSCULAR | Status: AC | PRN
  Administered 2019-04-16: 100 mL via INTRAVENOUS

## 2019-04-16 MED ORDER — SODIUM CHLORIDE 0.9 % IV SOLN
Freq: Once | INTRAVENOUS | Status: AC
  Administered 2019-04-16: 22:00:00 via INTRAVENOUS

## 2019-04-16 MED ORDER — PANTOPRAZOLE SODIUM 40 MG IV SOLR
40.0000 mg | Freq: Once | INTRAVENOUS | Status: AC
  Administered 2019-04-16: 40 mg via INTRAVENOUS
  Filled 2019-04-16: qty 40

## 2019-04-16 MED ORDER — SODIUM CHLORIDE 0.9% FLUSH: 3.0000 mL | Freq: Once | INTRAVENOUS | Status: DC

## 2019-04-16 MED ORDER — SODIUM CHLORIDE 0.9 % IV BOLUS
250.0000 mL | Freq: Once | INTRAVENOUS | Status: AC
  Administered 2019-04-16: 250 mL via INTRAVENOUS

## 2019-04-16 MED ORDER — MORPHINE SULFATE (PF) 2 MG/ML IV SOLN
2.0000 mg | Freq: Once | INTRAVENOUS | Status: AC
  Administered 2019-04-16: 2 mg via INTRAVENOUS
  Filled 2019-04-16: qty 1

## 2019-04-16 NOTE — ED Provider Notes (Addendum)
[facility] EMERGENCY DEPARTMENT Provider Note   CSN: [id number] Arrival date & time: 04/16/19  1656    History   Chief Complaint Chief Complaint  Patient presents with  . Chest Pain  . Nausea    HPI Scott [name] is a 85 y.o. male.     HPI Reports he has been getting some pain and discomfort has been occurring in his central abdomen and then upwards towards the top of his epigastrium.  He reports that basically moves up and is very painful and stops, he points to the epigastrium.  He reports then he can feel a burning and nausea sensation come up into his throat.  He reports it is quite uncomfortable.  He was somewhat concerned about his heart because he has a history of coronary artery bypass but also was thinking that maybe it was a recurrence of an ulcer.  He had very distant history of an ulcer.  He had stopped taking Zantac about a month ago because of concerns that might cause cancer.  Patient does not drink alcohol.  Reports he has had complete loss of appetite and has not eaten for couple of days.  Reports he is continue to take some fluids.  Reports he feels pretty nauseated but has not had active vomiting.  Ports bowel movements have been normal without being black or tarry.  He reports at baseline he has a little bit of urinary dribbling but has not had any pain or burning. Past Medical History:  Diagnosis Date  . Anemia    Acute blood loss anemia secondary to surgery, followed fr/ Hip replacement   . Anxiety    pt. reports that he has occas. nervous episodes   . BPH (benign prostatic hyperplasia)   . CAD (coronary artery disease)    s/p CABG in 2007  CARDILOLOGIST IS DR. P. [place]  . Cancer ([place])    melanoma - removed L ear - 2010  . Cranial neuropathy    resolved   . Generalized OA   . GERD (gastroesophageal reflux disease)   . H/O echocardiogram    last echo, stress test 05/2012- pt. followed by [facility]   . Hypercholesterolemia   .  Hypertension   . Obesity   . Osteoarthritis    End-stage osteoarthritis, right hip  . Sleep apnea    with use of CPAP  . SOB (shortness of breath)    WITH EXERTION  . Thrombocytopenia ([place])    possibly due to Lovenox    Patient Active Problem List   Diagnosis Date Noted  . Thrombocytopenia ([place])   . SOB (shortness of breath)   . Osteoarthritis   . Obesity   . Hypertension   . Hypercholesterolemia   . H/O echocardiogram   . GERD (gastroesophageal reflux disease)   . Cranial neuropathy   . Generalized OA   . Cancer ([place])   . CAD (coronary artery disease)   . BPH (benign prostatic hyperplasia)   . Anxiety   . Anemia   . Allergic rhinitis 09/09/2015  . Anxiety disorder 09/09/2015  . Atherosclerotic heart disease of native coronary artery without angina pectoris 09/09/2015  . Enlarged prostate without lower urinary tract symptoms (luts) 09/09/2015  . Chest pain 09/09/2015  . Constipation 09/09/2015  . Erectile dysfunction due to arterial insufficiency 09/09/2015  . Male erectile dysfunction 09/09/2015  . Pure hypercholesterolemia 09/09/2015  . Osteoarthritis of hip 09/09/2015  . Osteoarthritis of knee 09/09/2015  . Malignant neoplasm of skin 09/09/2015  .  Screening for gout 01/06/2015  . Essential (primary) hypertension 01/06/2015  . Inguinal hernia unilateral, non-recurrent, left 03/22/2013  . Umbilical hernia, asymptomatic 03/22/2013  . Acute bronchitis 07/11/2012  . DOE (dyspnea on exertion) 05/17/2012  . Hyperlipidemia 05/17/2011  . Obstructive sleep apnea 01/06/2008  . Essential hypertension 01/06/2008  . CAD in native artery 01/06/2008  . Insomnia 01/06/2008    Past Surgical History:  Procedure Laterality Date  . ACHILLES TENDON SURGERY  10/06/2012   Procedure: ACHILLES TENDON REPAIR;  Surgeon: Yvette Rack., MD;  Location: [facility];  Service: Orthopedics;  Laterality: Right;  REPAIR RIGHT RUPTURE ACHILLES TENDON PRIMARY OPEN/PERCUTANEOUS, EXCISION PARTIAL BONE  TALUS/CANCANEUS, REPAIR PARTIAL EXCISION CALCANEUS  . APPENDECTOMY    . ARTERY BIOPSY     temporal - L side- wnl, double vision treated /w prednisone - 1991  . CALCANEAL OSTEOTOMY  10/06/2012   Procedure: CALCANEAL OSTEOTOMY;  Surgeon: Yvette Rack., MD;  Location: [facility];  Service: Orthopedics;  Laterality: Right;  . CARDIAC CATHETERIZATION  12/15/2005   NORMAL. EF 60%, angioplasty 1995  . carpel tunnel surgery-bilateral     both hands   . CORONARY ARTERY BYPASS GRAFT  2007   LIMA GRAFT TO THE LAD, SAPHENOUS VEIN GRAFT TO THE FIRST OBTUSE MARIGNAL, SAPHENOUS VEIN GRAFT TO THE SECOND OBTUSE MARGINAL AND A SAPHENOUS VEIN GRAFT TO THE ACUTE MARGINAL AND POSTERIOR LATERAL BRANCH  . HERNIA REPAIR    . I&D EXTREMITY  12/11/2012   Procedure: IRRIGATION AND DEBRIDEMENT EXTREMITY;  Surgeon: Newt Minion, MD;  Location: [facility];  Service: Orthopedics;  Laterality: Right;  Irrigation and Debridement achilles, wound closure, apply wound vac, place antibiotic beads  . INGUINAL HERNIA REPAIR Left 05/10/2013   Procedure: HERNIA REPAIR INGUINAL ADULT;  Surgeon: Earnstine Regal, MD;  Location: [facility];  Service: General;  Laterality: Left;  . INSERTION OF MESH Left 05/10/2013   Procedure: INSERTION OF MESH;  Surgeon: Earnstine Regal, MD;  Location: [facility];  Service: General;  Laterality: Left;  . JOINT REPLACEMENT    . knee  11/12   left arthroscopic  . TOTAL HIP ARTHROPLASTY     right        Home Medications    Prior to Admission medications   Medication Sig Start Date End Date Taking? Authorizing Provider  aspirin 81 MG tablet Take 81 mg by mouth every evening.    Yes [provider]  finasteride (PROSCAR) 5 MG tablet Take 5 mg by mouth daily before breakfast.    Yes [provider]  fish oil-omega-3 fatty acids 1000 MG capsule Take 1 g by mouth every other day.    Yes [provider]  lovastatin (MEVACOR) 20 MG tablet Take 20 mg by mouth at bedtime.    Yes [provider]  metoprolol (LOPRESSOR) 50 MG tablet Take 25 mg by mouth 2 (two) times daily.   Yes [provider]  potassium chloride (K-DUR,KLOR-CON) 10 MEQ tablet Take 10 mEq by mouth every evening.    Yes [provider]  Suvorexant (BELSOMRA) 15 MG TABS Take 1 tablet by mouth at bedtime as needed. Patient taking differently: Take 1 tablet by mouth at bedtime as needed (insomnia).  01/09/19  Yes Young, Tarri Fuller D, MD  Tamsulosin HCl (FLOMAX) 0.4 MG CAPS Take 0.4 mg by mouth daily after supper.   Yes [provider]  traZODone (DESYREL) 50 MG tablet Take 1 tablet (50 mg total) by mouth at bedtime. 12/03/14  Yes Young, Tarri Fuller D, MD  triamterene-hydrochlorothiazide (MAXZIDE) 75-50 MG per tablet Take 1 tablet by mouth daily before breakfast.    Yes [provider]  triazolam (HALCION) 0.25 MG tablet Take 1 tablet (0.25 mg total) by mouth at bedtime as needed. Patient taking differently: Take 0.25 mg by mouth at bedtime as needed for sleep.  02/14/19  Yes Young, Tarri Fuller D, MD  vitamin E 400 UNIT capsule Take 400 Units by mouth daily.    Yes [provider]    Family History Family History  Problem Relation Age of Onset  . Prostate cancer Brother   . Heart disease Brother   . Heart disease Brother     Social History Social History   Tobacco Use  . Smoking status: Never Smoker  . Smokeless tobacco: Never Used  Substance Use Topics  . Alcohol use: No    Alcohol/week: 0.0 standard drinks  . Drug use: No     Allergies   Acetaminophen; Amlodipine; Bactrim [sulfamethoxazole-trimethoprim]; Codeine; Fexofenadine; Ibuprofen; Lovenox [enoxaparin sodium]; Naproxen; Prednisone; Shellfish allergy; and Sonata [zaleplon]   Review of Systems Review of Systems 10 Systems reviewed and are negative for acute change except as noted in the HPI.   Physical Exam Updated Vital Signs BP 134/62   Pulse 71   Temp 99.2 F (37.3 C) (Oral)   Resp 19   SpO2 95%    Physical Exam Constitutional:      Comments: Alert and appropriate clear mental status.  No respiratory distress.  HENT:     Head: Normocephalic and atraumatic.     Mouth/Throat:     Pharynx: Oropharynx is clear.  Eyes:     Extraocular Movements: Extraocular movements intact.  Cardiovascular:     Rate and Rhythm: Normal rate and regular rhythm.  Pulmonary:     Effort: Pulmonary effort is normal.     Breath sounds: Normal breath sounds.  Abdominal:     Comments: Moderate epigastric tenderness palpation.  No lower abdominal tenderness to palpation.  No mass or fullness in the inguinal canals.  Musculoskeletal: Normal range of motion.        General: No swelling or tenderness.     Right lower leg: No edema.     Left lower leg: No edema.  Skin:    General: Skin is warm and dry.  Neurological:     General: No focal deficit present.     Mental Status: He is oriented to person, place, and time.     Coordination: Coordination normal.  Psychiatric:        Mood and Affect: Mood normal.      ED Treatments / Results  Labs (all labs ordered are listed, but only abnormal results are displayed) Labs Reviewed  BASIC METABOLIC PANEL - Abnormal; Notable for the following components:      Result Value   Chloride 97 (*)    Glucose, Bld 106 (*)    All other components within normal limits  HEPATIC FUNCTION PANEL - Abnormal; Notable for the following components:   Total Bilirubin 2.2 (*)    Bilirubin, Direct 0.3 (*)    Indirect Bilirubin 1.9 (*)    All other components within normal limits  LIPASE, BLOOD - Abnormal; Notable for the following components:   Lipase 60 (*)    All other components within normal limits  SARS CORONAVIRUS 2 (HOSPITAL ORDER, [facility])  CBC  TROPONIN I  TROPONIN I    EKG EKG Interpretation  Date/Time:  Monday Apr 16 2019 17:03:13 EDT Ventricular Rate:  62 PR Interval:  248 QRS Duration: 86 QT Interval:  428 QTC  Calculation: 434 R Axis:   67 Text Interpretation:  Sinus rhythm with 1st degree A-V block Nonspecific ST abnormality Abnormal ECG mild ST depressions in anterior leads simiar to 2014 No acute changes Confirmed by Merrily Pew [phone]) on 04/16/2019 5:12:31 PM   Radiology Dg Chest 2 View  Result Date: 04/16/2019 CLINICAL DATA:  Central chest pain for the past 2 days with mild shortness of breath. EXAM: CHEST - 2 VIEW COMPARISON:  Chest x-ray dated April 04, 2015. FINDINGS: The heart size and mediastinal contours are within normal limits. Prior CABG. Normal pulmonary vascularity. No focal consolidation, pleural effusion, or pneumothorax. No acute osseous abnormality. IMPRESSION: No active cardiopulmonary disease. Electronically Signed   By: Titus Dubin M.D.   On: 04/16/2019 17:45   Ct Abdomen Pelvis W Contrast  Result Date: 04/16/2019 CLINICAL DATA:  Chest pain and nausea today. EXAM: CT ABDOMEN AND PELVIS WITH CONTRAST TECHNIQUE: Multidetector CT imaging of the abdomen and pelvis was performed using the standard protocol following bolus administration of intravenous contrast. CONTRAST:  100 mL OMNIPAQUE IOHEXOL 300 MG/ML  SOLN COMPARISON:  CT abdomen and pelvis 05/12/2013. FINDINGS: Lower chest: Calcific coronary atherosclerosis is noted. Lung bases clear. No pleural or pericardial effusion. Heart size is normal. Hepatobiliary: 2-3 punctate calcifications in the liver incidentally noted. The liver is otherwise unremarkable. Gallbladder and biliary tree appear normal. Pancreas: There is mild stranding about the head and neck of the pancreas. The pancreas enhances homogeneously and no focal fluid collection is seen. There is mild pancreatic atrophy. No mass noted. No pancreatic ductal dilatation. Spleen: Normal in size without focal abnormality. Adrenals/Urinary Tract: The adrenal glands appear normal. There is some atrophy of the left kidney. A few small renal cysts are noted. The ureters and urinary  bladder appear normal. Stomach/Bowel: The appendix has been removed. A few sigmoid diverticula are identified but there is no diverticulitis. The colon is otherwise unremarkable. Very small hiatal hernia is noted. Small bowel appears normal. Vascular/Lymphatic: Aortic atherosclerosis. No enlarged abdominal or pelvic lymph nodes. Reproductive: There is prostatomegaly. Other: Small fat containing umbilical hernia is identified. Musculoskeletal: Convex left thoracolumbar scoliosis and severe multilevel degenerative disease are seen. Right hip replacement is in place. IMPRESSION: Mild stranding about the head and neck of the pancreas most consistent with acute pancreatitis. Negative for pancreatic necrosis or pseudocyst. Calcific aortic and coronary atherosclerosis. Prostatomegaly. Mild sigmoid diverticulosis without diverticulitis. Very small fat containing umbilical hernia. Electronically Signed   By: Inge Rise M.D.   On: 04/16/2019 22:32    Procedures Procedures (including critical care time)  Medications Ordered in ED Medications  sodium chloride flush (NS) 0.9 % injection 3 mL (has no administration in time range)  sodium chloride 0.9 % bolus 250 mL (has no administration in time range)  morphine 2 MG/ML injection 2 mg (has no administration in time range)  0.9 %  sodium chloride infusion ( Intravenous New Bag/Given 04/16/19 2138)  pantoprazole (PROTONIX) injection 40 mg (40 mg Intravenous Given 04/16/19 2133)  iohexol (OMNIPAQUE) 300 MG/ML solution 100 mL (100 mLs Intravenous Contrast Given 04/16/19 2208)     Initial Impression / Assessment and Plan / ED Course  I have reviewed the triage vital signs and the nursing notes.  Pertinent labs & imaging results that were available during my care of the patient were reviewed by me and considered  in my medical decision making (see chart for details).  Clinical Course as of Apr 24 1543  Mon Apr 16, 2019  2357 Consult: Reviewed with Dr. Donna Bernard for  admission   [MP]    Clinical Course User Index [MP] Charlesetta Shanks, MD      Presents with developing central abdominal and epigastric pain with nausea.  This time findings are most consistent with GI etiology by exam and history.  CT scan obtained showing inflammatory changes at the pancreas.  Patient has mild elevation in total bili and lipase.  Does not drink alcohol.  Concern for possible gallstone pancreatitis.  Fluids started and Protonix and morphine administered.  Plan for admission.  Final Clinical Impressions(s) / ED Diagnoses   Final diagnoses:  Acute pancreatitis without infection or necrosis, unspecified pancreatitis type    ED Discharge Orders    None       Charlesetta Shanks, MD 04/16/19 1610    Charlesetta Shanks, MD 04/24/19 1544

## 2019-04-16 NOTE — ED Notes (Signed)
Pt remains at CT

## 2019-04-16 NOTE — H&P (Signed)
History and Physical    Scott [name] MWN:[id number] DOB: 1928-06-02 DOA: 04/16/2019  Referring MD/NP/PA:   PCP: Kirby Funk, MD   Patient coming from:  The patient is coming from home.  At baseline, pt is independent for most of ADL.        Chief Complaint: Epigastric abdominal pain  HPI: Scott [name] is a 85 y.o. male with medical history significant of hypertension, hyperlipidemia, GERD, BPH, LAD, CABG, OSA on CPAP, thrombocytopenia, who presents with epigastric abdominal pain.   Patient states that his epigastric abdominal pain started several days ago, which was mild initially, but has worsened since this morning.  Initially patient feels like the pain is in the lower chest, but actually he pointed to the epigastric area. The pain is constant, moderate, sharp, nonradiating.  It is associated with nausea, but no vomiting or diarrhea.  No fever or chills.  Patient denies symptoms of UTI.  He states that he has mild shortness of breath, but no chest pain.  He has chronic mild runny nose which has not changed.  ED Course: pt was found to have lipase 60, negative troponin, WBC 9.9, negative COVID-19 test, electrolytes renal function okay, temperature normal, no tachycardia, oxygen saturation 95% on room air.  Chest x-ray negative.  Patient is placed on telemetry bed for observation.  # CT-abdomen/pelvis that showed: mild stranding about the head and neck of the pancreas most consistent with acute pancreatitis. Negative for pancreatic necrosis or pseudocyst.  Review of Systems:   General: no fevers, chills, no body weight gain, has poor appetite, has fatigue HEENT: no blurry vision, hearing changes or sore throat Respiratory: no dyspnea, coughing, wheezing CV: no chest pain, no palpitations GI: has nausea, abdominal pain, no diarrhea, constipation, vomiting,  GU: no dysuria, burning on urination, increased urinary frequency, hematuria  Ext: no leg edema Neuro: no unilateral  weakness, numbness, or tingling, no vision change or hearing loss Skin: no rash, no skin tear. MSK: No muscle spasm, no deformity, no limitation of range of movement in spin Heme: No easy bruising.  Travel history: No recent long distant travel.  Allergy:  Allergies  Allergen Reactions   Acetaminophen     Other reaction(s): mouth sores   Amlodipine Swelling    Swelling of feet and legs   Bactrim [Sulfamethoxazole-Trimethoprim] Other (See Comments)    Constipation    Codeine Nausea Only   Fexofenadine     Other reaction(s): mouth sores   Ibuprofen     Other reaction(s): nervousness   Lovenox [Enoxaparin Sodium] Other (See Comments)    MAY HAVE CAUSED THROMBOCYTOPENIA   Naproxen     Other reaction(s): mouth sores   Prednisone Swelling    Patient has tolerated doses for colds. Thinks it caused swelling in the past   Shellfish Allergy Nausea And Vomiting and Other (See Comments)    N&V, shaking   Sonata [Zaleplon] Other (See Comments)    Hyper feelings    Past Medical History:  Diagnosis Date   Anemia    Acute blood loss anemia secondary to surgery, followed fr/ Hip replacement    Anxiety    pt. reports that he has occas. nervous episodes    BPH (benign prostatic hyperplasia)    CAD (coronary artery disease)    s/p CABG in 2007  CARDILOLOGIST IS DR. P. [place]   Cancer (HCC)    melanoma - removed L ear - 2010   Cranial neuropathy    resolved    Generalized OA  GERD (gastroesophageal reflux disease)    H/O echocardiogram    last echo, stress test 05/2012- pt. followed by [facility]    Hypercholesterolemia    Hypertension    Obesity    Osteoarthritis    End-stage osteoarthritis, right hip   Sleep apnea    with use of CPAP   SOB (shortness of breath)    WITH EXERTION   Thrombocytopenia (HCC)    possibly due to Lovenox    Past Surgical History:  Procedure Laterality Date   ACHILLES TENDON SURGERY  10/06/2012   Procedure: ACHILLES  TENDON REPAIR;  Surgeon: Thera Flake., MD;  Location: [facility] OR;  Service: Orthopedics;  Laterality: Right;  REPAIR RIGHT RUPTURE ACHILLES TENDON PRIMARY OPEN/PERCUTANEOUS, EXCISION PARTIAL BONE TALUS/CANCANEUS, REPAIR PARTIAL EXCISION CALCANEUS   APPENDECTOMY     ARTERY BIOPSY     temporal - L side- wnl, double vision treated /w prednisone - 1991   CALCANEAL OSTEOTOMY  10/06/2012   Procedure: CALCANEAL OSTEOTOMY;  Surgeon: Thera Flake., MD;  Location: [facility] OR;  Service: Orthopedics;  Laterality: Right;   CARDIAC CATHETERIZATION  12/15/2005   NORMAL. EF 60%, angioplasty 1995   carpel tunnel surgery-bilateral     both hands    CORONARY ARTERY BYPASS GRAFT  2007   LIMA GRAFT TO THE LAD, SAPHENOUS VEIN GRAFT TO THE FIRST OBTUSE MARIGNAL, SAPHENOUS VEIN GRAFT TO THE SECOND OBTUSE MARGINAL AND A SAPHENOUS VEIN GRAFT TO THE ACUTE MARGINAL AND POSTERIOR LATERAL BRANCH   HERNIA REPAIR     I&D EXTREMITY  12/11/2012   Procedure: IRRIGATION AND DEBRIDEMENT EXTREMITY;  Surgeon: Nadara Mustard, MD;  Location: [facility] OR;  Service: Orthopedics;  Laterality: Right;  Irrigation and Debridement achilles, wound closure, apply wound vac, place antibiotic beads   INGUINAL HERNIA REPAIR Left 05/10/2013   Procedure: HERNIA REPAIR INGUINAL ADULT;  Surgeon: Velora Heckler, MD;  Location: [facility];  Service: General;  Laterality: Left;   INSERTION OF MESH Left 05/10/2013   Procedure: INSERTION OF MESH;  Surgeon: Velora Heckler, MD;  Location: [facility];  Service: General;  Laterality: Left;   JOINT REPLACEMENT     knee  11/12   left arthroscopic   TOTAL HIP ARTHROPLASTY     right    Social History:  reports that he has never smoked. He has never used smokeless tobacco. He reports that he does not drink alcohol or use drugs.  Family History:  Family History  Problem Relation Age of Onset   Prostate cancer Brother    Heart disease Brother    Heart disease Brother      Prior to Admission medications   Medication  Sig Start Date End Date Taking? Authorizing Provider  aspirin 81 MG tablet Take 81 mg by mouth every evening.    Yes [provider]  finasteride (PROSCAR) 5 MG tablet Take 5 mg by mouth daily before breakfast.    Yes [provider]  fish oil-omega-3 fatty acids 1000 MG capsule Take 1 g by mouth every other day.    Yes [provider]  lovastatin (MEVACOR) 20 MG tablet Take 20 mg by mouth at bedtime.    Yes [provider]  metoprolol (LOPRESSOR) 50 MG tablet Take 25 mg by mouth 2 (two) times daily.   Yes [provider]  potassium chloride (K-DUR,KLOR-CON) 10 MEQ tablet Take 10 mEq by mouth every evening.    Yes [provider]  Suvorexant (BELSOMRA) 15 MG TABS Take 1  tablet by mouth at bedtime as needed. Patient taking differently: Take 1 tablet by mouth at bedtime as needed (insomnia).  01/09/19  Yes Young, Joni Fears D, MD  Tamsulosin HCl (FLOMAX) 0.4 MG CAPS Take 0.4 mg by mouth daily after supper.   Yes [provider]  traZODone (DESYREL) 50 MG tablet Take 1 tablet (50 mg total) by mouth at bedtime. 12/03/14  Yes Young, Joni Fears D, MD  triamterene-hydrochlorothiazide (MAXZIDE) 75-50 MG per tablet Take 1 tablet by mouth daily before breakfast.    Yes [provider]  triazolam (HALCION) 0.25 MG tablet Take 1 tablet (0.25 mg total) by mouth at bedtime as needed. Patient taking differently: Take 0.25 mg by mouth at bedtime as needed for sleep.  02/14/19  Yes Young, Joni Fears D, MD  vitamin E 400 UNIT capsule Take 400 Units by mouth daily.    Yes [provider]    Physical Exam: Vitals:   04/17/19 0215 04/17/19 0217 04/17/19 0230 04/17/19 0328  BP:  125/87 125/65 (!) 155/74  Pulse: 83 91 84 81  Resp: (!) 23 (!) 21 (!) 23 18  Temp:    98.8 F (37.1 C)  TempSrc:    Oral  SpO2: 96% 94% 95% 97%  Weight:    78.1 kg  Height:    5\' 6"  (1.676 m)   General: Not in acute distress HEENT:       Eyes: PERRL, EOMI, no  scleral icterus.       ENT: No discharge from the ears and nose, no pharynx injection, no tonsillar enlargement.        Neck: No JVD, no bruit, no mass felt. Heme: No neck lymph node enlargement. Cardiac: S1/S2, RRR, No murmurs, No gallops or rubs. Respiratory: No rales, wheezing, rhonchi or rubs. GI: Soft, nondistended, has tenderness in epigastric area, no rebound pain, no organomegaly, BS present. GU: No hematuria Ext: No pitting leg edema bilaterally. 2+DP/PT pulse bilaterally. Musculoskeletal: No joint deformities, No joint redness or warmth, no limitation of ROM in spin. Skin: No rashes.  Neuro: Alert, oriented X3, cranial nerves II-XII grossly intact, moves all extremities normally. Psych: Patient is not psychotic, no suicidal or hemocidal ideation.  Labs on Admission: I have personally reviewed following labs and imaging studies  CBC: Recent Labs  Lab 04/16/19 1724  WBC 9.9  HGB 14.2  HCT 42.4  MCV 96.6  PLT PLATELET CLUMPS NOTED ON SMEAR, COUNT APPEARS DECREASED   Basic Metabolic Panel: Recent Labs  Lab 04/16/19 1724  NA 140  K 3.5  CL 97*  CO2 28  GLUCOSE 106*  BUN 11  CREATININE 1.01  CALCIUM 9.2   GFR: Estimated Creatinine Clearance: 49.7 mL/min (by C-G formula based on SCr of 1.01 mg/dL). Liver Function Tests: Recent Labs  Lab 04/16/19 2124  AST 19  ALT 15  ALKPHOS 70  BILITOT 2.2*  PROT 6.6  ALBUMIN 3.8   Recent Labs  Lab 04/16/19 2124  LIPASE 60*   No results for input(s): AMMONIA in the last 168 hours. Coagulation Profile: No results for input(s): INR, PROTIME in the last 168 hours. Cardiac Enzymes: Recent Labs  Lab 04/16/19 1724 04/16/19 2124  TROPONINI <0.03 <0.03   BNP (last 3 results) No results for input(s): PROBNP in the last 8760 hours. HbA1C: No results for input(s): HGBA1C in the last 72 hours. CBG: No results for input(s): GLUCAP in the last 168 hours. Lipid Profile: No results for input(s): CHOL, HDL, LDLCALC, TRIG,  CHOLHDL, LDLDIRECT in the  last 72 hours. Thyroid Function Tests: No results for input(s): TSH, T4TOTAL, FREET4, T3FREE, THYROIDAB in the last 72 hours. Anemia Panel: No results for input(s): VITAMINB12, FOLATE, FERRITIN, TIBC, IRON, RETICCTPCT in the last 72 hours. Urine analysis: No results found for: COLORURINE, APPEARANCEUR, LABSPEC, PHURINE, GLUCOSEU, HGBUR, BILIRUBINUR, KETONESUR, PROTEINUR, UROBILINOGEN, NITRITE, LEUKOCYTESUR Sepsis Labs: @LABRCNTIP (procalcitonin:4,lacticidven:4) ) Recent Results (from the past 240 hour(s))  SARS Coronavirus 2 [facility] order, Performed in [facility] hospital lab)     Status: None   Collection Time: 04/16/19 11:16 PM  Result Value Ref Range Status   SARS Coronavirus 2 NEGATIVE NEGATIVE Final    Comment: (NOTE) If result is NEGATIVE SARS-CoV-2 target nucleic acids are NOT DETECTED. The SARS-CoV-2 RNA is generally detectable in upper and lower  respiratory specimens during the acute phase of infection. The lowest  concentration of SARS-CoV-2 viral copies this assay can detect is 250  copies / mL. A negative result does not preclude SARS-CoV-2 infection  and should not be used as the sole basis for treatment or other  patient management decisions.  A negative result may occur with  improper specimen collection / handling, submission of specimen other  than nasopharyngeal swab, presence of viral mutation(s) within the  areas targeted by this assay, and inadequate number of viral copies  (<250 copies / mL). A negative result must be combined with clinical  observations, patient history, and epidemiological information. If result is POSITIVE SARS-CoV-2 target nucleic acids are DETECTED. The SARS-CoV-2 RNA is generally detectable in upper and lower  respiratory specimens dur ing the acute phase of infection.  Positive  results are indicative of active infection with SARS-CoV-2.  Clinical  correlation with patient history and other diagnostic  information is  necessary to determine patient infection status.  Positive results do  not rule out bacterial infection or co-infection with other viruses. If result is PRESUMPTIVE POSTIVE SARS-CoV-2 nucleic acids MAY BE PRESENT.   A presumptive positive result was obtained on the submitted specimen  and confirmed on repeat testing.  While 2019 novel coronavirus  (SARS-CoV-2) nucleic acids may be present in the submitted sample  additional confirmatory testing may be necessary for epidemiological  and / or clinical management purposes  to differentiate between  SARS-CoV-2 and other Sarbecovirus currently known to infect humans.  If clinically indicated additional testing with an alternate test  methodology [phone]) is advised. The SARS-CoV-2 RNA is generally  detectable in upper and lower respiratory sp ecimens during the acute  phase of infection. The expected result is Negative. Fact Sheet for Patients:  BoilerBrush.com.cy Fact Sheet for Healthcare Providers: https://pope.com/ This test is not yet approved or cleared by the [place] FDA and has been authorized for detection and/or diagnosis of SARS-CoV-2 by FDA under an Emergency Use Authorization (EUA).  This EUA will remain in effect (meaning this test can be used) for the duration of the COVID-19 declaration under Section 564(b)(1) of the Act, 21 U.S.C. section 360bbb-3(b)(1), unless the authorization is terminated or revoked sooner. Performed at [facility], [address], [place], [place] [address]      Radiological Exams on Admission: Dg Chest 2 View  Result Date: 04/16/2019 CLINICAL DATA:  Central chest pain for the past 2 days with mild shortness of breath. EXAM: CHEST - 2 VIEW COMPARISON:  Chest x-ray dated April 04, 2015. FINDINGS: The heart size and mediastinal contours are within normal limits. Prior CABG. Normal pulmonary vascularity. No focal  consolidation, pleural effusion, or pneumothorax. No  acute osseous abnormality. IMPRESSION: No active cardiopulmonary disease. Electronically Signed   By: Obie Dredge M.D.   On: 04/16/2019 17:45   Ct Abdomen Pelvis W Contrast  Result Date: 04/16/2019 CLINICAL DATA:  Chest pain and nausea today. EXAM: CT ABDOMEN AND PELVIS WITH CONTRAST TECHNIQUE: Multidetector CT imaging of the abdomen and pelvis was performed using the standard protocol following bolus administration of intravenous contrast. CONTRAST:  100 mL OMNIPAQUE IOHEXOL 300 MG/ML  SOLN COMPARISON:  CT abdomen and pelvis 05/12/2013. FINDINGS: Lower chest: Calcific coronary atherosclerosis is noted. Lung bases clear. No pleural or pericardial effusion. Heart size is normal. Hepatobiliary: 2-3 punctate calcifications in the liver incidentally noted. The liver is otherwise unremarkable. Gallbladder and biliary tree appear normal. Pancreas: There is mild stranding about the head and neck of the pancreas. The pancreas enhances homogeneously and no focal fluid collection is seen. There is mild pancreatic atrophy. No mass noted. No pancreatic ductal dilatation. Spleen: Normal in size without focal abnormality. Adrenals/Urinary Tract: The adrenal glands appear normal. There is some atrophy of the left kidney. A few small renal cysts are noted. The ureters and urinary bladder appear normal. Stomach/Bowel: The appendix has been removed. A few sigmoid diverticula are identified but there is no diverticulitis. The colon is otherwise unremarkable. Very small hiatal hernia is noted. Small bowel appears normal. Vascular/Lymphatic: Aortic atherosclerosis. No enlarged abdominal or pelvic lymph nodes. Reproductive: There is prostatomegaly. Other: Small fat containing umbilical hernia is identified. Musculoskeletal: Convex left thoracolumbar scoliosis and severe multilevel degenerative disease are seen. Right hip replacement is in place. IMPRESSION: Mild stranding about  the head and neck of the pancreas most consistent with acute pancreatitis. Negative for pancreatic necrosis or pseudocyst. Calcific aortic and coronary atherosclerosis. Prostatomegaly. Mild sigmoid diverticulosis without diverticulitis. Very small fat containing umbilical hernia. Electronically Signed   By: Drusilla Kanner M.D.   On: 04/16/2019 22:32   US Abdomen Limited Ruq  Result Date: 04/17/2019 CLINICAL DATA:  Acute pancreatitis EXAM: ULTRASOUND ABDOMEN LIMITED RIGHT UPPER QUADRANT COMPARISON:  None. FINDINGS: Gallbladder: No gallstones or wall thickening visualized. No sonographic Murphy sign noted by sonographer. Common bile duct: Diameter: 4 mm Liver: No focal lesion identified. Within normal limits in parenchymal echogenicity. Portal vein is patent on color Doppler imaging with normal direction of blood flow towards the liver. IMPRESSION: No cholelithiasis or other evidence acute cholecystitis. Electronically Signed   By: Deatra Robinson M.D.   On: 04/17/2019 03:28     EKG: Independently reviewed.  Sinus rhythm, QTC 411, early R wave progression, nonspecific T wave change.  Assessment/Plan Principal Problem:   Acute pancreatitis Active Problems:   Obstructive sleep apnea   Hyperlipidemia   Thrombocytopenia (HCC)   Hypertension   CAD (coronary artery disease)   BPH (benign prostatic hyperplasia)   Acute pancreatitis: Etiology is not clear.  Patient denies drinking alcohol. Lipase is only 60, but CT scan findings is consistent with acute pancreatitis.  -will place in tele bed for observation -NPO for pancreatitis -IVF: 250 cc NS in ED and then at 125 cc/hr -prn IV morphine for pain control -prn IV zofran for nausea --US-RUQ-->negative -check triglyceride level-->56 -trop x 3 just in case  Obstructive sleep apnea: -CPAP  Hyperlipidemia: -statin  Thrombocytopenia (HCC): pending platet[place] number -f/u by CBC  Hypertension: --hold maxide since pt need IVF -Continue  metoprolol -IV hydralazine as needed  Hx of CAD (coronary artery disease): s/p of CBAG.  -continue ASA, statin and metoprolol - Follow-up troponin x3 just in  case  BPH: stable - Continue Flomax and Proscar    DVT ppx: SCD Code Status: Full code Family Communication: None at bed side.  Disposition Plan:  Anticipate discharge back to previous home environment Consults called:  none Admission status: Obs / tele    Date of Service 04/17/2019    Lorretta Harp [facility]   If 7PM-7AM, please contact night-coverage [email or web address] Password [facility] 04/17/2019, 4:26 AM

## 2019-04-16 NOTE — ED Triage Notes (Signed)
Pt reports central chest pain this morning with nausea, took some aspirin and the pain got better, pain came back around 1pm and pt took another aspirin without any relief. Extensive cardiac hx. Pt a.o, nad

## 2019-04-16 NOTE — Telephone Encounter (Signed)
Spoke with pt who state he is currently having chest pain, nauseated, and feel like he's going to pass out. Advised pt he need to report to ED for further evaluations. Pt voiced understanding.

## 2019-04-16 NOTE — Telephone Encounter (Signed)
New Message   Pt c/o of Chest Pain: STAT if CP now or developed within 24 hours  1. Are you having CP right now? Yes, feels like he's going to pass out.   2. Are you experiencing any other symptoms (ex. SOB, nausea, vomiting, sweating)? Nausea  3. How long have you been experiencing CP? For a few hours   4. Is your CP continuous or coming and going? Continuous   5. Have you taken Nitroglycerin? No ?

## 2019-04-17 ENCOUNTER — Observation Stay (HOSPITAL_COMMUNITY): Payer: Medicare HMO

## 2019-04-17 ENCOUNTER — Encounter (HOSPITAL_COMMUNITY): Payer: Self-pay | Admitting: *Deleted

## 2019-04-17 DIAGNOSIS — K8500 Idiopathic acute pancreatitis without necrosis or infection: Secondary | ICD-10-CM | POA: Diagnosis not present

## 2019-04-17 DIAGNOSIS — K859 Acute pancreatitis without necrosis or infection, unspecified: Secondary | ICD-10-CM

## 2019-04-17 HISTORY — DX: Acute pancreatitis without necrosis or infection, unspecified: K85.90

## 2019-04-17 LAB — TROPONIN I
Troponin I: <0.03 ng/mL (ref <0.03)
Troponin I: <0.03 ng/mL (ref <0.03)
Troponin I: <0.03 ng/mL (ref <0.03)

## 2019-04-17 LAB — SARS CORONAVIRUS 2 BY RT PCR (HOSPITAL ORDER, PERFORMED IN ~~LOC~~ HOSPITAL LAB): SARS Coronavirus 2: NEGATIVE

## 2019-04-17 LAB — TRIGLYCERIDES: Triglycerides: 56 mg/dL (ref <150)

## 2019-04-17 MED ORDER — ORAL CARE MOUTH RINSE
15.0000 mL | Freq: Two times a day (BID) | OROMUCOSAL | Status: DC
  Administered 2019-04-17 (×2): 15 mL via OROMUCOSAL

## 2019-04-17 MED ORDER — SUVOREXANT 15 MG PO TABS: 1.0000 | ORAL_TABLET | Freq: Every evening | ORAL | Status: DC | PRN

## 2019-04-17 MED ORDER — TRIAZOLAM 0.125 MG PO TABS: 0.2500 mg | ORAL_TABLET | Freq: Every evening | ORAL | Status: DC | PRN

## 2019-04-17 MED ORDER — ONDANSETRON HCL 4 MG PO TABS: 4.0000 mg | ORAL_TABLET | Freq: Four times a day (QID) | ORAL | Status: DC | PRN

## 2019-04-17 MED ORDER — FINASTERIDE 5 MG PO TABS
5.0000 mg | ORAL_TABLET | Freq: Every day | ORAL | Status: DC
  Administered 2019-04-17 – 2019-04-18 (×2): 5 mg via ORAL
  Filled 2019-04-17 (×2): qty 1

## 2019-04-17 MED ORDER — PANTOPRAZOLE SODIUM 40 MG IV SOLR
40.0000 mg | Freq: Two times a day (BID) | INTRAVENOUS | Status: DC
  Administered 2019-04-17 – 2019-04-18 (×3): 40 mg via INTRAVENOUS
  Filled 2019-04-17 (×3): qty 40

## 2019-04-17 MED ORDER — OMEGA-3-ACID ETHYL ESTERS 1 G PO CAPS
1.0000 g | ORAL_CAPSULE | ORAL | Status: DC
  Administered 2019-04-17: 1 g via ORAL
  Filled 2019-04-17: qty 1

## 2019-04-17 MED ORDER — METOPROLOL TARTRATE 25 MG PO TABS
25.0000 mg | ORAL_TABLET | Freq: Two times a day (BID) | ORAL | Status: DC
  Administered 2019-04-17 – 2019-04-18 (×4): 25 mg via ORAL
  Filled 2019-04-17 (×4): qty 1

## 2019-04-17 MED ORDER — TAMSULOSIN HCL 0.4 MG PO CAPS
0.4000 mg | ORAL_CAPSULE | Freq: Every day | ORAL | Status: DC
  Administered 2019-04-17: 0.4 mg via ORAL
  Filled 2019-04-17: qty 1

## 2019-04-17 MED ORDER — HYDRALAZINE HCL 20 MG/ML IJ SOLN: 5.0000 mg | INTRAMUSCULAR | Status: DC | PRN

## 2019-04-17 MED ORDER — VITAMIN E 180 MG (400 UNIT) PO CAPS
400.0000 [IU] | ORAL_CAPSULE | Freq: Every day | ORAL | Status: DC
  Administered 2019-04-17: 400 [IU] via ORAL
  Filled 2019-04-17: qty 1

## 2019-04-17 MED ORDER — ASPIRIN EC 81 MG PO TBEC
81.0000 mg | DELAYED_RELEASE_TABLET | Freq: Every evening | ORAL | Status: DC
  Administered 2019-04-17: 81 mg via ORAL
  Filled 2019-04-17: qty 1

## 2019-04-17 MED ORDER — MORPHINE SULFATE (PF) 2 MG/ML IV SOLN
1.0000 mg | INTRAVENOUS | Status: DC | PRN
  Administered 2019-04-17 (×2): 1 mg via INTRAVENOUS
  Filled 2019-04-17 (×2): qty 1

## 2019-04-17 MED ORDER — PRAVASTATIN SODIUM 10 MG PO TABS: 20.0000 mg | ORAL_TABLET | Freq: Every day | ORAL | Status: DC

## 2019-04-17 MED ORDER — TRAZODONE HCL 50 MG PO TABS
50.0000 mg | ORAL_TABLET | Freq: Every day | ORAL | Status: DC
  Administered 2019-04-17 (×2): 50 mg via ORAL
  Filled 2019-04-17 (×2): qty 1

## 2019-04-17 MED ORDER — CHLORHEXIDINE GLUCONATE 0.12 % MT SOLN
15.0000 mL | Freq: Two times a day (BID) | OROMUCOSAL | Status: DC
  Administered 2019-04-17 – 2019-04-18 (×3): 15 mL via OROMUCOSAL
  Filled 2019-04-17 (×3): qty 15

## 2019-04-17 MED ORDER — SODIUM CHLORIDE 0.9 % IV SOLN
INTRAVENOUS | Status: DC
  Administered 2019-04-17 – 2019-04-18 (×3): via INTRAVENOUS

## 2019-04-17 MED ORDER — ONDANSETRON HCL 4 MG/2ML IJ SOLN: 4.0000 mg | Freq: Four times a day (QID) | INTRAMUSCULAR | Status: DC | PRN

## 2019-04-17 NOTE — Progress Notes (Signed)
Patient arrived to unit alert and oriented x4 on skin assessment red area on right flank area patient stated that it was a tick bite from 2 weeks ago. Will continue to monitor. Arthor Captain LPN

## 2019-04-17 NOTE — ED Notes (Signed)
ED TO INPATIENT HANDOFF REPORT  ED Nurse Name and Phone #:  Kel[place] Fam [phone]  S Name/Age/Gender Scott [name] 85 y.o. male Room/Bed: 026C/026C  Code Status   Code Status: Full Code  Home/SNF/Other Home Patient oriented to: self, place, time and situation Is this baseline? Yes   Triage Complete: Triage complete  Chief Complaint CP, Nausea  Triage Note Pt reports central chest pain this morning with nausea, took some aspirin and the pain got better, pain came back around 1pm and pt took another aspirin without any relief. Extensive cardiac hx. Pt a.o, nad     Allergies Allergies  Allergen Reactions  . Acetaminophen     Other reaction(s): mouth sores  . Amlodipine Swelling    Swelling of feet and legs  . Bactrim [Sulfamethoxazole-Trimethoprim] Other (See Comments)    Constipation   . Codeine Nausea Only  . Fexofenadine     Other reaction(s): mouth sores  . Ibuprofen     Other reaction(s): nervousness  . Lovenox [Enoxaparin Sodium] Other (See Comments)    MAY HAVE CAUSED THROMBOCYTOPENIA  . Naproxen     Other reaction(s): mouth sores  . Prednisone Swelling    Patient has tolerated doses for colds. Thinks it caused swelling in the past  . Shellfish Allergy Nausea And Vomiting and Other (See Comments)    N&V, shaking  . Sonata [Zaleplon] Other (See Comments)    Hyper feelings    Level of Care/Admitting Diagnosis ED Disposition    ED Disposition Condition [facility] Area: [place] [[id number]]  Level of Care: Telemetry Cardiac [103]  I expect the patient will be discharged within 24 hours: No (not a candidate for 5C-Observation unit)  Covid Evaluation: N/A  Diagnosis: Acute pancreatitis [577.0.ICD-9-CM]  Admitting Physician: Ivor Costa [[id number]]  Attending Physician: Ivor Costa [[id number]]  PT Class (Do Not Modify): Observation [104]  PT Acc Code (Do Not Modify): Observation [10022]       B Medical/Surgery History Past  Medical History:  Diagnosis Date  . Anemia    Acute blood loss anemia secondary to surgery, followed fr/ Hip replacement   . Anxiety    pt. reports that he has occas. nervous episodes   . BPH (benign prostatic hyperplasia)   . CAD (coronary artery disease)    s/p CABG in 2007  CARDILOLOGIST IS DR. P. [place]  . Cancer ([place])    melanoma - removed L ear - 2010  . Cranial neuropathy    resolved   . Generalized OA   . GERD (gastroesophageal reflux disease)   . H/O echocardiogram    last echo, stress test 05/2012- pt. followed by [facility]   . Hypercholesterolemia   . Hypertension   . Obesity   . Osteoarthritis    End-stage osteoarthritis, right hip  . Sleep apnea    with use of CPAP  . SOB (shortness of breath)    WITH EXERTION  . Thrombocytopenia ([place])    possibly due to Lovenox   Past Surgical History:  Procedure Laterality Date  . ACHILLES TENDON SURGERY  10/06/2012   Procedure: ACHILLES TENDON REPAIR;  Surgeon: Yvette Rack., MD;  Location: [facility];  Service: Orthopedics;  Laterality: Right;  REPAIR RIGHT RUPTURE ACHILLES TENDON PRIMARY OPEN/PERCUTANEOUS, EXCISION PARTIAL BONE TALUS/CANCANEUS, REPAIR PARTIAL EXCISION CALCANEUS  . APPENDECTOMY    . ARTERY BIOPSY     temporal - L side- wnl, double vision treated /w prednisone - 1991  . CALCANEAL OSTEOTOMY  10/06/2012   Procedure: CALCANEAL OSTEOTOMY;  Surgeon: Yvette Rack., MD;  Location: [facility];  Service: Orthopedics;  Laterality: Right;  . CARDIAC CATHETERIZATION  12/15/2005   NORMAL. EF 60%, angioplasty 1995  . carpel tunnel surgery-bilateral     both hands   . CORONARY ARTERY BYPASS GRAFT  2007   LIMA GRAFT TO THE LAD, SAPHENOUS VEIN GRAFT TO THE FIRST OBTUSE MARIGNAL, SAPHENOUS VEIN GRAFT TO THE SECOND OBTUSE MARGINAL AND A SAPHENOUS VEIN GRAFT TO THE ACUTE MARGINAL AND POSTERIOR LATERAL BRANCH  . HERNIA REPAIR    . I&D EXTREMITY  12/11/2012   Procedure: IRRIGATION AND DEBRIDEMENT EXTREMITY;  Surgeon: Newt Minion, MD;   Location: [facility];  Service: Orthopedics;  Laterality: Right;  Irrigation and Debridement achilles, wound closure, apply wound vac, place antibiotic beads  . INGUINAL HERNIA REPAIR Left 05/10/2013   Procedure: HERNIA REPAIR INGUINAL ADULT;  Surgeon: Earnstine Regal, MD;  Location: [facility];  Service: General;  Laterality: Left;  . INSERTION OF MESH Left 05/10/2013   Procedure: INSERTION OF MESH;  Surgeon: Earnstine Regal, MD;  Location: [facility];  Service: General;  Laterality: Left;  . JOINT REPLACEMENT    . knee  11/12   left arthroscopic  . TOTAL HIP ARTHROPLASTY     right     A IV Location/Drains/Wounds Patient Lines/Drains/Airways Status   Active Line/Drains/Airways    Name:   Placement date:   Placement time:   Site:   Days:   Peripheral IV 04/16/19 Right Antecubital   04/16/19    2125    Antecubital   1   Incision 10/06/12 Leg Right   10/06/12    0820     2384   Incision 12/11/12 Ankle Right   12/11/12    1207     2318   Incision 05/10/13 Abdomen Left   05/10/13    0835     2168          Intake/Output Last 24 hours  Intake/Output Summary (Last 24 hours) at 04/17/2019 0233 Last data filed at 04/16/2019 2342 Gross per 24 hour  Intake 214.46 ml  Output -  Net 214.46 ml    Labs/Imaging Results for orders placed or performed during the hospital encounter of 04/16/19 (from the past 48 hour(s))  Basic metabolic panel     Status: Abnormal   Collection Time: 04/16/19  5:24 PM  Result Value Ref Range   Sodium 140 135 - 145 mmol/L   Potassium 3.5 3.5 - 5.1 mmol/L   Chloride 97 (L) 98 - 111 mmol/L   CO2 28 22 - 32 mmol/L   Glucose, Bld 106 (H) 70 - 99 mg/dL   BUN 11 8 - 23 mg/dL   Creatinine, Ser 1.01 0.61 - 1.24 mg/dL   Calcium 9.2 8.9 - 10.3 mg/dL   GFR calc non Af Amer >60 >60 mL/min   GFR calc Af Amer >60 >60 mL/min   Anion gap 15 5 - 15    Comment: Performed at [facility], [place] [address] [place] [address]  CBC     Status: None   Collection Time: 04/16/19  5:24  PM  Result Value Ref Range   WBC 9.9 4.0 - 10.5 K/uL   RBC 4.39 4.22 - 5.81 MIL/uL   Hemoglobin 14.2 13.0 - 17.0 g/dL   HCT 42.4 39.0 - 52.0 %   MCV 96.6 80.0 - 100.0 fL   MCH 32.3 26.0 - 34.0  pg   MCHC 33.5 30.0 - 36.0 g/dL   RDW 12.8 11.5 - 15.5 %   Platelets  150 - 400 K/uL    PLATELET CLUMPS NOTED ON SMEAR, COUNT APPEARS DECREASED    Comment: Immature Platelet Fraction may be clinically indicated, consider ordering this additional test [id number]    nRBC 0.0 0.0 - 0.2 %    Comment: Performed at [facility] [address], [place], [place] [address]  Troponin I - ONCE - STAT     Status: None   Collection Time: 04/16/19  5:24 PM  Result Value Ref Range   Troponin I <0.03 <0.03 ng/mL    Comment: Performed at [facility], [place] [address] [place], [place] [address]  Hepatic function panel     Status: Abnormal   Collection Time: 04/16/19  9:24 PM  Result Value Ref Range   Total Protein 6.6 6.5 - 8.1 g/dL   Albumin 3.8 3.5 - 5.0 g/dL   AST 19 15 - 41 U/L   ALT 15 0 - 44 U/L   Alkaline Phosphatase 70 38 - 126 U/L   Total Bilirubin 2.2 (H) 0.3 - 1.2 mg/dL   Bilirubin, Direct 0.3 (H) 0.0 - 0.2 mg/dL   Indirect Bilirubin 1.9 (H) 0.3 - 0.9 mg/dL    Comment: Performed at [facility] [address], [place], [place] [address]  Lipase, blood     Status: Abnormal   Collection Time: 04/16/19  9:24 PM  Result Value Ref Range   Lipase 60 (H) 11 - 51 U/L    Comment: Performed at [facility], [place] [address] [place], [place] [address]  Troponin I - Once     Status: None   Collection Time: 04/16/19  9:24 PM  Result Value Ref Range   Troponin I <0.03 <0.03 ng/mL    Comment: Performed at [facility] [address] [place], [place] [address]  SARS Coronavirus 2 [facility] order, Performed in [facility] hospital lab)     Status: None   Collection Time: 04/16/19 11:16 PM  Result Value Ref Range   SARS Coronavirus 2 NEGATIVE NEGATIVE    Comment:  (NOTE) If result is NEGATIVE SARS-CoV-2 target nucleic acids are NOT DETECTED. The SARS-CoV-2 RNA is generally detectable in upper and lower  respiratory specimens during the acute phase of infection. The lowest  concentration of SARS-CoV-2 viral copies this assay can detect is 250  copies / mL. A negative result does not preclude SARS-CoV-2 infection  and should not be used as the sole basis for treatment or other  patient management decisions.  A negative result may occur with  improper specimen collection / handling, submission of specimen other  than nasopharyngeal swab, presence of viral mutation(s) within the  areas targeted by this assay, and inadequate number of viral copies  (<250 copies / mL). A negative result must be combined with clinical  observations, patient history, and epidemiological information. If result is POSITIVE SARS-CoV-2 target nucleic acids are DETECTED. The SARS-CoV-2 RNA is generally detectable in upper and lower  respiratory specimens dur ing the acute phase of infection.  Positive  results are indicative of active infection with SARS-CoV-2.  Clinical  correlation with patient history and other diagnostic information is  necessary to determine patient infection status.  Positive results do  not rule out bacterial infection or co-infection with other viruses. If result is PRESUMPTIVE POSTIVE SARS-CoV-2 nucleic acids MAY BE PRESENT.  A presumptive positive result was obtained on the submitted specimen  and confirmed on repeat testing.  While 2019 novel coronavirus  (SARS-CoV-2) nucleic acids may be present in the submitted sample  additional confirmatory testing may be necessary for epidemiological  and / or clinical management purposes  to differentiate between  SARS-CoV-2 and other Sarbecovirus currently known to infect humans.  If clinically indicated additional testing with an alternate test  methodology [phone]) is advised. The SARS-CoV-2 RNA is  generally  detectable in upper and lower respiratory sp ecimens during the acute  phase of infection. The expected result is Negative. Fact Sheet for Patients:  StrictlyIdeas.no Fact Sheet for Healthcare Providers: BankingDealers.co.za This test is not yet approved or cleared by the [place] FDA and has been authorized for detection and/or diagnosis of SARS-CoV-2 by FDA under an Emergency Use Authorization (EUA).  This EUA will remain in effect (meaning this test can be used) for the duration of the COVID-19 declaration under Section 564(b)(1) of the Act, 21 U.S.C. section 360bbb-3(b)(1), unless the authorization is terminated or revoked sooner. Performed at [facility], [place] [address] [place], [place] [address]    Dg Chest 2 View  Result Date: 04/16/2019 CLINICAL DATA:  Central chest pain for the past 2 days with mild shortness of breath. EXAM: CHEST - 2 VIEW COMPARISON:  Chest x-ray dated April 04, 2015. FINDINGS: The heart size and mediastinal contours are within normal limits. Prior CABG. Normal pulmonary vascularity. No focal consolidation, pleural effusion, or pneumothorax. No acute osseous abnormality. IMPRESSION: No active cardiopulmonary disease. Electronically Signed   By: Titus Dubin M.D.   On: 04/16/2019 17:45   Ct Abdomen Pelvis W Contrast  Result Date: 04/16/2019 CLINICAL DATA:  Chest pain and nausea today. EXAM: CT ABDOMEN AND PELVIS WITH CONTRAST TECHNIQUE: Multidetector CT imaging of the abdomen and pelvis was performed using the standard protocol following bolus administration of intravenous contrast. CONTRAST:  100 mL OMNIPAQUE IOHEXOL 300 MG/ML  SOLN COMPARISON:  CT abdomen and pelvis 05/12/2013. FINDINGS: Lower chest: Calcific coronary atherosclerosis is noted. Lung bases clear. No pleural or pericardial effusion. Heart size is normal. Hepatobiliary: 2-3 punctate calcifications in the liver incidentally  noted. The liver is otherwise unremarkable. Gallbladder and biliary tree appear normal. Pancreas: There is mild stranding about the head and neck of the pancreas. The pancreas enhances homogeneously and no focal fluid collection is seen. There is mild pancreatic atrophy. No mass noted. No pancreatic ductal dilatation. Spleen: Normal in size without focal abnormality. Adrenals/Urinary Tract: The adrenal glands appear normal. There is some atrophy of the left kidney. A few small renal cysts are noted. The ureters and urinary bladder appear normal. Stomach/Bowel: The appendix has been removed. A few sigmoid diverticula are identified but there is no diverticulitis. The colon is otherwise unremarkable. Very small hiatal hernia is noted. Small bowel appears normal. Vascular/Lymphatic: Aortic atherosclerosis. No enlarged abdominal or pelvic lymph nodes. Reproductive: There is prostatomegaly. Other: Small fat containing umbilical hernia is identified. Musculoskeletal: Convex left thoracolumbar scoliosis and severe multilevel degenerative disease are seen. Right hip replacement is in place. IMPRESSION: Mild stranding about the head and neck of the pancreas most consistent with acute pancreatitis. Negative for pancreatic necrosis or pseudocyst. Calcific aortic and coronary atherosclerosis. Prostatomegaly. Mild sigmoid diverticulosis without diverticulitis. Very small fat containing umbilical hernia. Electronically Signed   By: Inge Rise M.D.   On: 04/16/2019 22:32    Pending Labs Unresulted Labs (From admission, onward)    Start  Ordered   04/17/19 0223  Triglycerides  ONCE - STAT,   R     04/17/19 0222   04/17/19 0222  Troponin I - Now Then Q6H  Now then every 6 hours,   R     04/17/19 0222          Vitals/Pain Today's Vitals   04/17/19 0100 04/17/19 0200 04/17/19 0215 04/17/19 0217  BP:    125/87  Pulse:  99 83 91  Resp:  (!) 21 (!) 23 (!) 21  Temp:      TempSrc:      SpO2:  95% 96% 94%   PainSc: 0-No pain       Isolation Precautions No active isolations  Medications Medications  sodium chloride flush (NS) 0.9 % injection 3 mL (3 mLs Intravenous Not Given 04/16/19 1715)  0.9 %  sodium chloride infusion (has no administration in time range)  morphine 2 MG/ML injection 1 mg (has no administration in time range)  aspirin EC tablet 81 mg (has no administration in time range)  pravastatin (PRAVACHOL) tablet 20 mg (has no administration in time range)  metoprolol tartrate (LOPRESSOR) tablet 25 mg (has no administration in time range)  traZODone (DESYREL) tablet 50 mg (has no administration in time range)  Suvorexant TABS 1 tablet (has no administration in time range)  triazolam (HALCION) tablet 0.25 mg (has no administration in time range)  finasteride (PROSCAR) tablet 5 mg (has no administration in time range)  tamsulosin (FLOMAX) capsule 0.4 mg (has no administration in time range)  fish oil-omega-3 fatty acids capsule 1 g (has no administration in time range)  vitamin E capsule 400 Units (has no administration in time range)  ondansetron (ZOFRAN) tablet 4 mg (has no administration in time range)    Or  ondansetron (ZOFRAN) injection 4 mg (has no administration in time range)  hydrALAZINE (APRESOLINE) injection 5 mg (has no administration in time range)  0.9 %  sodium chloride infusion ( Intravenous Stopped 04/16/19 2342)  pantoprazole (PROTONIX) injection 40 mg (40 mg Intravenous Given 04/16/19 2133)  iohexol (OMNIPAQUE) 300 MG/ML solution 100 mL (100 mLs Intravenous Contrast Given 04/16/19 2208)  sodium chloride 0.9 % bolus 250 mL (0 mLs Intravenous Stopped 04/17/19 0000)  morphine 2 MG/ML injection 2 mg (2 mg Intravenous Given 04/16/19 2344)    Mobility walks Low fall risk   Focused Assessments GI   R Recommendations: See Admitting Provider Note  Report given to:   Additional Notes:

## 2019-04-17 NOTE — Progress Notes (Signed)
Pt set up on CPAP [id number].  Nasal mask tolerating well.  Patient wears nasal mask at home.

## 2019-04-17 NOTE — Progress Notes (Signed)
PROGRESS NOTE    Scott [name]  JXB:[id number] DOB: March 01, 1928 DOA: 04/16/2019 PCP: Kirby Funk, MD    Brief Narrative: 85 year old with past medical history significant for hypertension, hyperlipidemia, BPH, CABG, OSA on CPAP, thrombocytopenia who presents complaining of epigastric abdominal pain.  Evaluation in the ED patient was found to have a lipase of 60, negative troponin, white blood cell 9.9, negative COVID test.  CT abdomen and pelvis showed mild stranding about the head and neck of the pancreas consistent with acute pancreatitis.  Patient was admitted with for acute pancreatitis.   Assessment & Plan:   Principal Problem:   Acute pancreatitis Active Problems:   Obstructive sleep apnea   Hyperlipidemia   Thrombocytopenia (HCC)   Hypertension   CAD (coronary artery disease)   BPH (benign prostatic hyperplasia)   Estimated body mass index is 27.79 kg/m as calculated from the following:   Height as of this encounter: 5\' 6"  (1.676 m).   Weight as of this encounter: 78.1 kg.  1-Acute Pancreatitis; unclear etiology.  Patient denies alcohol use, triglyceride levels normal, right upper quadrant ultrasound negative for cholelithiasis. -Continue with IV fluids. -Patient next still experiencing abdominal pain, 7 out of 10.  With n.p.o. status. -IV morphine PRN  2-obstructive sleep apnea: Continue with CPAP  3-Hyperlipidemia: Hold statins while n.p.o.  4-History of thrombocytopenia: Repeat CBC in the morning  5-Hypertension: Hold Maxzide.  Continue with metoprolol.Marland Kitchen  6-BPH; Flomax.   Insomnia; on Belsomra.     DVT prophylaxis: SCD Code Status: Full code Family Communication: care discussed with patient.  Disposition Plan: Patient was admitted with pancreatitis, he is a still experiencing abdominal pain, he is getting IV fluids and he is still n.p.o.  He will remain in the hospital for IV hydration while he is n.p.o.   Consultants:   none   Procedures:   [place]; No cholelithiasis or other evidence acute cholecystitis.      Antimicrobials:   none   Subjective: He is still complaining of abdominal pain, sharp, constant.    Objective: Vitals:   04/17/19 0328 04/17/19 0759 04/17/19 0854 04/17/19 1109  BP: (!) 155/74 (!) 121/59 (!) 131/59 140/65  Pulse: 81 64 62 (!) 59  Resp: 18 15 16 16   Temp: 98.8 F (37.1 C) 98.6 F (37 C)  (!) 97.3 F (36.3 C)  TempSrc: Oral Oral  Oral  SpO2: 97% 97%  98%  Weight: 78.1 kg     Height: 5\' 6"  (1.676 m)       Intake/Output Summary (Last 24 hours) at 04/17/2019 1439 Last data filed at 04/17/2019 1413 Gross per 24 hour  Intake 493.9 ml  Output 600 ml  Net -106.1 ml   Filed Weights   04/17/19 0328  Weight: 78.1 kg    Examination:  General exam: Appears calm and comfortable  Respiratory system: Clear to auscultation. Respiratory effort normal. Cardiovascular system: S1 & S2 heard, RRR. No JVD, murmurs, rubs, gallops or clicks. No pedal edema. Gastrointestinal system: Abdomen is nondistended, soft and nontender. No organomegaly or masses felt. Normal bowel sounds heard. Central nervous system: Alert and oriented. No focal neurological deficits. Extremities: Symmetric 5 x 5 power. Skin: No rashes, lesions or ulcers    Data Reviewed: I have personally reviewed following labs and imaging studies  CBC: Recent Labs  Lab 04/16/19 1724  WBC 9.9  HGB 14.2  HCT 42.4  MCV 96.6  PLT PLATELET CLUMPS NOTED ON SMEAR, COUNT APPEARS DECREASED   Basic Metabolic Panel: Recent Labs  Lab 04/16/19 1724  NA 140  K 3.5  CL 97*  CO2 28  GLUCOSE 106*  BUN 11  CREATININE 1.01  CALCIUM 9.2   GFR: Estimated Creatinine Clearance: 49.7 mL/min (by C-G formula based on SCr of 1.01 mg/dL). Liver Function Tests: Recent Labs  Lab 04/16/19 2124  AST 19  ALT 15  ALKPHOS 70  BILITOT 2.2*  PROT 6.6  ALBUMIN 3.8   Recent Labs  Lab 04/16/19 2124  LIPASE 60*   No results for input(s):  AMMONIA in the last 168 hours. Coagulation Profile: No results for input(s): INR, PROTIME in the last 168 hours. Cardiac Enzymes: Recent Labs  Lab 04/16/19 1724 04/16/19 2124 04/17/19 0455 04/17/19 1123  TROPONINI <0.03 <0.03 <0.03 <0.03   BNP (last 3 results) No results for input(s): PROBNP in the last 8760 hours. HbA1C: No results for input(s): HGBA1C in the last 72 hours. CBG: No results for input(s): GLUCAP in the last 168 hours. Lipid Profile: Recent Labs    04/17/19 0455  TRIG 56   Thyroid Function Tests: No results for input(s): TSH, T4TOTAL, FREET4, T3FREE, THYROIDAB in the last 72 hours. Anemia Panel: No results for input(s): VITAMINB12, FOLATE, FERRITIN, TIBC, IRON, RETICCTPCT in the last 72 hours. Sepsis Labs: No results for input(s): PROCALCITON, LATICACIDVEN in the last 168 hours.  Recent Results (from the past 240 hour(s))  SARS Coronavirus 2 [facility] order, Performed in [facility] hospital lab)     Status: None   Collection Time: 04/16/19 11:16 PM  Result Value Ref Range Status   SARS Coronavirus 2 NEGATIVE NEGATIVE Final    Comment: (NOTE) If result is NEGATIVE SARS-CoV-2 target nucleic acids are NOT DETECTED. The SARS-CoV-2 RNA is generally detectable in upper and lower  respiratory specimens during the acute phase of infection. The lowest  concentration of SARS-CoV-2 viral copies this assay can detect is 250  copies / mL. A negative result does not preclude SARS-CoV-2 infection  and should not be used as the sole basis for treatment or other  patient management decisions.  A negative result may occur with  improper specimen collection / handling, submission of specimen other  than nasopharyngeal swab, presence of viral mutation(s) within the  areas targeted by this assay, and inadequate number of viral copies  (<250 copies / mL). A negative result must be combined with clinical  observations, patient history, and epidemiological information. If  result is POSITIVE SARS-CoV-2 target nucleic acids are DETECTED. The SARS-CoV-2 RNA is generally detectable in upper and lower  respiratory specimens dur ing the acute phase of infection.  Positive  results are indicative of active infection with SARS-CoV-2.  Clinical  correlation with patient history and other diagnostic information is  necessary to determine patient infection status.  Positive results do  not rule out bacterial infection or co-infection with other viruses. If result is PRESUMPTIVE POSTIVE SARS-CoV-2 nucleic acids MAY BE PRESENT.   A presumptive positive result was obtained on the submitted specimen  and confirmed on repeat testing.  While 2019 novel coronavirus  (SARS-CoV-2) nucleic acids may be present in the submitted sample  additional confirmatory testing may be necessary for epidemiological  and / or clinical management purposes  to differentiate between  SARS-CoV-2 and other Sarbecovirus currently known to infect humans.  If clinically indicated additional testing with an alternate test  methodology [phone]) is advised. The SARS-CoV-2 RNA is generally  detectable in upper and lower respiratory sp ecimens during the acute  phase of infection. The  expected result is Negative. Fact Sheet for Patients:  BoilerBrush.com.cy Fact Sheet for Healthcare Providers: https://pope.com/ This test is not yet approved or cleared by the [place] FDA and has been authorized for detection and/or diagnosis of SARS-CoV-2 by FDA under an Emergency Use Authorization (EUA).  This EUA will remain in effect (meaning this test can be used) for the duration of the COVID-19 declaration under Section 564(b)(1) of the Act, 21 U.S.C. section 360bbb-3(b)(1), unless the authorization is terminated or revoked sooner. Performed at [facility], [address] [place], [place] [address]          Radiology Studies: Dg Chest 2  View  Result Date: 04/16/2019 CLINICAL DATA:  Central chest pain for the past 2 days with mild shortness of breath. EXAM: CHEST - 2 VIEW COMPARISON:  Chest x-ray dated April 04, 2015. FINDINGS: The heart size and mediastinal contours are within normal limits. Prior CABG. Normal pulmonary vascularity. No focal consolidation, pleural effusion, or pneumothorax. No acute osseous abnormality. IMPRESSION: No active cardiopulmonary disease. Electronically Signed   By: Obie Dredge M.D.   On: 04/16/2019 17:45   Ct Abdomen Pelvis W Contrast  Result Date: 04/16/2019 CLINICAL DATA:  Chest pain and nausea today. EXAM: CT ABDOMEN AND PELVIS WITH CONTRAST TECHNIQUE: Multidetector CT imaging of the abdomen and pelvis was performed using the standard protocol following bolus administration of intravenous contrast. CONTRAST:  100 mL OMNIPAQUE IOHEXOL 300 MG/ML  SOLN COMPARISON:  CT abdomen and pelvis 05/12/2013. FINDINGS: Lower chest: Calcific coronary atherosclerosis is noted. Lung bases clear. No pleural or pericardial effusion. Heart size is normal. Hepatobiliary: 2-3 punctate calcifications in the liver incidentally noted. The liver is otherwise unremarkable. Gallbladder and biliary tree appear normal. Pancreas: There is mild stranding about the head and neck of the pancreas. The pancreas enhances homogeneously and no focal fluid collection is seen. There is mild pancreatic atrophy. No mass noted. No pancreatic ductal dilatation. Spleen: Normal in size without focal abnormality. Adrenals/Urinary Tract: The adrenal glands appear normal. There is some atrophy of the left kidney. A few small renal cysts are noted. The ureters and urinary bladder appear normal. Stomach/Bowel: The appendix has been removed. A few sigmoid diverticula are identified but there is no diverticulitis. The colon is otherwise unremarkable. Very small hiatal hernia is noted. Small bowel appears normal. Vascular/Lymphatic: Aortic atherosclerosis. No  enlarged abdominal or pelvic lymph nodes. Reproductive: There is prostatomegaly. Other: Small fat containing umbilical hernia is identified. Musculoskeletal: Convex left thoracolumbar scoliosis and severe multilevel degenerative disease are seen. Right hip replacement is in place. IMPRESSION: Mild stranding about the head and neck of the pancreas most consistent with acute pancreatitis. Negative for pancreatic necrosis or pseudocyst. Calcific aortic and coronary atherosclerosis. Prostatomegaly. Mild sigmoid diverticulosis without diverticulitis. Very small fat containing umbilical hernia. Electronically Signed   By: Drusilla Kanner M.D.   On: 04/16/2019 22:32   US Abdomen Limited Ruq  Result Date: 04/17/2019 CLINICAL DATA:  Acute pancreatitis EXAM: ULTRASOUND ABDOMEN LIMITED RIGHT UPPER QUADRANT COMPARISON:  None. FINDINGS: Gallbladder: No gallstones or wall thickening visualized. No sonographic Murphy sign noted by sonographer. Common bile duct: Diameter: 4 mm Liver: No focal lesion identified. Within normal limits in parenchymal echogenicity. Portal vein is patent on color Doppler imaging with normal direction of blood flow towards the liver. IMPRESSION: No cholelithiasis or other evidence acute cholecystitis. Electronically Signed   By: Deatra Robinson M.D.   On: 04/17/2019 03:28        Scheduled Meds: .  aspirin EC  81 mg Oral QPM  . chlorhexidine  15 mL Mouth Rinse BID  . finasteride  5 mg Oral QAC breakfast  . mouth rinse  15 mL Mouth Rinse q12n4p  . metoprolol tartrate  25 mg Oral BID  . pantoprazole (PROTONIX) IV  40 mg Intravenous Q12H  . sodium chloride flush  3 mL Intravenous Once  . tamsulosin  0.4 mg Oral QPC supper  . traZODone  50 mg Oral QHS   Continuous Infusions: . sodium chloride 125 mL/hr at 04/17/19 1438     LOS: 0 days    Time spent: 35 minutes.     Alba Cory, MD [facility] Pager [phone]  If 7PM-7AM, please contact night-coverage  [email or web address] Password [email or web address] 04/17/2019, 2:39 PM

## 2019-04-18 DIAGNOSIS — K8500 Idiopathic acute pancreatitis without necrosis or infection: Secondary | ICD-10-CM | POA: Diagnosis not present

## 2019-04-18 DIAGNOSIS — I10 Essential (primary) hypertension: Secondary | ICD-10-CM

## 2019-04-18 LAB — CBC
HCT: 36.7 % — ABNORMAL LOW (ref 39.0–52.0)
Hemoglobin: 12.1 g/dL — ABNORMAL LOW (ref 13.0–17.0)
MCH: 32.2 pg (ref 26.0–34.0)
MCHC: 33 g/dL (ref 30.0–36.0)
MCV: 97.6 fL (ref 80.0–100.0)
Platelets: 102 10*3/uL — ABNORMAL LOW (ref 150–400)
RBC: 3.76 MIL/uL — ABNORMAL LOW (ref 4.22–5.81)
RDW: 13.2 % (ref 11.5–15.5)
WBC: 10.3 10*3/uL (ref 4.0–10.5)
nRBC: 0 % (ref 0.0–0.2)

## 2019-04-18 LAB — COMPREHENSIVE METABOLIC PANEL
ALT: 12 U/L (ref 0–44)
AST: 15 U/L (ref 15–41)
Albumin: 2.7 g/dL — ABNORMAL LOW (ref 3.5–5.0)
Alkaline Phosphatase: 49 U/L (ref 38–126)
Anion gap: 11 (ref 5–15)
BUN: 9 mg/dL (ref 8–23)
CO2: 28 mmol/L (ref 22–32)
Calcium: 8.2 mg/dL — ABNORMAL LOW (ref 8.9–10.3)
Chloride: 102 mmol/L (ref 98–111)
Creatinine, Ser: 1.13 mg/dL (ref 0.61–1.24)
GFR calc Af Amer: >60 mL/min (ref >60)
GFR calc non Af Amer: 58 mL/min — ABNORMAL LOW (ref >60)
Glucose, Bld: 81 mg/dL (ref 70–99)
Potassium: 3.3 mmol/L — ABNORMAL LOW (ref 3.5–5.1)
Sodium: 141 mmol/L (ref 135–145)
Total Bilirubin: 3 mg/dL — ABNORMAL HIGH (ref 0.3–1.2)
Total Protein: 5.3 g/dL — ABNORMAL LOW (ref 6.5–8.1)

## 2019-04-18 LAB — LIPASE, BLOOD: Lipase: 22 U/L (ref 11–51)

## 2019-04-18 MED ORDER — TRIAMTERENE-HCTZ 37.5-25 MG PO TABS: 1.0000 | ORAL_TABLET | Freq: Every day | ORAL | 0 refills | Status: DC

## 2019-04-18 MED ORDER — PANTOPRAZOLE SODIUM 40 MG PO TBEC: 40.0000 mg | DELAYED_RELEASE_TABLET | Freq: Two times a day (BID) | ORAL | Status: DC

## 2019-04-18 MED ORDER — POTASSIUM CHLORIDE CRYS ER 20 MEQ PO TBCR
40.0000 meq | EXTENDED_RELEASE_TABLET | Freq: Once | ORAL | Status: AC
  Administered 2019-04-18: 40 meq via ORAL
  Filled 2019-04-18: qty 2

## 2019-04-18 MED FILL — TRIAMTERENE-HCTZ 37.5-25 MG: 37.5-25 | 30 days supply | Qty: 30 | Fill #0

## 2019-04-18 NOTE — Discharge Summary (Signed)
Physician Discharge Summary  Scott [name] CZY:[id number] DOB: 21-Aug-1928 DOA: 04/16/2019  PCP: Kirby Funk, MD  Admit date: 04/16/2019 Discharge date: 04/18/2019  Admitted From: Home Disposition: Home  Recommendations for Outpatient Follow-up:  1. Follow up with PCP in 1-2 weeks 2. Please obtain CBC/BMP/Mag at follow up 3. Please follow up on the following pending results: None  Home Health: None Equipment/Devices: None  Discharge Condition: Stable CODE STATUS: Full code  Hospital Course: 85 y.o. male with medical history significant of hypertension, hyperlipidemia, GERD, BPH, LAD, CABG, OSA on CPAP, thrombocytopenia, who presents with epigastric abdominal pain and found to have mild uncomplicated pancreatitis.  In ED, hemodynamically stable.  Lipase elevated to 60.  CBC, CMP, CXR, EKG and troponin not impressive.  COVID-19 negative. CT abdomen and pelvis revealed mild stranding about the head and neck of the pancreas most consistent with acute pancreatitis without pseudocyst or necrosis.  Started on bowel rest, IV fluid and pain meds with improvement in his symptoms the next day.  Pain resolved.  Lipase normalized.  Diet advanced from clear to soft without complication of significant pain.  See individual problem list below for more.  Discharge Diagnoses:  Mild uncomplicated acute pancreatitis of unclear etiology: Resolved.  Lipase normalized.  Patient tolerated soft diet without issue. Unclear etiology.  Right upper quadrant ultrasound negative.  Not a drinker.  Triglyceride 56.  Serial troponin and EKG negative.  OSA -Nightly CPAP  Thrombocytopenia: Stable  History of CAD status post CABG: Serial troponin EKG reassuring -Discharged on home medications  BPH: Stable -Restarted on home medications  Hypertension: Normotensive -Change Dyazide to Maxzide given borderline hyponatremia and hypokalemia  Discharge Instructions  Discharge Instructions    Call MD for:   difficulty breathing, headache or visual disturbances   Complete by:  As directed    Call MD for:  extreme fatigue   Complete by:  As directed    Call MD for:  persistant dizziness or light-headedness   Complete by:  As directed    Call MD for:  persistant nausea and vomiting   Complete by:  As directed    Call MD for:  severe uncontrolled pain   Complete by:  As directed    Call MD for:  temperature >100.4   Complete by:  As directed    Diet - low sodium heart healthy   Complete by:  As directed    Discharge instructions   Complete by:  As directed    It has been a pleasure taking care of you! You were admitted with acute pancreatitis (inflammation of the pancreas).  We treated you with IV fluid and pain medications and your symptoms improved.  You may continue adjusting your diet between soft and full liquid diet with pain as your guide. Recommend follow-up with your primary care doctor in 1 to 2 weeks.  There could be some changes to your medication during this hospitalization.  We recommend you read your medication list and the directions carefully before you take your medications.  Once you are discharged, your primary care physician will handle any further medical issues. Please note that NO REFILLS for any discharge medications will be authorized once you are discharged, as it is imperative that you return to your primary care physician (or establish a relationship with a primary care physician if you do not have one) for your aftercare needs so that they can reassess your need for medications and monitor your lab values. Take care,   Increase activity  slowly   Complete by:  As directed      Allergies as of 04/18/2019      Reactions   Acetaminophen    Other reaction(s): mouth sores   Amlodipine Swelling   Swelling of feet and legs   Bactrim [sulfamethoxazole-trimethoprim] Other (See Comments)   Constipation    Codeine Nausea Only   Fexofenadine    Other reaction(s): mouth  sores   Ibuprofen    Other reaction(s): nervousness   Lovenox [enoxaparin Sodium] Other (See Comments)   MAY HAVE CAUSED THROMBOCYTOPENIA   Naproxen    Other reaction(s): mouth sores   Prednisone Swelling   Patient has tolerated doses for colds. Thinks it caused swelling in the past   Shellfish Allergy Nausea And Vomiting, Other (See Comments)   N&V, shaking   Sonata [zaleplon] Other (See Comments)   Hyper feelings      Medication List    STOP taking these medications   triamterene-hydrochlorothiazide 75-50 MG tablet Commonly known as:  MAXZIDE Replaced by:  triamterene-hydrochlorothiazide 37.5-25 MG tablet     TAKE these medications   aspirin 81 MG tablet Take 81 mg by mouth every evening.   finasteride 5 MG tablet Commonly known as:  PROSCAR Take 5 mg by mouth daily before breakfast.   fish oil-omega-3 fatty acids 1000 MG capsule Take 1 g by mouth every other day.   lovastatin 20 MG tablet Commonly known as:  MEVACOR Take 20 mg by mouth at bedtime.   metoprolol tartrate 50 MG tablet Commonly known as:  LOPRESSOR Take 25 mg by mouth 2 (two) times daily.   potassium chloride 10 MEQ tablet Commonly known as:  K-DUR Take 10 mEq by mouth every evening.   Suvorexant 15 MG Tabs Commonly known as:  Belsomra Take 1 tablet by mouth at bedtime as needed. What changed:  reasons to take this   tamsulosin 0.4 MG Caps capsule Commonly known as:  FLOMAX Take 0.4 mg by mouth daily after supper.   traZODone 50 MG tablet Commonly known as:  DESYREL Take 1 tablet (50 mg total) by mouth at bedtime.   triamterene-hydrochlorothiazide 37.5-25 MG tablet Commonly known as:  MAXZIDE-25 Take 1 tablet by mouth daily for 30 days. Replaces:  triamterene-hydrochlorothiazide 75-50 MG tablet   triazolam 0.25 MG tablet Commonly known as:  HALCION Take 1 tablet (0.25 mg total) by mouth at bedtime as needed. What changed:  reasons to take this   vitamin E 400 UNIT capsule Take 400  Units by mouth daily.      Follow-up Information    Kirby Funk, MD. Schedule an appointment as soon as possible for a visit in 1 week(s).   Specialty:  Internal Medicine Contact information: [address] [place] [address] [phone]        [place], Peter M, MD .   Specialty:  Cardiology Contact information: [address] [place] [address] [phone]           Consultations:  None  Procedures/Studies:  2D Echo: None obtained this admission.  Dg Chest 2 View  Result Date: 04/16/2019 CLINICAL DATA:  Central chest pain for the past 2 days with mild shortness of breath. EXAM: CHEST - 2 VIEW COMPARISON:  Chest x-ray dated April 04, 2015. FINDINGS: The heart size and mediastinal contours are within normal limits. Prior CABG. Normal pulmonary vascularity. No focal consolidation, pleural effusion, or pneumothorax. No acute osseous abnormality. IMPRESSION: No active cardiopulmonary disease. Electronically Signed  By: Obie Dredge M.D.   On: 04/16/2019 17:45   Ct Abdomen Pelvis W Contrast  Result Date: 04/16/2019 CLINICAL DATA:  Chest pain and nausea today. EXAM: CT ABDOMEN AND PELVIS WITH CONTRAST TECHNIQUE: Multidetector CT imaging of the abdomen and pelvis was performed using the standard protocol following bolus administration of intravenous contrast. CONTRAST:  100 mL OMNIPAQUE IOHEXOL 300 MG/ML  SOLN COMPARISON:  CT abdomen and pelvis 05/12/2013. FINDINGS: Lower chest: Calcific coronary atherosclerosis is noted. Lung bases clear. No pleural or pericardial effusion. Heart size is normal. Hepatobiliary: 2-3 punctate calcifications in the liver incidentally noted. The liver is otherwise unremarkable. Gallbladder and biliary tree appear normal. Pancreas: There is mild stranding about the head and neck of the pancreas. The pancreas enhances homogeneously and no focal fluid collection is seen. There is mild pancreatic atrophy. No mass  noted. No pancreatic ductal dilatation. Spleen: Normal in size without focal abnormality. Adrenals/Urinary Tract: The adrenal glands appear normal. There is some atrophy of the left kidney. A few small renal cysts are noted. The ureters and urinary bladder appear normal. Stomach/Bowel: The appendix has been removed. A few sigmoid diverticula are identified but there is no diverticulitis. The colon is otherwise unremarkable. Very small hiatal hernia is noted. Small bowel appears normal. Vascular/Lymphatic: Aortic atherosclerosis. No enlarged abdominal or pelvic lymph nodes. Reproductive: There is prostatomegaly. Other: Small fat containing umbilical hernia is identified. Musculoskeletal: Convex left thoracolumbar scoliosis and severe multilevel degenerative disease are seen. Right hip replacement is in place. IMPRESSION: Mild stranding about the head and neck of the pancreas most consistent with acute pancreatitis. Negative for pancreatic necrosis or pseudocyst. Calcific aortic and coronary atherosclerosis. Prostatomegaly. Mild sigmoid diverticulosis without diverticulitis. Very small fat containing umbilical hernia. Electronically Signed   By: Drusilla Kanner M.D.   On: 04/16/2019 22:32   US Abdomen Limited Ruq  Result Date: 04/17/2019 CLINICAL DATA:  Acute pancreatitis EXAM: ULTRASOUND ABDOMEN LIMITED RIGHT UPPER QUADRANT COMPARISON:  None. FINDINGS: Gallbladder: No gallstones or wall thickening visualized. No sonographic Murphy sign noted by sonographer. Common bile duct: Diameter: 4 mm Liver: No focal lesion identified. Within normal limits in parenchymal echogenicity. Portal vein is patent on color Doppler imaging with normal direction of blood flow towards the liver. IMPRESSION: No cholelithiasis or other evidence acute cholecystitis. Electronically Signed   By: Deatra Robinson M.D.   On: 04/17/2019 03:28      Subjective: No major events overnight of this morning.  No nausea, vomiting or abdominal  pain.  No chest pain or dyspnea.  Tolerated soft diet prior to discharge.   Discharge Exam: Vitals:   04/18/19 0549 04/18/19 0817  BP: (!) 109/39 (!) 116/51  Pulse: 63 (!) 56  Resp: 18   Temp: 99.3 F (37.4 C)   SpO2: 95%     GENERAL: No acute distress.  Appears well.  HEENT: MMM.  Vision and hearing grossly intact.  NECK: Supple.  No JVD.  LUNGS:  No IWOB. Good air movement bilaterally. HEART:  RRR. Heart sounds normal.  ABD: Bowel sounds present. Soft. Non tender.  MSK/EXT:  Moves all extremities. No apparent deformity. No edema bilaterally. SKIN: no apparent skin lesion or wound NEURO: Awake, alert and oriented appropriately.  No gross deficit.  PSYCH: Calm. Normal affect.     The results of significant diagnostics from this hospitalization (including imaging, microbiology, ancillary and laboratory) are listed below for reference.     Microbiology: Recent Results (from the past 240 hour(s))  SARS Coronavirus 2 (  Hospital order, Performed in [facility] hospital lab)     Status: None   Collection Time: 04/16/19 11:16 PM  Result Value Ref Range Status   SARS Coronavirus 2 NEGATIVE NEGATIVE Final    Comment: (NOTE) If result is NEGATIVE SARS-CoV-2 target nucleic acids are NOT DETECTED. The SARS-CoV-2 RNA is generally detectable in upper and lower  respiratory specimens during the acute phase of infection. The lowest  concentration of SARS-CoV-2 viral copies this assay can detect is 250  copies / mL. A negative result does not preclude SARS-CoV-2 infection  and should not be used as the sole basis for treatment or other  patient management decisions.  A negative result may occur with  improper specimen collection / handling, submission of specimen other  than nasopharyngeal swab, presence of viral mutation(s) within the  areas targeted by this assay, and inadequate number of viral copies  (<250 copies / mL). A negative result must be combined with clinical    observations, patient history, and epidemiological information. If result is POSITIVE SARS-CoV-2 target nucleic acids are DETECTED. The SARS-CoV-2 RNA is generally detectable in upper and lower  respiratory specimens dur ing the acute phase of infection.  Positive  results are indicative of active infection with SARS-CoV-2.  Clinical  correlation with patient history and other diagnostic information is  necessary to determine patient infection status.  Positive results do  not rule out bacterial infection or co-infection with other viruses. If result is PRESUMPTIVE POSTIVE SARS-CoV-2 nucleic acids MAY BE PRESENT.   A presumptive positive result was obtained on the submitted specimen  and confirmed on repeat testing.  While 2019 novel coronavirus  (SARS-CoV-2) nucleic acids may be present in the submitted sample  additional confirmatory testing may be necessary for epidemiological  and / or clinical management purposes  to differentiate between  SARS-CoV-2 and other Sarbecovirus currently known to infect humans.  If clinically indicated additional testing with an alternate test  methodology [phone]) is advised. The SARS-CoV-2 RNA is generally  detectable in upper and lower respiratory sp ecimens during the acute  phase of infection. The expected result is Negative. Fact Sheet for Patients:  BoilerBrush.com.cy Fact Sheet for Healthcare Providers: https://pope.com/ This test is not yet approved or cleared by the [place] FDA and has been authorized for detection and/or diagnosis of SARS-CoV-2 by FDA under an Emergency Use Authorization (EUA).  This EUA will remain in effect (meaning this test can be used) for the duration of the COVID-19 declaration under Section 564(b)(1) of the Act, 21 U.S.C. section 360bbb-3(b)(1), unless the authorization is terminated or revoked sooner. Performed at [facility], [address]  [place], [place] [address]      Labs: BNP (last 3 results) No results for input(s): BNP in the last 8760 hours. Basic Metabolic Panel: Recent Labs  Lab 04/16/19 1724 04/18/19 0436  NA 140 141  K 3.5 3.3*  CL 97* 102  CO2 28 28  GLUCOSE 106* 81  BUN 11 9  CREATININE 1.01 1.13  CALCIUM 9.2 8.2*   Liver Function Tests: Recent Labs  Lab 04/16/19 2124 04/18/19 0436  AST 19 15  ALT 15 12  ALKPHOS 70 49  BILITOT 2.2* 3.0*  PROT 6.6 5.3*  ALBUMIN 3.8 2.7*   Recent Labs  Lab 04/16/19 2124 04/18/19 0436  LIPASE 60* 22   No results for input(s): AMMONIA in the last 168 hours. CBC: Recent Labs  Lab 04/16/19 1724 04/18/19 0436  WBC 9.9 10.3  HGB 14.2 12.1*  HCT 42.4 36.7*  MCV 96.6 97.6  PLT PLATELET CLUMPS NOTED ON SMEAR, COUNT APPEARS DECREASED 102*   Cardiac Enzymes: Recent Labs  Lab 04/16/19 1724 04/16/19 2124 04/17/19 0455 04/17/19 1123 04/17/19 1640  TROPONINI <0.03 <0.03 <0.03 <0.03 <0.03   BNP: Invalid input(s): POCBNP CBG: No results for input(s): GLUCAP in the last 168 hours. D-Dimer No results for input(s): DDIMER in the last 72 hours. Hgb A1c No results for input(s): HGBA1C in the last 72 hours. Lipid Profile Recent Labs    04/17/19 0455  TRIG 56   Thyroid function studies No results for input(s): TSH, T4TOTAL, T3FREE, THYROIDAB in the last 72 hours.  Invalid input(s): FREET3 Anemia work up No results for input(s): VITAMINB12, FOLATE, FERRITIN, TIBC, IRON, RETICCTPCT in the last 72 hours. Urinalysis No results found for: COLORURINE, APPEARANCEUR, LABSPEC, PHURINE, GLUCOSEU, HGBUR, BILIRUBINUR, KETONESUR, PROTEINUR, UROBILINOGEN, NITRITE, LEUKOCYTESUR Sepsis Labs Invalid input(s): PROCALCITONIN,  WBC,  LACTICIDVEN   Time coordinating discharge: 25 minutes  SIGNED:  Almon Hercules, MD  [facility] 04/18/2019, 11:40 AM Pager [phone]  If 7PM-7AM, please contact night-coverage [email or web address] Password [email or web address]

## 2019-04-18 NOTE — Plan of Care (Signed)
  Problem: Pain Managment: Goal: General experience of comfort will improve Outcome: Progressing   Problem: Safety: Goal: Ability to remain free from injury will improve 04/18/2019 0326 by Emelia Salisbury, RN Outcome: Progressing 04/18/2019 0325 by Emelia Salisbury, RN Outcome: Progressing   Problem: Skin Integrity: Goal: Risk for impaired skin integrity will decrease Outcome: Progressing

## 2019-04-25 DIAGNOSIS — R69 Illness, unspecified: Secondary | ICD-10-CM | POA: Diagnosis not present

## 2019-04-25 DIAGNOSIS — I10 Essential (primary) hypertension: Secondary | ICD-10-CM | POA: Diagnosis not present

## 2019-04-27 DIAGNOSIS — R69 Illness, unspecified: Secondary | ICD-10-CM | POA: Diagnosis not present

## 2019-05-03 ENCOUNTER — Encounter: Payer: Self-pay | Admitting: Physician Assistant

## 2019-05-03 ENCOUNTER — Telehealth (INDEPENDENT_AMBULATORY_CARE_PROVIDER_SITE_OTHER): Payer: Medicare HMO | Admitting: Physician Assistant

## 2019-05-03 VITALS — BP 124/76 | HR 70 | Temp 98.0°F | Ht 66.0 in | Wt 170.0 lb

## 2019-05-03 DIAGNOSIS — I25810 Atherosclerosis of coronary artery bypass graft(s) without angina pectoris: Secondary | ICD-10-CM | POA: Diagnosis not present

## 2019-05-03 DIAGNOSIS — I10 Essential (primary) hypertension: Secondary | ICD-10-CM

## 2019-05-03 DIAGNOSIS — E785 Hyperlipidemia, unspecified: Secondary | ICD-10-CM

## 2019-05-03 DIAGNOSIS — K859 Acute pancreatitis without necrosis or infection, unspecified: Secondary | ICD-10-CM

## 2019-05-03 NOTE — Patient Instructions (Signed)
Medication Instructions:   Your physician recommends that you continue on your current medications as directed. Please refer to the Current Medication list given to you today.  If you need a refill on your cardiac medications before your next appointment, please call your pharmacy.   Lab work:  NONE ordered at this time of appointment   If you have labs (blood work) drawn today and your tests are completely normal, you will receive your results only by: Marland Kitchen MyChart Message (if you have MyChart) OR . A paper copy in the mail If you have any lab test that is abnormal or we need to change your treatment, we will call you to review the results.  Testing/Procedures:  NONE ordered at this time of appointment   Follow-Up: At [facility], you and your health needs are our priority.  As part of our continuing mission to provide you with exceptional heart care, we have created designated Provider Care Teams.  These Care Teams include your primary Cardiologist (physician) and Advanced Practice Providers (APPs -  Physician Assistants and Nurse Practitioners) who all work together to provide you with the care you need, when you need it. You will need a follow up appointment in 4-5 months.  Please call our office 2 months in advance to schedule this appointment.  You may see Peter Martinique, MD or one of the following Advanced Practice Providers on your designated Care Team: [place], [place] . Fabian Sharp, PA-C  Any Other Special Instructions Will Be Listed Below (If Applicable).

## 2019-05-03 NOTE — Progress Notes (Signed)
Virtual Visit via Telephone Note   This visit type was conducted due to national recommendations for restrictions regarding the COVID-19 Pandemic (e.g. social distancing) in an effort to limit this patient's exposure and mitigate transmission in our community.  Due to his co-morbid illnesses, this patient is at least at moderate risk for complications without adequate follow up.  This format is felt to be most appropriate for this patient at this time.  The patient did not have access to video technology/had technical difficulties with video requiring transitioning to audio format only (telephone).  All issues noted in this document were discussed and addressed.  No physical exam could be performed with this format.  Please refer to the patient's chart for his  consent to telehealth for [facility].   Date:  05/05/2019   ID:  Scott [name], DOB 15-Jul-1928, MRN [id number]  Patient Location: Home Provider Location: Home  PCP:  Kirby Funk, MD  Cardiologist:  Peter Swaziland, MD  Electrophysiologist:  None   Evaluation Performed:  Follow-Up Visit  Chief Complaint:  followup  History of Present Illness:    Scott [name] is a 85 y.o. male with PMH of CAD s/p CABG 2007, HTN, HLD, and OSA.  He had a negative Myoview in 2010.  Previous echocardiogram obtained in June 2013 showed normal EF, moderate MR, otherwise essentially normal echo.  Patient was most recently seen by Dr. Swaziland in January 2010, at which time he was doing well.  More recently, patient presented to the hospital with epigastric abdominal pain and found to have mild pancreatitis.  Lipase was elevated at 60.  COVID-19 test was negative.  CT of abdomen and pelvis revealed mild stranding about the head and neck of the pancreas most consistent was acute pancreatitis without pseudocyst or necrosis.  He was treated with bowel rest, IV fluid and pain med with improvement of the symptom.  EKG did not show acute changes.  Serial troponin  during this admission was negative. His maxzide was reduced in half.   Patient was contacted today via telephone visit.  He is doing well on the current medication and he denies any chest pain or shortness of breath.  He denies any lower extremity edema, orthopnea or PND.  After his Maxide was reduced in dosage, he seems to be tolerating the new dose without any issue.  The patient does not have symptoms concerning for COVID-19 infection (fever, chills, cough, or new shortness of breath).    Past Medical History:  Diagnosis Date   Anemia    Acute blood loss anemia secondary to surgery, followed fr/ Hip replacement    Anxiety    pt. reports that he has occas. nervous episodes    BPH (benign prostatic hyperplasia)    CAD (coronary artery disease)    s/p CABG in 2007  CARDILOLOGIST IS DR. P. Swaziland   Cancer (HCC)    melanoma - removed L ear - 2010   Cranial neuropathy    resolved    Generalized OA    GERD (gastroesophageal reflux disease)    H/O echocardiogram    last echo, stress test 05/2012- pt. followed by [place]    Hypercholesterolemia    Hypertension    Obesity    Osteoarthritis    End-stage osteoarthritis, right hip   Sleep apnea    with use of CPAP   SOB (shortness of breath)    WITH EXERTION   Thrombocytopenia (HCC)    possibly due to Lovenox   Past  Surgical History:  Procedure Laterality Date   ACHILLES TENDON SURGERY  10/06/2012   Procedure: ACHILLES TENDON REPAIR;  Surgeon: Thera Flake., MD;  Location: [facility] OR;  Service: Orthopedics;  Laterality: Right;  REPAIR RIGHT RUPTURE ACHILLES TENDON PRIMARY OPEN/PERCUTANEOUS, EXCISION PARTIAL BONE TALUS/CANCANEUS, REPAIR PARTIAL EXCISION CALCANEUS   APPENDECTOMY     ARTERY BIOPSY     temporal - L side- wnl, double vision treated /w prednisone - 1991   CALCANEAL OSTEOTOMY  10/06/2012   Procedure: CALCANEAL OSTEOTOMY;  Surgeon: Thera Flake., MD;  Location: [facility] OR;  Service: Orthopedics;  Laterality:  Right;   CARDIAC CATHETERIZATION  12/15/2005   NORMAL. EF 60%, angioplasty 1995   carpel tunnel surgery-bilateral     both hands    CORONARY ARTERY BYPASS GRAFT  2007   LIMA GRAFT TO THE LAD, SAPHENOUS VEIN GRAFT TO THE FIRST OBTUSE MARIGNAL, SAPHENOUS VEIN GRAFT TO THE SECOND OBTUSE MARGINAL AND A SAPHENOUS VEIN GRAFT TO THE ACUTE MARGINAL AND POSTERIOR LATERAL BRANCH   HERNIA REPAIR     I&D EXTREMITY  12/11/2012   Procedure: IRRIGATION AND DEBRIDEMENT EXTREMITY;  Surgeon: Nadara Mustard, MD;  Location: [facility] OR;  Service: Orthopedics;  Laterality: Right;  Irrigation and Debridement achilles, wound closure, apply wound vac, place antibiotic beads   INGUINAL HERNIA REPAIR Left 05/10/2013   Procedure: HERNIA REPAIR INGUINAL ADULT;  Surgeon: Velora Heckler, MD;  Location: [facility] ORS;  Service: General;  Laterality: Left;   INSERTION OF MESH Left 05/10/2013   Procedure: INSERTION OF MESH;  Surgeon: Velora Heckler, MD;  Location: [facility]S;  Service: General;  Laterality: Left;   JOINT REPLACEMENT     knee  11/12   left arthroscopic   TOTAL HIP ARTHROPLASTY     right     Current Meds  Medication Sig   aspirin 81 MG tablet Take 81 mg by mouth every evening.    finasteride (PROSCAR) 5 MG tablet Take 5 mg by mouth daily before breakfast.    fish oil-omega-3 fatty acids 1000 MG capsule Take 1 g by mouth every other day.    lovastatin (MEVACOR) 20 MG tablet Take 20 mg by mouth at bedtime.    metoprolol (LOPRESSOR) 50 MG tablet Take 25 mg by mouth 2 (two) times daily.   potassium chloride (K-DUR,KLOR-CON) 10 MEQ tablet Take 10 mEq by mouth every evening.    Suvorexant (BELSOMRA) 15 MG TABS Take 1 tablet by mouth at bedtime as needed. (Patient taking differently: Take 1 tablet by mouth at bedtime as needed (insomnia). )   Tamsulosin HCl (FLOMAX) 0.4 MG CAPS Take 0.4 mg by mouth daily after supper.   traZODone (DESYREL) 50 MG tablet Take 1 tablet (50 mg total) by mouth at bedtime.    triamterene-hydrochlorothiazide (MAXZIDE-25) 37.5-25 MG tablet Take 1 tablet by mouth daily for 30 days.   triazolam (HALCION) 0.25 MG tablet Take 1 tablet (0.25 mg total) by mouth at bedtime as needed. (Patient taking differently: Take 0.25 mg by mouth at bedtime as needed for sleep. )   vitamin E 400 UNIT capsule Take 400 Units by mouth daily.      Allergies:   Acetaminophen; Amlodipine; Bactrim [sulfamethoxazole-trimethoprim]; Codeine; Fexofenadine; Ibuprofen; Lovenox [enoxaparin sodium]; Naproxen; Prednisone; Shellfish allergy; and Sonata [zaleplon]   Social History   Tobacco Use   Smoking status: Never Smoker   Smokeless tobacco: Never Used  Substance Use Topics   Alcohol use: No    Alcohol/week: 0.0 standard drinks  Drug use: No     Family Hx: The patient's family history includes Heart disease in his brother and brother; Prostate cancer in his brother.  ROS:   Please see the history of present illness.     All other systems reviewed and are negative.   Prior CV studies:   The following studies were reviewed today:  CT of abdomen 04/16/2019 IMPRESSION: Mild stranding about the head and neck of the pancreas most consistent with acute pancreatitis. Negative for pancreatic necrosis or pseudocyst.  Calcific aortic and coronary atherosclerosis.  Prostatomegaly.  Mild sigmoid diverticulosis without diverticulitis.  Very small fat containing umbilical hernia.  Labs/Other Tests and Data Reviewed:    EKG:  An ECG dated 04/16/2019 was personally reviewed today and demonstrated:  Normal sinus rhythm without significant ST-T wave changes  Recent Labs: 04/18/2019: ALT 12; BUN 9; Creatinine, Ser 1.13; Hemoglobin 12.1; Platelets 102; Potassium 3.3; Sodium 141   Recent Lipid Panel Lab Results  Component Value Date/Time   CHOL 152 05/17/2011 08:26 AM   TRIG 56 04/17/2019 04:55 AM   HDL 40.30 05/17/2011 08:26 AM   CHOLHDL 4 05/17/2011 08:26 AM   LDLCALC 82  05/17/2011 08:26 AM    Wt Readings from Last 3 Encounters:  05/03/19 170 lb (77.1 kg)  04/18/19 171 lb 3.2 oz (77.7 kg)  01/09/19 173 lb (78.5 kg)     Objective:    Vital Signs:  BP 124/76    Pulse 70    Temp 98 F (36.7 C)    Ht 5\' 6"  (1.676 m)    Wt 170 lb (77.1 kg)    BMI 27.44 kg/m    VITAL SIGNS:  reviewed  ASSESSMENT & PLAN:    1. CAD: Denies any significant chest pain.  Continue aspirin, metoprolol and lovastatin  2. Hypertension: Blood pressure stable on current medication.  His Maxide was recently reduced in dosage.  He is tolerating the new dose with normal blood pressure.  3. Hyperlipidemia: Continue lovastatin  4. Acute pancreatitis: Resolved with bowel rest and IV fluids.  COVID-19 Education: The signs and symptoms of COVID-19 were discussed with the patient and how to seek care for testing (follow up with PCP or arrange E-visit).  The importance of social distancing was discussed today.  Time:   Today, I have spent 13 minutes with the patient with telehealth technology discussing the above problems.     Medication Adjustments/Labs and Tests Ordered: Current medicines are reviewed at length with the patient today.  Concerns regarding medicines are outlined above.   Tests Ordered: No orders of the defined types were placed in this encounter.   Medication Changes: No orders of the defined types were placed in this encounter.   Disposition:  Follow up in 4 month(s)  Signed, Azalee Course, [place]  05/05/2019 11:41 PM    [facility]

## 2019-05-06 DIAGNOSIS — M179 Osteoarthritis of knee, unspecified: Secondary | ICD-10-CM | POA: Diagnosis not present

## 2019-05-06 DIAGNOSIS — N400 Enlarged prostate without lower urinary tract symptoms: Secondary | ICD-10-CM | POA: Diagnosis not present

## 2019-05-06 DIAGNOSIS — M169 Osteoarthritis of hip, unspecified: Secondary | ICD-10-CM | POA: Diagnosis not present

## 2019-05-06 DIAGNOSIS — I2510 Atherosclerotic heart disease of native coronary artery without angina pectoris: Secondary | ICD-10-CM | POA: Diagnosis not present

## 2019-05-06 DIAGNOSIS — I10 Essential (primary) hypertension: Secondary | ICD-10-CM | POA: Diagnosis not present

## 2019-05-11 DIAGNOSIS — X32XXXD Exposure to sunlight, subsequent encounter: Secondary | ICD-10-CM | POA: Diagnosis not present

## 2019-05-11 DIAGNOSIS — L570 Actinic keratosis: Secondary | ICD-10-CM | POA: Diagnosis not present

## 2019-05-11 DIAGNOSIS — Z08 Encounter for follow-up examination after completed treatment for malignant neoplasm: Secondary | ICD-10-CM | POA: Diagnosis not present

## 2019-05-11 DIAGNOSIS — Z1283 Encounter for screening for malignant neoplasm of skin: Secondary | ICD-10-CM | POA: Diagnosis not present

## 2019-05-11 DIAGNOSIS — L820 Inflamed seborrheic keratosis: Secondary | ICD-10-CM | POA: Diagnosis not present

## 2019-05-11 DIAGNOSIS — S20462A Insect bite (nonvenomous) of left back wall of thorax, initial encounter: Secondary | ICD-10-CM | POA: Diagnosis not present

## 2019-05-11 DIAGNOSIS — D225 Melanocytic nevi of trunk: Secondary | ICD-10-CM | POA: Diagnosis not present

## 2019-05-11 DIAGNOSIS — Z85820 Personal history of malignant melanoma of skin: Secondary | ICD-10-CM | POA: Diagnosis not present

## 2019-05-31 DIAGNOSIS — M169 Osteoarthritis of hip, unspecified: Secondary | ICD-10-CM | POA: Diagnosis not present

## 2019-05-31 DIAGNOSIS — M179 Osteoarthritis of knee, unspecified: Secondary | ICD-10-CM | POA: Diagnosis not present

## 2019-05-31 DIAGNOSIS — I2510 Atherosclerotic heart disease of native coronary artery without angina pectoris: Secondary | ICD-10-CM | POA: Diagnosis not present

## 2019-05-31 DIAGNOSIS — I10 Essential (primary) hypertension: Secondary | ICD-10-CM | POA: Diagnosis not present

## 2019-05-31 DIAGNOSIS — N400 Enlarged prostate without lower urinary tract symptoms: Secondary | ICD-10-CM | POA: Diagnosis not present

## 2019-06-05 DIAGNOSIS — I2510 Atherosclerotic heart disease of native coronary artery without angina pectoris: Secondary | ICD-10-CM | POA: Diagnosis not present

## 2019-06-05 DIAGNOSIS — I10 Essential (primary) hypertension: Secondary | ICD-10-CM | POA: Diagnosis not present

## 2019-06-05 DIAGNOSIS — M179 Osteoarthritis of knee, unspecified: Secondary | ICD-10-CM | POA: Diagnosis not present

## 2019-06-05 DIAGNOSIS — N400 Enlarged prostate without lower urinary tract symptoms: Secondary | ICD-10-CM | POA: Diagnosis not present

## 2019-06-05 DIAGNOSIS — M169 Osteoarthritis of hip, unspecified: Secondary | ICD-10-CM | POA: Diagnosis not present

## 2019-06-07 DIAGNOSIS — I10 Essential (primary) hypertension: Secondary | ICD-10-CM | POA: Diagnosis not present

## 2019-06-14 DIAGNOSIS — G4733 Obstructive sleep apnea (adult) (pediatric): Secondary | ICD-10-CM | POA: Diagnosis not present

## 2019-06-14 DIAGNOSIS — G4730 Sleep apnea, unspecified: Secondary | ICD-10-CM | POA: Diagnosis not present

## 2019-06-28 DIAGNOSIS — I10 Essential (primary) hypertension: Secondary | ICD-10-CM | POA: Diagnosis not present

## 2019-06-28 DIAGNOSIS — I2510 Atherosclerotic heart disease of native coronary artery without angina pectoris: Secondary | ICD-10-CM | POA: Diagnosis not present

## 2019-06-28 DIAGNOSIS — N400 Enlarged prostate without lower urinary tract symptoms: Secondary | ICD-10-CM | POA: Diagnosis not present

## 2019-06-28 DIAGNOSIS — M169 Osteoarthritis of hip, unspecified: Secondary | ICD-10-CM | POA: Diagnosis not present

## 2019-06-28 DIAGNOSIS — M179 Osteoarthritis of knee, unspecified: Secondary | ICD-10-CM | POA: Diagnosis not present

## 2019-07-04 DIAGNOSIS — D3131 Benign neoplasm of right choroid: Secondary | ICD-10-CM | POA: Diagnosis not present

## 2019-07-04 DIAGNOSIS — D3132 Benign neoplasm of left choroid: Secondary | ICD-10-CM | POA: Diagnosis not present

## 2019-07-04 DIAGNOSIS — H5703 Miosis: Secondary | ICD-10-CM | POA: Diagnosis not present

## 2019-07-04 DIAGNOSIS — H04123 Dry eye syndrome of bilateral lacrimal glands: Secondary | ICD-10-CM | POA: Diagnosis not present

## 2019-07-04 DIAGNOSIS — H25811 Combined forms of age-related cataract, right eye: Secondary | ICD-10-CM | POA: Diagnosis not present

## 2019-07-04 DIAGNOSIS — H2512 Age-related nuclear cataract, left eye: Secondary | ICD-10-CM | POA: Diagnosis not present

## 2019-07-04 DIAGNOSIS — H2181 Floppy iris syndrome: Secondary | ICD-10-CM | POA: Diagnosis not present

## 2019-07-10 DIAGNOSIS — R609 Edema, unspecified: Secondary | ICD-10-CM | POA: Diagnosis not present

## 2019-07-10 DIAGNOSIS — I10 Essential (primary) hypertension: Secondary | ICD-10-CM | POA: Diagnosis not present

## 2019-08-30 DIAGNOSIS — R69 Illness, unspecified: Secondary | ICD-10-CM | POA: Diagnosis not present

## 2019-09-04 ENCOUNTER — Telehealth: Payer: Self-pay | Admitting: Cardiology

## 2019-09-04 NOTE — Telephone Encounter (Signed)
LVM for patient to call and schedule 4 month followup with Dr. [place].

## 2019-09-04 NOTE — Progress Notes (Signed)
CARDIOLOGY OFFICE NOTE  Date:  09/05/2019    Scott [name] Date of Birth: [birth year] Medical Record #[id number]  PCP:  Kirby Funk, MD  Cardiologist:  Peter Swaziland MD  Chief Complaint  Patient presents with  . Coronary Artery Disease  . Hypertension  . Leg Swelling    History of Present Illness: Scott [name] is a 85 y.o. male who presents today for follow up CAD.  He has known CAD with remote CABG back in 2007 by  Dr. Tyrone Sage. Negative stress Myoview in 2010. Other issues include  HTN, HLD and OSA. Last echo in 2013.    He was admitted to the hospital in May 2020 with epigastric abdominal pain and found to have mild pancreatitis.  Lipase was elevated at 60.  COVID-19 test was negative.  CT of abdomen and pelvis revealed mild stranding about the head and neck of the pancreas most consistent was acute pancreatitis without pseudocyst or necrosis.  He was treated with bowel rest, IV fluid and pain med with improvement of the symptom.  EKG did not show acute changes.  Serial troponin during this admission was negative. His maxzide was stopped.  The patient reports that since his maxzide was stopped he has noted increased swelling in his legs and feet. He complains of increased SOB and gets tired easily. He denies any chest pain or palpitations. He was started on telmisartan 2 months ago and lasix 20 mg daily 1 month ago but symptoms have not improved. He has a rash on his feet as well.   Past Medical History:  Diagnosis Date  . Anemia    Acute blood loss anemia secondary to surgery, followed fr/ Hip replacement   . Anxiety    pt. reports that he has occas. nervous episodes   . BPH (benign prostatic hyperplasia)   . CAD (coronary artery disease)    s/p CABG in 2007  CARDILOLOGIST IS DR. P. Swaziland  . Cancer (HCC)    melanoma - removed L ear - 2010  . Cranial neuropathy    resolved   . Generalized OA   . GERD (gastroesophageal reflux disease)   . H/O echocardiogram    last echo, stress test 05/2012- pt. followed by [place]   . Hypercholesterolemia   . Hypertension   . Obesity   . Osteoarthritis    End-stage osteoarthritis, right hip  . Sleep apnea    with use of CPAP  . SOB (shortness of breath)    WITH EXERTION  . Thrombocytopenia (HCC)    possibly due to Lovenox    Past Surgical History:  Procedure Laterality Date  . ACHILLES TENDON SURGERY  10/06/2012   Procedure: ACHILLES TENDON REPAIR;  Surgeon: Thera Flake., MD;  Location: [facility] OR;  Service: Orthopedics;  Laterality: Right;  REPAIR RIGHT RUPTURE ACHILLES TENDON PRIMARY OPEN/PERCUTANEOUS, EXCISION PARTIAL BONE TALUS/CANCANEUS, REPAIR PARTIAL EXCISION CALCANEUS  . APPENDECTOMY    . ARTERY BIOPSY     temporal - L side- wnl, double vision treated /w prednisone - 1991  . CALCANEAL OSTEOTOMY  10/06/2012   Procedure: CALCANEAL OSTEOTOMY;  Surgeon: Thera Flake., MD;  Location: [facility] OR;  Service: Orthopedics;  Laterality: Right;  . CARDIAC CATHETERIZATION  12/15/2005   NORMAL. EF 60%, angioplasty 1995  . carpel tunnel surgery-bilateral     both hands   . CORONARY ARTERY BYPASS GRAFT  2007   LIMA GRAFT TO THE LAD, SAPHENOUS VEIN GRAFT TO THE FIRST OBTUSE MARIGNAL, SAPHENOUS VEIN  GRAFT TO THE SECOND OBTUSE MARGINAL AND A SAPHENOUS VEIN GRAFT TO THE ACUTE MARGINAL AND POSTERIOR LATERAL BRANCH  . HERNIA REPAIR    . I&D EXTREMITY  12/11/2012   Procedure: IRRIGATION AND DEBRIDEMENT EXTREMITY;  Surgeon: Nadara Mustard, MD;  Location: [facility] OR;  Service: Orthopedics;  Laterality: Right;  Irrigation and Debridement achilles, wound closure, apply wound vac, place antibiotic beads  . INGUINAL HERNIA REPAIR Left 05/10/2013   Procedure: HERNIA REPAIR INGUINAL ADULT;  Surgeon: Velora Heckler, MD;  Location: [facility] ORS;  Service: General;  Laterality: Left;  . INSERTION OF MESH Left 05/10/2013   Procedure: INSERTION OF MESH;  Surgeon: Velora Heckler, MD;  Location: [facility] ORS;  Service: General;  Laterality: Left;  . JOINT  REPLACEMENT    . knee  11/12   left arthroscopic  . TOTAL HIP ARTHROPLASTY     right     Medications: Current Outpatient Medications  Medication Sig Dispense Refill  . aspirin 81 MG tablet Take 81 mg by mouth every evening.     . finasteride (PROSCAR) 5 MG tablet Take 5 mg by mouth daily before breakfast.     . fish oil-omega-3 fatty acids 1000 MG capsule Take 1 g by mouth every other day.     . furosemide (LASIX) 20 MG tablet TAKE 1 TABLET BY MOUTH ONCE EACH MORNING AS NEEDED FOR SWELLING/EDEMA    . lovastatin (MEVACOR) 20 MG tablet Take 20 mg by mouth at bedtime.     . metoprolol (LOPRESSOR) 50 MG tablet Take 25 mg by mouth 2 (two) times daily.    . potassium chloride (K-DUR,KLOR-CON) 10 MEQ tablet Take 10 mEq by mouth every evening.     . Suvorexant (BELSOMRA) 15 MG TABS Take 1 tablet by mouth at bedtime as needed. (Patient taking differently: Take 1 tablet by mouth at bedtime as needed (insomnia). ) 30 tablet 5  . Tamsulosin HCl (FLOMAX) 0.4 MG CAPS Take 0.4 mg by mouth daily after supper.    . telmisartan (MICARDIS) 40 MG tablet Take 40 mg by mouth daily.    . traZODone (DESYREL) 50 MG tablet Take 1 tablet (50 mg total) by mouth at bedtime. 30 tablet prn  . triazolam (HALCION) 0.25 MG tablet Take 1 tablet (0.25 mg total) by mouth at bedtime as needed. (Patient taking differently: Take 0.25 mg by mouth at bedtime as needed for sleep. ) 30 tablet 5  . vitamin E 400 UNIT capsule Take 400 Units by mouth daily.     Marland Kitchen triamterene-hydrochlorothiazide (MAXZIDE-25) 37.5-25 MG tablet Take 1 tablet by mouth daily for 30 days. (Patient not taking: Reported on 09/05/2019) 30 tablet 0   No current facility-administered medications for this visit.     Allergies: Allergies  Allergen Reactions  . Acetaminophen     Other reaction(s): mouth sores  . Amlodipine Swelling    Swelling of feet and legs  . Bactrim [Sulfamethoxazole-Trimethoprim] Other (See Comments)    Constipation   . Codeine  Nausea Only  . Fexofenadine     Other reaction(s): mouth sores  . Ibuprofen     Other reaction(s): nervousness  . Lovenox [Enoxaparin Sodium] Other (See Comments)    MAY HAVE CAUSED THROMBOCYTOPENIA  . Naproxen     Other reaction(s): mouth sores  . Prednisone Swelling    Patient has tolerated doses for colds. Thinks it caused swelling in the past  . Shellfish Allergy Nausea And Vomiting and Other (See Comments)    N&V, shaking  .  Sonata [Zaleplon] Other (See Comments)    Hyper feelings    Social History: The patient  reports that he has never smoked. He has never used smokeless tobacco. He reports that he does not drink alcohol or use drugs.   Family History: The patient's family history includes Heart disease in his brother and brother; Prostate cancer in his brother.   Review of Systems: Please see the history of present illness.     All other systems are reviewed and negative.   Physical Exam: VS:  BP 132/66   Pulse 61   Temp (!) 96.6 F (35.9 C)   Ht 5\' 6"  (1.676 m)   Wt 180 lb 12.8 oz (82 kg)   SpO2 97%   BMI 29.18 kg/m  .  BMI Body mass index is 29.18 kg/m.  Wt Readings from Last 3 Encounters:  09/05/19 180 lb 12.8 oz (82 kg)  05/03/19 170 lb (77.1 kg)  04/18/19 171 lb 3.2 oz (77.7 kg)   GENERAL:  Well appearing WM in NAD HEENT:  PERRL, EOMI, sclera are clear. Oropharynx is clear. NECK:  No jugular venous distention, carotid upstroke brisk and symmetric, no bruits, no thyromegaly or adenopathy LUNGS:  Left faint rales.  CHEST:  Unremarkable HEART:  RRR,  PMI not displaced or sustained,S1 and S2 within normal limits, no S3, no S4: no clicks, no rubs, no murmurs ABD:  Soft, nontender. BS +, no masses or bruits. No hepatomegaly, no splenomegaly EXT:  2 + pulses throughout, 2+ pretibial  Edema with stasis dermatitis.  no cyanosis no clubbing SKIN:  Warm and dry.  No rashes NEURO:  Alert and oriented x 3. Cranial nerves II through XII intact. PSYCH:  Cognitively  intact   LABORATORY DATA:  EKG:  EKG is not ordered today.    Lab Results  Component Value Date   WBC 10.3 04/18/2019   HGB 12.1 (L) 04/18/2019   HCT 36.7 (L) 04/18/2019   PLT 102 (L) 04/18/2019   GLUCOSE 81 04/18/2019   CHOL 152 05/17/2011   TRIG 56 04/17/2019   HDL 40.30 05/17/2011   LDLCALC 82 05/17/2011   ALT 12 04/18/2019   AST 15 04/18/2019   NA 141 04/18/2019   K 3.3 (L) 04/18/2019   CL 102 04/18/2019   CREATININE 1.13 04/18/2019   BUN 9 04/18/2019   CO2 28 04/18/2019   INR 0.97 12/11/2012    BNP (last 3 results) No results for input(s): BNP in the last 8760 hours.  ProBNP (last 3 results) No results for input(s): PROBNP in the last 8760 hours.   Other Studies Reviewed Today:  Labs from primary care: 01/07/16: cholesterol 141, triglycerides 144, LDL 65, HDL 47.  09/10/16: BMET normal. Dated 11/16/17: cholesterol 161, triglycerides 134, HDL 51, LDL 84. BMET normal Dated 11/21/18: cholesterol 160, triglycerides 182, HDL 54, LDL 70. Potassium 3.3. creatinine normal.  Assessment/Plan: 1.  CAD with prior CABG back in 2007 - he remains asymptomatic. Continue medical therapy with ASA, beta blocker and statin.  2. LE edema and increased SOB. I am concerned he may have developed CHF. Other possibilities in include DVT. Low protein or nephrotic syndrome. Will check Chemistries, TSH, UA and CBC. Check BNP. Will arrange for LE venous dopplers and Echo. Increase lasix to 40 mg daily. Stressed importance of sodium restriction. Instructed to wear compression hose.   3. HLD - on statin therapy. Will update lipid panel  4. HTN controlled.   Disposition:   Follow up  in  one month    Signed: Peter Swaziland MD, [facility]  09/05/2019 12:44 PM  [facility]

## 2019-09-05 ENCOUNTER — Ambulatory Visit: Payer: Medicare HMO | Admitting: Cardiology

## 2019-09-05 ENCOUNTER — Ambulatory Visit (HOSPITAL_COMMUNITY)
Admission: RE | Admit: 2019-09-05 | Discharge: 2019-09-05 | Disposition: A | Discharge status: Home / Self Care | Payer: Medicare HMO | Source: Ambulatory Visit | Attending: Cardiovascular Disease | Admitting: Cardiovascular Disease

## 2019-09-05 ENCOUNTER — Other Ambulatory Visit: Payer: Self-pay

## 2019-09-05 ENCOUNTER — Encounter: Payer: Self-pay | Admitting: Cardiology

## 2019-09-05 VITALS — BP 132/66 | HR 61 | Temp 96.6°F | Ht 66.0 in | Wt 180.8 lb

## 2019-09-05 DIAGNOSIS — R600 Localized edema: Secondary | ICD-10-CM

## 2019-09-05 DIAGNOSIS — E785 Hyperlipidemia, unspecified: Secondary | ICD-10-CM

## 2019-09-05 DIAGNOSIS — I25810 Atherosclerosis of coronary artery bypass graft(s) without angina pectoris: Secondary | ICD-10-CM | POA: Diagnosis not present

## 2019-09-05 DIAGNOSIS — I10 Essential (primary) hypertension: Secondary | ICD-10-CM

## 2019-09-05 DIAGNOSIS — R0609 Other forms of dyspnea: Secondary | ICD-10-CM

## 2019-09-05 DIAGNOSIS — R0600 Dyspnea, unspecified: Secondary | ICD-10-CM

## 2019-09-05 NOTE — Patient Instructions (Signed)
Increase furosemide to 40 mg daily  We will check blood work today  We will schedule you for US of the veins in your legs and an Echocardiogram  Restrict salt intake.  Wear compression hose

## 2019-09-06 LAB — URINALYSIS
Bilirubin, UA: NEGATIVE
Glucose, UA: NEGATIVE
Ketones, UA: NEGATIVE
Leukocytes,UA: NEGATIVE
Nitrite, UA: NEGATIVE
Protein,UA: NEGATIVE
RBC, UA: NEGATIVE
Specific Gravity, UA: 1.017 (ref 1.005–1.030)
Urobilinogen, Ur: 0.2 mg/dL (ref 0.2–1.0)
pH, UA: 6 (ref 5.0–7.5)

## 2019-09-06 LAB — CBC WITH DIFFERENTIAL/PLATELET
Basophils Absolute: 0 10*3/uL (ref 0.0–0.2)
Basos: 0 %
EOS (ABSOLUTE): 0.4 10*3/uL (ref 0.0–0.4)
Eos: 6 %
Hematocrit: 41 % (ref 37.5–51.0)
Hemoglobin: 13.6 g/dL (ref 13.0–17.7)
Immature Grans (Abs): 0 10*3/uL (ref 0.0–0.1)
Immature Granulocytes: 1 %
Lymphocytes Absolute: 1.6 10*3/uL (ref 0.7–3.1)
Lymphs: 23 %
MCH: 32.2 pg (ref 26.6–33.0)
MCHC: 33.2 g/dL (ref 31.5–35.7)
MCV: 97 fL (ref 79–97)
Monocytes Absolute: 0.7 10*3/uL (ref 0.1–0.9)
Monocytes: 10 %
Neutrophils Absolute: 4.1 10*3/uL (ref 1.4–7.0)
Neutrophils: 60 %
Platelets: 122 10*3/uL — ABNORMAL LOW (ref 150–450)
RBC: 4.23 x10E6/uL (ref 4.14–5.80)
RDW: 12.3 % (ref 11.6–15.4)
WBC: 6.8 10*3/uL (ref 3.4–10.8)

## 2019-09-06 LAB — COMPREHENSIVE METABOLIC PANEL
ALT: 11 IU/L (ref 0–44)
AST: 17 IU/L (ref 0–40)
Albumin/Globulin Ratio: 1.9 (ref 1.2–2.2)
Albumin: 4.1 g/dL (ref 3.6–4.6)
Alkaline Phosphatase: 91 IU/L (ref 39–117)
BUN/Creatinine Ratio: 11 (ref 10–24)
BUN: 10 mg/dL (ref 8–27)
Bilirubin Total: 1.2 mg/dL (ref 0.0–1.2)
CO2: 27 mmol/L (ref 20–29)
Calcium: 9.3 mg/dL (ref 8.6–10.2)
Chloride: 100 mmol/L (ref 96–106)
Creatinine, Ser: 0.91 mg/dL (ref 0.76–1.27)
GFR calc Af Amer: 86 mL/min/{1.73_m2} (ref >59)
GFR calc non Af Amer: 74 mL/min/{1.73_m2} (ref >59)
Globulin, Total: 2.2 g/dL (ref 1.5–4.5)
Glucose: 101 mg/dL — ABNORMAL HIGH (ref 65–99)
Potassium: 4.1 mmol/L (ref 3.5–5.2)
Sodium: 141 mmol/L (ref 134–144)
Total Protein: 6.3 g/dL (ref 6.0–8.5)

## 2019-09-06 LAB — TSH: TSH: 1.75 u[IU]/mL (ref 0.450–4.500)

## 2019-09-06 LAB — BRAIN NATRIURETIC PEPTIDE: BNP: 93.2 pg/mL (ref 0.0–100.0)

## 2019-09-07 ENCOUNTER — Telehealth: Payer: Self-pay | Admitting: Cardiology

## 2019-09-07 NOTE — Telephone Encounter (Signed)
Returned call to patient no answer.LMTC. 

## 2019-09-07 NOTE — Telephone Encounter (Signed)
Follow Up: ° ° ° °Returning your call from yesterday. °

## 2019-09-07 NOTE — Telephone Encounter (Signed)
Spoke to patient lab results given. 

## 2019-09-10 ENCOUNTER — Ambulatory Visit (HOSPITAL_COMMUNITY): Payer: Medicare HMO | Attending: Cardiology

## 2019-09-10 ENCOUNTER — Other Ambulatory Visit: Payer: Self-pay

## 2019-09-10 DIAGNOSIS — R600 Localized edema: Secondary | ICD-10-CM | POA: Diagnosis not present

## 2019-09-10 DIAGNOSIS — R0600 Dyspnea, unspecified: Secondary | ICD-10-CM | POA: Diagnosis not present

## 2019-09-10 DIAGNOSIS — E785 Hyperlipidemia, unspecified: Secondary | ICD-10-CM | POA: Diagnosis not present

## 2019-09-10 DIAGNOSIS — R0609 Other forms of dyspnea: Secondary | ICD-10-CM

## 2019-09-10 DIAGNOSIS — I10 Essential (primary) hypertension: Secondary | ICD-10-CM | POA: Diagnosis not present

## 2019-09-10 DIAGNOSIS — I25810 Atherosclerosis of coronary artery bypass graft(s) without angina pectoris: Secondary | ICD-10-CM | POA: Insufficient documentation

## 2019-09-12 ENCOUNTER — Other Ambulatory Visit: Payer: Self-pay | Admitting: Internal Medicine

## 2019-09-12 NOTE — Telephone Encounter (Signed)
Halcion refill e-sent

## 2019-09-12 NOTE — Telephone Encounter (Signed)
CY please advise on refill. Last refill 02/2019 with 6 refills. Last OV 01/09/2019  Current Outpatient Medications on File Prior to Visit  Medication Sig Dispense Refill  . aspirin 81 MG tablet Take 81 mg by mouth every evening.     . finasteride (PROSCAR) 5 MG tablet Take 5 mg by mouth daily before breakfast.     . fish oil-omega-3 fatty acids 1000 MG capsule Take 1 g by mouth every other day.     . furosemide (LASIX) 20 MG tablet TAKE 1 TABLET BY MOUTH ONCE EACH MORNING AS NEEDED FOR SWELLING/EDEMA    . lovastatin (MEVACOR) 20 MG tablet Take 20 mg by mouth at bedtime.     . metoprolol (LOPRESSOR) 50 MG tablet Take 25 mg by mouth 2 (two) times daily.    . potassium chloride (K-DUR,KLOR-CON) 10 MEQ tablet Take 10 mEq by mouth every evening.     . Suvorexant (BELSOMRA) 15 MG TABS Take 1 tablet by mouth at bedtime as needed. (Patient taking differently: Take 1 tablet by mouth at bedtime as needed (insomnia). ) 30 tablet 5  . Tamsulosin HCl (FLOMAX) 0.4 MG CAPS Take 0.4 mg by mouth daily after supper.    . telmisartan (MICARDIS) 40 MG tablet Take 40 mg by mouth daily.    . traZODone (DESYREL) 50 MG tablet Take 1 tablet (50 mg total) by mouth at bedtime. 30 tablet prn  . triamterene-hydrochlorothiazide (MAXZIDE-25) 37.5-25 MG tablet Take 1 tablet by mouth daily for 30 days. (Patient not taking: Reported on 09/05/2019) 30 tablet 0  . triazolam (HALCION) 0.25 MG tablet Take 1 tablet (0.25 mg total) by mouth at bedtime as needed. (Patient taking differently: Take 0.25 mg by mouth at bedtime as needed for sleep. ) 30 tablet 5  . vitamin E 400 UNIT capsule Take 400 Units by mouth daily.      No current facility-administered medications on file prior to visit.   ' Allergies  Allergen Reactions  . Acetaminophen     Other reaction(s): mouth sores  . Amlodipine Swelling    Swelling of feet and legs  . Bactrim [Sulfamethoxazole-Trimethoprim] Other (See Comments)    Constipation   . Codeine Nausea Only  .  Fexofenadine     Other reaction(s): mouth sores  . Ibuprofen     Other reaction(s): nervousness  . Lovenox [Enoxaparin Sodium] Other (See Comments)    MAY HAVE CAUSED THROMBOCYTOPENIA  . Naproxen     Other reaction(s): mouth sores  . Prednisone Swelling    Patient has tolerated doses for colds. Thinks it caused swelling in the past  . Shellfish Allergy Nausea And Vomiting and Other (See Comments)    N&V, shaking  . Sonata [Zaleplon] Other (See Comments)    Hyper feelings

## 2019-09-13 DIAGNOSIS — M1711 Unilateral primary osteoarthritis, right knee: Secondary | ICD-10-CM | POA: Diagnosis not present

## 2019-09-13 DIAGNOSIS — M25561 Pain in right knee: Secondary | ICD-10-CM | POA: Diagnosis not present

## 2019-09-21 DIAGNOSIS — N400 Enlarged prostate without lower urinary tract symptoms: Secondary | ICD-10-CM | POA: Diagnosis not present

## 2019-09-21 DIAGNOSIS — M169 Osteoarthritis of hip, unspecified: Secondary | ICD-10-CM | POA: Diagnosis not present

## 2019-09-21 DIAGNOSIS — I10 Essential (primary) hypertension: Secondary | ICD-10-CM | POA: Diagnosis not present

## 2019-09-21 DIAGNOSIS — E7800 Pure hypercholesterolemia, unspecified: Secondary | ICD-10-CM | POA: Diagnosis not present

## 2019-09-21 DIAGNOSIS — I2510 Atherosclerotic heart disease of native coronary artery without angina pectoris: Secondary | ICD-10-CM | POA: Diagnosis not present

## 2019-09-21 DIAGNOSIS — M179 Osteoarthritis of knee, unspecified: Secondary | ICD-10-CM | POA: Diagnosis not present

## 2019-10-04 DIAGNOSIS — M25512 Pain in left shoulder: Secondary | ICD-10-CM | POA: Diagnosis not present

## 2019-10-04 DIAGNOSIS — M19012 Primary osteoarthritis, left shoulder: Secondary | ICD-10-CM | POA: Diagnosis not present

## 2019-10-06 NOTE — Progress Notes (Signed)
CARDIOLOGY OFFICE NOTE  Date:  10/06/2019    Scott [name] Date of Birth: 1928-05-13 Medical Record #[id number]  PCP:  Kirby Funk, MD  Cardiologist:  Raysean Graumann [place] MD  No chief complaint on file.   History of Present Illness: Scott [name] is a 85 y.o. male who presents today for follow up CAD and edema.  He has known CAD with remote CABG back in 2007 by  Dr. Tyrone Sage. Negative stress Myoview in 2010. Other issues include  HTN, HLD and OSA. Last echo in 2013.    He was admitted to the hospital in May 2020 with epigastric abdominal pain and found to have mild pancreatitis.  Lipase was elevated at 60.  COVID-19 test was negative.  CT of abdomen and pelvis revealed mild stranding about the head and neck of the pancreas most consistent was acute pancreatitis without pseudocyst or necrosis.  He was treated with bowel rest, IV fluid and pain med with improvement of the symptom.  EKG did not show acute changes.  Serial troponin during this admission was negative. His maxzide was stopped.  He was seen one month ago with complaint that since his maxzide was stopped he has noted increased swelling in his legs and feet. He complained of increased SOB and gets tired easily. He denies any chest pain or palpitations. He was started on telmisartan 2 months ago and lasix 20 mg daily 1 month ago but symptoms have not improved. He has a rash on his feet as well.   We performed evaluation with lab work including Chemistries, TSH, CBC,UA,  and BNP which were all normal except chronically low platelets. LE dopplers were negative for DVT. Echo was fairly unremarkable with gr 2 diastolic dysfunction and mild pulmonary HTN. EF was normal. He has been using lasix prn with improvement in edema and denies current SOB.   Past Medical History:  Diagnosis Date  . Anemia    Acute blood loss anemia secondary to surgery, followed fr/ Hip replacement   . Anxiety    pt. reports that he has occas. nervous  episodes   . BPH (benign prostatic hyperplasia)   . CAD (coronary artery disease)    s/p CABG in 2007  CARDILOLOGIST IS DR. P. [place]  . Cancer (HCC)    melanoma - removed L ear - 2010  . Cranial neuropathy    resolved   . Generalized OA   . GERD (gastroesophageal reflux disease)   . H/O echocardiogram    last echo, stress test 05/2012- pt. followed by [facility]   . Hypercholesterolemia   . Hypertension   . Obesity   . Osteoarthritis    End-stage osteoarthritis, right hip  . Sleep apnea    with use of CPAP  . SOB (shortness of breath)    WITH EXERTION  . Thrombocytopenia (HCC)    possibly due to Lovenox    Past Surgical History:  Procedure Laterality Date  . ACHILLES TENDON SURGERY  10/06/2012   Procedure: ACHILLES TENDON REPAIR;  Surgeon: Thera Flake., MD;  Location: [facility] OR;  Service: Orthopedics;  Laterality: Right;  REPAIR RIGHT RUPTURE ACHILLES TENDON PRIMARY OPEN/PERCUTANEOUS, EXCISION PARTIAL BONE TALUS/CANCANEUS, REPAIR PARTIAL EXCISION CALCANEUS  . APPENDECTOMY    . ARTERY BIOPSY     temporal - L side- wnl, double vision treated /w prednisone - 1991  . CALCANEAL OSTEOTOMY  10/06/2012   Procedure: CALCANEAL OSTEOTOMY;  Surgeon: Thera Flake., MD;  Location: [facility] OR;  Service: Orthopedics;  Laterality: Right;  . CARDIAC CATHETERIZATION  12/15/2005   NORMAL. EF 60%, angioplasty 1995  . carpel tunnel surgery-bilateral     both hands   . CORONARY ARTERY BYPASS GRAFT  2007   LIMA GRAFT TO THE LAD, SAPHENOUS VEIN GRAFT TO THE FIRST OBTUSE MARIGNAL, SAPHENOUS VEIN GRAFT TO THE SECOND OBTUSE MARGINAL AND A SAPHENOUS VEIN GRAFT TO THE ACUTE MARGINAL AND POSTERIOR LATERAL BRANCH  . HERNIA REPAIR    . I&D EXTREMITY  12/11/2012   Procedure: IRRIGATION AND DEBRIDEMENT EXTREMITY;  Surgeon: Nadara Mustard, MD;  Location: [facility] OR;  Service: Orthopedics;  Laterality: Right;  Irrigation and Debridement achilles, wound closure, apply wound vac, place antibiotic beads  . INGUINAL HERNIA REPAIR  Left 05/10/2013   Procedure: HERNIA REPAIR INGUINAL ADULT;  Surgeon: Velora Heckler, MD;  Location: [facility] ORS;  Service: General;  Laterality: Left;  . INSERTION OF MESH Left 05/10/2013   Procedure: INSERTION OF MESH;  Surgeon: Velora Heckler, MD;  Location: [facility] ORS;  Service: General;  Laterality: Left;  . JOINT REPLACEMENT    . knee  11/12   left arthroscopic  . TOTAL HIP ARTHROPLASTY     right     Medications: Current Outpatient Medications  Medication Sig Dispense Refill  . aspirin 81 MG tablet Take 81 mg by mouth every evening.     . finasteride (PROSCAR) 5 MG tablet Take 5 mg by mouth daily before breakfast.     . fish oil-omega-3 fatty acids 1000 MG capsule Take 1 g by mouth every other day.     . furosemide (LASIX) 20 MG tablet TAKE 1 TABLET BY MOUTH ONCE EACH MORNING AS NEEDED FOR SWELLING/EDEMA    . lovastatin (MEVACOR) 20 MG tablet Take 20 mg by mouth at bedtime.     . metoprolol (LOPRESSOR) 50 MG tablet Take 25 mg by mouth 2 (two) times daily.    . potassium chloride (K-DUR,KLOR-CON) 10 MEQ tablet Take 10 mEq by mouth every evening.     . Suvorexant (BELSOMRA) 15 MG TABS Take 1 tablet by mouth at bedtime as needed. (Patient taking differently: Take 1 tablet by mouth at bedtime as needed (insomnia). ) 30 tablet 5  . Tamsulosin HCl (FLOMAX) 0.4 MG CAPS Take 0.4 mg by mouth daily after supper.    . telmisartan (MICARDIS) 40 MG tablet Take 40 mg by mouth daily.    . traZODone (DESYREL) 50 MG tablet Take 1 tablet (50 mg total) by mouth at bedtime. 30 tablet prn  . triamterene-hydrochlorothiazide (MAXZIDE-25) 37.5-25 MG tablet Take 1 tablet by mouth daily for 30 days. (Patient not taking: Reported on 09/05/2019) 30 tablet 0  . triazolam (HALCION) 0.25 MG tablet TAKE 1 TABLET BY MOUTH AT BEDTIME AS NEEDED 30 tablet 5  . vitamin E 400 UNIT capsule Take 400 Units by mouth daily.      No current facility-administered medications for this visit.     Allergies: Allergies  Allergen Reactions   . Acetaminophen     Other reaction(s): mouth sores  . Amlodipine Swelling    Swelling of feet and legs  . Bactrim [Sulfamethoxazole-Trimethoprim] Other (See Comments)    Constipation   . Codeine Nausea Only  . Fexofenadine     Other reaction(s): mouth sores  . Ibuprofen     Other reaction(s): nervousness  . Lovenox [Enoxaparin Sodium] Other (See Comments)    MAY HAVE CAUSED THROMBOCYTOPENIA  . Naproxen     Other reaction(s): mouth sores  . Prednisone  Swelling    Patient has tolerated doses for colds. Thinks it caused swelling in the past  . Shellfish Allergy Nausea And Vomiting and Other (See Comments)    N&V, shaking  . Sonata [Zaleplon] Other (See Comments)    Hyper feelings    Social History: The patient  reports that he has never smoked. He has never used smokeless tobacco. He reports that he does not drink alcohol or use drugs.   Family History: The patient's family history includes Heart disease in his brother and brother; Prostate cancer in his brother.   Review of Systems: Please see the history of present illness.     All other systems are reviewed and negative.   Physical Exam: VS:  There were no vitals taken for this visit. Marland Kitchen  BMI There is no height or weight on file to calculate BMI.  Wt Readings from Last 3 Encounters:  09/05/19 180 lb 12.8 oz (82 kg)  05/03/19 170 lb (77.1 kg)  04/18/19 171 lb 3.2 oz (77.7 kg)   GENERAL:  Well appearing WM in NAD HEENT:  PERRL, EOMI, sclera are clear. Oropharynx is clear. NECK:  No jugular venous distention, carotid upstroke brisk and symmetric, no bruits, no thyromegaly or adenopathy LUNGS:  Left faint rales.  CHEST:  Unremarkable HEART:  RRR,  PMI not displaced or sustained,S1 and S2 within normal limits, no S3, no S4: no clicks, no rubs, no murmurs ABD:  Soft, nontender. BS +, no masses or bruits. No hepatomegaly, no splenomegaly EXT:  2 + pulses throughout, trace pretibial  Edema with stasis dermatitis.  no cyanosis  no clubbing SKIN:  Warm and dry.  No rashes NEURO:  Alert and oriented x 3. Cranial nerves II through XII intact. PSYCH:  Cognitively intact   LABORATORY DATA:  EKG:  EKG is not ordered today.    Lab Results  Component Value Date   WBC 6.8 09/05/2019   HGB 13.6 09/05/2019   HCT 41.0 09/05/2019   PLT 122 (L) 09/05/2019   GLUCOSE 101 (H) 09/05/2019   CHOL 152 05/17/2011   TRIG 56 04/17/2019   HDL 40.30 05/17/2011   LDLCALC 82 05/17/2011   ALT 11 09/05/2019   AST 17 09/05/2019   NA 141 09/05/2019   K 4.1 09/05/2019   CL 100 09/05/2019   CREATININE 0.91 09/05/2019   BUN 10 09/05/2019   CO2 27 09/05/2019   TSH 1.750 09/05/2019   INR 0.97 12/11/2012    BNP (last 3 results) Recent Labs    09/05/19 1054  BNP 93.2    ProBNP (last 3 results) No results for input(s): PROBNP in the last 8760 hours.   Other Studies Reviewed Today:  Labs from primary care: 01/07/16: cholesterol 141, triglycerides 144, LDL 65, HDL 47.  09/10/16: BMET normal. Dated 11/16/17: cholesterol 161, triglycerides 134, HDL 51, LDL 84. BMET normal Dated 11/21/18: cholesterol 160, triglycerides 182, HDL 54, LDL 70. Potassium 3.3. creatinine normal.  Echo 09/10/19: IMPRESSIONS    1. Left ventricular ejection fraction, by visual estimation, is 60 to 65%. The left ventricle has normal function. Normal left ventricular size. There is mildly increased left ventricular hypertrophy.  2. Left ventricular diastolic Doppler parameters are consistent with impaired relaxation pattern of LV diastolic filling.  3. Global right ventricle has normal systolic function.The right ventricular size is mildly enlarged. No increase in right ventricular wall thickness.  4. Left atrial size was normal.  5. Right atrial size was normal.  6. The mitral valve  is normal in structure. Mild mitral valve regurgitation. No evidence of mitral stenosis.  7. The tricuspid valve is normal in structure. Tricuspid valve regurgitation is  trivial.  8. The aortic valve is normal in structure. Aortic valve regurgitation was not visualized by color flow Doppler. Structurally normal aortic valve, with no evidence of sclerosis or stenosis.  9. The pulmonic valve was normal in structure. Pulmonic valve regurgitation is trivial by color flow Doppler. 10. There is mild dilatation of the aortic root measuring 42 mm. 11. Mildly elevated pulmonary artery systolic pressure. 12. The tricuspid regurgitant velocity is 2.71 m/s, and with an assumed right atrial pressure of 8 mmHg, the estimated right ventricular systolic pressure is mildly elevated at 37.4 mmHg. 13. The inferior vena cava is normal in size with greater than 50% respiratory variability, suggesting right atrial pressure of 3 mmHg.  In comparison to the previous echocardiogram(s): 05/23/12 EF 55-65%. PA pressure .  Assessment/Plan: 1.  CAD with prior CABG back in 2007 - he remains asymptomatic. Continue medical therapy with ASA, beta blocker and statin.  2. LE edema and SOB. Extensive evaluation as noted above. Really unremarkable. Will continue to use lasix prn. Stressed importance of sodium restriction. Instructed to wear compression hose.   3. HLD - on statin therapy.   4. HTN controlled.   Disposition:   Follow up  in 6 months    Signed: Mavis Gravelle [place] MD, [facility]  10/06/2019 4:30 PM  [facility]

## 2019-10-09 ENCOUNTER — Ambulatory Visit: Payer: Medicare HMO | Admitting: Cardiology

## 2019-10-09 ENCOUNTER — Other Ambulatory Visit: Payer: Self-pay

## 2019-10-09 ENCOUNTER — Encounter: Payer: Self-pay | Admitting: Cardiology

## 2019-10-09 VITALS — BP 120/60 | HR 66 | Ht 66.0 in | Wt 183.0 lb

## 2019-10-09 DIAGNOSIS — E7800 Pure hypercholesterolemia, unspecified: Secondary | ICD-10-CM | POA: Diagnosis not present

## 2019-10-09 DIAGNOSIS — I25810 Atherosclerosis of coronary artery bypass graft(s) without angina pectoris: Secondary | ICD-10-CM | POA: Diagnosis not present

## 2019-10-09 DIAGNOSIS — R600 Localized edema: Secondary | ICD-10-CM

## 2019-10-09 DIAGNOSIS — I10 Essential (primary) hypertension: Secondary | ICD-10-CM

## 2019-11-09 DIAGNOSIS — Z85820 Personal history of malignant melanoma of skin: Secondary | ICD-10-CM | POA: Diagnosis not present

## 2019-11-09 DIAGNOSIS — Z08 Encounter for follow-up examination after completed treatment for malignant neoplasm: Secondary | ICD-10-CM | POA: Diagnosis not present

## 2019-11-09 DIAGNOSIS — Z1283 Encounter for screening for malignant neoplasm of skin: Secondary | ICD-10-CM | POA: Diagnosis not present

## 2019-11-09 DIAGNOSIS — C4441 Basal cell carcinoma of skin of scalp and neck: Secondary | ICD-10-CM | POA: Diagnosis not present

## 2019-11-09 DIAGNOSIS — L570 Actinic keratosis: Secondary | ICD-10-CM | POA: Diagnosis not present

## 2019-11-09 DIAGNOSIS — X32XXXD Exposure to sunlight, subsequent encounter: Secondary | ICD-10-CM | POA: Diagnosis not present

## 2019-12-13 DIAGNOSIS — N400 Enlarged prostate without lower urinary tract symptoms: Secondary | ICD-10-CM | POA: Diagnosis not present

## 2019-12-13 DIAGNOSIS — R69 Illness, unspecified: Secondary | ICD-10-CM | POA: Diagnosis not present

## 2019-12-13 DIAGNOSIS — E7800 Pure hypercholesterolemia, unspecified: Secondary | ICD-10-CM | POA: Diagnosis not present

## 2019-12-13 DIAGNOSIS — I10 Essential (primary) hypertension: Secondary | ICD-10-CM | POA: Diagnosis not present

## 2019-12-13 DIAGNOSIS — R609 Edema, unspecified: Secondary | ICD-10-CM | POA: Diagnosis not present

## 2019-12-13 DIAGNOSIS — I2510 Atherosclerotic heart disease of native coronary artery without angina pectoris: Secondary | ICD-10-CM | POA: Diagnosis not present

## 2019-12-13 DIAGNOSIS — G4733 Obstructive sleep apnea (adult) (pediatric): Secondary | ICD-10-CM | POA: Diagnosis not present

## 2019-12-13 DIAGNOSIS — Z0000 Encounter for general adult medical examination without abnormal findings: Secondary | ICD-10-CM | POA: Diagnosis not present

## 2019-12-13 DIAGNOSIS — Z1389 Encounter for screening for other disorder: Secondary | ICD-10-CM | POA: Diagnosis not present

## 2019-12-13 DIAGNOSIS — K219 Gastro-esophageal reflux disease without esophagitis: Secondary | ICD-10-CM | POA: Diagnosis not present

## 2019-12-15 DIAGNOSIS — I10 Essential (primary) hypertension: Secondary | ICD-10-CM | POA: Diagnosis not present

## 2019-12-15 DIAGNOSIS — E785 Hyperlipidemia, unspecified: Secondary | ICD-10-CM | POA: Diagnosis not present

## 2019-12-15 DIAGNOSIS — G8929 Other chronic pain: Secondary | ICD-10-CM | POA: Diagnosis not present

## 2019-12-15 DIAGNOSIS — G4700 Insomnia, unspecified: Secondary | ICD-10-CM | POA: Diagnosis not present

## 2019-12-15 DIAGNOSIS — E669 Obesity, unspecified: Secondary | ICD-10-CM | POA: Diagnosis not present

## 2019-12-15 DIAGNOSIS — I739 Peripheral vascular disease, unspecified: Secondary | ICD-10-CM | POA: Diagnosis not present

## 2019-12-15 DIAGNOSIS — K5900 Constipation, unspecified: Secondary | ICD-10-CM | POA: Diagnosis not present

## 2019-12-15 DIAGNOSIS — G629 Polyneuropathy, unspecified: Secondary | ICD-10-CM | POA: Diagnosis not present

## 2019-12-15 DIAGNOSIS — H269 Unspecified cataract: Secondary | ICD-10-CM | POA: Diagnosis not present

## 2019-12-15 DIAGNOSIS — I2510 Atherosclerotic heart disease of native coronary artery without angina pectoris: Secondary | ICD-10-CM | POA: Diagnosis not present

## 2019-12-21 DIAGNOSIS — Z85828 Personal history of other malignant neoplasm of skin: Secondary | ICD-10-CM | POA: Diagnosis not present

## 2019-12-21 DIAGNOSIS — Z08 Encounter for follow-up examination after completed treatment for malignant neoplasm: Secondary | ICD-10-CM | POA: Diagnosis not present

## 2020-01-02 DIAGNOSIS — E7800 Pure hypercholesterolemia, unspecified: Secondary | ICD-10-CM | POA: Diagnosis not present

## 2020-01-02 DIAGNOSIS — I2510 Atherosclerotic heart disease of native coronary artery without angina pectoris: Secondary | ICD-10-CM | POA: Diagnosis not present

## 2020-01-02 DIAGNOSIS — M169 Osteoarthritis of hip, unspecified: Secondary | ICD-10-CM | POA: Diagnosis not present

## 2020-01-02 DIAGNOSIS — I10 Essential (primary) hypertension: Secondary | ICD-10-CM | POA: Diagnosis not present

## 2020-01-02 DIAGNOSIS — N400 Enlarged prostate without lower urinary tract symptoms: Secondary | ICD-10-CM | POA: Diagnosis not present

## 2020-01-02 DIAGNOSIS — M179 Osteoarthritis of knee, unspecified: Secondary | ICD-10-CM | POA: Diagnosis not present

## 2020-01-09 DIAGNOSIS — E7800 Pure hypercholesterolemia, unspecified: Secondary | ICD-10-CM | POA: Diagnosis not present

## 2020-01-09 DIAGNOSIS — M169 Osteoarthritis of hip, unspecified: Secondary | ICD-10-CM | POA: Diagnosis not present

## 2020-01-09 DIAGNOSIS — I10 Essential (primary) hypertension: Secondary | ICD-10-CM | POA: Diagnosis not present

## 2020-01-09 DIAGNOSIS — M179 Osteoarthritis of knee, unspecified: Secondary | ICD-10-CM | POA: Diagnosis not present

## 2020-01-09 DIAGNOSIS — N400 Enlarged prostate without lower urinary tract symptoms: Secondary | ICD-10-CM | POA: Diagnosis not present

## 2020-01-09 DIAGNOSIS — I2510 Atherosclerotic heart disease of native coronary artery without angina pectoris: Secondary | ICD-10-CM | POA: Diagnosis not present

## 2020-01-10 ENCOUNTER — Telehealth: Payer: Self-pay | Admitting: Internal Medicine

## 2020-01-10 ENCOUNTER — Ambulatory Visit: Payer: Medicare HMO | Admitting: Internal Medicine

## 2020-01-10 ENCOUNTER — Encounter: Payer: Self-pay | Admitting: Internal Medicine

## 2020-01-10 ENCOUNTER — Other Ambulatory Visit: Payer: Self-pay

## 2020-01-10 DIAGNOSIS — R0602 Shortness of breath: Secondary | ICD-10-CM | POA: Diagnosis not present

## 2020-01-10 DIAGNOSIS — G4733 Obstructive sleep apnea (adult) (pediatric): Secondary | ICD-10-CM

## 2020-01-10 DIAGNOSIS — F5101 Primary insomnia: Secondary | ICD-10-CM | POA: Diagnosis not present

## 2020-01-10 DIAGNOSIS — R69 Illness, unspecified: Secondary | ICD-10-CM | POA: Diagnosis not present

## 2020-01-10 DIAGNOSIS — I2510 Atherosclerotic heart disease of native coronary artery without angina pectoris: Secondary | ICD-10-CM

## 2020-01-10 MED ORDER — BELSOMRA 15 MG PO TABS: 1.0000 | ORAL_TABLET | Freq: Every evening | ORAL | 1 refills | Status: DC | PRN

## 2020-01-10 NOTE — Telephone Encounter (Signed)
Called and spoke to [place]. Informed her of the recs per CY, she made note within the chart. Nothing further needed at this time.

## 2020-01-10 NOTE — Telephone Encounter (Signed)
Ok to [employer]. Please tell him not to combine sleep meds.

## 2020-01-10 NOTE — Telephone Encounter (Signed)
Received call from [facility], pharmacist with [facility]. She is calling to get clearance on pt's newly scripted Belsomra. Pt is also on Restoril 30mg  (last filled for #30 on 1/10 by Dr. Laurann Montana) and pt is also on Halcion 0.25mg  (last filled for #30 on 1/26). Tye [place] is needing clearance to fill this as it is the third hypnotic pt is taking. Halcion and Restoril are not on pt's med list with Epic, will add this. Pt is currently at pharmacy.    Dr. Annamaria Boots please advise on Belsomra. Thanks.   Allergies  Allergen Reactions  . Acetaminophen     Other reaction(s): mouth sores  . Amlodipine Swelling    Swelling of feet and legs  . Bactrim [Sulfamethoxazole-Trimethoprim] Other (See Comments)    Constipation   . Codeine Nausea Only  . Fexofenadine     Other reaction(s): mouth sores  . Ibuprofen     Other reaction(s): nervousness  . Lovenox [Enoxaparin Sodium] Other (See Comments)    MAY HAVE CAUSED THROMBOCYTOPENIA  . Naproxen     Other reaction(s): mouth sores  . Prednisone Swelling    Patient has tolerated doses for colds. Thinks it caused swelling in the past  . Shellfish Allergy Nausea And Vomiting and Other (See Comments)    N&V, shaking  . Sonata [Zaleplon] Other (See Comments)    Hyper feelings    Current Outpatient Medications on File Prior to Visit  Medication Sig Dispense Refill  . aspirin 81 MG tablet Take 81 mg by mouth every evening.     . finasteride (PROSCAR) 5 MG tablet Take 5 mg by mouth daily before breakfast.     . fish oil-omega-3 fatty acids 1000 MG capsule Take 1 g by mouth every other day.     . furosemide (LASIX) 20 MG tablet TAKE 1 TABLET BY MOUTH ONCE EACH MORNING AS NEEDED FOR SWELLING/EDEMA    . hydrochlorothiazide (HYDRODIURIL) 12.5 MG tablet Take 12.5 mg by mouth every morning.    . Influenza Vac High-Dose Quad (FLUZONE HIGH-DOSE QUADRIVALENT) 0.7 ML SUSY Fluzone High-Dose Quad 2020-21 (PF) 240 mcg/0.7 mL IM syringe  PHARMACY ADMINISTERED    . lovastatin (MEVACOR) 20  MG tablet Take 20 mg by mouth at bedtime.     . metoprolol (LOPRESSOR) 50 MG tablet Take 25 mg by mouth 2 (two) times daily.    . potassium chloride (K-DUR,KLOR-CON) 10 MEQ tablet Take 10 mEq by mouth every evening.     . potassium chloride (KLOR-CON) 10 MEQ tablet potassium chloride ER 10 mEq tablet,extended release  TK 1 T PO QD    . Suvorexant (BELSOMRA) 15 MG TABS Take 1 tablet by mouth at bedtime as needed. 15 tablet 1  . Tamsulosin HCl (FLOMAX) 0.4 MG CAPS Take 0.4 mg by mouth daily after supper.    . telmisartan (MICARDIS) 40 MG tablet Take 40 mg by mouth daily.    . traZODone (DESYREL) 50 MG tablet Take 1 tablet (50 mg total) by mouth at bedtime. 30 tablet prn  . vitamin E 400 UNIT capsule Take 400 Units by mouth daily.      No current facility-administered medications on file prior to visit.

## 2020-01-10 NOTE — Progress Notes (Signed)
HPI  M never smoker followed for OSA, insomnia, rhinitis, complicated by CAD/CABG NPSG 10/14/2002: AHI of 15.9 per hour; CPAP titrated to 8 CWP   body weight 180 pounds NPSG 07/30/12- AHI 6.7 / hr  desat <88% for 37.3 minutes without CPAP  -------------------------------------------------------------------------   01/09/2019- 86 year old male never smoker followed for OSA, Insomnia, rhinitis, complicated by CAD/CABG -----no complaints with CPAP - still some difficulty sleeping  CPAP auto 5-15/[employer]-replaced in 2018 Download 90% compliance AHI 0.9/hour He is satisfied with his replacement CPAP machine from last visit, with AutoPap. Still has trouble initiating and maintaining sleep.  Sleeps about 4 to 5 hours then cannot get back to sleep.  Never naps.  He thinks Halcion bothers his memory with sustained use and trazodone causes "crazy dreams".  He has tried other meds in the past.  We discussed possible use of Belsomra.  01/10/20- 86 year old male never smoker followed for OSA, Insomnia, rhinitis, complicated by CAD/CABG, HTN, GERD, BPH, thrombocytopenia, Pancreatitis,  CPAP "aggravating" - not using much in recent weeks CPAP 10/ [employer]- replaced in 2018 Download compliance 100%, AHI 0.5/ hr                 Wife here Wears about 4 hours, but sleeps better w/o CPAP. Restless and moving. Notes some wheeze/ whistle in the evening as he tries to sleep. Says "every little thing bothers me". After pancreatitis his diuretic was changed and he notes peripheral edema now.  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  ROS-see HPI + = positive Constitutional:   No-   weight loss, night sweats, fevers, chills, +fatigue, lassitude. HEENT:   No-  headaches, difficulty swallowing, tooth/dental problems, sore throat,       No-  sneezing, itching, ear ache, +nasal congestion, post nasal drip,  CV:  No-   chest pain, orthopnea, PND, +swelling in lower extremities, No-anasarca, dizziness,  palpitations Resp: +  shortness of breath with exertion or at rest.              No-   productive cough,  No non-productive cough,  No- coughing up of blood.              No-   change in color of mucus.  No- wheezing.   Skin: No-   rash or lesions. GI:  No-   heartburn, indigestion, abdominal pain, nausea, vomiting,  GU: n. MS:  No-   joint pain or swelling.   Neuro-     nothing unusual Psych:  No- change in mood or affect. No depression or anxiety.  No memory loss.  OBJ- Physical Exam General- Alert, Oriented, Affect-appropriate, Distress- none acute,  Skin- rash-none, lesions- none, excoriation- none Lymphadenopathy- none Head- atraumatic            Eyes- Gross vision intact, PERRLA, conjunctivae and secretions clear            Ears- Hearing, canals-normal            Nose- Clear, no-Septal dev, mucus, polyps, erosion, perforation             Throat- Mallampati III-IV , mucosa clear , drainage- none, tonsils- atrophic Neck- flexible , trachea midline, no stridor , thyroid nl, carotid no bruit Chest - symmetrical excursion , unlabored           Heart/CV- RRR , no murmur , no gallop  , no rub, nl s1 s2                           -  JVD- none , edema+1, stasis changes- none, varices- none           Lung- clear to P&A, wheeze- none, cough- none , dullness-none, rub- none           Chest wall-  Abd-  Br/ Gen/ Rectal- Not done, not indicated Extrem- cyanosis- none, clubbing, none, atrophy- none, strength- nl Neuro- grossly intact to observation

## 2020-01-15 DIAGNOSIS — R1013 Epigastric pain: Secondary | ICD-10-CM | POA: Diagnosis not present

## 2020-01-15 DIAGNOSIS — I10 Essential (primary) hypertension: Secondary | ICD-10-CM | POA: Diagnosis not present

## 2020-01-16 DIAGNOSIS — I10 Essential (primary) hypertension: Secondary | ICD-10-CM | POA: Diagnosis not present

## 2020-01-16 DIAGNOSIS — R1013 Epigastric pain: Secondary | ICD-10-CM | POA: Diagnosis not present

## 2020-01-18 NOTE — Assessment & Plan Note (Signed)
When last tested he had very mild OSA, AHI 6.7.  Download looks good and I think he is using CPAP more than he realizes. Nonspecific irritability is problem. History of working 3rd and rotating shifts suggests some circadian rhythm instability.  Plan- Try continuing CPAP, but recognize it may not be medically very important. Try Belsomra for insomnia.

## 2020-01-18 NOTE — Assessment & Plan Note (Signed)
Reviewed sleep hygiene. Try Belsomra

## 2020-01-18 NOTE — Assessment & Plan Note (Signed)
Describes some wheeze as he lies down, and noting some peripheral edema. Consider possible CHF for him to discuss with PCP

## 2020-01-18 NOTE — Patient Instructions (Signed)
Trial of Belsomra to help sleep  Continue CPAP 10, mask of choice, humidifier, supplies, AirView/ card  Please call if we can help

## 2020-01-30 DIAGNOSIS — I10 Essential (primary) hypertension: Secondary | ICD-10-CM | POA: Diagnosis not present

## 2020-02-13 DIAGNOSIS — G4733 Obstructive sleep apnea (adult) (pediatric): Secondary | ICD-10-CM | POA: Diagnosis not present

## 2020-02-13 DIAGNOSIS — G4730 Sleep apnea, unspecified: Secondary | ICD-10-CM | POA: Diagnosis not present

## 2020-02-29 DIAGNOSIS — M170 Bilateral primary osteoarthritis of knee: Secondary | ICD-10-CM | POA: Diagnosis not present

## 2020-02-29 DIAGNOSIS — M25562 Pain in left knee: Secondary | ICD-10-CM | POA: Diagnosis not present

## 2020-02-29 DIAGNOSIS — M1712 Unilateral primary osteoarthritis, left knee: Secondary | ICD-10-CM | POA: Diagnosis not present

## 2020-02-29 DIAGNOSIS — M25561 Pain in right knee: Secondary | ICD-10-CM | POA: Diagnosis not present

## 2020-02-29 DIAGNOSIS — M1711 Unilateral primary osteoarthritis, right knee: Secondary | ICD-10-CM | POA: Diagnosis not present

## 2020-03-15 DIAGNOSIS — G4730 Sleep apnea, unspecified: Secondary | ICD-10-CM | POA: Diagnosis not present

## 2020-03-15 DIAGNOSIS — G4733 Obstructive sleep apnea (adult) (pediatric): Secondary | ICD-10-CM | POA: Diagnosis not present

## 2020-04-02 ENCOUNTER — Other Ambulatory Visit: Payer: Self-pay | Admitting: Internal Medicine

## 2020-04-04 DIAGNOSIS — I10 Essential (primary) hypertension: Secondary | ICD-10-CM | POA: Diagnosis not present

## 2020-04-04 DIAGNOSIS — E7800 Pure hypercholesterolemia, unspecified: Secondary | ICD-10-CM | POA: Diagnosis not present

## 2020-04-04 DIAGNOSIS — M179 Osteoarthritis of knee, unspecified: Secondary | ICD-10-CM | POA: Diagnosis not present

## 2020-04-04 DIAGNOSIS — N400 Enlarged prostate without lower urinary tract symptoms: Secondary | ICD-10-CM | POA: Diagnosis not present

## 2020-04-04 DIAGNOSIS — M169 Osteoarthritis of hip, unspecified: Secondary | ICD-10-CM | POA: Diagnosis not present

## 2020-04-04 DIAGNOSIS — I2510 Atherosclerotic heart disease of native coronary artery without angina pectoris: Secondary | ICD-10-CM | POA: Diagnosis not present

## 2020-04-05 ENCOUNTER — Other Ambulatory Visit: Payer: Self-pay | Admitting: Internal Medicine

## 2020-04-09 ENCOUNTER — Telehealth: Payer: Self-pay | Admitting: Internal Medicine

## 2020-04-09 MED ORDER — TRIAZOLAM 0.25 MG PO TABS: 0.2500 mg | ORAL_TABLET | Freq: Every evening | ORAL | 5 refills | Status: DC | PRN

## 2020-04-09 NOTE — Telephone Encounter (Signed)
Called and spoke with Patient. Patient made aware of Halcion prescription being sent to pharmacy. Nothing further at this time.

## 2020-04-09 NOTE — Telephone Encounter (Signed)
Called and spoke with Patient. Patient stated Dr Annamaria Boots had sent a prescription for Belsomra to pharmacy to help with sleep. Patient stated the cost for Belsomra #10 was $149.  Patient stated he could not afford a month supply. Patient request to go back on Halcion.  Patient requested Hal[facility].   Message routed to Dr. Annamaria Boots  Allergies  Allergen Reactions  . Acetaminophen     Other reaction(s): mouth sores  . Amlodipine Swelling    Swelling of feet and legs  . Bactrim [Sulfamethoxazole-Trimethoprim] Other (See Comments)    Constipation   . Codeine Nausea Only  . Fexofenadine     Other reaction(s): mouth sores  . Ibuprofen     Other reaction(s): nervousness  . Lovenox [Enoxaparin Sodium] Other (See Comments)    MAY HAVE CAUSED THROMBOCYTOPENIA  . Naproxen     Other reaction(s): mouth sores  . Prednisone Swelling    Patient has tolerated doses for colds. Thinks it caused swelling in the past  . Shellfish Allergy Nausea And Vomiting and Other (See Comments)    N&V, shaking  . Sonata [Zaleplon] Other (See Comments)    Hyper feelings   Current Outpatient Medications on File Prior to Visit  Medication Sig Dispense Refill  . aspirin 81 MG tablet Take 81 mg by mouth every evening.     . finasteride (PROSCAR) 5 MG tablet Take 5 mg by mouth daily before breakfast.     . fish oil-omega-3 fatty acids 1000 MG capsule Take 1 g by mouth every other day.     . furosemide (LASIX) 20 MG tablet TAKE 1 TABLET BY MOUTH ONCE EACH MORNING AS NEEDED FOR SWELLING/EDEMA    . hydrochlorothiazide (HYDRODIURIL) 12.5 MG tablet Take 12.5 mg by mouth every morning.    . Influenza Vac High-Dose Quad (FLUZONE HIGH-DOSE QUADRIVALENT) 0.7 ML SUSY Fluzone High-Dose Quad 2020-21 (PF) 240 mcg/0.7 mL IM syringe  PHARMACY ADMINISTERED    . lovastatin (MEVACOR) 20 MG tablet Take 20 mg by mouth at bedtime.     . metoprolol (LOPRESSOR) 50 MG tablet Take 25 mg by mouth 2 (two)  times daily.    . potassium chloride (K-DUR,KLOR-CON) 10 MEQ tablet Take 10 mEq by mouth every evening.     . potassium chloride (KLOR-CON) 10 MEQ tablet potassium chloride ER 10 mEq tablet,extended release  TK 1 T PO QD    . Suvorexant (BELSOMRA) 15 MG TABS Take 1 tablet by mouth at bedtime as needed. 15 tablet 1  . Tamsulosin HCl (FLOMAX) 0.4 MG CAPS Take 0.4 mg by mouth daily after supper.    . telmisartan (MICARDIS) 40 MG tablet Take 40 mg by mouth daily.    . traZODone (DESYREL) 50 MG tablet Take 1 tablet (50 mg total) by mouth at bedtime. 30 tablet prn  . vitamin E 400 UNIT capsule Take 400 Units by mouth daily.      No current facility-administered medications on file prior to visit.

## 2020-04-09 NOTE — Telephone Encounter (Signed)
Halcion script ee-sent to [facility]

## 2020-04-14 DIAGNOSIS — G4730 Sleep apnea, unspecified: Secondary | ICD-10-CM | POA: Diagnosis not present

## 2020-04-14 DIAGNOSIS — G4733 Obstructive sleep apnea (adult) (pediatric): Secondary | ICD-10-CM | POA: Diagnosis not present

## 2020-04-15 DIAGNOSIS — W19XXXA Unspecified fall, initial encounter: Secondary | ICD-10-CM | POA: Diagnosis not present

## 2020-04-15 DIAGNOSIS — R0789 Other chest pain: Secondary | ICD-10-CM | POA: Diagnosis not present

## 2020-04-15 DIAGNOSIS — D692 Other nonthrombocytopenic purpura: Secondary | ICD-10-CM | POA: Diagnosis not present

## 2020-04-15 DIAGNOSIS — S61412A Laceration without foreign body of left hand, initial encounter: Secondary | ICD-10-CM | POA: Diagnosis not present

## 2020-04-15 DIAGNOSIS — S01112A Laceration without foreign body of left eyelid and periocular area, initial encounter: Secondary | ICD-10-CM | POA: Diagnosis not present

## 2020-04-15 DIAGNOSIS — S51012A Laceration without foreign body of left elbow, initial encounter: Secondary | ICD-10-CM | POA: Diagnosis not present

## 2020-05-02 DIAGNOSIS — I2510 Atherosclerotic heart disease of native coronary artery without angina pectoris: Secondary | ICD-10-CM | POA: Diagnosis not present

## 2020-05-02 DIAGNOSIS — N400 Enlarged prostate without lower urinary tract symptoms: Secondary | ICD-10-CM | POA: Diagnosis not present

## 2020-05-02 DIAGNOSIS — M169 Osteoarthritis of hip, unspecified: Secondary | ICD-10-CM | POA: Diagnosis not present

## 2020-05-02 DIAGNOSIS — M179 Osteoarthritis of knee, unspecified: Secondary | ICD-10-CM | POA: Diagnosis not present

## 2020-05-02 DIAGNOSIS — I10 Essential (primary) hypertension: Secondary | ICD-10-CM | POA: Diagnosis not present

## 2020-05-02 DIAGNOSIS — E7800 Pure hypercholesterolemia, unspecified: Secondary | ICD-10-CM | POA: Diagnosis not present

## 2020-05-09 DIAGNOSIS — D225 Melanocytic nevi of trunk: Secondary | ICD-10-CM | POA: Diagnosis not present

## 2020-05-09 DIAGNOSIS — L570 Actinic keratosis: Secondary | ICD-10-CM | POA: Diagnosis not present

## 2020-05-09 DIAGNOSIS — Z85820 Personal history of malignant melanoma of skin: Secondary | ICD-10-CM | POA: Diagnosis not present

## 2020-05-09 DIAGNOSIS — Z1283 Encounter for screening for malignant neoplasm of skin: Secondary | ICD-10-CM | POA: Diagnosis not present

## 2020-05-09 DIAGNOSIS — C44319 Basal cell carcinoma of skin of other parts of face: Secondary | ICD-10-CM | POA: Diagnosis not present

## 2020-05-09 DIAGNOSIS — Z08 Encounter for follow-up examination after completed treatment for malignant neoplasm: Secondary | ICD-10-CM | POA: Diagnosis not present

## 2020-05-09 DIAGNOSIS — X32XXXD Exposure to sunlight, subsequent encounter: Secondary | ICD-10-CM | POA: Diagnosis not present

## 2020-05-12 ENCOUNTER — Ambulatory Visit: Payer: Medicare HMO | Admitting: Internal Medicine

## 2020-05-15 NOTE — Progress Notes (Signed)
CARDIOLOGY OFFICE NOTE  Date:  05/16/2020    Scott [name] Date of Birth: 02-20-1928 Medical Record #[id number]  PCP:  Scott Funk, MD  Cardiologist:  Scott Upshaw [place] MD  Chief Complaint  Patient presents with  . Coronary Artery Disease  . Hypertension    History of Present Illness: Scott [name] is a 86 y.o. male who presents today for follow up CAD and edema.  He has known CAD with remote CABG back in 2007 by  Dr. Tyrone [name]. Negative stress Myoview in 2010. Other issues include  HTN, HLD and OSA. Last echo in 2013.    He was seen last year with complaint that since his maxzide(due to pancreatitis) was stopped he has noted increased swelling in his legs and feet.  He was started on telmisartan and lasix 20 mg daily with no improvement.  He has a rash on his feet as well.   We performed evaluation with lab work including Chemistries, TSH, CBC,UA,  and BNP which were all normal except chronically low platelets. LE dopplers were negative for DVT. Echo was fairly unremarkable with gr 2 diastolic dysfunction and mild pulmonary HTN. EF was normal. He has been using lasix prn with improvement in edema and denies current SOB.   Since his last visit he was started on HCTZ 12.5 mg daily by primary care for BP. He does note increase in wheezing recently. This is keeping him up at night. Has edema and is only taking lasix prn. States he has to go too frequently if he takes daily. Some chest congestion.  Past Medical History:  Diagnosis Date  . Anemia    Acute blood loss anemia secondary to surgery, followed fr/ Hip replacement   . Anxiety    pt. reports that he has occas. nervous episodes   . BPH (benign prostatic hyperplasia)   . CAD (coronary artery disease)    s/p CABG in 2007  CARDILOLOGIST IS DR. P. [name]  . Cancer (HCC)    melanoma - removed L ear - 2010  . Cranial neuropathy    resolved   . Generalized OA   . GERD (gastroesophageal reflux disease)   . H/O  echocardiogram    last echo, stress test 05/2012- pt. followed by [facility]   . Hypercholesterolemia   . Hypertension   . Obesity   . Osteoarthritis    End-stage osteoarthritis, right hip  . Sleep apnea    with use of CPAP  . SOB (shortness of breath)    WITH EXERTION  . Thrombocytopenia (HCC)    possibly due to Lovenox    Past Surgical History:  Procedure Laterality Date  . ACHILLES TENDON SURGERY  10/06/2012   Procedure: ACHILLES TENDON REPAIR;  Surgeon: Thera Flake., MD;  Location: [facility] OR;  Service: Orthopedics;  Laterality: Right;  REPAIR RIGHT RUPTURE ACHILLES TENDON PRIMARY OPEN/PERCUTANEOUS, EXCISION PARTIAL BONE TALUS/CANCANEUS, REPAIR PARTIAL EXCISION CALCANEUS  . APPENDECTOMY    . ARTERY BIOPSY     temporal - L side- wnl, double vision treated /w prednisone - 1991  . CALCANEAL OSTEOTOMY  10/06/2012   Procedure: CALCANEAL OSTEOTOMY;  Surgeon: Thera Flake., MD;  Location: [facility] OR;  Service: Orthopedics;  Laterality: Right;  . CARDIAC CATHETERIZATION  12/15/2005   NORMAL. EF 60%, angioplasty 1995  . carpel tunnel surgery-bilateral     both hands   . CORONARY ARTERY BYPASS GRAFT  2007   LIMA GRAFT TO THE LAD, SAPHENOUS VEIN GRAFT TO THE FIRST OBTUSE  MARIGNAL, SAPHENOUS VEIN GRAFT TO THE SECOND OBTUSE MARGINAL AND A SAPHENOUS VEIN GRAFT TO THE ACUTE MARGINAL AND POSTERIOR LATERAL BRANCH  . HERNIA REPAIR    . I & D EXTREMITY  12/11/2012   Procedure: IRRIGATION AND DEBRIDEMENT EXTREMITY;  Surgeon: Nadara Mustard, MD;  Location: [facility] OR;  Service: Orthopedics;  Laterality: Right;  Irrigation and Debridement achilles, wound closure, apply wound vac, place antibiotic beads  . INGUINAL HERNIA REPAIR Left 05/10/2013   Procedure: HERNIA REPAIR INGUINAL ADULT;  Surgeon: Velora Heckler, MD;  Location: [facility] ORS;  Service: General;  Laterality: Left;  . INSERTION OF MESH Left 05/10/2013   Procedure: INSERTION OF MESH;  Surgeon: Velora Heckler, MD;  Location: [facility] ORS;  Service: General;  Laterality: Left;   . JOINT REPLACEMENT    . knee  11/12   left arthroscopic  . TOTAL HIP ARTHROPLASTY     right     Medications: Current Outpatient Medications  Medication Sig Dispense Refill  . aspirin 81 MG tablet Take 81 mg by mouth every evening.     . finasteride (PROSCAR) 5 MG tablet Take 5 mg by mouth daily before breakfast.     . fish oil-omega-3 fatty acids 1000 MG capsule Take 1 g by mouth every other day.     . furosemide (LASIX) 20 MG tablet TAKE 1 TABLET BY MOUTH ONCE EACH MORNING AS NEEDED FOR SWELLING/EDEMA    . hydrochlorothiazide (HYDRODIURIL) 12.5 MG tablet Take 12.5 mg by mouth every morning.    . Influenza Vac High-Dose Quad (FLUZONE HIGH-DOSE QUADRIVALENT) 0.7 ML SUSY Fluzone High-Dose Quad 2020-21 (PF) 240 mcg/0.7 mL IM syringe  PHARMACY ADMINISTERED    . lovastatin (MEVACOR) 20 MG tablet Take 20 mg by mouth at bedtime.     . metoprolol (LOPRESSOR) 50 MG tablet Take 25 mg by mouth 2 (two) times daily.    . potassium chloride (K-DUR,KLOR-CON) 10 MEQ tablet Take 10 mEq by mouth every evening.     . potassium chloride (KLOR-CON) 10 MEQ tablet potassium chloride ER 10 mEq tablet,extended release  TK 1 T PO QD    . Tamsulosin HCl (FLOMAX) 0.4 MG CAPS Take 0.4 mg by mouth daily after supper.    . telmisartan (MICARDIS) 40 MG tablet Take 40 mg by mouth daily.    . traZODone (DESYREL) 50 MG tablet Take 1 tablet (50 mg total) by mouth at bedtime. 30 tablet prn  . triazolam (HALCION) 0.25 MG tablet Take 1 tablet (0.25 mg total) by mouth at bedtime as needed for sleep. 30 tablet 5  . vitamin E 400 UNIT capsule Take 400 Units by mouth daily.      No current facility-administered medications for this visit.    Allergies: Allergies  Allergen Reactions  . Acetaminophen     Other reaction(s): mouth sores  . Amlodipine Swelling    Swelling of feet and legs  . Bactrim [Sulfamethoxazole-Trimethoprim] Other (See Comments)    Constipation   . Codeine Nausea Only  . Fexofenadine     Other  reaction(s): mouth sores  . Ibuprofen     Other reaction(s): nervousness  . Lovenox [Enoxaparin Sodium] Other (See Comments)    MAY HAVE CAUSED THROMBOCYTOPENIA  . Naproxen     Other reaction(s): mouth sores  . Prednisone Swelling    Patient has tolerated doses for colds. Thinks it caused swelling in the past  . Shellfish Allergy Nausea And Vomiting and Other (See Comments)    N&V, shaking  .  Sonata [Zaleplon] Other (See Comments)    Hyper feelings    Social History: The patient  reports that he has never smoked. He has never used smokeless tobacco. He reports that he does not drink alcohol and does not use drugs.   Family History: The patient's family history includes Heart disease in his brother and brother; Prostate cancer in his brother.   Review of Systems: Please see the history of present illness.     All other systems are reviewed and negative.   Physical Exam: VS:  BP 138/68   Pulse 68   Ht 5\' 6"  (1.676 m)   Wt 186 lb 12.8 oz (84.7 kg)   SpO2 97%   BMI 30.15 kg/m  .  BMI Body mass index is 30.15 kg/m.  Wt Readings from Last 3 Encounters:  05/16/20 186 lb 12.8 oz (84.7 kg)  01/10/20 188 lb 12.8 oz (85.6 kg)  10/09/19 183 lb (83 kg)   GENERAL:  Elderly  WM in NAD HEENT:  PERRL, EOMI, sclera are clear. Oropharynx is clear. NECK:  No jugular venous distention, carotid upstroke brisk and symmetric, no bruits, no thyromegaly or adenopathy LUNGS:  Bilateral expiratory wheezing.  CHEST:  Unremarkable HEART:  RRR,  PMI not displaced or sustained,S1 and S2 within normal limits, no S3, no S4: no clicks, no rubs, no murmurs ABD:  Soft, nontender. BS +, no masses or bruits. No hepatomegaly, no splenomegaly EXT:  2 + pulses throughout, 1-2+  pretibial  Edema with stasis dermatitis.  no cyanosis no clubbing SKIN:  Warm and dry.  No rashes NEURO:  Alert and oriented x 3. Cranial nerves II through XII intact. PSYCH:  Cognitively intact   LABORATORY DATA:  EKG:  EKG is   ordered today. NSR with first degree AV block. Rate 68. Nonspecific ST abnormality. I have personally reviewed and interpreted this study.    Lab Results  Component Value Date   WBC 6.8 09/05/2019   HGB 13.6 09/05/2019   HCT 41.0 09/05/2019   PLT 122 (L) 09/05/2019   GLUCOSE 101 (H) 09/05/2019   CHOL 152 05/17/2011   TRIG 56 04/17/2019   HDL 40.30 05/17/2011   LDLCALC 82 05/17/2011   ALT 11 09/05/2019   AST 17 09/05/2019   NA 141 09/05/2019   K 4.1 09/05/2019   CL 100 09/05/2019   CREATININE 0.91 09/05/2019   BUN 10 09/05/2019   CO2 27 09/05/2019   TSH 1.750 09/05/2019   INR 0.97 12/11/2012    BNP (last 3 results) Recent Labs    09/05/19 1054  BNP 93.2    ProBNP (last 3 results) No results for input(s): PROBNP in the last 8760 hours.   Other Studies Reviewed Today:  Labs from primary care: 01/07/16: cholesterol 141, triglycerides 144, LDL 65, HDL 47.  09/10/16: BMET normal. Dated 11/16/17: cholesterol 161, triglycerides 134, HDL 51, LDL 84. BMET normal Dated 11/21/18: cholesterol 160, triglycerides 182, HDL 54, LDL 70. Potassium 3.3. creatinine normal. Dated 12/13/19: cholesterol 142, triglycerides 159, HDL 49, LDL 66. Dated 01/16/20: normal CBC and CMET  Echo 09/10/19: IMPRESSIONS    1. Left ventricular ejection fraction, by visual estimation, is 60 to 65%. The left ventricle has normal function. Normal left ventricular size. There is mildly increased left ventricular hypertrophy.  2. Left ventricular diastolic Doppler parameters are consistent with impaired relaxation pattern of LV diastolic filling.  3. Global right ventricle has normal systolic function.The right ventricular size is mildly enlarged. No increase in right  ventricular wall thickness.  4. Left atrial size was normal.  5. Right atrial size was normal.  6. The mitral valve is normal in structure. Mild mitral valve regurgitation. No evidence of mitral stenosis.  7. The tricuspid valve is normal in  structure. Tricuspid valve regurgitation is trivial.  8. The aortic valve is normal in structure. Aortic valve regurgitation was not visualized by color flow Doppler. Structurally normal aortic valve, with no evidence of sclerosis or stenosis.  9. The pulmonic valve was normal in structure. Pulmonic valve regurgitation is trivial by color flow Doppler. 10. There is mild dilatation of the aortic root measuring 42 mm. 11. Mildly elevated pulmonary artery systolic pressure. 12. The tricuspid regurgitant velocity is 2.71 m/s, and with an assumed right atrial pressure of 8 mmHg, the estimated right ventricular systolic pressure is mildly elevated at 37.4 mmHg. 13. The inferior vena cava is normal in size with greater than 50% respiratory variability, suggesting right atrial pressure of 3 mmHg.  In comparison to the previous echocardiogram(s): 05/23/12 EF 55-65%. PA pressure .  Assessment/Plan: 1.  CAD with prior CABG back in 2007 - he remains asymptomatic. Continue medical therapy with ASA, beta blocker and statin.  2. LE edema and SOB. Extensive evaluation as noted above. Responded well to lasix before. I told him he can take lasix daily.   Stressed importance of sodium restriction. Instructed to wear compression hose.   3. HLD - on statin therapy.   4. HTN controlled.   5. Bronchospastic lung disease with increased wheezing. Will give Medrol dose pack.   Disposition:   Follow up  in 6 months  Signed: Tilia Faso [place] MD, [facility]  05/16/2020 3:56 PM  [facility]

## 2020-05-16 ENCOUNTER — Other Ambulatory Visit: Payer: Self-pay

## 2020-05-16 ENCOUNTER — Ambulatory Visit: Payer: Medicare HMO | Admitting: Cardiology

## 2020-05-16 ENCOUNTER — Encounter: Payer: Self-pay | Admitting: Cardiology

## 2020-05-16 VITALS — BP 138/68 | HR 68 | Ht 66.0 in | Wt 186.8 lb

## 2020-05-16 DIAGNOSIS — I25810 Atherosclerosis of coronary artery bypass graft(s) without angina pectoris: Secondary | ICD-10-CM

## 2020-05-16 DIAGNOSIS — I10 Essential (primary) hypertension: Secondary | ICD-10-CM | POA: Diagnosis not present

## 2020-05-16 DIAGNOSIS — J9801 Acute bronchospasm: Secondary | ICD-10-CM | POA: Diagnosis not present

## 2020-05-16 MED ORDER — METHYLPREDNISOLONE 4 MG PO TBPK: ORAL_TABLET | ORAL | 0 refills | Status: DC

## 2020-05-16 NOTE — Patient Instructions (Addendum)
You may take lasix daily for swelling.  We will send in a Medrol dose pack for your wheezing.  Your physician recommends that you schedule a follow-up appointment in: 6 months

## 2020-06-13 DIAGNOSIS — C44319 Basal cell carcinoma of skin of other parts of face: Secondary | ICD-10-CM | POA: Diagnosis not present

## 2020-06-13 DIAGNOSIS — L820 Inflamed seborrheic keratosis: Secondary | ICD-10-CM | POA: Diagnosis not present

## 2020-06-20 DIAGNOSIS — M170 Bilateral primary osteoarthritis of knee: Secondary | ICD-10-CM | POA: Diagnosis not present

## 2020-06-20 DIAGNOSIS — M1711 Unilateral primary osteoarthritis, right knee: Secondary | ICD-10-CM | POA: Diagnosis not present

## 2020-06-20 DIAGNOSIS — M25561 Pain in right knee: Secondary | ICD-10-CM | POA: Diagnosis not present

## 2020-06-20 DIAGNOSIS — M25562 Pain in left knee: Secondary | ICD-10-CM | POA: Diagnosis not present

## 2020-06-20 DIAGNOSIS — M1712 Unilateral primary osteoarthritis, left knee: Secondary | ICD-10-CM | POA: Diagnosis not present

## 2020-08-05 DIAGNOSIS — I10 Essential (primary) hypertension: Secondary | ICD-10-CM | POA: Diagnosis not present

## 2020-08-12 DIAGNOSIS — M169 Osteoarthritis of hip, unspecified: Secondary | ICD-10-CM | POA: Diagnosis not present

## 2020-08-12 DIAGNOSIS — E7800 Pure hypercholesterolemia, unspecified: Secondary | ICD-10-CM | POA: Diagnosis not present

## 2020-08-12 DIAGNOSIS — N400 Enlarged prostate without lower urinary tract symptoms: Secondary | ICD-10-CM | POA: Diagnosis not present

## 2020-08-12 DIAGNOSIS — M179 Osteoarthritis of knee, unspecified: Secondary | ICD-10-CM | POA: Diagnosis not present

## 2020-08-12 DIAGNOSIS — I2510 Atherosclerotic heart disease of native coronary artery without angina pectoris: Secondary | ICD-10-CM | POA: Diagnosis not present

## 2020-08-12 DIAGNOSIS — I10 Essential (primary) hypertension: Secondary | ICD-10-CM | POA: Diagnosis not present

## 2020-08-14 ENCOUNTER — Telehealth: Payer: Self-pay | Admitting: Cardiology

## 2020-08-14 NOTE — Telephone Encounter (Signed)
I would have him take lasix 40 mg bid for 3 days. Continue sodium restriction  Zamyiah Tino Martinique MD, [facility]

## 2020-08-14 NOTE — Telephone Encounter (Signed)
Called patient no answer.LMTC. 

## 2020-08-14 NOTE — Telephone Encounter (Signed)
Pt c/o swelling: STAT is pt has developed SOB within 24 hours  1) How much weight have you gained and in what time span? 8 lbs  2) If swelling, where is the swelling located?  Both Legs  3) Are you currently taking a fluid pill? Yes   4) Are you currently SOB? No  5) Do you have a log of your daily weights (if so, list)? Yes  6) Have you gained 3 pounds in a day or 5 pounds in a week? Yes  7) Have you traveled recently? No

## 2020-08-14 NOTE — Telephone Encounter (Signed)
Patient called into office regarding swelling in his legs. Patient states the swelling in his left leg is much worse, and his right leg has started to swell as well. Patient states he has taken his lasix as prescribed and even tried taking 40mg  of lasix several days ago which he stated did not make a difference. Patient reports wearing his compression stockings as indicated as well. Patient is unsure of his weight gain this week but stated he has gained about 8 pounds since seeing Dr. Martinique last which per chart review looked to be back in June of this year. Patient states he has no pain in his legs just the swelling, and patient states he is slightly short of breath but states he has been running back and forth to the bathroom. Patient denies any other symptoms and states overall he feels fine, is just concerned with the swelling in both legs. Advised patient I would forward message to Dr. Martinique for review and advice.

## 2020-08-16 DIAGNOSIS — R69 Illness, unspecified: Secondary | ICD-10-CM | POA: Diagnosis not present

## 2020-08-18 NOTE — Telephone Encounter (Signed)
Received a call from patient Dr.Jordan's advice given.Advised to call back if swelling continues.

## 2020-09-04 DIAGNOSIS — I10 Essential (primary) hypertension: Secondary | ICD-10-CM | POA: Diagnosis not present

## 2020-09-12 DIAGNOSIS — M25561 Pain in right knee: Secondary | ICD-10-CM | POA: Diagnosis not present

## 2020-09-12 DIAGNOSIS — M25562 Pain in left knee: Secondary | ICD-10-CM | POA: Diagnosis not present

## 2020-09-12 DIAGNOSIS — M1711 Unilateral primary osteoarthritis, right knee: Secondary | ICD-10-CM | POA: Diagnosis not present

## 2020-09-12 DIAGNOSIS — M1712 Unilateral primary osteoarthritis, left knee: Secondary | ICD-10-CM | POA: Diagnosis not present

## 2020-09-12 DIAGNOSIS — M170 Bilateral primary osteoarthritis of knee: Secondary | ICD-10-CM | POA: Diagnosis not present

## 2020-10-03 DIAGNOSIS — I10 Essential (primary) hypertension: Secondary | ICD-10-CM | POA: Diagnosis not present

## 2020-11-02 ENCOUNTER — Other Ambulatory Visit: Payer: Self-pay | Admitting: Internal Medicine

## 2020-11-05 NOTE — Telephone Encounter (Signed)
Halcion sleep med refilled

## 2020-11-08 NOTE — Progress Notes (Signed)
CARDIOLOGY OFFICE NOTE  Date:  11/13/2020    Scott [name] Date of Birth: Aug 26, 1928 Medical Record #[id number]  PCP:  Kirby Funk, MD  Cardiologist:  Khylie Larmore [place] MD  Chief Complaint  Patient presents with  . Coronary Artery Disease    History of Present Illness: Scott [name] is a 86 y.o. male who presents today for follow up CAD and edema.  He has known CAD with remote CABG back in 2007 by  Dr. Tyrone Sage. Negative stress Myoview in 2010. Other issues include  HTN, HLD and OSA. Last echo in 2013.    Last year we performed evaluation with lab work including Chemistries, TSH, CBC,UA,  and BNP which were all normal except chronically low platelets. LE dopplers were negative for DVT. Echo was fairly unremarkable with gr 2 diastolic dysfunction and mild pulmonary HTN. EF was normal. He has been using lasix prn with improvement in edema and denies current SOB.   On follow up today he notes he has had a "cold" since Sunday with nonproductive cough and runny nose. No fever or chills. No chest pain or myalgias. No known exposure to Covid. States today he has felt the best but still coughing at night.   Past Medical History:  Diagnosis Date  . Anemia    Acute blood loss anemia secondary to surgery, followed fr/ Hip replacement   . Anxiety    pt. reports that he has occas. nervous episodes   . BPH (benign prostatic hyperplasia)   . CAD (coronary artery disease)    s/p CABG in 2007  CARDILOLOGIST IS DR. P. [place]  . Cancer (HCC)    melanoma - removed L ear - 2010  . Cranial neuropathy    resolved   . Generalized OA   . GERD (gastroesophageal reflux disease)   . H/O echocardiogram    last echo, stress test 05/2012- pt. followed by [facility]   . Hypercholesterolemia   . Hypertension   . Obesity   . Osteoarthritis    End-stage osteoarthritis, right hip  . Sleep apnea    with use of CPAP  . SOB (shortness of breath)    WITH EXERTION  . Thrombocytopenia (HCC)    possibly  due to Lovenox    Past Surgical History:  Procedure Laterality Date  . ACHILLES TENDON SURGERY  10/06/2012   Procedure: ACHILLES TENDON REPAIR;  Surgeon: Thera Flake., MD;  Location: [facility] OR;  Service: Orthopedics;  Laterality: Right;  REPAIR RIGHT RUPTURE ACHILLES TENDON PRIMARY OPEN/PERCUTANEOUS, EXCISION PARTIAL BONE TALUS/CANCANEUS, REPAIR PARTIAL EXCISION CALCANEUS  . APPENDECTOMY    . ARTERY BIOPSY     temporal - L side- wnl, double vision treated /w prednisone - 1991  . CALCANEAL OSTEOTOMY  10/06/2012   Procedure: CALCANEAL OSTEOTOMY;  Surgeon: Thera Flake., MD;  Location: [facility] OR;  Service: Orthopedics;  Laterality: Right;  . CARDIAC CATHETERIZATION  12/15/2005   NORMAL. EF 60%, angioplasty 1995  . carpel tunnel surgery-bilateral     both hands   . CORONARY ARTERY BYPASS GRAFT  2007   LIMA GRAFT TO THE LAD, SAPHENOUS VEIN GRAFT TO THE FIRST OBTUSE MARIGNAL, SAPHENOUS VEIN GRAFT TO THE SECOND OBTUSE MARGINAL AND A SAPHENOUS VEIN GRAFT TO THE ACUTE MARGINAL AND POSTERIOR LATERAL BRANCH  . HERNIA REPAIR    . I & D EXTREMITY  12/11/2012   Procedure: IRRIGATION AND DEBRIDEMENT EXTREMITY;  Surgeon: Nadara Mustard, MD;  Location: [facility] OR;  Service: Orthopedics;  Laterality: Right;  Irrigation and Debridement achilles, wound closure, apply wound vac, place antibiotic beads  . INGUINAL HERNIA REPAIR Left 05/10/2013   Procedure: HERNIA REPAIR INGUINAL ADULT;  Surgeon: Velora Heckler, MD;  Location: [facility] ORS;  Service: General;  Laterality: Left;  . INSERTION OF MESH Left 05/10/2013   Procedure: INSERTION OF MESH;  Surgeon: Velora Heckler, MD;  Location: [facility] ORS;  Service: General;  Laterality: Left;  . JOINT REPLACEMENT    . knee  11/12   left arthroscopic  . TOTAL HIP ARTHROPLASTY     right     Medications: Current Outpatient Medications  Medication Sig Dispense Refill  . aspirin 81 MG tablet Take 81 mg by mouth every evening.    . finasteride (PROSCAR) 5 MG tablet Take 5 mg by mouth daily  before breakfast.    . fish oil-omega-3 fatty acids 1000 MG capsule Take 1 g by mouth every other day.    . furosemide (LASIX) 20 MG tablet TAKE 1 TABLET BY MOUTH ONCE EACH MORNING AS NEEDED FOR SWELLING/EDEMA    . hydrochlorothiazide (HYDRODIURIL) 12.5 MG tablet Take 12.5 mg by mouth every morning.    . Influenza Vac High-Dose Quad (FLUZONE HIGH-DOSE QUADRIVALENT) 0.7 ML SUSY Fluzone High-Dose Quad 2020-21 (PF) 240 mcg/0.7 mL IM syringe  PHARMACY ADMINISTERED    . lovastatin (MEVACOR) 20 MG tablet Take 20 mg by mouth at bedtime.    . methylPREDNISolone (MEDROL DOSEPAK) 4 MG TBPK tablet As directed 21 tablet 0  . metoprolol (LOPRESSOR) 50 MG tablet Take 25 mg by mouth 2 (two) times daily.    . potassium chloride (K-DUR,KLOR-CON) 10 MEQ tablet Take 10 mEq by mouth every evening.     . potassium chloride (KLOR-CON) 10 MEQ tablet potassium chloride ER 10 mEq tablet,extended release  TK 1 T PO QD    . Tamsulosin HCl (FLOMAX) 0.4 MG CAPS Take 0.4 mg by mouth daily after supper.    . telmisartan (MICARDIS) 40 MG tablet Take 40 mg by mouth daily.    . traZODone (DESYREL) 50 MG tablet Take 1 tablet (50 mg total) by mouth at bedtime. 30 tablet prn  . triazolam (HALCION) 0.25 MG tablet TAKE 1 TABLET (0.25 MG TOTAL) BY MOUTH AT BEDTIME AS NEEDED FOR SLEEP. 30 tablet 5  . vitamin E 400 UNIT capsule Take 400 Units by mouth daily.      No current facility-administered medications for this visit.    Allergies: Allergies  Allergen Reactions  . Acetaminophen     Other reaction(s): mouth sores  . Amlodipine Swelling    Swelling of feet and legs  . Bactrim [Sulfamethoxazole-Trimethoprim] Other (See Comments)    Constipation   . Codeine Nausea Only  . Fexofenadine     Other reaction(s): mouth sores  . Ibuprofen     Other reaction(s): nervousness  . Lovenox [Enoxaparin Sodium] Other (See Comments)    MAY HAVE CAUSED THROMBOCYTOPENIA  . Naproxen     Other reaction(s): mouth sores  . Prednisone  Swelling    Patient has tolerated doses for colds. Thinks it caused swelling in the past  . Shellfish Allergy Nausea And Vomiting and Other (See Comments)    N&V, shaking  . Sonata [Zaleplon] Other (See Comments)    Hyper feelings    Social History: The patient  reports that he has never smoked. He has never used smokeless tobacco. He reports that he does not drink alcohol and does not use drugs.   Family History: The patient's family  history includes Heart disease in his brother and brother; Prostate cancer in his brother.   Review of Systems: Please see the history of present illness.     All other systems are reviewed and negative.   Physical Exam: VS:  BP (!) 154/70   Pulse 64   Ht 5\' 5"  (1.651 m)   Wt 188 lb 12.8 oz (85.6 kg)   SpO2 95%   BMI 31.42 kg/m  .  BMI Body mass index is 31.42 kg/m.  Wt Readings from Last 3 Encounters:  11/13/20 188 lb 12.8 oz (85.6 kg)  05/16/20 186 lb 12.8 oz (84.7 kg)  01/10/20 188 lb 12.8 oz (85.6 kg)   GENERAL:  Elderly  WM in NAD HEENT:  PERRL, EOMI, sclera are clear. Oropharynx is clear. NECK:  No jugular venous distention, carotid upstroke brisk and symmetric, no bruits, no thyromegaly or adenopathy LUNGS:  Bilateral scattered wheezing.  CHEST:  Unremarkable HEART:  RRR,  PMI not displaced or sustained,S1 and S2 within normal limits, no S3, no S4: no clicks, no rubs, no murmurs ABD:  Soft, nontender. BS +, no masses or bruits. No hepatomegaly, no splenomegaly EXT:  2 + pulses throughout, 1-2+  pretibial  Edema with stasis dermatitis.  no cyanosis no clubbing SKIN:  Warm and dry.  No rashes NEURO:  Alert and oriented x 3. Cranial nerves II through XII intact. PSYCH:  Cognitively intact   LABORATORY DATA:  EKG:  EKG is  Not ordered today.     Lab Results  Component Value Date   WBC 6.8 09/05/2019   HGB 13.6 09/05/2019   HCT 41.0 09/05/2019   PLT 122 (L) 09/05/2019   GLUCOSE 101 (H) 09/05/2019   CHOL 152 05/17/2011   TRIG  56 04/17/2019   HDL 40.30 05/17/2011   LDLCALC 82 05/17/2011   ALT 11 09/05/2019   AST 17 09/05/2019   NA 141 09/05/2019   K 4.1 09/05/2019   CL 100 09/05/2019   CREATININE 0.91 09/05/2019   BUN 10 09/05/2019   CO2 27 09/05/2019   TSH 1.750 09/05/2019   INR 0.97 12/11/2012    BNP (last 3 results) No results for input(s): BNP in the last 8760 hours.  ProBNP (last 3 results) No results for input(s): PROBNP in the last 8760 hours.   Other Studies Reviewed Today:  Labs from primary care: 01/07/16: cholesterol 141, triglycerides 144, LDL 65, HDL 47.  09/10/16: BMET normal. Dated 11/16/17: cholesterol 161, triglycerides 134, HDL 51, LDL 84. BMET normal Dated 11/21/18: cholesterol 160, triglycerides 182, HDL 54, LDL 70. Potassium 3.3. creatinine normal. Dated 12/13/19: cholesterol 142, triglycerides 159, HDL 49, LDL 66. Dated 01/16/20: normal CBC and CMET  Echo 09/10/19: IMPRESSIONS    1. Left ventricular ejection fraction, by visual estimation, is 60 to 65%. The left ventricle has normal function. Normal left ventricular size. There is mildly increased left ventricular hypertrophy.  2. Left ventricular diastolic Doppler parameters are consistent with impaired relaxation pattern of LV diastolic filling.  3. Global right ventricle has normal systolic function.The right ventricular size is mildly enlarged. No increase in right ventricular wall thickness.  4. Left atrial size was normal.  5. Right atrial size was normal.  6. The mitral valve is normal in structure. Mild mitral valve regurgitation. No evidence of mitral stenosis.  7. The tricuspid valve is normal in structure. Tricuspid valve regurgitation is trivial.  8. The aortic valve is normal in structure. Aortic valve regurgitation was not visualized by color  flow Doppler. Structurally normal aortic valve, with no evidence of sclerosis or stenosis.  9. The pulmonic valve was normal in structure. Pulmonic valve regurgitation is  trivial by color flow Doppler. 10. There is mild dilatation of the aortic root measuring 42 mm. 11. Mildly elevated pulmonary artery systolic pressure. 12. The tricuspid regurgitant velocity is 2.71 m/s, and with an assumed right atrial pressure of 8 mmHg, the estimated right ventricular systolic pressure is mildly elevated at 37.4 mmHg. 13. The inferior vena cava is normal in size with greater than 50% respiratory variability, suggesting right atrial pressure of 3 mmHg.  In comparison to the previous echocardiogram(s): 05/23/12 EF 55-65%. PA pressure .  Assessment/Plan: 1.  CAD with prior CABG back in 2007 - he remains asymptomatic. Continue medical therapy with ASA, beta blocker and statin.  2. Viral URI. Symptoms appear to be improving. Recommend hydration and cough suppressant/expectorant. No indication for antibiotics. Recommend if symptoms persist that he get tested for Covid.   3. HLD - on statin therapy. Plan labs with Dr Valentina Lucks next month  4. HTN controlled.   5. Bronchospastic lung disease   Disposition:   Follow up  in one year  Signed: Martice Doty [place] MD, [facility]  11/13/2020 11:50 AM  [facility]

## 2020-11-13 ENCOUNTER — Ambulatory Visit: Payer: Medicare HMO | Admitting: Cardiology

## 2020-11-13 ENCOUNTER — Other Ambulatory Visit: Payer: Self-pay

## 2020-11-13 ENCOUNTER — Encounter: Payer: Self-pay | Admitting: Cardiology

## 2020-11-13 VITALS — BP 154/70 | HR 64 | Ht 65.0 in | Wt 188.8 lb

## 2020-11-13 DIAGNOSIS — E7800 Pure hypercholesterolemia, unspecified: Secondary | ICD-10-CM | POA: Diagnosis not present

## 2020-11-13 DIAGNOSIS — J9801 Acute bronchospasm: Secondary | ICD-10-CM | POA: Diagnosis not present

## 2020-11-13 DIAGNOSIS — I10 Essential (primary) hypertension: Secondary | ICD-10-CM | POA: Diagnosis not present

## 2020-11-13 DIAGNOSIS — I25810 Atherosclerosis of coronary artery bypass graft(s) without angina pectoris: Secondary | ICD-10-CM | POA: Diagnosis not present

## 2020-11-27 ENCOUNTER — Emergency Department (HOSPITAL_COMMUNITY)
Admission: EM | Admit: 2020-11-27 | Discharge: 2020-11-28 | Discharge status: Against Medical Advice | Payer: Medicare HMO | Attending: Emergency Medicine | Admitting: Emergency Medicine

## 2020-11-27 ENCOUNTER — Emergency Department (HOSPITAL_COMMUNITY): Payer: Medicare HMO

## 2020-11-27 ENCOUNTER — Other Ambulatory Visit: Payer: Self-pay

## 2020-11-27 DIAGNOSIS — R609 Edema, unspecified: Secondary | ICD-10-CM | POA: Insufficient documentation

## 2020-11-27 DIAGNOSIS — Z20822 Contact with and (suspected) exposure to covid-19: Secondary | ICD-10-CM | POA: Insufficient documentation

## 2020-11-27 DIAGNOSIS — Z85828 Personal history of other malignant neoplasm of skin: Secondary | ICD-10-CM | POA: Insufficient documentation

## 2020-11-27 DIAGNOSIS — Z79899 Other long term (current) drug therapy: Secondary | ICD-10-CM | POA: Insufficient documentation

## 2020-11-27 DIAGNOSIS — Z96641 Presence of right artificial hip joint: Secondary | ICD-10-CM | POA: Insufficient documentation

## 2020-11-27 DIAGNOSIS — I2510 Atherosclerotic heart disease of native coronary artery without angina pectoris: Secondary | ICD-10-CM | POA: Insufficient documentation

## 2020-11-27 DIAGNOSIS — I10 Essential (primary) hypertension: Secondary | ICD-10-CM | POA: Diagnosis not present

## 2020-11-27 DIAGNOSIS — R0603 Acute respiratory distress: Secondary | ICD-10-CM | POA: Diagnosis not present

## 2020-11-27 DIAGNOSIS — Z951 Presence of aortocoronary bypass graft: Secondary | ICD-10-CM | POA: Insufficient documentation

## 2020-11-27 DIAGNOSIS — R0602 Shortness of breath: Secondary | ICD-10-CM | POA: Diagnosis not present

## 2020-11-27 DIAGNOSIS — Z7982 Long term (current) use of aspirin: Secondary | ICD-10-CM | POA: Diagnosis not present

## 2020-11-27 DIAGNOSIS — R0689 Other abnormalities of breathing: Secondary | ICD-10-CM | POA: Diagnosis not present

## 2020-11-27 DIAGNOSIS — R062 Wheezing: Secondary | ICD-10-CM | POA: Diagnosis not present

## 2020-11-27 DIAGNOSIS — J80 Acute respiratory distress syndrome: Secondary | ICD-10-CM | POA: Diagnosis not present

## 2020-11-27 DIAGNOSIS — R0902 Hypoxemia: Secondary | ICD-10-CM | POA: Diagnosis not present

## 2020-11-27 LAB — CBC WITH DIFFERENTIAL/PLATELET
Abs Immature Granulocytes: 0.09 10*3/uL — ABNORMAL HIGH (ref 0.00–0.07)
Basophils Absolute: 0 10*3/uL (ref 0.0–0.1)
Basophils Relative: 0 %
Eosinophils Absolute: 0.3 10*3/uL (ref 0.0–0.5)
Eosinophils Relative: 2 %
HCT: 41.9 % (ref 39.0–52.0)
Hemoglobin: 13.7 g/dL (ref 13.0–17.0)
Immature Granulocytes: 1 %
Lymphocytes Relative: 11 %
Lymphs Abs: 1.1 10*3/uL (ref 0.7–4.0)
MCH: 32.4 pg (ref 26.0–34.0)
MCHC: 32.7 g/dL (ref 30.0–36.0)
MCV: 99.1 fL (ref 80.0–100.0)
Monocytes Absolute: 0.8 10*3/uL (ref 0.1–1.0)
Monocytes Relative: 8 %
Neutro Abs: 8.3 10*3/uL — ABNORMAL HIGH (ref 1.7–7.7)
Neutrophils Relative %: 78 %
Platelets: 124 10*3/uL — ABNORMAL LOW (ref 150–400)
RBC: 4.23 MIL/uL (ref 4.22–5.81)
RDW: 13 % (ref 11.5–15.5)
WBC: 10.7 10*3/uL — ABNORMAL HIGH (ref 4.0–10.5)
nRBC: 0 % (ref 0.0–0.2)

## 2020-11-27 LAB — COMPREHENSIVE METABOLIC PANEL
ALT: 15 U/L (ref 0–44)
AST: 23 U/L (ref 15–41)
Albumin: 3.6 g/dL (ref 3.5–5.0)
Alkaline Phosphatase: 83 U/L (ref 38–126)
Anion gap: 12 (ref 5–15)
BUN: 12 mg/dL (ref 8–23)
CO2: 25 mmol/L (ref 22–32)
Calcium: 8.6 mg/dL — ABNORMAL LOW (ref 8.9–10.3)
Chloride: 91 mmol/L — ABNORMAL LOW (ref 98–111)
Creatinine, Ser: 0.82 mg/dL (ref 0.61–1.24)
GFR, Estimated: >60 mL/min (ref >60)
Glucose, Bld: 162 mg/dL — ABNORMAL HIGH (ref 70–99)
Potassium: 3.4 mmol/L — ABNORMAL LOW (ref 3.5–5.1)
Sodium: 128 mmol/L — ABNORMAL LOW (ref 135–145)
Total Bilirubin: 1.2 mg/dL (ref 0.3–1.2)
Total Protein: 5.8 g/dL — ABNORMAL LOW (ref 6.5–8.1)

## 2020-11-27 LAB — RESP PANEL BY RT-PCR (FLU A&B, COVID) ARPGX2
Influenza A by PCR: NEGATIVE
Influenza B by PCR: NEGATIVE
SARS Coronavirus 2 by RT PCR: NEGATIVE

## 2020-11-27 LAB — TROPONIN I (HIGH SENSITIVITY): Troponin I (High Sensitivity): 18 ng/L — ABNORMAL HIGH (ref <18)

## 2020-11-27 LAB — BRAIN NATRIURETIC PEPTIDE: B Natriuretic Peptide: 140 pg/mL — ABNORMAL HIGH (ref 0.0–100.0)

## 2020-11-27 LAB — LIPASE, BLOOD: Lipase: 28 U/L (ref 11–51)

## 2020-11-27 NOTE — ED Triage Notes (Signed)
Brought in by [facility] from home, c/o sudden onset SOB - denies any hx of respiratory disease. Spo2 low 80s on room air with wheezing.  Albuterol 5mg   and atrovent0.5 given. 125mg  solumedrol. Wheezing has resolved. 96-100% on room air. Complaint free at this time.Swelling to left leg and abdominal distention.

## 2020-11-27 NOTE — ED Notes (Signed)
Able to ambulate with 93% on room air, Dr. Lyndel Safe made aware.

## 2020-11-27 NOTE — ED Provider Notes (Cosign Needed)
[facility] EMERGENCY DEPARTMENT Provider Note   CSN: [id number] Arrival date & time: 11/27/20  2110     History Chief Complaint  Patient presents with  . Shortness of Breath    Scott [name] is a 86 y.o. male.  HPI Patient is a 86 year old male with a history of CAD status post CABG, BPH, GERD, dyspnea on exertion who presents to ED with sudden onset shortness of breath.  Patient states that earlier this evening (approximately 2 hours ago) he was walking to the bathroom and had sudden onset of cough and shortness of breath. His coughing and shortness of breath were so severe that he was worried he would lose consciousness.  He first attempted to use his CPAP without relief, then decided to call EMS.  He feels much better following treatments received via EMS, including albuterol 5 mg, Atrovent 0.5, and Solu-Medrol 125 mg.  Per EMS, patient hypoxic to the 80s on EMS arrival.  Patient notes he has had wheezing for several weeks.  He notes that he has had a "cold" for the last 2-3 weeks.  Denies any known Covid exposures.  He also notes that he was recently changed off of Maxide due to concerns that it might have precipitated pancreatitis and says that since he has been on his new diuretic he has had to urinate so frequently that he has stopped taking a diuretic altogether for the last week or so.   He feels he has increased swelling in his bilateral lower legs (worse on the left) and his abdomen.    Past Medical History:  Diagnosis Date  . Anemia    Acute blood loss anemia secondary to surgery, followed fr/ Hip replacement   . Anxiety    pt. reports that he has occas. nervous episodes   . BPH (benign prostatic hyperplasia)   . CAD (coronary artery disease)    s/p CABG in 2007  CARDILOLOGIST IS DR. P. Martinique  . Cancer ([place])    melanoma - removed L ear - 2010  . Cranial neuropathy    resolved   . Generalized OA   . GERD (gastroesophageal reflux disease)   . H/O  echocardiogram    last echo, stress test 05/2012- pt. followed by [facility]   . Hypercholesterolemia   . Hypertension   . Obesity   . Osteoarthritis    End-stage osteoarthritis, right hip  . Sleep apnea    with use of CPAP  . SOB (shortness of breath)    WITH EXERTION  . Thrombocytopenia ([place])    possibly due to Lovenox    Patient Active Problem List   Diagnosis Date Noted  . Acute pancreatitis 04/17/2019  . Thrombocytopenia ([place])   . SOB (shortness of breath)   . Osteoarthritis   . Obesity   . Hypertension   . Hypercholesterolemia   . H/O echocardiogram   . GERD (gastroesophageal reflux disease)   . Cranial neuropathy   . Generalized OA   . Cancer ([place])   . CAD (coronary artery disease)   . BPH (benign prostatic hyperplasia)   . Anxiety   . Anemia   . Allergic rhinitis 09/09/2015  . Anxiety disorder 09/09/2015  . Atherosclerotic heart disease of native coronary artery without angina pectoris 09/09/2015  . Enlarged prostate without lower urinary tract symptoms (luts) 09/09/2015  . Constipation 09/09/2015  . Erectile dysfunction due to arterial insufficiency 09/09/2015  . Male erectile dysfunction 09/09/2015  . Pure hypercholesterolemia 09/09/2015  . Osteoarthritis of  hip 09/09/2015  . Osteoarthritis of knee 09/09/2015  . Malignant neoplasm of skin 09/09/2015  . Screening for gout 01/06/2015  . Essential (primary) hypertension 01/06/2015  . Inguinal hernia unilateral, non-recurrent, left 03/22/2013  . Umbilical hernia, asymptomatic 03/22/2013  . Acute bronchitis 07/11/2012  . DOE (dyspnea on exertion) 05/17/2012  . Hyperlipidemia 05/17/2011  . Obstructive sleep apnea 01/06/2008  . Essential hypertension 01/06/2008  . Insomnia 01/06/2008    Past Surgical History:  Procedure Laterality Date  . ACHILLES TENDON SURGERY  10/06/2012   Procedure: ACHILLES TENDON REPAIR;  Surgeon: Yvette Rack., MD;  Location: [place];  Service: Orthopedics;  Laterality: Right;  REPAIR  RIGHT RUPTURE ACHILLES TENDON PRIMARY OPEN/PERCUTANEOUS, EXCISION PARTIAL BONE TALUS/CANCANEUS, REPAIR PARTIAL EXCISION CALCANEUS  . APPENDECTOMY    . ARTERY BIOPSY     temporal - L side- wnl, double vision treated /w prednisone - 1991  . CALCANEAL OSTEOTOMY  10/06/2012   Procedure: CALCANEAL OSTEOTOMY;  Surgeon: Yvette Rack., MD;  Location: [place];  Service: Orthopedics;  Laterality: Right;  . CARDIAC CATHETERIZATION  12/15/2005   NORMAL. EF 60%, angioplasty 1995  . carpel tunnel surgery-bilateral     both hands   . CORONARY ARTERY BYPASS GRAFT  2007   LIMA GRAFT TO THE LAD, SAPHENOUS VEIN GRAFT TO THE FIRST OBTUSE MARIGNAL, SAPHENOUS VEIN GRAFT TO THE SECOND OBTUSE MARGINAL AND A SAPHENOUS VEIN GRAFT TO THE ACUTE MARGINAL AND POSTERIOR LATERAL BRANCH  . HERNIA REPAIR    . I & D EXTREMITY  12/11/2012   Procedure: IRRIGATION AND DEBRIDEMENT EXTREMITY;  Surgeon: Newt Minion, MD;  Location: [place];  Service: Orthopedics;  Laterality: Right;  Irrigation and Debridement achilles, wound closure, apply wound vac, place antibiotic beads  . INGUINAL HERNIA REPAIR Left 05/10/2013   Procedure: HERNIA REPAIR INGUINAL ADULT;  Surgeon: Earnstine Regal, MD;  Location: [facility];  Service: General;  Laterality: Left;  . INSERTION OF MESH Left 05/10/2013   Procedure: INSERTION OF MESH;  Surgeon: Earnstine Regal, MD;  Location: [facility];  Service: General;  Laterality: Left;  . JOINT REPLACEMENT    . knee  11/12   left arthroscopic  . TOTAL HIP ARTHROPLASTY     right       Family History  Problem Relation Age of Onset  . Prostate cancer Brother   . Heart disease Brother   . Heart disease Brother     Social History   Tobacco Use  . Smoking status: Never Smoker  . Smokeless tobacco: Never Used  Substance Use Topics  . Alcohol use: No    Alcohol/week: 0.0 standard drinks  . Drug use: No    Home Medications Prior to Admission medications   Medication Sig Start Date End Date Taking? Authorizing Provider   aspirin 81 MG tablet Take 81 mg by mouth every evening.    [provider]  finasteride (PROSCAR) 5 MG tablet Take 5 mg by mouth daily before breakfast.    [provider]  fish oil-omega-3 fatty acids 1000 MG capsule Take 1 g by mouth every other day.    [provider]  furosemide (LASIX) 20 MG tablet TAKE 1 TABLET BY MOUTH ONCE EACH MORNING AS NEEDED FOR SWELLING/EDEMA 08/26/19   [provider]  hydrochlorothiazide (HYDRODIURIL) 12.5 MG tablet Take 12.5 mg by mouth every morning. 12/13/19   [provider]  Influenza Vac High-Dose Quad (FLUZONE HIGH-DOSE QUADRIVALENT) 0.7 ML SUSY Fluzone High-Dose Quad 2020-21 (PF) 240  mcg/0.7 mL IM syringe  PHARMACY ADMINISTERED    [provider]  lovastatin (MEVACOR) 20 MG tablet Take 20 mg by mouth at bedtime.    [provider]  methylPREDNISolone (MEDROL DOSEPAK) 4 MG TBPK tablet As directed 05/16/20   Martinique, Peter M, MD  metoprolol (LOPRESSOR) 50 MG tablet Take 25 mg by mouth 2 (two) times daily.    [provider]  potassium chloride (K-DUR,KLOR-CON) 10 MEQ tablet Take 10 mEq by mouth every evening.     [provider]  potassium chloride (KLOR-CON) 10 MEQ tablet potassium chloride ER 10 mEq tablet,extended release  TK 1 T PO QD    [provider]  Tamsulosin HCl (FLOMAX) 0.4 MG CAPS Take 0.4 mg by mouth daily after supper.    [provider]  telmisartan (MICARDIS) 40 MG tablet Take 40 mg by mouth daily. 08/14/19   [provider]  traZODone (DESYREL) 50 MG tablet Take 1 tablet (50 mg total) by mouth at bedtime. 12/03/14   Young, Tarri Fuller D, MD  triazolam (HALCION) 0.25 MG tablet TAKE 1 TABLET (0.25 MG TOTAL) BY MOUTH AT BEDTIME AS NEEDED FOR SLEEP. 11/05/20   Baird Lyons D, MD  vitamin E 400 UNIT capsule Take 400 Units by mouth daily.     [provider]    Allergies    Acetaminophen, Amlodipine, Bactrim  [sulfamethoxazole-trimethoprim], Codeine, Fexofenadine, Ibuprofen, Lovenox [enoxaparin sodium], Naproxen, Prednisone, Shellfish allergy, and Sonata [zaleplon]  Review of Systems   Review of Systems  Constitutional: Negative for chills and fever.  HENT: Positive for rhinorrhea. Negative for ear pain and sore throat.   Eyes: Negative for pain and visual disturbance.  Respiratory: Positive for cough, shortness of breath and wheezing.   Cardiovascular: Positive for leg swelling (bilateral, L > R). Negative for chest pain.  Gastrointestinal: Positive for abdominal distention. Negative for abdominal pain, blood in stool, nausea and vomiting.  Genitourinary: Negative for dysuria and hematuria.  Musculoskeletal: Negative for gait problem and joint swelling.  Skin: Negative for color change and rash.  Neurological: Positive for light-headedness. Negative for syncope and headaches.  Psychiatric/Behavioral: Negative for confusion and suicidal ideas.  All other systems reviewed and are negative.   Physical Exam Updated Vital Signs BP (!) 160/86   Pulse 78   Temp 98.7 F (37.1 C) (Oral)   Resp 20   Ht 5\' 5"  (1.651 m)   Wt 86 kg   SpO2 95%   BMI 31.55 kg/m   Physical Exam Vitals and nursing note reviewed.  Constitutional:      General: He is not in acute distress.    Appearance: He is well-developed and well-nourished. He is obese. He is not ill-appearing or toxic-appearing.  HENT:     Head: Normocephalic and atraumatic.     Mouth/Throat:     Mouth: Mucous membranes are moist.     Pharynx: Oropharynx is clear.  Eyes:     Conjunctiva/sclera: Conjunctivae normal.     Pupils: Pupils are equal, round, and reactive to light.  Cardiovascular:     Rate and Rhythm: Normal rate and regular rhythm.     Pulses: Normal pulses.     Heart sounds: No friction rub. No gallop.   Pulmonary:     Effort: Pulmonary effort is normal. No respiratory distress.     Breath sounds: Normal breath sounds. No  wheezing, rhonchi or rales.  Abdominal:     Palpations: Abdomen is soft.     Tenderness: There is no  abdominal tenderness. There is no guarding or rebound.  Musculoskeletal:     Cervical back: Neck supple.     Right lower leg: Edema present.     Left lower leg: Edema present.     Comments: Trace pitting edema to bilateral lower legs, worse on the left  Skin:    General: Skin is warm and dry.  Neurological:     Mental Status: He is alert.     Comments: Alert, grossly oriented, moves all extremities spontaneously  Psychiatric:        Mood and Affect: Mood and affect normal.        Behavior: Behavior normal.     ED Results / Procedures / Treatments   Labs (all labs ordered are listed, but only abnormal results are displayed) Labs Reviewed  CBC WITH DIFFERENTIAL/PLATELET - Abnormal; Notable for the following components:      Result Value   WBC 10.7 (*)    Platelets 124 (*)    Neutro Abs 8.3 (*)    Abs Immature Granulocytes 0.09 (*)    All other components within normal limits  COMPREHENSIVE METABOLIC PANEL - Abnormal; Notable for the following components:   Sodium 128 (*)    Potassium 3.4 (*)    Chloride 91 (*)    Glucose, Bld 162 (*)    Calcium 8.6 (*)    Total Protein 5.8 (*)    All other components within normal limits  BRAIN NATRIURETIC PEPTIDE - Abnormal; Notable for the following components:   B Natriuretic Peptide 140.0 (*)    All other components within normal limits  TROPONIN I (HIGH SENSITIVITY) - Abnormal; Notable for the following components:   Troponin I (High Sensitivity) 18 (*)    All other components within normal limits  RESP PANEL BY RT-PCR (FLU A&B, COVID) ARPGX2  LIPASE, BLOOD  TROPONIN I (HIGH SENSITIVITY)    EKG EKG Interpretation  Date/Time:  Thursday November 27 2020 21:21:28 EST Ventricular Rate:  78 PR Interval:    QRS Duration: 105 QT Interval:  393 QTC Calculation: 448 R Axis:   39 Text Interpretation: Sinus rhythm Short PR interval  Abnormal R-wave progression, early transition Minimal ST depression, lateral leads No STEMI Confirmed by Alona BeneLong, Joshua [phone]([id number]) on 11/27/2020 9:24:11 PM   Radiology DG Chest Portable 1 View  Result Date: 11/27/2020 CLINICAL DATA:  Shortness of breath. EXAM: PORTABLE CHEST 1 VIEW COMPARISON:  Apr 16, 2019 FINDINGS: Multiple sternal wires and vascular clips are seen. There is no evidence of acute infiltrate, pleural effusion or pneumothorax. There is mild to moderate severity enlargement of the cardiac silhouette. Moderate to marked severity calcification of the aortic arch is noted. Multilevel degenerative changes are seen throughout the thoracic spine. IMPRESSION: 1. Evidence of prior median sternotomy/CABG without acute or active cardiopulmonary disease. Electronically Signed   By: Aram Candelahaddeus  Houston M.D.   On: 11/27/2020 22:17    Procedures Procedures (including critical care time)  Medications Ordered in ED Medications - No data to display  ED Course  I have reviewed the triage vital signs and the nursing notes.  Pertinent labs & imaging results that were available during my care of the patient were reviewed by me and considered in my medical decision making (see chart for details).    MDM Rules/Calculators/A&P                         86 y/o male with dyspnea.  Patient is very well-appearing and,  although mildly tachypneic, he is hemodynamically stable on arrival.  He says that he feels completely better and wants to go home.  I am most concerned for pneumonia vs bronchitis, but differential includes undiagnosed COPD.  There is some concern for pulmonary embolism given that patient has asymmetric lower extremity edema, but patient notes that he has had problems with L lower leg edema for quite a long time and has had multiple ultrasounds of the LLE prior; denies changes in his swelling that are out of the ordinary for him.  Labs: reviewed and notable for hyponatremia (128), lipase 28, BNP  140, troponin 18.  Very mild leukocytosis (10.7).  COVID-19 and flu A/B are negative.  Troponin pending. ECG: sinus rhythm with a rate of 78. QRS 105, QTc 448. Normal axis. There is no STEMI and no evidence of acute ischemia/infarct. Imaging: reviewed and notable for mild enlargement of the cardiac silhouette and degenerative changes without evidence of acute cardiopulmonary disease.  On reassessment, patient continues to feel well.  Observed patient ambulating on room air and he did not desaturate while ambulating; brief desaturation to 93% once he sat back down.  Patient denies feeling dyspneic, denies chest pain, denies lightheadedness, and says he feels well.  Pt notes that after discussing the situation with his daughter, he thinks his symptoms may have been due to an allergic reaction.  He took his normal nighttime medications, a dose of TheraFlu, and then a dose of nasal spray back to back just before he started to develop symptoms.  As he was walking to the bathroom, he started to have diffuse itching and then became short of breath.  He says he felt almost instant improvement when EMS provided their treatments.  Based on his description of the story impression is likely viral bronchitis versus allergic reaction to one of his OTC cold/flu remedies.  Encouraged patient to avoid both of these medications.  At time of handoff to night team, patient repeat troponin is pending.  Have communicated history, work-up, and current plan to oncoming ED providers.  Patient remains in stable condition and is interested in going home if possible.  Have discussed symptomatic treatment options with him.  Final Clinical Impression(s) / ED Diagnoses Final diagnoses:  Acute respiratory distress  Shortness of breath    Rx / DC Orders ED Discharge Orders         Ordered    albuterol (VENTOLIN HFA) 108 (90 Base) MCG/ACT inhaler  Every 6 hours PRN        11/28/20 0037    EPINEPHrine 0.3 mg/0.3 mL IJ SOAJ  injection  As needed        11/28/20 0037           Vanna Scotland, MD 49/17/91 [id number]

## 2020-11-28 LAB — TROPONIN I (HIGH SENSITIVITY): Troponin I (High Sensitivity): 43 ng/L — ABNORMAL HIGH (ref <18)

## 2020-11-28 MED ORDER — ALBUTEROL SULFATE HFA 108 (90 BASE) MCG/ACT IN AERS: 1.0000 | INHALATION_SPRAY | Freq: Four times a day (QID) | RESPIRATORY_TRACT | 0 refills | Status: DC | PRN

## 2020-11-28 MED ORDER — EPINEPHRINE 0.3 MG/0.3ML IJ SOAJ: 0.3000 mg | INTRAMUSCULAR | 0 refills | Status: DC | PRN

## 2020-11-28 NOTE — Discharge Instructions (Addendum)
Your cardiac tests today were elevated.  We have recommended admission for further cardiac testing but you have decided to go home. You need to monitor your symptoms closely over the next 24-48 hours and return here immediately for any new chest pain, SOB, vomiting, sweating, dizziness, fainting spells, etc.

## 2020-11-28 NOTE — ED Provider Notes (Signed)
Assumed care at shift change.  See prior note for full H&P.  Briefly, 86 y.o. M here with episode of sudden onset SOB.  No chest pain.  After further questioning, seems he may have had an allergic reaction to nasal spray and/or theraflu as he used these just prior to onset of symptoms.  Initial trop 18.  Has ambulated and maintained normal saturation.  Plan:  Delta trop pending.  1:43 AM Delta trop elevated at 43, change of 25 from prior.  Patient continues to feel well without any ongoing symptoms.  He continues to feel it was a side effect from some of the OTC medications he was taking.  I have explained to patient and daughter that with these elevated troponins and his known cardiac history I would recommend admission with further cardiac work-up/monitoring.  He is adamantly opposed to this as he wants to spend holiday with his family.  He is aware that without further work-up, I cannot definitively say that acute cardiac event is not occurring or will not occur in the foreseeable future.  This may result in permanent disability or death.  He acknowledged these risks but still wishes to go home.  He is of sound mind, capable of making his own medical decisions.  He will sign out AMA.  He will monitor symptoms closely over the next 24 to 48 hours and return here immediately for any new or acute changes.  Close follow-up with PCP/cardiology.  Return here for any new/acute changes.   Larene Pickett, PA-C 11/28/20 0220    Ripley Fraise, MD 11/28/20 [phone]

## 2020-12-10 DIAGNOSIS — I2510 Atherosclerotic heart disease of native coronary artery without angina pectoris: Secondary | ICD-10-CM | POA: Diagnosis not present

## 2020-12-10 DIAGNOSIS — R0600 Dyspnea, unspecified: Secondary | ICD-10-CM | POA: Diagnosis not present

## 2020-12-10 DIAGNOSIS — E871 Hypo-osmolality and hyponatremia: Secondary | ICD-10-CM | POA: Diagnosis not present

## 2020-12-10 DIAGNOSIS — D696 Thrombocytopenia, unspecified: Secondary | ICD-10-CM | POA: Diagnosis not present

## 2020-12-16 DIAGNOSIS — X32XXXD Exposure to sunlight, subsequent encounter: Secondary | ICD-10-CM | POA: Diagnosis not present

## 2020-12-16 DIAGNOSIS — D0439 Carcinoma in situ of skin of other parts of face: Secondary | ICD-10-CM | POA: Diagnosis not present

## 2020-12-16 DIAGNOSIS — Z08 Encounter for follow-up examination after completed treatment for malignant neoplasm: Secondary | ICD-10-CM | POA: Diagnosis not present

## 2020-12-16 DIAGNOSIS — Z85820 Personal history of malignant melanoma of skin: Secondary | ICD-10-CM | POA: Diagnosis not present

## 2020-12-16 DIAGNOSIS — D225 Melanocytic nevi of trunk: Secondary | ICD-10-CM | POA: Diagnosis not present

## 2020-12-16 DIAGNOSIS — Z1283 Encounter for screening for malignant neoplasm of skin: Secondary | ICD-10-CM | POA: Diagnosis not present

## 2020-12-16 DIAGNOSIS — L570 Actinic keratosis: Secondary | ICD-10-CM | POA: Diagnosis not present

## 2020-12-17 DIAGNOSIS — Z008 Encounter for other general examination: Secondary | ICD-10-CM | POA: Diagnosis not present

## 2020-12-17 DIAGNOSIS — I10 Essential (primary) hypertension: Secondary | ICD-10-CM | POA: Diagnosis not present

## 2020-12-17 DIAGNOSIS — E785 Hyperlipidemia, unspecified: Secondary | ICD-10-CM | POA: Diagnosis not present

## 2020-12-17 DIAGNOSIS — N529 Male erectile dysfunction, unspecified: Secondary | ICD-10-CM | POA: Diagnosis not present

## 2020-12-17 DIAGNOSIS — N400 Enlarged prostate without lower urinary tract symptoms: Secondary | ICD-10-CM | POA: Diagnosis not present

## 2020-12-17 DIAGNOSIS — R69 Illness, unspecified: Secondary | ICD-10-CM | POA: Diagnosis not present

## 2020-12-17 DIAGNOSIS — K5900 Constipation, unspecified: Secondary | ICD-10-CM | POA: Diagnosis not present

## 2020-12-17 DIAGNOSIS — R32 Unspecified urinary incontinence: Secondary | ICD-10-CM | POA: Diagnosis not present

## 2020-12-17 DIAGNOSIS — I2510 Atherosclerotic heart disease of native coronary artery without angina pectoris: Secondary | ICD-10-CM | POA: Diagnosis not present

## 2020-12-17 DIAGNOSIS — K219 Gastro-esophageal reflux disease without esophagitis: Secondary | ICD-10-CM | POA: Diagnosis not present

## 2020-12-17 DIAGNOSIS — E669 Obesity, unspecified: Secondary | ICD-10-CM | POA: Diagnosis not present

## 2020-12-18 DIAGNOSIS — N400 Enlarged prostate without lower urinary tract symptoms: Secondary | ICD-10-CM | POA: Diagnosis not present

## 2020-12-18 DIAGNOSIS — R062 Wheezing: Secondary | ICD-10-CM | POA: Diagnosis not present

## 2020-12-18 DIAGNOSIS — E7800 Pure hypercholesterolemia, unspecified: Secondary | ICD-10-CM | POA: Diagnosis not present

## 2020-12-18 DIAGNOSIS — I2510 Atherosclerotic heart disease of native coronary artery without angina pectoris: Secondary | ICD-10-CM | POA: Diagnosis not present

## 2020-12-18 DIAGNOSIS — Z0000 Encounter for general adult medical examination without abnormal findings: Secondary | ICD-10-CM | POA: Diagnosis not present

## 2020-12-18 DIAGNOSIS — I10 Essential (primary) hypertension: Secondary | ICD-10-CM | POA: Diagnosis not present

## 2020-12-18 DIAGNOSIS — Z1389 Encounter for screening for other disorder: Secondary | ICD-10-CM | POA: Diagnosis not present

## 2020-12-18 DIAGNOSIS — I700 Atherosclerosis of aorta: Secondary | ICD-10-CM | POA: Diagnosis not present

## 2020-12-18 DIAGNOSIS — R609 Edema, unspecified: Secondary | ICD-10-CM | POA: Diagnosis not present

## 2020-12-22 ENCOUNTER — Telehealth: Payer: Self-pay | Admitting: Internal Medicine

## 2020-12-22 DIAGNOSIS — M169 Osteoarthritis of hip, unspecified: Secondary | ICD-10-CM | POA: Diagnosis not present

## 2020-12-22 DIAGNOSIS — E7800 Pure hypercholesterolemia, unspecified: Secondary | ICD-10-CM | POA: Diagnosis not present

## 2020-12-22 DIAGNOSIS — M179 Osteoarthritis of knee, unspecified: Secondary | ICD-10-CM | POA: Diagnosis not present

## 2020-12-22 DIAGNOSIS — N400 Enlarged prostate without lower urinary tract symptoms: Secondary | ICD-10-CM | POA: Diagnosis not present

## 2020-12-22 DIAGNOSIS — I10 Essential (primary) hypertension: Secondary | ICD-10-CM | POA: Diagnosis not present

## 2020-12-22 DIAGNOSIS — I2510 Atherosclerotic heart disease of native coronary artery without angina pectoris: Secondary | ICD-10-CM | POA: Diagnosis not present

## 2020-12-22 NOTE — Telephone Encounter (Signed)
Received a PA request for triazolam 0.25mg  via CoverMyMeds. Information was submitted via [email or web address] but received a statement stating the PA would not be completed online. Will need to fax a request to [facility]. Will print and fill out forms tomorrow once back at the office.

## 2020-12-25 DIAGNOSIS — M1711 Unilateral primary osteoarthritis, right knee: Secondary | ICD-10-CM | POA: Diagnosis not present

## 2020-12-25 DIAGNOSIS — M25562 Pain in left knee: Secondary | ICD-10-CM | POA: Diagnosis not present

## 2020-12-25 DIAGNOSIS — M1712 Unilateral primary osteoarthritis, left knee: Secondary | ICD-10-CM | POA: Diagnosis not present

## 2020-12-25 DIAGNOSIS — M170 Bilateral primary osteoarthritis of knee: Secondary | ICD-10-CM | POA: Diagnosis not present

## 2021-01-02 DIAGNOSIS — I10 Essential (primary) hypertension: Secondary | ICD-10-CM | POA: Diagnosis not present

## 2021-01-05 DIAGNOSIS — I10 Essential (primary) hypertension: Secondary | ICD-10-CM | POA: Diagnosis not present

## 2021-01-07 NOTE — Progress Notes (Signed)
HPI  M never smoker followed for OSA, insomnia, rhinitis, complicated by CAD/CABG NPSG 10/14/2002: AHI of 15.9 per hour; CPAP titrated to 8 CWP   body weight 180 pounds NPSG 07/30/12- AHI 6.7 / hr  desat <88% for 37.3 minutes without CPAP  ------------------------------------------------------------------------- 01/10/20- 87 year old male never smoker followed for OSA, Insomnia, rhinitis, complicated by CAD/CABG, HTN, GERD, BPH, thrombocytopenia, Pancreatitis,  CPAP "aggravating" - not using much in recent weeks CPAP 10/ [employer]- replaced in 2018 Download compliance 100%, AHI 0.5/ hr                 Wife here Wears about 4 hours, but sleeps better w/o CPAP. Restless and moving. Notes some wheeze/ whistle in the evening as he tries to sleep. Says "every little thing bothers me". After pancreatitis his diuretic was changed and he notes peripheral edema now.  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  01/08/21-  87 year old male never smoker followed for OSA, Insomnia, rhinitis, complicated by CAD/CABG, HTN, GERD, BPH, thrombocytopenia, Pancreatitis,  Albuterol hfa, Breo 100,  CPAP auto 5-15/ [employer]- replaced in 2018 Download- compliance 90%, AHI 1/ hr Body weight today-188 lbs Covid vax-3 Phizer Flu vax-had ED 11/28/20- SOB- signed out AMA Christmas Eve Reports Breo and rescue inhaler help his breathing. Insomnia still an issue. Belsomra was too expensive. He settled on Trazodone 25 mg (more gives "crazy dreams") with !/2 Halcion. CPAP ok- some dry mouth.   CXR 1V- 11/27/20- IMPRESSION: 1. Evidence of prior median sternotomy/CABG without acute or active cardiopulmonary disease.  ROS-see HPI + = positive Constitutional:   No-   weight loss, night sweats, fevers, chills, +fatigue, lassitude. HEENT:   No-  headaches, difficulty swallowing, tooth/dental problems, sore throat,       No-  sneezing, itching, ear ache, +nasal congestion, post nasal drip,  CV:  No-   chest pain, orthopnea, PND,  +swelling in lower extremities, No-anasarca, dizziness, palpitations Resp: +  shortness of breath with exertion or at rest.              No-   productive cough,  No non-productive cough,  No- coughing up of blood.              No-   change in color of mucus.+ wheezing.   Skin: No-   rash or lesions. GI:  No-   heartburn, indigestion, abdominal pain, nausea, vomiting,  GU: n. MS:  No-   joint pain or swelling.   Neuro-     nothing unusual Psych:  No- change in mood or affect. No depression or anxiety.  No memory loss.  OBJ- Physical Exam General- Alert, Oriented, Affect-appropriate, Distress- none acute,  Skin- rash-none, lesions- none, excoriation- none Lymphadenopathy- none Head- atraumatic            Eyes- Gross vision intact, PERRLA, conjunctivae and secretions clear            Ears- Hearing, canals-normal            Nose- Clear, no-Septal dev, mucus, polyps, erosion, perforation             Throat- Mallampati III-IV , mucosa clear , drainage- none, tonsils- atrophic Neck- flexible , trachea midline, no stridor , thyroid nl, carotid no bruit Chest - symmetrical excursion , unlabored           Heart/CV- RRR , no murmur , no gallop  , no rub, nl s1 s2                           -  JVD- none , edema+1, stasis changes- none, varices- none           Lung- clear to P&A, wheeze- none, cough- none , dullness-none, rub- none           Chest wall-  Abd-  Br/ Gen/ Rectal- Not done, not indicated Extrem- cyanosis- none, clubbing, none, atrophy- none, strength- nl Neuro- grossly intact to observation

## 2021-01-08 ENCOUNTER — Ambulatory Visit: Payer: Medicare HMO | Admitting: Internal Medicine

## 2021-01-08 ENCOUNTER — Encounter: Payer: Self-pay | Admitting: Internal Medicine

## 2021-01-08 ENCOUNTER — Other Ambulatory Visit: Payer: Self-pay

## 2021-01-08 VITALS — BP 118/60 | HR 59 | Temp 97.9°F | Ht 65.0 in | Wt 188.6 lb

## 2021-01-08 DIAGNOSIS — G4733 Obstructive sleep apnea (adult) (pediatric): Secondary | ICD-10-CM

## 2021-01-08 DIAGNOSIS — F5101 Primary insomnia: Secondary | ICD-10-CM

## 2021-01-08 DIAGNOSIS — R69 Illness, unspecified: Secondary | ICD-10-CM | POA: Diagnosis not present

## 2021-01-08 NOTE — Patient Instructions (Signed)
Order- DME- [employer]- please help Scott [name] learn how to adjust humidifier, Continue auto 5-15.  We agreed to continue current sleep meds.  Please call if we can help

## 2021-01-12 NOTE — Assessment & Plan Note (Signed)
Safety discussion about sedative hypnotics and age.  Ok to continue conservative use of trazodone, halcion as discussed.

## 2021-01-12 NOTE — Assessment & Plan Note (Signed)
Benefits from CPAP with good compliance and control Plan- continue auto 5-15, adjust humidifier for dryness.

## 2021-03-02 DIAGNOSIS — I10 Essential (primary) hypertension: Secondary | ICD-10-CM | POA: Diagnosis not present

## 2021-03-02 DIAGNOSIS — E7800 Pure hypercholesterolemia, unspecified: Secondary | ICD-10-CM | POA: Diagnosis not present

## 2021-03-02 DIAGNOSIS — N400 Enlarged prostate without lower urinary tract symptoms: Secondary | ICD-10-CM | POA: Diagnosis not present

## 2021-03-02 DIAGNOSIS — M169 Osteoarthritis of hip, unspecified: Secondary | ICD-10-CM | POA: Diagnosis not present

## 2021-03-02 DIAGNOSIS — I2510 Atherosclerotic heart disease of native coronary artery without angina pectoris: Secondary | ICD-10-CM | POA: Diagnosis not present

## 2021-03-02 DIAGNOSIS — M179 Osteoarthritis of knee, unspecified: Secondary | ICD-10-CM | POA: Diagnosis not present

## 2021-03-05 DIAGNOSIS — I10 Essential (primary) hypertension: Secondary | ICD-10-CM | POA: Diagnosis not present

## 2021-03-13 DIAGNOSIS — M1712 Unilateral primary osteoarthritis, left knee: Secondary | ICD-10-CM | POA: Diagnosis not present

## 2021-03-13 DIAGNOSIS — M170 Bilateral primary osteoarthritis of knee: Secondary | ICD-10-CM | POA: Diagnosis not present

## 2021-03-13 DIAGNOSIS — M25562 Pain in left knee: Secondary | ICD-10-CM | POA: Diagnosis not present

## 2021-03-13 DIAGNOSIS — M1711 Unilateral primary osteoarthritis, right knee: Secondary | ICD-10-CM | POA: Diagnosis not present

## 2021-05-01 DIAGNOSIS — E7800 Pure hypercholesterolemia, unspecified: Secondary | ICD-10-CM | POA: Diagnosis not present

## 2021-05-01 DIAGNOSIS — I10 Essential (primary) hypertension: Secondary | ICD-10-CM | POA: Diagnosis not present

## 2021-05-01 DIAGNOSIS — I2510 Atherosclerotic heart disease of native coronary artery without angina pectoris: Secondary | ICD-10-CM | POA: Diagnosis not present

## 2021-05-01 DIAGNOSIS — M179 Osteoarthritis of knee, unspecified: Secondary | ICD-10-CM | POA: Diagnosis not present

## 2021-05-01 DIAGNOSIS — M169 Osteoarthritis of hip, unspecified: Secondary | ICD-10-CM | POA: Diagnosis not present

## 2021-05-01 DIAGNOSIS — N400 Enlarged prostate without lower urinary tract symptoms: Secondary | ICD-10-CM | POA: Diagnosis not present

## 2021-05-26 ENCOUNTER — Other Ambulatory Visit: Payer: Self-pay | Admitting: Internal Medicine

## 2021-05-26 NOTE — Telephone Encounter (Signed)
Dr. Annamaria Boots, please advise on med refill.  Allergies  Allergen Reactions   Acetaminophen     Other reaction(s): mouth sores   Amlodipine Swelling    Swelling of feet and legs   Bactrim [Sulfamethoxazole-Trimethoprim] Other (See Comments)    Constipation    Codeine Nausea Only   Fexofenadine     Other reaction(s): mouth sores   Ibuprofen     Other reaction(s): nervousness   Lovenox [Enoxaparin Sodium] Other (See Comments)    MAY HAVE CAUSED THROMBOCYTOPENIA   Naproxen     Other reaction(s): mouth sores   Prednisone Swelling    Patient has tolerated doses for colds. Thinks it caused swelling in the past   Shellfish Allergy Nausea And Vomiting and Other (See Comments)    N&V, shaking   Sonata [Zaleplon] Other (See Comments)    Hyper feelings     Current Outpatient Medications:    albuterol (VENTOLIN HFA) 108 (90 Base) MCG/ACT inhaler, Inhale 1-2 puffs into the lungs every 6 (six) hours as needed for wheezing or shortness of breath., Disp: 1 each, Rfl: 0   aspirin 81 MG tablet, Take 81 mg by mouth every evening., Disp: , Rfl:    BREO ELLIPTA 100-25 MCG/INH AEPB, Inhale 1 puff into the lungs daily., Disp: , Rfl:    EPINEPHrine 0.3 mg/0.3 mL IJ SOAJ injection, Inject 0.3 mg into the muscle as needed for anaphylaxis., Disp: 1 each, Rfl: 0   finasteride (PROSCAR) 5 MG tablet, Take 5 mg by mouth daily before breakfast., Disp: , Rfl:    fish oil-omega-3 fatty acids 1000 MG capsule, Take 1 g by mouth every other day., Disp: , Rfl:    furosemide (LASIX) 20 MG tablet, TAKE 1 TABLET BY MOUTH ONCE EACH MORNING AS NEEDED FOR SWELLING/EDEMA, Disp: , Rfl:    hydrochlorothiazide (HYDRODIURIL) 12.5 MG tablet, Take 12.5 mg by mouth every morning., Disp: , Rfl:    Influenza Vac High-Dose Quad (FLUZONE HIGH-DOSE QUADRIVALENT) 0.7 ML SUSY, Fluzone High-Dose Quad 2020-21 (PF) 240 mcg/0.7 mL IM syringe  PHARMACY ADMINISTERED, Disp: , Rfl:    lovastatin (MEVACOR) 20 MG tablet, Take 20 mg by mouth at  bedtime., Disp: , Rfl:    methylPREDNISolone (MEDROL DOSEPAK) 4 MG TBPK tablet, As directed, Disp: 21 tablet, Rfl: 0   metoprolol (LOPRESSOR) 50 MG tablet, Take 25 mg by mouth 2 (two) times daily., Disp: , Rfl:    potassium chloride (K-DUR,KLOR-CON) 10 MEQ tablet, Take 10 mEq by mouth every evening. , Disp: , Rfl:    potassium chloride (KLOR-CON) 10 MEQ tablet, potassium chloride ER 10 mEq tablet,extended release  TK 1 T PO QD, Disp: , Rfl:    Tamsulosin HCl (FLOMAX) 0.4 MG CAPS, Take 0.4 mg by mouth daily after supper., Disp: , Rfl:    telmisartan (MICARDIS) 40 MG tablet, Take 40 mg by mouth daily., Disp: , Rfl:    traZODone (DESYREL) 50 MG tablet, Take 1 tablet (50 mg total) by mouth at bedtime., Disp: 30 tablet, Rfl: prn   triazolam (HALCION) 0.25 MG tablet, TAKE 1 TABLET (0.25 MG TOTAL) BY MOUTH AT BEDTIME AS NEEDED FOR SLEEP., Disp: 30 tablet, Rfl: 5   vitamin E 400 UNIT capsule, Take 400 Units by mouth daily. , Disp: , Rfl:

## 2021-05-26 NOTE — Telephone Encounter (Signed)
Triazolam refilled

## 2021-05-29 ENCOUNTER — Other Ambulatory Visit: Payer: Self-pay

## 2021-05-29 NOTE — Telephone Encounter (Signed)
Refill request sent in from pharmacy.  Dr. Annamaria Boots please advise.  Thank you

## 2021-06-04 DIAGNOSIS — I10 Essential (primary) hypertension: Secondary | ICD-10-CM | POA: Diagnosis not present

## 2021-06-05 DIAGNOSIS — M1711 Unilateral primary osteoarthritis, right knee: Secondary | ICD-10-CM | POA: Diagnosis not present

## 2021-06-05 DIAGNOSIS — M25561 Pain in right knee: Secondary | ICD-10-CM | POA: Diagnosis not present

## 2021-06-05 DIAGNOSIS — M170 Bilateral primary osteoarthritis of knee: Secondary | ICD-10-CM | POA: Diagnosis not present

## 2021-06-05 DIAGNOSIS — M25562 Pain in left knee: Secondary | ICD-10-CM | POA: Diagnosis not present

## 2021-06-16 DIAGNOSIS — Z1283 Encounter for screening for malignant neoplasm of skin: Secondary | ICD-10-CM | POA: Diagnosis not present

## 2021-06-16 DIAGNOSIS — D225 Melanocytic nevi of trunk: Secondary | ICD-10-CM | POA: Diagnosis not present

## 2021-06-16 DIAGNOSIS — X32XXXD Exposure to sunlight, subsequent encounter: Secondary | ICD-10-CM | POA: Diagnosis not present

## 2021-06-16 DIAGNOSIS — C4441 Basal cell carcinoma of skin of scalp and neck: Secondary | ICD-10-CM | POA: Diagnosis not present

## 2021-06-16 DIAGNOSIS — Z85820 Personal history of malignant melanoma of skin: Secondary | ICD-10-CM | POA: Diagnosis not present

## 2021-06-16 DIAGNOSIS — Z08 Encounter for follow-up examination after completed treatment for malignant neoplasm: Secondary | ICD-10-CM | POA: Diagnosis not present

## 2021-06-16 DIAGNOSIS — L570 Actinic keratosis: Secondary | ICD-10-CM | POA: Diagnosis not present

## 2021-06-17 DIAGNOSIS — I2510 Atherosclerotic heart disease of native coronary artery without angina pectoris: Secondary | ICD-10-CM | POA: Diagnosis not present

## 2021-06-17 DIAGNOSIS — Z8719 Personal history of other diseases of the digestive system: Secondary | ICD-10-CM | POA: Diagnosis not present

## 2021-06-17 DIAGNOSIS — I872 Venous insufficiency (chronic) (peripheral): Secondary | ICD-10-CM | POA: Diagnosis not present

## 2021-06-17 DIAGNOSIS — I10 Essential (primary) hypertension: Secondary | ICD-10-CM | POA: Diagnosis not present

## 2021-06-17 DIAGNOSIS — N400 Enlarged prostate without lower urinary tract symptoms: Secondary | ICD-10-CM | POA: Diagnosis not present

## 2021-07-02 DIAGNOSIS — E7800 Pure hypercholesterolemia, unspecified: Secondary | ICD-10-CM | POA: Diagnosis not present

## 2021-07-02 DIAGNOSIS — I2510 Atherosclerotic heart disease of native coronary artery without angina pectoris: Secondary | ICD-10-CM | POA: Diagnosis not present

## 2021-07-02 DIAGNOSIS — M169 Osteoarthritis of hip, unspecified: Secondary | ICD-10-CM | POA: Diagnosis not present

## 2021-07-02 DIAGNOSIS — N400 Enlarged prostate without lower urinary tract symptoms: Secondary | ICD-10-CM | POA: Diagnosis not present

## 2021-07-02 DIAGNOSIS — I10 Essential (primary) hypertension: Secondary | ICD-10-CM | POA: Diagnosis not present

## 2021-07-02 DIAGNOSIS — M179 Osteoarthritis of knee, unspecified: Secondary | ICD-10-CM | POA: Diagnosis not present

## 2021-09-01 DIAGNOSIS — Z08 Encounter for follow-up examination after completed treatment for malignant neoplasm: Secondary | ICD-10-CM | POA: Diagnosis not present

## 2021-09-01 DIAGNOSIS — X32XXXD Exposure to sunlight, subsequent encounter: Secondary | ICD-10-CM | POA: Diagnosis not present

## 2021-09-01 DIAGNOSIS — L570 Actinic keratosis: Secondary | ICD-10-CM | POA: Diagnosis not present

## 2021-09-01 DIAGNOSIS — Z85828 Personal history of other malignant neoplasm of skin: Secondary | ICD-10-CM | POA: Diagnosis not present

## 2021-09-01 DIAGNOSIS — C4441 Basal cell carcinoma of skin of scalp and neck: Secondary | ICD-10-CM | POA: Diagnosis not present

## 2021-09-02 DIAGNOSIS — E7800 Pure hypercholesterolemia, unspecified: Secondary | ICD-10-CM | POA: Diagnosis not present

## 2021-09-02 DIAGNOSIS — I2510 Atherosclerotic heart disease of native coronary artery without angina pectoris: Secondary | ICD-10-CM | POA: Diagnosis not present

## 2021-09-02 DIAGNOSIS — M179 Osteoarthritis of knee, unspecified: Secondary | ICD-10-CM | POA: Diagnosis not present

## 2021-09-02 DIAGNOSIS — M169 Osteoarthritis of hip, unspecified: Secondary | ICD-10-CM | POA: Diagnosis not present

## 2021-09-02 DIAGNOSIS — I10 Essential (primary) hypertension: Secondary | ICD-10-CM | POA: Diagnosis not present

## 2021-09-02 DIAGNOSIS — N400 Enlarged prostate without lower urinary tract symptoms: Secondary | ICD-10-CM | POA: Diagnosis not present

## 2021-09-11 DIAGNOSIS — M170 Bilateral primary osteoarthritis of knee: Secondary | ICD-10-CM | POA: Diagnosis not present

## 2021-10-27 DIAGNOSIS — L821 Other seborrheic keratosis: Secondary | ICD-10-CM | POA: Diagnosis not present

## 2021-10-27 DIAGNOSIS — C4441 Basal cell carcinoma of skin of scalp and neck: Secondary | ICD-10-CM | POA: Diagnosis not present

## 2021-10-27 DIAGNOSIS — X32XXXD Exposure to sunlight, subsequent encounter: Secondary | ICD-10-CM | POA: Diagnosis not present

## 2021-10-27 DIAGNOSIS — L570 Actinic keratosis: Secondary | ICD-10-CM | POA: Diagnosis not present

## 2021-11-16 DIAGNOSIS — R69 Illness, unspecified: Secondary | ICD-10-CM | POA: Diagnosis not present

## 2021-11-16 DIAGNOSIS — F439 Reaction to severe stress, unspecified: Secondary | ICD-10-CM | POA: Diagnosis not present

## 2021-12-08 DIAGNOSIS — L821 Other seborrheic keratosis: Secondary | ICD-10-CM | POA: Diagnosis not present

## 2021-12-08 DIAGNOSIS — L570 Actinic keratosis: Secondary | ICD-10-CM | POA: Diagnosis not present

## 2021-12-08 DIAGNOSIS — D225 Melanocytic nevi of trunk: Secondary | ICD-10-CM | POA: Diagnosis not present

## 2021-12-08 DIAGNOSIS — Z85820 Personal history of malignant melanoma of skin: Secondary | ICD-10-CM | POA: Diagnosis not present

## 2021-12-08 DIAGNOSIS — X32XXXD Exposure to sunlight, subsequent encounter: Secondary | ICD-10-CM | POA: Diagnosis not present

## 2021-12-08 DIAGNOSIS — Z08 Encounter for follow-up examination after completed treatment for malignant neoplasm: Secondary | ICD-10-CM | POA: Diagnosis not present

## 2021-12-08 DIAGNOSIS — Z85828 Personal history of other malignant neoplasm of skin: Secondary | ICD-10-CM | POA: Diagnosis not present

## 2022-01-05 DIAGNOSIS — I10 Essential (primary) hypertension: Secondary | ICD-10-CM | POA: Diagnosis not present

## 2022-01-07 NOTE — Progress Notes (Deleted)
HPI  M never smoker followed for OSA, insomnia, rhinitis, complicated by CAD/CABG NPSG 10/14/2002: AHI of 15.9 per hour; CPAP titrated to 8 CWP   body weight 180 pounds NPSG 07/30/12- AHI 6.7 / hr  desat <88% for 37.3 minutes without CPAP  -------------------------------------------------------------------------  01/08/21-  88 year old male never smoker followed for OSA, Insomnia, rhinitis, complicated by CAD/CABG, HTN, GERD, BPH, thrombocytopenia, Pancreatitis,  Albuterol hfa, Breo 100,  CPAP auto 5-15/ [employer]- replaced in 2018 Download- compliance 90%, AHI 1/ hr Body weight today-188 lbs Covid vax-3 Phizer Flu vax-had ED 11/28/20- SOB- signed out AMA Christmas Eve Reports Breo and rescue inhaler help his breathing. Insomnia still an issue. Belsomra was too expensive. He settled on Trazodone 25 mg (more gives "crazy dreams") with !/2 Halcion. CPAP ok- some dry mouth.   CXR 1V- 11/27/20- IMPRESSION: 1. Evidence of prior median sternotomy/CABG without acute or active cardiopulmonary disease.  01/08/22- 88 year old male never smoker followed for OSA, Insomnia, rhinitis, complicated by CAD/CABG, HTN, GERD, BPH, thrombocytopenia, Pancreatitis,  -Albuterol hfa, Breo 100, Halcion 0.25,  CPAP auto 5-15/ [employer]- replaced in 2018 Download- compliance  Body weight today- Covid vax-3 Phizer Flu vax-  ROS-see HPI + = positive Constitutional:   No-   weight loss, night sweats, fevers, chills, +fatigue, lassitude. HEENT:   No-  headaches, difficulty swallowing, tooth/dental problems, sore throat,       No-  sneezing, itching, ear ache, +nasal congestion, post nasal drip,  CV:  No-   chest pain, orthopnea, PND, +swelling in lower extremities, No-anasarca, dizziness, palpitations Resp: +  shortness of breath with exertion or at rest.              No-   productive cough,  No non-productive cough,  No- coughing up of blood.              No-   change in color of mucus.+ wheezing.   Skin: No-   rash or  lesions. GI:  No-   heartburn, indigestion, abdominal pain, nausea, vomiting,  GU: n. MS:  No-   joint pain or swelling.   Neuro-     nothing unusual Psych:  No- change in mood or affect. No depression or anxiety.  No memory loss.  OBJ- Physical Exam General- Alert, Oriented, Affect-appropriate, Distress- none acute,  Skin- rash-none, lesions- none, excoriation- none Lymphadenopathy- none Head- atraumatic            Eyes- Gross vision intact, PERRLA, conjunctivae and secretions clear            Ears- Hearing, canals-normal            Nose- Clear, no-Septal dev, mucus, polyps, erosion, perforation             Throat- Mallampati III-IV , mucosa clear , drainage- none, tonsils- atrophic Neck- flexible , trachea midline, no stridor , thyroid nl, carotid no bruit Chest - symmetrical excursion , unlabored           Heart/CV- RRR , no murmur , no gallop  , no rub, nl s1 s2                           - JVD- none , edema+1, stasis changes- none, varices- none           Lung- clear to P&A, wheeze- none, cough- none , dullness-none, rub- none           Chest wall-  Abd-  Br/ Gen/ Rectal- Not done, not indicated Extrem- cyanosis- none, clubbing, none, atrophy- none, strength- nl Neuro- grossly intact to observation

## 2022-01-08 ENCOUNTER — Ambulatory Visit: Payer: Medicare HMO | Admitting: Internal Medicine

## 2022-01-14 NOTE — Progress Notes (Unsigned)
CARDIOLOGY OFFICE NOTE  Date:  01/14/2022    Scott [name] Date of Birth: 31-Jul-1928 Medical Record #[id number]  PCP:  Kirby Funk, MD  Cardiologist:  Jakyla Reza [place] MD  No chief complaint on file.   History of Present Illness: Scott [name] is a 88 y.o. male who presents today for follow up CAD and edema.  He has known CAD with remote CABG back in 2007 by  Dr. Tyrone Sage. Negative stress Myoview in 2010. Other issues include  HTN, HLD and OSA. Last echo in 2013.    Last year we performed evaluation with lab work including Chemistries, TSH, CBC,UA,  and BNP which were all normal except chronically low platelets. LE dopplers were negative for DVT. Echo was fairly unremarkable with gr 2 diastolic dysfunction and mild pulmonary HTN. EF was normal. He has been using lasix prn with improvement in edema and denies current SOB.   On follow up today he notes he has had a "cold" since Sunday with nonproductive cough and runny nose. No fever or chills. No chest pain or myalgias. No known exposure to Covid. States today he has felt the best but still coughing at night.   Past Medical History:  Diagnosis Date   Anemia    Acute blood loss anemia secondary to surgery, followed fr/ Hip replacement    Anxiety    pt. reports that he has occas. nervous episodes    BPH (benign prostatic hyperplasia)    CAD (coronary artery disease)    s/p CABG in 2007  CARDILOLOGIST IS DR. P. [place]   Cancer (HCC)    melanoma - removed L ear - 2010   Cranial neuropathy    resolved    Generalized OA    GERD (gastroesophageal reflux disease)    H/O echocardiogram    last echo, stress test 05/2012- pt. followed by [facility]    Hypercholesterolemia    Hypertension    Obesity    Osteoarthritis    End-stage osteoarthritis, right hip   Sleep apnea    with use of CPAP   SOB (shortness of breath)    WITH EXERTION   Thrombocytopenia (HCC)    possibly due to Lovenox    Past Surgical History:  Procedure  Laterality Date   ACHILLES TENDON SURGERY  10/06/2012   Procedure: ACHILLES TENDON REPAIR;  Surgeon: Thera Flake., MD;  Location: [facility] OR;  Service: Orthopedics;  Laterality: Right;  REPAIR RIGHT RUPTURE ACHILLES TENDON PRIMARY OPEN/PERCUTANEOUS, EXCISION PARTIAL BONE TALUS/CANCANEUS, REPAIR PARTIAL EXCISION CALCANEUS   APPENDECTOMY     ARTERY BIOPSY     temporal - L side- wnl, double vision treated /w prednisone - 1991   CALCANEAL OSTEOTOMY  10/06/2012   Procedure: CALCANEAL OSTEOTOMY;  Surgeon: Thera Flake., MD;  Location: [facility] OR;  Service: Orthopedics;  Laterality: Right;   CARDIAC CATHETERIZATION  12/15/2005   NORMAL. EF 60%, angioplasty 1995   carpel tunnel surgery-bilateral     both hands    CORONARY ARTERY BYPASS GRAFT  2007   LIMA GRAFT TO THE LAD, SAPHENOUS VEIN GRAFT TO THE FIRST OBTUSE MARIGNAL, SAPHENOUS VEIN GRAFT TO THE SECOND OBTUSE MARGINAL AND A SAPHENOUS VEIN GRAFT TO THE ACUTE MARGINAL AND POSTERIOR LATERAL BRANCH   HERNIA REPAIR     I & D EXTREMITY  12/11/2012   Procedure: IRRIGATION AND DEBRIDEMENT EXTREMITY;  Surgeon: Nadara Mustard, MD;  Location: [facility] OR;  Service: Orthopedics;  Laterality: Right;  Irrigation and Debridement achilles, wound closure,  apply wound vac, place antibiotic beads   INGUINAL HERNIA REPAIR Left 05/10/2013   Procedure: HERNIA REPAIR INGUINAL ADULT;  Surgeon: Velora Heckler, MD;  Location: [facility];  Service: General;  Laterality: Left;   INSERTION OF MESH Left 05/10/2013   Procedure: INSERTION OF MESH;  Surgeon: Velora Heckler, MD;  Location: [facility];  Service: General;  Laterality: Left;   JOINT REPLACEMENT     knee  11/12   left arthroscopic   TOTAL HIP ARTHROPLASTY     right     Medications: Current Outpatient Medications  Medication Sig Dispense Refill   albuterol (VENTOLIN HFA) 108 (90 Base) MCG/ACT inhaler Inhale 1-2 puffs into the lungs every 6 (six) hours as needed for wheezing or shortness of breath. 1 each 0   aspirin 81 MG tablet Take 81 mg  by mouth every evening.     BREO ELLIPTA 100-25 MCG/INH AEPB Inhale 1 puff into the lungs daily.     EPINEPHrine 0.3 mg/0.3 mL IJ SOAJ injection Inject 0.3 mg into the muscle as needed for anaphylaxis. 1 each 0   finasteride (PROSCAR) 5 MG tablet Take 5 mg by mouth daily before breakfast.     fish oil-omega-3 fatty acids 1000 MG capsule Take 1 g by mouth every other day.     furosemide (LASIX) 20 MG tablet TAKE 1 TABLET BY MOUTH ONCE EACH MORNING AS NEEDED FOR SWELLING/EDEMA     hydrochlorothiazide (HYDRODIURIL) 12.5 MG tablet Take 12.5 mg by mouth every morning.     Influenza Vac High-Dose Quad (FLUZONE HIGH-DOSE QUADRIVALENT) 0.7 ML SUSY Fluzone High-Dose Quad 2020-21 (PF) 240 mcg/0.7 mL IM syringe  PHARMACY ADMINISTERED     lovastatin (MEVACOR) 20 MG tablet Take 20 mg by mouth at bedtime.     methylPREDNISolone (MEDROL DOSEPAK) 4 MG TBPK tablet As directed 21 tablet 0   metoprolol (LOPRESSOR) 50 MG tablet Take 25 mg by mouth 2 (two) times daily.     potassium chloride (K-DUR,KLOR-CON) 10 MEQ tablet Take 10 mEq by mouth every evening.      potassium chloride (KLOR-CON) 10 MEQ tablet potassium chloride ER 10 mEq tablet,extended release  TK 1 T PO QD     Tamsulosin HCl (FLOMAX) 0.4 MG CAPS Take 0.4 mg by mouth daily after supper.     telmisartan (MICARDIS) 40 MG tablet Take 40 mg by mouth daily.     traZODone (DESYREL) 50 MG tablet Take 1 tablet (50 mg total) by mouth at bedtime. 30 tablet prn   triazolam (HALCION) 0.25 MG tablet TAKE 1 TABLET (0.25 MG TOTAL) BY MOUTH AT BEDTIME AS NEEDED FOR SLEEP. 30 tablet 5   vitamin E 400 UNIT capsule Take 400 Units by mouth daily.      No current facility-administered medications for this visit.    Allergies: Allergies  Allergen Reactions   Acetaminophen     Other reaction(s): mouth sores   Amlodipine Swelling    Swelling of feet and legs   Bactrim [Sulfamethoxazole-Trimethoprim] Other (See Comments)    Constipation    Codeine Nausea Only    Fexofenadine     Other reaction(s): mouth sores   Ibuprofen     Other reaction(s): nervousness   Lovenox [Enoxaparin Sodium] Other (See Comments)    MAY HAVE CAUSED THROMBOCYTOPENIA   Naproxen     Other reaction(s): mouth sores   Prednisone Swelling    Patient has tolerated doses for colds. Thinks it caused swelling in the past   Shellfish Allergy Nausea  And Vomiting and Other (See Comments)    N&V, shaking   Sonata [Zaleplon] Other (See Comments)    Hyper feelings    Social History: The patient  reports that he has never smoked. He has never used smokeless tobacco. He reports that he does not drink alcohol and does not use drugs.   Family History: The patient's family history includes Heart disease in his brother and brother; Prostate cancer in his brother.   Review of Systems: Please see the history of present illness.     All other systems are reviewed and negative.   Physical Exam: VS:  There were no vitals taken for this visit. Marland Kitchen  BMI There is no height or weight on file to calculate BMI.  Wt Readings from Last 3 Encounters:  01/08/21 188 lb 9.6 oz (85.5 kg)  11/27/20 189 lb 9.5 oz (86 kg)  11/13/20 188 lb 12.8 oz (85.6 kg)   GENERAL:  Elderly  WM in NAD HEENT:  PERRL, EOMI, sclera are clear. Oropharynx is clear. NECK:  No jugular venous distention, carotid upstroke brisk and symmetric, no bruits, no thyromegaly or adenopathy LUNGS:  Bilateral scattered wheezing.  CHEST:  Unremarkable HEART:  RRR,  PMI not displaced or sustained,S1 and S2 within normal limits, no S3, no S4: no clicks, no rubs, no murmurs ABD:  Soft, nontender. BS +, no masses or bruits. No hepatomegaly, no splenomegaly EXT:  2 + pulses throughout, 1-2+  pretibial  Edema with stasis dermatitis.  no cyanosis no clubbing SKIN:  Warm and dry.  No rashes NEURO:  Alert and oriented x 3. Cranial nerves II through XII intact. PSYCH:  Cognitively intact   LABORATORY DATA:  EKG:  EKG is  Not ordered today.      Lab Results  Component Value Date   WBC 10.7 (H) 11/27/2020   HGB 13.7 11/27/2020   HCT 41.9 11/27/2020   PLT 124 (L) 11/27/2020   GLUCOSE 162 (H) 11/27/2020   CHOL 152 05/17/2011   TRIG 56 04/17/2019   HDL 40.30 05/17/2011   LDLCALC 82 05/17/2011   ALT 15 11/27/2020   AST 23 11/27/2020   NA 128 (L) 11/27/2020   K 3.4 (L) 11/27/2020   CL 91 (L) 11/27/2020   CREATININE 0.82 11/27/2020   BUN 12 11/27/2020   CO2 25 11/27/2020   TSH 1.750 09/05/2019   INR 0.97 12/11/2012    BNP (last 3 results) No results for input(s): BNP in the last 8760 hours.  ProBNP (last 3 results) No results for input(s): PROBNP in the last 8760 hours.   Other Studies Reviewed Today:  Labs from primary care: 01/07/16: cholesterol 141, triglycerides 144, LDL 65, HDL 47.  09/10/16: BMET normal. Dated 11/16/17: cholesterol 161, triglycerides 134, HDL 51, LDL 84. BMET normal Dated 11/21/18: cholesterol 160, triglycerides 182, HDL 54, LDL 70. Potassium 3.3. creatinine normal. Dated 12/13/19: cholesterol 142, triglycerides 159, HDL 49, LDL 66. Dated 01/16/20: normal CBC and CMET  Echo 09/10/19: IMPRESSIONS      1. Left ventricular ejection fraction, by visual estimation, is 60 to 65%. The left ventricle has normal function. Normal left ventricular size. There is mildly increased left ventricular hypertrophy.  2. Left ventricular diastolic Doppler parameters are consistent with impaired relaxation pattern of LV diastolic filling.  3. Global right ventricle has normal systolic function.The right ventricular size is mildly enlarged. No increase in right ventricular wall thickness.  4. Left atrial size was normal.  5. Right atrial size was normal.  6. The mitral valve is normal in structure. Mild mitral valve regurgitation. No evidence of mitral stenosis.  7. The tricuspid valve is normal in structure. Tricuspid valve regurgitation is trivial.  8. The aortic valve is normal in structure. Aortic valve  regurgitation was not visualized by color flow Doppler. Structurally normal aortic valve, with no evidence of sclerosis or stenosis.  9. The pulmonic valve was normal in structure. Pulmonic valve regurgitation is trivial by color flow Doppler. 10. There is mild dilatation of the aortic root measuring 42 mm. 11. Mildly elevated pulmonary artery systolic pressure. 12. The tricuspid regurgitant velocity is 2.71 m/s, and with an assumed right atrial pressure of 8 mmHg, the estimated right ventricular systolic pressure is mildly elevated at 37.4 mmHg. 13. The inferior vena cava is normal in size with greater than 50% respiratory variability, suggesting right atrial pressure of 3 mmHg.   In comparison to the previous echocardiogram(s): 05/23/12 EF 55-65%. PA pressure .  Assessment/Plan: 1.  CAD with prior CABG back in 2007 - he remains asymptomatic. Continue medical therapy with ASA, beta blocker and statin.  2. Viral URI. Symptoms appear to be improving. Recommend hydration and cough suppressant/expectorant. No indication for antibiotics. Recommend if symptoms persist that he get tested for Covid.   3. HLD - on statin therapy. Plan labs with Dr Valentina Lucks next month  4. HTN controlled.   5. Bronchospastic lung disease   Disposition:   Follow up  in one year  Signed: Alynn Ellithorpe [place] MD, [facility]  01/14/2022 10:03 AM  [facility]

## 2022-01-19 ENCOUNTER — Ambulatory Visit: Payer: Medicare HMO | Admitting: Cardiology

## 2022-01-20 DIAGNOSIS — M25562 Pain in left knee: Secondary | ICD-10-CM | POA: Diagnosis not present

## 2022-01-20 DIAGNOSIS — M1711 Unilateral primary osteoarthritis, right knee: Secondary | ICD-10-CM | POA: Diagnosis not present

## 2022-01-20 DIAGNOSIS — M170 Bilateral primary osteoarthritis of knee: Secondary | ICD-10-CM | POA: Diagnosis not present

## 2022-01-20 DIAGNOSIS — M1712 Unilateral primary osteoarthritis, left knee: Secondary | ICD-10-CM | POA: Diagnosis not present

## 2022-01-22 NOTE — Progress Notes (Signed)
CARDIOLOGY OFFICE NOTE  Date:  01/26/2022    Scott [name] Date of Birth: [birth year] Medical Record #[id number]  PCP:  Kirby Funk, MD  Cardiologist:  Chastelyn Athens [place] MD  Chief Complaint  Patient presents with   Coronary Artery Disease    History of Present Illness: Scott [name] is a 88 y.o. male who presents today for follow up CAD and edema.  He has known CAD with remote CABG back in 2007 by  Dr. Tyrone Sage. Negative stress Myoview in 2010. Other issues include  HTN, HLD and OSA. Last echo in 2020.  In 2020 evaluation for edema with lab work including Chemistries, TSH, CBC,UA,  and BNP which were all normal except chronically low platelets. LE dopplers were negative for DVT. Echo was fairly unremarkable with gr 2 diastolic dysfunction and mild pulmonary HTN. EF was normal. He has been using lasix prn with improvement in edema and denies current SOB.   Followed by Dr Maple Hudson for OSA with CPAP.  On follow up today he is doing well. No chest pain or dyspnea. Still gets around really well. Does note some itching. No swelling except mild in left ankle. Takes lasix every once and awhile.     Past Medical History:  Diagnosis Date   Anemia    Acute blood loss anemia secondary to surgery, followed fr/ Hip replacement    Anxiety    pt. reports that he has occas. nervous episodes    BPH (benign prostatic hyperplasia)    CAD (coronary artery disease)    s/p CABG in 2007  CARDILOLOGIST IS DR. P. [place]   Cancer (HCC)    melanoma - removed L ear - 2010   Cranial neuropathy    resolved    Generalized OA    GERD (gastroesophageal reflux disease)    H/O echocardiogram    last echo, stress test 05/2012- pt. followed by     Hypercholesterolemia    Hypertension    Obesity    Osteoarthritis    End-stage osteoarthritis, right hip   Sleep apnea    with use of CPAP   SOB (shortness of breath)    WITH EXERTION   Thrombocytopenia (HCC)    possibly due to Lovenox    Past  Surgical History:  Procedure Laterality Date   ACHILLES TENDON SURGERY  10/06/2012   Procedure: ACHILLES TENDON REPAIR;  Surgeon: Thera Flake., MD;  Location: [facility] OR;  Service: Orthopedics;  Laterality: Right;  REPAIR RIGHT RUPTURE ACHILLES TENDON PRIMARY OPEN/PERCUTANEOUS, EXCISION PARTIAL BONE TALUS/CANCANEUS, REPAIR PARTIAL EXCISION CALCANEUS   APPENDECTOMY     ARTERY BIOPSY     temporal - L side- wnl, double vision treated /w prednisone - 1991   CALCANEAL OSTEOTOMY  10/06/2012   Procedure: CALCANEAL OSTEOTOMY;  Surgeon: Thera Flake., MD;  Location: [facility] OR;  Service: Orthopedics;  Laterality: Right;   CARDIAC CATHETERIZATION  12/15/2005   NORMAL. EF 60%, angioplasty 1995   carpel tunnel surgery-bilateral     both hands    CORONARY ARTERY BYPASS GRAFT  2007   LIMA GRAFT TO THE LAD, SAPHENOUS VEIN GRAFT TO THE FIRST OBTUSE MARIGNAL, SAPHENOUS VEIN GRAFT TO THE SECOND OBTUSE MARGINAL AND A SAPHENOUS VEIN GRAFT TO THE ACUTE MARGINAL AND POSTERIOR LATERAL BRANCH   HERNIA REPAIR     I & D EXTREMITY  12/11/2012   Procedure: IRRIGATION AND DEBRIDEMENT EXTREMITY;  Surgeon: Nadara Mustard, MD;  Location: [facility] OR;  Service: Orthopedics;  Laterality: Right;  Irrigation and Debridement achilles, wound closure, apply wound vac, place antibiotic beads   INGUINAL HERNIA REPAIR Left 05/10/2013   Procedure: HERNIA REPAIR INGUINAL ADULT;  Surgeon: Velora Heckler, MD;  Location: [facility];  Service: General;  Laterality: Left;   INSERTION OF MESH Left 05/10/2013   Procedure: INSERTION OF MESH;  Surgeon: Velora Heckler, MD;  Location: [facility];  Service: General;  Laterality: Left;   JOINT REPLACEMENT     knee  11/12   left arthroscopic   TOTAL HIP ARTHROPLASTY     right     Medications: Current Outpatient Medications  Medication Sig Dispense Refill   albuterol (VENTOLIN HFA) 108 (90 Base) MCG/ACT inhaler Inhale 1-2 puffs into the lungs every 6 (six) hours as needed for wheezing or shortness of breath. 1 each 0    aspirin 81 MG tablet Take 81 mg by mouth every evening.     BREO ELLIPTA 100-25 MCG/INH AEPB Inhale 1 puff into the lungs daily.     EPINEPHrine 0.3 mg/0.3 mL IJ SOAJ injection Inject 0.3 mg into the muscle as needed for anaphylaxis. 1 each 0   finasteride (PROSCAR) 5 MG tablet Take 5 mg by mouth daily before breakfast. Takes 3 tablets a week.     fish oil-omega-3 fatty acids 1000 MG capsule Take 1 g by mouth every other day.     furosemide (LASIX) 20 MG tablet TAKE 1 TABLET BY MOUTH ONCE EACH MORNING AS NEEDED FOR SWELLING/EDEMA     hydrochlorothiazide (HYDRODIURIL) 12.5 MG tablet Take 12.5 mg by mouth every morning.     Influenza Vac High-Dose Quad (FLUZONE HIGH-DOSE QUADRIVALENT) 0.7 ML SUSY Fluzone High-Dose Quad 2020-21 (PF) 240 mcg/0.7 mL IM syringe  PHARMACY ADMINISTERED     lovastatin (MEVACOR) 20 MG tablet Take 20 mg by mouth at bedtime.     methylPREDNISolone (MEDROL DOSEPAK) 4 MG TBPK tablet As directed 21 tablet 0   metoprolol (LOPRESSOR) 50 MG tablet Take 25 mg by mouth 2 (two) times daily.     potassium chloride (K-DUR,KLOR-CON) 10 MEQ tablet Take 10 mEq by mouth every evening.      potassium chloride (KLOR-CON) 10 MEQ tablet potassium chloride ER 10 mEq tablet,extended release  TK 1 T PO QD     telmisartan (MICARDIS) 40 MG tablet Take 40 mg by mouth daily.     traZODone (DESYREL) 50 MG tablet Take 1 tablet (50 mg total) by mouth at bedtime. 30 tablet prn   triazolam (HALCION) 0.25 MG tablet TAKE 1 TABLET (0.25 MG TOTAL) BY MOUTH AT BEDTIME AS NEEDED FOR SLEEP. 30 tablet 5   vitamin E 400 UNIT capsule Take 400 Units by mouth daily.      No current facility-administered medications for this visit.    Allergies: Allergies  Allergen Reactions   Acetaminophen     Other reaction(s): mouth sores   Amlodipine Swelling    Swelling of feet and legs   Bactrim [Sulfamethoxazole-Trimethoprim] Other (See Comments)    Constipation    Codeine Nausea Only   Fexofenadine     Other  reaction(s): mouth sores   Ibuprofen     Other reaction(s): nervousness   Lovenox [Enoxaparin Sodium] Other (See Comments)    MAY HAVE CAUSED THROMBOCYTOPENIA   Naproxen     Other reaction(s): mouth sores   Prednisone Swelling    Patient has tolerated doses for colds. Thinks it caused swelling in the past   Shellfish Allergy Nausea And Vomiting and Other (See Comments)  N&V, shaking   Sonata [Zaleplon] Other (See Comments)    Hyper feelings    Social History: The patient  reports that he has never smoked. He has never used smokeless tobacco. He reports that he does not drink alcohol and does not use drugs.   Family History: The patient's family history includes Heart disease in his brother and brother; Prostate cancer in his brother.   Review of Systems: Please see the history of present illness.     All other systems are reviewed and negative.   Physical Exam: VS:  BP (!) 150/60 (BP Location: Left Arm, Patient Position: Sitting)    Pulse 63    Ht 5\' 5"  (1.651 m)    Wt 189 lb (85.7 kg)    SpO2 94%    BMI 31.45 kg/m  .  BMI Body mass index is 31.45 kg/m.  Wt Readings from Last 3 Encounters:  01/26/22 189 lb (85.7 kg)  01/08/21 188 lb 9.6 oz (85.5 kg)  11/27/20 189 lb 9.5 oz (86 kg)   GENERAL:  Elderly  WM in NAD HEENT:  PERRL, EOMI, sclera are clear. Oropharynx is clear. NECK:  No jugular venous distention, carotid upstroke brisk and symmetric, no bruits, no thyromegaly or adenopathy LUNGS:  Bilateral scattered wheezing.  CHEST:  Unremarkable HEART:  RRR,  PMI not displaced or sustained,S1 and S2 within normal limits, no S3, no S4: no clicks, no rubs, no murmurs ABD:  Soft, nontender. BS +, no masses or bruits. No hepatomegaly, no splenomegaly EXT:  2 + pulses throughout, 1+  pretibial on left.  Edema with stasis dermatitis.  no cyanosis no clubbing SKIN:  Warm and dry.  No rashes NEURO:  Alert and oriented x 3. Cranial nerves II through XII intact. PSYCH:  Cognitively  intact   LABORATORY DATA:  EKG:  EKG is   ordered today. NSR rate 63. First degree AV block. I have personally reviewed and interpreted this study.  Lab Results  Component Value Date   WBC 10.7 (H) 11/27/2020   HGB 13.7 11/27/2020   HCT 41.9 11/27/2020   PLT 124 (L) 11/27/2020   GLUCOSE 162 (H) 11/27/2020   CHOL 152 05/17/2011   TRIG 56 04/17/2019   HDL 40.30 05/17/2011   LDLCALC 82 05/17/2011   ALT 15 11/27/2020   AST 23 11/27/2020   NA 128 (L) 11/27/2020   K 3.4 (L) 11/27/2020   CL 91 (L) 11/27/2020   CREATININE 0.82 11/27/2020   BUN 12 11/27/2020   CO2 25 11/27/2020   TSH 1.750 09/05/2019   INR 0.97 12/11/2012    BNP (last 3 results) No results for input(s): BNP in the last 8760 hours.  ProBNP (last 3 results) No results for input(s): PROBNP in the last 8760 hours.   Other Studies Reviewed Today:  Labs from primary care: 01/07/16: cholesterol 141, triglycerides 144, LDL 65, HDL 47.  09/10/16: BMET normal. Dated 11/16/17: cholesterol 161, triglycerides 134, HDL 51, LDL 84. BMET normal Dated 11/21/18: cholesterol 160, triglycerides 182, HDL 54, LDL 70. Potassium 3.3. creatinine normal. Dated 12/13/19: cholesterol 142, triglycerides 159, HDL 49, LDL 66. Dated 01/16/20: normal CBC and CMET Dated 12/10/20: normal Hgb and BMET  Echo 09/10/19: IMPRESSIONS      1. Left ventricular ejection fraction, by visual estimation, is 60 to 65%. The left ventricle has normal function. Normal left ventricular size. There is mildly increased left ventricular hypertrophy.  2. Left ventricular diastolic Doppler parameters are consistent with impaired relaxation pattern of  LV diastolic filling.  3. Global right ventricle has normal systolic function.The right ventricular size is mildly enlarged. No increase in right ventricular wall thickness.  4. Left atrial size was normal.  5. Right atrial size was normal.  6. The mitral valve is normal in structure. Mild mitral valve regurgitation. No  evidence of mitral stenosis.  7. The tricuspid valve is normal in structure. Tricuspid valve regurgitation is trivial.  8. The aortic valve is normal in structure. Aortic valve regurgitation was not visualized by color flow Doppler. Structurally normal aortic valve, with no evidence of sclerosis or stenosis.  9. The pulmonic valve was normal in structure. Pulmonic valve regurgitation is trivial by color flow Doppler. 10. There is mild dilatation of the aortic root measuring 42 mm. 11. Mildly elevated pulmonary artery systolic pressure. 12. The tricuspid regurgitant velocity is 2.71 m/s, and with an assumed right atrial pressure of 8 mmHg, the estimated right ventricular systolic pressure is mildly elevated at 37.4 mmHg. 13. The inferior vena cava is normal in size with greater than 50% respiratory variability, suggesting right atrial pressure of 3 mmHg.   In comparison to the previous echocardiogram(s): 05/23/12 EF 55-65%. PA pressure .  Assessment/Plan: 1.  CAD with prior CABG back in 2007 - he remains asymptomatic. Continue medical therapy with ASA, beta blocker and statin.  2. HLD - on statin therapy. Plan labs with Dr Valentina Lucks- seeing him tomorrow.   3. HTN controlled.    Disposition:   Follow up  in one year  Signed: Robbye Dede [place] MD, [facility]  01/26/2022 2:03 PM  [facility]

## 2022-01-26 ENCOUNTER — Encounter: Payer: Self-pay | Admitting: Cardiology

## 2022-01-26 ENCOUNTER — Ambulatory Visit: Payer: Medicare HMO | Admitting: Cardiology

## 2022-01-26 ENCOUNTER — Other Ambulatory Visit: Payer: Self-pay

## 2022-01-26 VITALS — BP 150/60 | HR 63 | Ht 65.0 in | Wt 189.0 lb

## 2022-01-26 DIAGNOSIS — E7800 Pure hypercholesterolemia, unspecified: Secondary | ICD-10-CM | POA: Diagnosis not present

## 2022-01-26 DIAGNOSIS — I10 Essential (primary) hypertension: Secondary | ICD-10-CM | POA: Diagnosis not present

## 2022-01-26 DIAGNOSIS — I25810 Atherosclerosis of coronary artery bypass graft(s) without angina pectoris: Secondary | ICD-10-CM

## 2022-01-27 DIAGNOSIS — Z1389 Encounter for screening for other disorder: Secondary | ICD-10-CM | POA: Diagnosis not present

## 2022-01-27 DIAGNOSIS — I2510 Atherosclerotic heart disease of native coronary artery without angina pectoris: Secondary | ICD-10-CM | POA: Diagnosis not present

## 2022-01-27 DIAGNOSIS — I872 Venous insufficiency (chronic) (peripheral): Secondary | ICD-10-CM | POA: Diagnosis not present

## 2022-01-27 DIAGNOSIS — N400 Enlarged prostate without lower urinary tract symptoms: Secondary | ICD-10-CM | POA: Diagnosis not present

## 2022-01-27 DIAGNOSIS — J4520 Mild intermittent asthma, uncomplicated: Secondary | ICD-10-CM | POA: Diagnosis not present

## 2022-01-27 DIAGNOSIS — D696 Thrombocytopenia, unspecified: Secondary | ICD-10-CM | POA: Diagnosis not present

## 2022-01-27 DIAGNOSIS — E7800 Pure hypercholesterolemia, unspecified: Secondary | ICD-10-CM | POA: Diagnosis not present

## 2022-01-27 DIAGNOSIS — I700 Atherosclerosis of aorta: Secondary | ICD-10-CM | POA: Diagnosis not present

## 2022-01-27 DIAGNOSIS — Z0000 Encounter for general adult medical examination without abnormal findings: Secondary | ICD-10-CM | POA: Diagnosis not present

## 2022-01-27 DIAGNOSIS — I10 Essential (primary) hypertension: Secondary | ICD-10-CM | POA: Diagnosis not present

## 2022-01-27 DIAGNOSIS — R69 Illness, unspecified: Secondary | ICD-10-CM | POA: Diagnosis not present

## 2022-02-02 DIAGNOSIS — I10 Essential (primary) hypertension: Secondary | ICD-10-CM | POA: Diagnosis not present

## 2022-02-26 DIAGNOSIS — E7800 Pure hypercholesterolemia, unspecified: Secondary | ICD-10-CM | POA: Diagnosis not present

## 2022-02-26 DIAGNOSIS — I10 Essential (primary) hypertension: Secondary | ICD-10-CM | POA: Diagnosis not present

## 2022-03-02 ENCOUNTER — Ambulatory Visit: Payer: Medicare HMO | Admitting: Cardiology

## 2022-03-05 DIAGNOSIS — I10 Essential (primary) hypertension: Secondary | ICD-10-CM | POA: Diagnosis not present

## 2022-03-16 DIAGNOSIS — R32 Unspecified urinary incontinence: Secondary | ICD-10-CM | POA: Diagnosis not present

## 2022-03-16 DIAGNOSIS — R0602 Shortness of breath: Secondary | ICD-10-CM | POA: Diagnosis not present

## 2022-03-16 DIAGNOSIS — M1990 Unspecified osteoarthritis, unspecified site: Secondary | ICD-10-CM | POA: Diagnosis not present

## 2022-03-16 DIAGNOSIS — Z008 Encounter for other general examination: Secondary | ICD-10-CM | POA: Diagnosis not present

## 2022-03-16 DIAGNOSIS — R609 Edema, unspecified: Secondary | ICD-10-CM | POA: Diagnosis not present

## 2022-03-16 DIAGNOSIS — G629 Polyneuropathy, unspecified: Secondary | ICD-10-CM | POA: Diagnosis not present

## 2022-03-16 DIAGNOSIS — I739 Peripheral vascular disease, unspecified: Secondary | ICD-10-CM | POA: Diagnosis not present

## 2022-03-16 DIAGNOSIS — G4733 Obstructive sleep apnea (adult) (pediatric): Secondary | ICD-10-CM | POA: Diagnosis not present

## 2022-03-16 DIAGNOSIS — N529 Male erectile dysfunction, unspecified: Secondary | ICD-10-CM | POA: Diagnosis not present

## 2022-03-16 DIAGNOSIS — E785 Hyperlipidemia, unspecified: Secondary | ICD-10-CM | POA: Diagnosis not present

## 2022-03-16 DIAGNOSIS — R69 Illness, unspecified: Secondary | ICD-10-CM | POA: Diagnosis not present

## 2022-03-16 DIAGNOSIS — R062 Wheezing: Secondary | ICD-10-CM | POA: Diagnosis not present

## 2022-03-16 DIAGNOSIS — I10 Essential (primary) hypertension: Secondary | ICD-10-CM | POA: Diagnosis not present

## 2022-04-04 DIAGNOSIS — I10 Essential (primary) hypertension: Secondary | ICD-10-CM | POA: Diagnosis not present

## 2022-05-05 DIAGNOSIS — I10 Essential (primary) hypertension: Secondary | ICD-10-CM | POA: Diagnosis not present

## 2022-05-19 DIAGNOSIS — M79672 Pain in left foot: Secondary | ICD-10-CM | POA: Diagnosis not present

## 2022-05-19 DIAGNOSIS — M170 Bilateral primary osteoarthritis of knee: Secondary | ICD-10-CM | POA: Diagnosis not present

## 2022-06-04 DIAGNOSIS — I10 Essential (primary) hypertension: Secondary | ICD-10-CM | POA: Diagnosis not present

## 2022-06-15 DIAGNOSIS — Z08 Encounter for follow-up examination after completed treatment for malignant neoplasm: Secondary | ICD-10-CM | POA: Diagnosis not present

## 2022-06-15 DIAGNOSIS — X32XXXD Exposure to sunlight, subsequent encounter: Secondary | ICD-10-CM | POA: Diagnosis not present

## 2022-06-15 DIAGNOSIS — L570 Actinic keratosis: Secondary | ICD-10-CM | POA: Diagnosis not present

## 2022-06-15 DIAGNOSIS — D225 Melanocytic nevi of trunk: Secondary | ICD-10-CM | POA: Diagnosis not present

## 2022-06-15 DIAGNOSIS — Z85820 Personal history of malignant melanoma of skin: Secondary | ICD-10-CM | POA: Diagnosis not present

## 2022-06-15 DIAGNOSIS — L814 Other melanin hyperpigmentation: Secondary | ICD-10-CM | POA: Diagnosis not present

## 2022-06-15 DIAGNOSIS — L820 Inflamed seborrheic keratosis: Secondary | ICD-10-CM | POA: Diagnosis not present

## 2022-06-15 DIAGNOSIS — L821 Other seborrheic keratosis: Secondary | ICD-10-CM | POA: Diagnosis not present

## 2022-09-02 DIAGNOSIS — M25561 Pain in right knee: Secondary | ICD-10-CM | POA: Diagnosis not present

## 2022-09-02 DIAGNOSIS — M170 Bilateral primary osteoarthritis of knee: Secondary | ICD-10-CM | POA: Diagnosis not present

## 2022-09-02 DIAGNOSIS — M25562 Pain in left knee: Secondary | ICD-10-CM | POA: Diagnosis not present

## 2022-11-09 DIAGNOSIS — G4733 Obstructive sleep apnea (adult) (pediatric): Secondary | ICD-10-CM | POA: Diagnosis not present

## 2022-11-09 DIAGNOSIS — I700 Atherosclerosis of aorta: Secondary | ICD-10-CM | POA: Diagnosis not present

## 2022-11-09 DIAGNOSIS — R051 Acute cough: Secondary | ICD-10-CM | POA: Diagnosis not present

## 2022-11-09 DIAGNOSIS — R062 Wheezing: Secondary | ICD-10-CM | POA: Diagnosis not present

## 2022-11-09 DIAGNOSIS — E7800 Pure hypercholesterolemia, unspecified: Secondary | ICD-10-CM | POA: Diagnosis not present

## 2022-11-09 DIAGNOSIS — R21 Rash and other nonspecific skin eruption: Secondary | ICD-10-CM | POA: Diagnosis not present

## 2022-11-09 DIAGNOSIS — I10 Essential (primary) hypertension: Secondary | ICD-10-CM | POA: Diagnosis not present

## 2022-11-09 DIAGNOSIS — D696 Thrombocytopenia, unspecified: Secondary | ICD-10-CM | POA: Diagnosis not present

## 2022-11-09 DIAGNOSIS — I2510 Atherosclerotic heart disease of native coronary artery without angina pectoris: Secondary | ICD-10-CM | POA: Diagnosis not present

## 2022-11-09 DIAGNOSIS — M1990 Unspecified osteoarthritis, unspecified site: Secondary | ICD-10-CM | POA: Diagnosis not present

## 2022-11-09 DIAGNOSIS — R69 Illness, unspecified: Secondary | ICD-10-CM | POA: Diagnosis not present

## 2022-11-09 DIAGNOSIS — J4520 Mild intermittent asthma, uncomplicated: Secondary | ICD-10-CM | POA: Diagnosis not present

## 2022-11-26 DIAGNOSIS — I10 Essential (primary) hypertension: Secondary | ICD-10-CM | POA: Diagnosis not present

## 2022-11-26 DIAGNOSIS — E7800 Pure hypercholesterolemia, unspecified: Secondary | ICD-10-CM | POA: Diagnosis not present

## 2022-11-26 DIAGNOSIS — N400 Enlarged prostate without lower urinary tract symptoms: Secondary | ICD-10-CM | POA: Diagnosis not present

## 2022-11-26 DIAGNOSIS — I2510 Atherosclerotic heart disease of native coronary artery without angina pectoris: Secondary | ICD-10-CM | POA: Diagnosis not present

## 2022-12-14 DIAGNOSIS — D225 Melanocytic nevi of trunk: Secondary | ICD-10-CM | POA: Diagnosis not present

## 2022-12-14 DIAGNOSIS — D044 Carcinoma in situ of skin of scalp and neck: Secondary | ICD-10-CM | POA: Diagnosis not present

## 2022-12-14 DIAGNOSIS — X32XXXD Exposure to sunlight, subsequent encounter: Secondary | ICD-10-CM | POA: Diagnosis not present

## 2022-12-14 DIAGNOSIS — L821 Other seborrheic keratosis: Secondary | ICD-10-CM | POA: Diagnosis not present

## 2022-12-14 DIAGNOSIS — Z1283 Encounter for screening for malignant neoplasm of skin: Secondary | ICD-10-CM | POA: Diagnosis not present

## 2022-12-14 DIAGNOSIS — B86 Scabies: Secondary | ICD-10-CM | POA: Diagnosis not present

## 2022-12-14 DIAGNOSIS — L570 Actinic keratosis: Secondary | ICD-10-CM | POA: Diagnosis not present

## 2023-01-04 ENCOUNTER — Other Ambulatory Visit: Payer: Self-pay

## 2023-01-04 ENCOUNTER — Encounter (HOSPITAL_COMMUNITY): Payer: Self-pay

## 2023-01-04 ENCOUNTER — Emergency Department (HOSPITAL_COMMUNITY)
Admission: EM | Admit: 2023-01-04 | Discharge: 2023-01-04 | Disposition: A | Discharge status: Home / Self Care | Payer: Medicare HMO | Attending: Emergency Medicine | Admitting: Emergency Medicine

## 2023-01-04 ENCOUNTER — Emergency Department (HOSPITAL_COMMUNITY): Payer: Medicare HMO

## 2023-01-04 DIAGNOSIS — R42 Dizziness and giddiness: Secondary | ICD-10-CM | POA: Diagnosis not present

## 2023-01-04 DIAGNOSIS — Z951 Presence of aortocoronary bypass graft: Secondary | ICD-10-CM | POA: Diagnosis not present

## 2023-01-04 DIAGNOSIS — I10 Essential (primary) hypertension: Secondary | ICD-10-CM | POA: Insufficient documentation

## 2023-01-04 DIAGNOSIS — Z7982 Long term (current) use of aspirin: Secondary | ICD-10-CM | POA: Insufficient documentation

## 2023-01-04 DIAGNOSIS — Z79899 Other long term (current) drug therapy: Secondary | ICD-10-CM | POA: Diagnosis not present

## 2023-01-04 DIAGNOSIS — E876 Hypokalemia: Secondary | ICD-10-CM | POA: Diagnosis not present

## 2023-01-04 DIAGNOSIS — R262 Difficulty in walking, not elsewhere classified: Secondary | ICD-10-CM | POA: Diagnosis not present

## 2023-01-04 DIAGNOSIS — I2510 Atherosclerotic heart disease of native coronary artery without angina pectoris: Secondary | ICD-10-CM | POA: Diagnosis not present

## 2023-01-04 DIAGNOSIS — D696 Thrombocytopenia, unspecified: Secondary | ICD-10-CM | POA: Diagnosis not present

## 2023-01-04 DIAGNOSIS — Y900 Blood alcohol level of less than 20 mg/100 ml: Secondary | ICD-10-CM | POA: Diagnosis not present

## 2023-01-04 DIAGNOSIS — M4802 Spinal stenosis, cervical region: Secondary | ICD-10-CM | POA: Diagnosis not present

## 2023-01-04 DIAGNOSIS — R69 Illness, unspecified: Secondary | ICD-10-CM | POA: Diagnosis not present

## 2023-01-04 DIAGNOSIS — Z743 Need for continuous supervision: Secondary | ICD-10-CM | POA: Diagnosis not present

## 2023-01-04 LAB — COMPREHENSIVE METABOLIC PANEL
ALT: 18 U/L (ref 0–44)
AST: 24 U/L (ref 15–41)
Albumin: 3.6 g/dL (ref 3.5–5.0)
Alkaline Phosphatase: 69 U/L (ref 38–126)
Anion gap: 9 (ref 5–15)
BUN: 16 mg/dL (ref 8–23)
CO2: 28 mmol/L (ref 22–32)
Calcium: 9 mg/dL (ref 8.9–10.3)
Chloride: 102 mmol/L (ref 98–111)
Creatinine, Ser: 1.06 mg/dL (ref 0.61–1.24)
GFR, Estimated: >60 mL/min (ref >60)
Glucose, Bld: 91 mg/dL (ref 70–99)
Potassium: 3.4 mmol/L — ABNORMAL LOW (ref 3.5–5.1)
Sodium: 139 mmol/L (ref 135–145)
Total Bilirubin: 1.2 mg/dL (ref 0.3–1.2)
Total Protein: 6.4 g/dL — ABNORMAL LOW (ref 6.5–8.1)

## 2023-01-04 LAB — PROTIME-INR
INR: 1.1 (ref 0.8–1.2)
Prothrombin Time: 13.7 seconds (ref 11.4–15.2)

## 2023-01-04 LAB — CBC
HCT: 41.6 % (ref 39.0–52.0)
Hemoglobin: 13.7 g/dL (ref 13.0–17.0)
MCH: 32.8 pg (ref 26.0–34.0)
MCHC: 32.9 g/dL (ref 30.0–36.0)
MCV: 99.5 fL (ref 80.0–100.0)
Platelets: 134 10*3/uL — ABNORMAL LOW (ref 150–400)
RBC: 4.18 MIL/uL — ABNORMAL LOW (ref 4.22–5.81)
RDW: 13.4 % (ref 11.5–15.5)
WBC: 6.3 10*3/uL (ref 4.0–10.5)
nRBC: 0 % (ref 0.0–0.2)

## 2023-01-04 LAB — DIFFERENTIAL
Abs Immature Granulocytes: 0.04 10*3/uL (ref 0.00–0.07)
Basophils Absolute: 0.1 10*3/uL (ref 0.0–0.1)
Basophils Relative: 1 %
Eosinophils Absolute: 0.7 10*3/uL — ABNORMAL HIGH (ref 0.0–0.5)
Eosinophils Relative: 12 %
Immature Granulocytes: 1 %
Lymphocytes Relative: 25 %
Lymphs Abs: 1.6 10*3/uL (ref 0.7–4.0)
Monocytes Absolute: 0.7 10*3/uL (ref 0.1–1.0)
Monocytes Relative: 12 %
Neutro Abs: 3.2 10*3/uL (ref 1.7–7.7)
Neutrophils Relative %: 49 %

## 2023-01-04 LAB — ETHANOL: Alcohol, Ethyl (B): <10 mg/dL (ref <10)

## 2023-01-04 LAB — APTT: aPTT: 30 seconds (ref 24–36)

## 2023-01-04 MED ORDER — MECLIZINE HCL 25 MG PO TABS
12.5000 mg | ORAL_TABLET | Freq: Once | ORAL | Status: AC
  Administered 2023-01-04: 12.5 mg via ORAL
  Filled 2023-01-04: qty 1

## 2023-01-04 MED ORDER — MECLIZINE HCL 12.5 MG PO TABS: 12.5000 mg | ORAL_TABLET | Freq: Three times a day (TID) | ORAL | 0 refills | Status: DC | PRN

## 2023-01-04 NOTE — ED Provider Notes (Signed)
[facility] Provider Note   CSN: [id number] Arrival date & time: 01/04/23  0730     History  Chief Complaint  Patient presents with   Dizziness    Scott [name] is a 89 y.o. male.  HPI 89 year old male history of hypercholesterolemia, hypertension, thrombocytopenia, coronary artery disease status post CABG, BPH, today complaining of dizziness.  Patient states that last night when he went to bed at 945 he rolled from his left right side.  He became vertiginous.  He had difficulty maintaining his balance when he sat on the side of the bed.  He had to use his walker to get to the bathroom.  Symptoms persisted through the night.  This is a new onset of symptoms.  He denies any pain or injury.  He is on aspirin and is not on other blood thinners.  He has not had any previous past medical symptoms like this.  He denies lateralized weakness, difficulty speaking or seeing.     Home Medications Prior to Admission medications   Medication Sig Start Date End Date Taking? Authorizing Provider  albuterol (VENTOLIN HFA) 108 (90 Base) MCG/ACT inhaler Inhale 1-2 puffs into the lungs every 6 (six) hours as needed for wheezing or shortness of breath. 46/80/32   Vanna Scotland, MD  aspirin 81 MG tablet Take 81 mg by mouth every evening.    [provider]  BREO ELLIPTA 100-25 MCG/INH AEPB Inhale 1 puff into the lungs daily. 12/23/20   [provider]  EPINEPHrine 0.3 mg/0.3 mL IJ SOAJ injection Inject 0.3 mg into the muscle as needed for anaphylaxis. 11/28/81   Vanna Scotland, MD  finasteride (PROSCAR) 5 MG tablet Take 5 mg by mouth daily before breakfast. Takes 3 tablets a week.    [provider]  fish oil-omega-3 fatty acids 1000 MG capsule Take 1 g by mouth every other day.    [provider]  furosemide (LASIX) 20 MG tablet TAKE 1 TABLET BY MOUTH ONCE EACH MORNING AS NEEDED FOR SWELLING/EDEMA 08/26/19   [provider]  hydrochlorothiazide (HYDRODIURIL) 12.5 MG tablet Take 12.5 mg by mouth every morning. 12/13/19   [provider]  Influenza Vac High-Dose Quad (FLUZONE HIGH-DOSE QUADRIVALENT) 0.7 ML SUSY Fluzone High-Dose Quad 2020-21 (PF) 240 mcg/0.7 mL IM syringe  PHARMACY ADMINISTERED    [provider]  lovastatin (MEVACOR) 20 MG tablet Take 20 mg by mouth at bedtime.    [provider]  methylPREDNISolone (MEDROL DOSEPAK) 4 MG TBPK tablet As directed 05/16/20   Martinique, Peter M, MD  metoprolol (LOPRESSOR) 50 MG tablet Take 25 mg by mouth 2 (two) times daily.    [provider]  potassium chloride (K-DUR,KLOR-CON) 10 MEQ tablet Take 10 mEq by mouth every evening.     [provider]  potassium chloride (KLOR-CON) 10 MEQ tablet potassium chloride ER 10 mEq tablet,extended release  TK 1 T PO QD    [provider]  telmisartan (MICARDIS) 40 MG tablet Take 40 mg by mouth daily. 08/14/19   [provider]  traZODone (DESYREL) 50 MG tablet Take 1 tablet (50 mg total) by mouth at bedtime. 12/03/14   Young, Tarri Fuller D, MD  triazolam (HALCION) 0.25 MG tablet TAKE 1 TABLET (0.25 MG TOTAL) BY MOUTH AT BEDTIME AS NEEDED FOR SLEEP. 05/26/21   Baird Lyons D, MD  vitamin E 400 UNIT capsule Take 400 Units by mouth daily.     [provider]  Allergies    Acetaminophen, Amlodipine, Bactrim [sulfamethoxazole-trimethoprim], Codeine, Fexofenadine, Ibuprofen, Lovenox [enoxaparin sodium], Naproxen, Prednisone, Shellfish allergy, and Sonata [zaleplon]    Review of Systems   Review of Systems  Physical Exam Updated Vital Signs BP 139/69   Pulse 67   Temp 98 F (36.7 C) (Oral)   Resp 16   Ht 1.676 m ('5\' 6"'$ )   Wt 81.6 kg   SpO2 100%   BMI 29.05 kg/m  Physical Exam Vitals and nursing note reviewed.  Constitutional:      Appearance: Normal appearance.  HENT:     Head: Normocephalic.     Right Ear: External ear normal.     Left  Ear: External ear normal.     Nose: Nose normal.     Mouth/Throat:     Pharynx: Oropharynx is clear.  Eyes:     Extraocular Movements: Extraocular movements intact.     Pupils: Pupils are equal, round, and reactive to light.  Cardiovascular:     Rate and Rhythm: Normal rate and regular rhythm.     Pulses: Normal pulses.  Pulmonary:     Effort: Pulmonary effort is normal.  Abdominal:     General: Abdomen is flat.     Palpations: Abdomen is soft.  Musculoskeletal:        General: Normal range of motion.     Cervical back: Normal range of motion.  Skin:    General: Skin is warm.     Capillary Refill: Capillary refill takes less than 2 seconds.  Neurological:     General: No focal deficit present.     Mental Status: He is alert and oriented to person, place, and time.     Cranial Nerves: No cranial nerve deficit.     Motor: No weakness.     Coordination: Coordination normal.     Gait: Gait normal.     Deep Tendon Reflexes: Reflexes normal.  Psychiatric:        Mood and Affect: Mood normal.        Behavior: Behavior normal.     ED Results / Procedures / Treatments   Labs (all labs ordered are listed, but only abnormal results are displayed) Labs Reviewed  CBC - Abnormal; Notable for the following components:      Result Value   RBC 4.18 (*)    Platelets 134 (*)    All other components within normal limits  DIFFERENTIAL - Abnormal; Notable for the following components:   Eosinophils Absolute 0.7 (*)    All other components within normal limits  COMPREHENSIVE METABOLIC PANEL - Abnormal; Notable for the following components:   Potassium 3.4 (*)    Total Protein 6.4 (*)    All other components within normal limits  PROTIME-INR  APTT  ETHANOL  I-STAT CHEM 8, ED  CBG MONITORING, ED    EKG EKG Interpretation  Date/Time:  Tuesday January 04 2023 07:34:57 EST Ventricular Rate:  61 PR Interval:  332 QRS Duration: 82 QT Interval:  426 QTC Calculation: 428 R  Axis:   44 Text Interpretation: Sinus rhythm with 1st degree A-V block Otherwise normal ECG When compared with ECG of 27-Nov-2020 21:21, PREVIOUS ECG IS PRESENT No significant change since last tracing Confirmed by Pattricia Boss [phone]) on 01/04/2023 1:48:29 PM  Radiology MR BRAIN WO CONTRAST  Result Date: 01/04/2023 CLINICAL DATA:  Neuro deficit, stroke suspected. Vertigo, dizziness. EXAM: MRI HEAD WITHOUT CONTRAST TECHNIQUE: Multiplanar, multiecho pulse sequences of the brain and surrounding structures were obtained  without intravenous contrast. COMPARISON:  Same-day CT head FINDINGS: Brain: There is no acute intracranial hemorrhage, extra-axial fluid collection, or acute infarct Parenchymal volume is normal for age. The ventricles are normal in size. There is a small remote infarct in the left aspect of the genu of the corpus callosum/left frontal periventricular white matter. There is otherwise mild background chronic small-vessel ischemic change. The pituitary and suprasellar region are normal. There is no mass lesion. There is no mass effect or midline shift. Vascular: The right V4 segment flow void is diminutive, either occluded or congenitally hypoplastic. The other major arterial flow voids are normal. Skull and upper cervical spine: There is advanced degenerative change of the imaged upper cervical spine with bulky pannus about the dens resulting in moderate to severe narrowing of the craniocervical junction. There is severe spinal canal stenosis at C3-C4 and C4-C5 with kinking of the cord. There is no suspicious marrow signal abnormality. Sinuses/Orbits: The paranasal sinuses are clear. The globes and orbits are unremarkable. Other: None. IMPRESSION: 1. No acute intracranial pathology. 2. Bulky degenerative pannus about the dens resulting in moderate to severe narrowing of the craniocervical junction with mass effect on the underlying cord, and advanced degenerative change in the upper cervical spine  with severe spinal canal stenosis and cord compression at C3-C4 and C4-C5. Consider dedicated imaging as indicated. Electronically Signed   By: Valetta Mole M.D.   On: 01/04/2023 15:37   CT HEAD WO CONTRAST  Result Date: 01/04/2023 CLINICAL DATA:  Central vertigo.  Dizziness EXAM: CT HEAD WITHOUT CONTRAST TECHNIQUE: Contiguous axial images were obtained from the base of the skull through the vertex without intravenous contrast. RADIATION DOSE REDUCTION: This exam was performed according to the departmental dose-optimization program which includes automated exposure control, adjustment of the mA and/or kV according to patient size and/or use of iterative reconstruction technique. COMPARISON:  None Available. FINDINGS: Brain: No evidence of acute infarction, hemorrhage, hydrocephalus, extra-axial collection or mass lesion/mass effect. Chronic infarct with adjacent wallerian degeneration at the genu of the corpus callosum. Vascular: No hyperdense vessel or unexpected calcification. Skull: Negative for fracture or bone lesion. Minimally covered high-density posterior to the dens contacting the ventral cord, presumably pannus or ligamentum flavum thickening. Sinuses/Orbits: No acute finding IMPRESSION: 1. No acute finding. 2. Minimally seen soft tissue behind the dens, usually pannus or ligamentous thickening, that impinges on the cervicomedullary junction. Electronically Signed   By: Jorje Guild M.D.   On: 01/04/2023 10:52    Procedures Procedures    Medications Ordered in ED Medications  meclizine (ANTIVERT) tablet 12.5 mg (12.5 mg Oral Given 01/04/23 1410)    ED Course/ Medical Decision Making/ A&P Clinical Course as of 01/04/23 1610  Tue Jan 04, 2023  1027 Complete metabolic panel is reviewed and interpreted and is significant for mild hypokalemia with potassium of 3.4 [DR]  1404 CBC is reviewed and interpreted significant for thrombocytopenia with platelets of 134,000 [DR]  1405  Thrombocytopenia stable from first prior of 2 years ago [DR]  1405 CT head reviewed interpreted and no evidence of acute findings are noted radiologist interpretation notes minimally seen soft tissue behind dens usually pannus or ligamentous thickening [DR]    Clinical Course User Index [DR] Pattricia Boss, MD                             Medical Decision Making Amount and/or Complexity of Data Reviewed Labs: ordered. Radiology: ordered.  89 year old man who presents today with vertigo.  Symptoms acute onset last night at 945.  Do not appear to be within stroke window Head CT without acute abnormalities Electrolytes with mild hypokalemia otherwise within normal limits CBC normal MRI without any evidence of acute stroke Plan ambulation and probable discharge Care Discussed Dr. Darl Householder who will disposition patient         Final Clinical Impression(s) / ED Diagnoses Final diagnoses:  Vertigo    Rx / DC Orders ED Discharge Orders     None         Pattricia Boss, MD 01/04/23 1610

## 2023-01-04 NOTE — ED Notes (Signed)
Pt able to ambulate with a steady gait with a walker, with no dizziness. Darl Householder MD notified

## 2023-01-04 NOTE — ED Triage Notes (Signed)
Pt noticed dizziness only with movement since 2200 last night. Reports stumbling into a door or back onto the bed d/t dizziness but did not have true fall and no injury. Per EMS no neuro deficits and negative stroke screen. Endorses sinus congestion over the last week.

## 2023-01-04 NOTE — Progress Notes (Signed)
Visited with patient and family at bedside.  Pt said he came to ED because he was having dizziness last night and this morning.  Chaplain provided ministry of presence and emotional support and information sharing.  Chaplain available as needed.  Jaclynn Major, [place], [facility], Pager [phone]

## 2023-01-04 NOTE — ED Provider Notes (Signed)
  Physical Exam  BP 139/69   Pulse 67   Temp 98 F (36.7 C) (Oral)   Resp 16   Ht '5\' 6"'$  (1.676 m)   Wt 81.6 kg   SpO2 100%   BMI 29.05 kg/m   Physical Exam  Procedures  Procedures  ED Course / MDM   Clinical Course as of 01/04/23 1737  Tue Jan 04, 2023  5883 Complete metabolic panel is reviewed and interpreted and is significant for mild hypokalemia with potassium of 3.4 [DR]  1404 CBC is reviewed and interpreted significant for thrombocytopenia with platelets of 134,000 [DR]  1405 Thrombocytopenia stable from first prior of 2 years ago [DR]  1405 CT head reviewed interpreted and no evidence of acute findings are noted radiologist interpretation notes minimally seen soft tissue behind dens usually pannus or ligamentous thickening [DR]    Clinical Course User Index [DR] Pattricia Boss, MD   Medical Decision Making Care assumed at 3 PM.  Patient is here with dizziness.  Signed out pending MRI results.  5:37 PM MRI showed no stroke.  Patient does have severe spinal stenosis with questionable cord compression but patient ambulated with walker.  Patient is stable for discharge.  Will discharge home with meclizine as needed and neurology follow-up  Problems Addressed: Cervical stenosis of spine: acute illness or injury Vertigo: acute illness or injury  Amount and/or Complexity of Data Reviewed Labs: ordered. Decision-making details documented in ED Course. Radiology: ordered and independent interpretation performed. Decision-making details documented in ED Course.         Drenda Freeze, MD 01/04/23 [phone]

## 2023-01-04 NOTE — ED Notes (Signed)
Patient transported to MRI 

## 2023-01-04 NOTE — Discharge Instructions (Addendum)
As we discussed, you do not have any stroke on your MRI.  Take meclizine as needed for dizziness and I recommend you follow-up with neurologist.  You also have cervical spine stenosis.  I recommend that you follow-up with neurosurgery (Dr. Kathyrn Sheriff)  Return to ER if you have worse dizziness, weakness, trouble walking

## 2023-01-05 NOTE — Progress Notes (Unsigned)
[facility]  PATIENT: Scott [name] DOB: 1928-03-18  REFERRING DOCTOR OR PCP: Chaney Malling, MD; Kirby Funk, MD,(PCP) SOURCE: Patient, emergency room, MRI and CT images personally reviewed.  _________________________________   HISTORICAL  CHIEF COMPLAINT:  No chief complaint on file.   HISTORY OF PRESENT ILLNESS:  I had the pleasure of seeing your patient, Scott [name], at [facility] for neurologic consultation regarding his vertigo.  He is a 89 year old man who had the onset of vertigo 01/03/2023.  Symptoms began that night in bed when he rolled from his left to his right side.  He experienced spinning vertigo and when he tried to sit at the edge of the bed he was off balance.  He needs to use his walker to get to the bathroom.  The vertigo persisted throughout the night and he went to the emergency room 01/04/2023.  He had no other new neurologic symptoms such as changes in speech.  At baseline, gait is reduced and he sometimes uses a walker***  Imaging: MRI of the brain 01/04/2023 shows mild generalized cortical atrophy that is normal for age and a small extent of white matter change.  The internal auditory canals appear normal.  Also noted on the MRI was degenerative pannus at the craniocervical junction with mass effect on the spinal cord.  Severe spinal stenosis is also noted at C3-C4 and C4-C5 which could lead to cord compression.  Labs: CBC showed mild reduced platelet count.  Differential was fine.  CMP was noncontributory.  Ethanol was negative.  REVIEW OF SYSTEMS: Constitutional: No fevers, chills, sweats, or change in appetite Eyes: No visual changes, double vision, eye pain Ear, nose and throat: No hearing loss, ear pain, nasal congestion, sore throat Cardiovascular: No chest pain, palpitations Respiratory:  No shortness of breath at rest or with exertion.   No wheezes GastrointestinaI: No nausea, vomiting, diarrhea, abdominal  pain, fecal incontinence Genitourinary:  No dysuria, urinary retention or frequency.  No nocturia. Musculoskeletal:  No neck pain, back pain Integumentary: No rash, pruritus, skin lesions Neurological: as above Psychiatric: No depression at this time.  No anxiety Endocrine: No palpitations, diaphoresis, change in appetite, change in weigh or increased thirst Hematologic/Lymphatic:  No anemia, purpura, petechiae. Allergic/Immunologic: No itchy/runny eyes, nasal congestion, recent allergic reactions, rashes  ALLERGIES: Allergies  Allergen Reactions   Acetaminophen     Other reaction(s): mouth sores   Amlodipine Swelling    Swelling of feet and legs   Bactrim [Sulfamethoxazole-Trimethoprim] Other (See Comments)    Constipation    Codeine Nausea Only   Fexofenadine     Other reaction(s): mouth sores   Ibuprofen     Other reaction(s): nervousness   Lovenox [Enoxaparin Sodium] Other (See Comments)    MAY HAVE CAUSED THROMBOCYTOPENIA   Naproxen     Other reaction(s): mouth sores   Prednisone Swelling    Patient has tolerated doses for colds. Thinks it caused swelling in the past   Shellfish Allergy Nausea And Vomiting and Other (See Comments)    N&V, shaking   Sonata [Zaleplon] Other (See Comments)    Hyper feelings    HOME MEDICATIONS:  Current Outpatient Medications:    albuterol (VENTOLIN HFA) 108 (90 Base) MCG/ACT inhaler, Inhale 1-2 puffs into the lungs every 6 (six) hours as needed for wheezing or shortness of breath., Disp: 1 each, Rfl: 0   aspirin 81 MG tablet, Take 81 mg by mouth every evening., Disp: , Rfl:    BREO ELLIPTA 100-25 MCG/INH  AEPB, Inhale 1 puff into the lungs daily., Disp: , Rfl:    EPINEPHrine 0.3 mg/0.3 mL IJ SOAJ injection, Inject 0.3 mg into the muscle as needed for anaphylaxis., Disp: 1 each, Rfl: 0   finasteride (PROSCAR) 5 MG tablet, Take 5 mg by mouth daily before breakfast. Takes 3 tablets a week., Disp: , Rfl:    fish oil-omega-3 fatty acids 1000  MG capsule, Take 1 g by mouth every other day., Disp: , Rfl:    furosemide (LASIX) 20 MG tablet, TAKE 1 TABLET BY MOUTH ONCE EACH MORNING AS NEEDED FOR SWELLING/EDEMA, Disp: , Rfl:    hydrochlorothiazide (HYDRODIURIL) 12.5 MG tablet, Take 12.5 mg by mouth every morning., Disp: , Rfl:    Influenza Vac High-Dose Quad (FLUZONE HIGH-DOSE QUADRIVALENT) 0.7 ML SUSY, Fluzone High-Dose Quad 2020-21 (PF) 240 mcg/0.7 mL IM syringe  PHARMACY ADMINISTERED, Disp: , Rfl:    lovastatin (MEVACOR) 20 MG tablet, Take 20 mg by mouth at bedtime., Disp: , Rfl:    meclizine (ANTIVERT) 12.5 MG tablet, Take 1 tablet (12.5 mg total) by mouth 3 (three) times daily as needed for dizziness., Disp: 15 tablet, Rfl: 0   methylPREDNISolone (MEDROL DOSEPAK) 4 MG TBPK tablet, As directed, Disp: 21 tablet, Rfl: 0   metoprolol (LOPRESSOR) 50 MG tablet, Take 25 mg by mouth 2 (two) times daily., Disp: , Rfl:    potassium chloride (K-DUR,KLOR-CON) 10 MEQ tablet, Take 10 mEq by mouth every evening. , Disp: , Rfl:    potassium chloride (KLOR-CON) 10 MEQ tablet, potassium chloride ER 10 mEq tablet,extended release  TK 1 T PO QD, Disp: , Rfl:    telmisartan (MICARDIS) 40 MG tablet, Take 40 mg by mouth daily., Disp: , Rfl:    traZODone (DESYREL) 50 MG tablet, Take 1 tablet (50 mg total) by mouth at bedtime., Disp: 30 tablet, Rfl: prn   triazolam (HALCION) 0.25 MG tablet, TAKE 1 TABLET (0.25 MG TOTAL) BY MOUTH AT BEDTIME AS NEEDED FOR SLEEP., Disp: 30 tablet, Rfl: 5   vitamin E 400 UNIT capsule, Take 400 Units by mouth daily. , Disp: , Rfl:   PAST MEDICAL HISTORY: Past Medical History:  Diagnosis Date   Anemia    Acute blood loss anemia secondary to surgery, followed fr/ Hip replacement    Anxiety    pt. reports that he has occas. nervous episodes    BPH (benign prostatic hyperplasia)    CAD (coronary artery disease)    s/p CABG in 2007  CARDILOLOGIST IS DR. P. [place]   Cancer (HCC)    melanoma - removed L ear - 2010   Cranial  neuropathy    resolved    Generalized OA    GERD (gastroesophageal reflux disease)    H/O echocardiogram    last echo, stress test 05/2012- pt. followed by [place]    Hypercholesterolemia    Hypertension    Obesity    Osteoarthritis    End-stage osteoarthritis, right hip   Sleep apnea    with use of CPAP   SOB (shortness of breath)    WITH EXERTION   Thrombocytopenia (HCC)    possibly due to Lovenox    PAST SURGICAL HISTORY: Past Surgical History:  Procedure Laterality Date   ACHILLES TENDON SURGERY  10/06/2012   Procedure: ACHILLES TENDON REPAIR;  Surgeon: Thera Flake., MD;  Location: [facility] OR;  Service: Orthopedics;  Laterality: Right;  REPAIR RIGHT RUPTURE ACHILLES TENDON PRIMARY OPEN/PERCUTANEOUS, EXCISION PARTIAL BONE TALUS/CANCANEUS, REPAIR PARTIAL EXCISION CALCANEUS  APPENDECTOMY     ARTERY BIOPSY     temporal - L side- wnl, double vision treated /w prednisone - 1991   CALCANEAL OSTEOTOMY  10/06/2012   Procedure: CALCANEAL OSTEOTOMY;  Surgeon: Thera Flake., MD;  Location: [facility] OR;  Service: Orthopedics;  Laterality: Right;   CARDIAC CATHETERIZATION  12/15/2005   NORMAL. EF 60%, angioplasty 1995   carpel tunnel surgery-bilateral     both hands    CORONARY ARTERY BYPASS GRAFT  2007   LIMA GRAFT TO THE LAD, SAPHENOUS VEIN GRAFT TO THE FIRST OBTUSE MARIGNAL, SAPHENOUS VEIN GRAFT TO THE SECOND OBTUSE MARGINAL AND A SAPHENOUS VEIN GRAFT TO THE ACUTE MARGINAL AND POSTERIOR LATERAL BRANCH   HERNIA REPAIR     I & D EXTREMITY  12/11/2012   Procedure: IRRIGATION AND DEBRIDEMENT EXTREMITY;  Surgeon: Nadara Mustard, MD;  Location: [facility] OR;  Service: Orthopedics;  Laterality: Right;  Irrigation and Debridement achilles, wound closure, apply wound vac, place antibiotic beads   INGUINAL HERNIA REPAIR Left 05/10/2013   Procedure: HERNIA REPAIR INGUINAL ADULT;  Surgeon: Velora Heckler, MD;  Location: [facility];  Service: General;  Laterality: Left;   INSERTION OF MESH Left 05/10/2013   Procedure:  INSERTION OF MESH;  Surgeon: Velora Heckler, MD;  Location: [facility];  Service: General;  Laterality: Left;   JOINT REPLACEMENT     knee  11/12   left arthroscopic   TOTAL HIP ARTHROPLASTY     right    FAMILY HISTORY: Family History  Problem Relation Age of Onset   Prostate cancer Brother    Heart disease Brother    Heart disease Brother     SOCIAL HISTORY: Social History   Socioeconomic History   Marital status: Married    Spouse name: Not on file   Number of children: 2   Years of education: Not on file   Highest education level: Not on file  Occupational History   Occupation: Educational psychologist industries    Comment: retired  Tobacco Use   Smoking status: Never   Smokeless tobacco: Never  Building services engineer Use: Never used  Substance and Sexual Activity   Alcohol use: No    Alcohol/week: 0.0 standard drinks of alcohol   Drug use: No   Sexual activity: Not Currently  Other Topics Concern   Not on file  Social History Narrative   Not on file   Social Determinants of Health   Financial Resource Strain: Not on file  Food Insecurity: Not on file  Transportation Needs: Not on file  Physical Activity: Not on file  Stress: Not on file  Social Connections: Not on file  Intimate Partner Violence: Not on file       PHYSICAL EXAM  There were no vitals filed for this visit.  There is no height or weight on file to calculate BMI.   General: The patient is well-developed and well-nourished and in no acute distress  HEENT:  Head is [place]/AT.  Sclera are anicteric.  Funduscopic exam shows normal optic discs and retinal vessels.  Neck: No carotid bruits are noted.  The neck is nontender.  Cardiovascular: The heart has a regular rate and rhythm with a normal S1 and S2. There were no murmurs, gallops or rubs.    Skin: Extremities are without rash or  edema.  Musculoskeletal:  Back is nontender  Neurologic Exam  Mental status: The patient is alert and oriented x 3 at  the time of the  examination. The patient has apparent normal recent and remote memory, with an apparently normal attention span and concentration ability.   Speech is normal.  Cranial nerves: Extraocular movements are full. Pupils are equal, round, and reactive to light and accomodation.  Visual fields are full.  Facial symmetry is present. There is good facial sensation to soft touch bilaterally.Facial strength is normal.  Trapezius and sternocleidomastoid strength is normal. No dysarthria is noted.  The tongue is midline, and the patient has symmetric elevation of the soft palate. No obvious hearing deficits are noted.  Motor:  Muscle bulk is normal.   Tone is normal. Strength is  5 / 5 in all 4 extremities.   Sensory: Sensory testing is intact to pinprick, soft touch and vibration sensation in all 4 extremities.  Coordination: Cerebellar testing reveals good finger-nose-finger and heel-to-shin bilaterally.  Gait and station: Station is normal.   Gait is normal. Tandem gait is normal. Romberg is negative.   Reflexes: Deep tendon reflexes are symmetric and normal bilaterally.   Plantar responses are flexor.    DIAGNOSTIC DATA (LABS, IMAGING, TESTING) - I reviewed patient records, labs, notes, testing and imaging myself where available.  Lab Results  Component Value Date   WBC 6.3 01/04/2023   HGB 13.7 01/04/2023   HCT 41.6 01/04/2023   MCV 99.5 01/04/2023   PLT 134 (L) 01/04/2023      Component Value Date/Time   NA 139 01/04/2023 0757   NA 141 09/05/2019 1054   K 3.4 (L) 01/04/2023 0757   CL 102 01/04/2023 0757   CO2 28 01/04/2023 0757   GLUCOSE 91 01/04/2023 0757   BUN 16 01/04/2023 0757   BUN 10 09/05/2019 1054   CREATININE 1.06 01/04/2023 0757   CALCIUM 9.0 01/04/2023 0757   PROT 6.4 (L) 01/04/2023 0757   PROT 6.3 09/05/2019 1054   ALBUMIN 3.6 01/04/2023 0757   ALBUMIN 4.1 09/05/2019 1054   AST 24 01/04/2023 0757   ALT 18 01/04/2023 0757   ALKPHOS 69 01/04/2023 0757    BILITOT 1.2 01/04/2023 0757   BILITOT 1.2 09/05/2019 1054   GFRNONAA >60 01/04/2023 0757   GFRAA 86 09/05/2019 1054   Lab Results  Component Value Date   CHOL 152 05/17/2011   HDL 40.30 05/17/2011   LDLCALC 82 05/17/2011   TRIG 56 04/17/2019   CHOLHDL 4 05/17/2011   No results found for: "HGBA1C" No results found for: "VITAMINB12" Lab Results  Component Value Date   TSH 1.750 09/05/2019       ASSESSMENT AND PLAN  ***   Myia Bergh A. Epimenio Foot, MD, [facility] 01/05/2023, 7:59 PM Certified in Neurology, Clinical Neurophysiology, Sleep Medicine and Neuroimaging  [facility] [address], [address] [place], [place] [address] [phone]

## 2023-01-06 ENCOUNTER — Ambulatory Visit: Payer: Medicare HMO | Admitting: Neurology

## 2023-01-06 ENCOUNTER — Encounter: Payer: Self-pay | Admitting: Neurology

## 2023-01-06 VITALS — BP 119/52 | HR 61 | Ht 65.0 in | Wt 185.5 lb

## 2023-01-06 DIAGNOSIS — M4802 Spinal stenosis, cervical region: Secondary | ICD-10-CM

## 2023-01-06 DIAGNOSIS — H8111 Benign paroxysmal vertigo, right ear: Secondary | ICD-10-CM

## 2023-01-06 DIAGNOSIS — R269 Unspecified abnormalities of gait and mobility: Secondary | ICD-10-CM

## 2023-01-06 NOTE — Progress Notes (Signed)
[facility]  PATIENT: Scott [name] DOB: [birth year]  REFERRING DOCTOR OR PCP: Chaney Malling, MD; Kirby Funk, MD,(PCP) SOURCE: Patient, emergency room, MRI and CT images personally reviewed.  _________________________________   HISTORICAL  CHIEF COMPLAINT:  Chief Complaint  Patient presents with   Room 11    Pt is here with his Daughter. Pt states that he has dizziness. Pt states that he has balance issues. Pt states he doesn't have any nausea.     HISTORY OF PRESENT ILLNESS:  I had the pleasure of seeing your patient, Bertil Skates, at [facility] for neurologic consultation regarding his vertigo.  He is a 89 year old man who had the onset of vertigo 01/03/2023.  Symptoms began that night in bed when he rolled from his left to his right side.  He suddenly experienced spinning vertigo and when he tried to sit at the edge of the bed he was off balance.  The next morning, he felt very dizzy when he sat up   He used his walker to get to the bathroom but stumbled and almost fell.  His sone came over to help..  The vertigo persisted throughout the night and he went to the emergency room 01/04/2023.  He had no other new neurologic symptoms such as changes in speech.        Yesterday, his nephew did a maneuver (Epley?) and he feels he is better.  He was just a little dizzy when he got up this morning and has some dizziness when he quickly turns his head, still.      At baseline, he walks without a walker for short distance but will use a walker for long distance.   MRI showed stenosis at the [facility] and at [facility] ad C4C5  Imaging: MRI of the brain 01/04/2023 shows mild generalized cortical atrophy that is normal for age and a small extent of white matter change.  The internal auditory canals appear normal.  Also noted on the MRI was degenerative pannus at the craniocervical junction with mass effect on the spinal cord.  Severe spinal stenosis is also noted at  C3-C4 and C4-C5 which could lead to cord compression.  Labs: CBC showed mild reduced platelet count.  Differential was fine.  CMP was noncontributory.  Ethanol was negative.  REVIEW OF SYSTEMS: Constitutional: No fevers, chills, sweats, or change in appetite Eyes: No visual changes, double vision, eye pain Ear, nose and throat: No hearing loss, ear pain, nasal congestion, sore throat Cardiovascular: No chest pain, palpitations Respiratory:  No shortness of breath at rest or with exertion.   No wheezes GastrointestinaI: No nausea, vomiting, diarrhea, abdominal pain, fecal incontinence Genitourinary:  No dysuria, urinary retention or frequency.  No nocturia. Musculoskeletal:  No neck pain, back pain Integumentary: No rash, pruritus, skin lesions Neurological: as above Psychiatric: No depression at this time.  No anxiety Endocrine: No palpitations, diaphoresis, change in appetite, change in weigh or increased thirst Hematologic/Lymphatic:  No anemia, purpura, petechiae. Allergic/Immunologic: No itchy/runny eyes, nasal congestion, recent allergic reactions, rashes  ALLERGIES: Allergies  Allergen Reactions   Acetaminophen     Other reaction(s): mouth sores   Amlodipine Swelling    Swelling of feet and legs   Bactrim [Sulfamethoxazole-Trimethoprim] Other (See Comments)    Constipation    Codeine Nausea Only   Fexofenadine     Other reaction(s): mouth sores   Ibuprofen     Other reaction(s): nervousness   Lovenox [Enoxaparin Sodium] Other (See Comments)    MAY  HAVE CAUSED THROMBOCYTOPENIA   Naproxen     Other reaction(s): mouth sores   Prednisone Swelling    Patient has tolerated doses for colds. Thinks it caused swelling in the past   Shellfish Allergy Nausea And Vomiting and Other (See Comments)    N&V, shaking   Sonata [Zaleplon] Other (See Comments)    Hyper feelings    HOME MEDICATIONS:  Current Outpatient Medications:    albuterol (VENTOLIN HFA) 108 (90 Base) MCG/ACT  inhaler, Inhale 1-2 puffs into the lungs every 6 (six) hours as needed for wheezing or shortness of breath., Disp: 1 each, Rfl: 0   aspirin 81 MG tablet, Take 81 mg by mouth every evening., Disp: , Rfl:    BREO ELLIPTA 100-25 MCG/INH AEPB, Inhale 1 puff into the lungs daily., Disp: , Rfl:    EPINEPHrine 0.3 mg/0.3 mL IJ SOAJ injection, Inject 0.3 mg into the muscle as needed for anaphylaxis., Disp: 1 each, Rfl: 0   furosemide (LASIX) 20 MG tablet, TAKE 1 TABLET BY MOUTH ONCE EACH MORNING AS NEEDED FOR SWELLING/EDEMA, Disp: , Rfl:    hydrochlorothiazide (HYDRODIURIL) 12.5 MG tablet, Take 12.5 mg by mouth every morning., Disp: , Rfl:    Influenza Vac High-Dose Quad (FLUZONE HIGH-DOSE QUADRIVALENT) 0.7 ML SUSY, Fluzone High-Dose Quad 2020-21 (PF) 240 mcg/0.7 mL IM syringe  PHARMACY ADMINISTERED, Disp: , Rfl:    lovastatin (MEVACOR) 20 MG tablet, Take 20 mg by mouth at bedtime., Disp: , Rfl:    meclizine (ANTIVERT) 12.5 MG tablet, Take 1 tablet (12.5 mg total) by mouth 3 (three) times daily as needed for dizziness., Disp: 15 tablet, Rfl: 0   methylPREDNISolone (MEDROL DOSEPAK) 4 MG TBPK tablet, As directed, Disp: 21 tablet, Rfl: 0   metoprolol (LOPRESSOR) 50 MG tablet, Take 25 mg by mouth 2 (two) times daily., Disp: , Rfl:    potassium chloride (K-DUR,KLOR-CON) 10 MEQ tablet, Take 10 mEq by mouth every evening. , Disp: , Rfl:    potassium chloride (KLOR-CON) 10 MEQ tablet, potassium chloride ER 10 mEq tablet,extended release  TK 1 T PO QD, Disp: , Rfl:    telmisartan (MICARDIS) 40 MG tablet, Take 40 mg by mouth daily., Disp: , Rfl:    traZODone (DESYREL) 50 MG tablet, Take 1 tablet (50 mg total) by mouth at bedtime., Disp: 30 tablet, Rfl: prn   vitamin E 400 UNIT capsule, Take 400 Units by mouth daily. , Disp: , Rfl:    fish oil-omega-3 fatty acids 1000 MG capsule, Take 1 g by mouth every other day. (Patient not taking: Reported on 01/06/2023), Disp: , Rfl:    triazolam (HALCION) 0.25 MG tablet, TAKE 1  TABLET (0.25 MG TOTAL) BY MOUTH AT BEDTIME AS NEEDED FOR SLEEP. (Patient not taking: Reported on 01/06/2023), Disp: 30 tablet, Rfl: 5  PAST MEDICAL HISTORY: Past Medical History:  Diagnosis Date   Anemia    Acute blood loss anemia secondary to surgery, followed fr/ Hip replacement    Anxiety    pt. reports that he has occas. nervous episodes    BPH (benign prostatic hyperplasia)    CAD (coronary artery disease)    s/p CABG in 2007  CARDILOLOGIST IS DR. P. [place]   Cancer (HCC)    melanoma - removed L ear - 2010   Cranial neuropathy    resolved    Generalized OA    GERD (gastroesophageal reflux disease)    H/O echocardiogram    last echo, stress test 05/2012- pt. followed by Cor[facility]  Hypercholesterolemia    Hypertension    Obesity    Osteoarthritis    End-stage osteoarthritis, right hip   Sleep apnea    with use of CPAP   SOB (shortness of breath)    WITH EXERTION   Thrombocytopenia (HCC)    possibly due to Lovenox    PAST SURGICAL HISTORY: Past Surgical History:  Procedure Laterality Date   ACHILLES TENDON SURGERY  10/06/2012   Procedure: ACHILLES TENDON REPAIR;  Surgeon: Thera Flake., MD;  Location: [facility] OR;  Service: Orthopedics;  Laterality: Right;  REPAIR RIGHT RUPTURE ACHILLES TENDON PRIMARY OPEN/PERCUTANEOUS, EXCISION PARTIAL BONE TALUS/CANCANEUS, REPAIR PARTIAL EXCISION CALCANEUS   APPENDECTOMY     ARTERY BIOPSY     temporal - L side- wnl, double vision treated /w prednisone - 1991   CALCANEAL OSTEOTOMY  10/06/2012   Procedure: CALCANEAL OSTEOTOMY;  Surgeon: Thera Flake., MD;  Location: [facility] OR;  Service: Orthopedics;  Laterality: Right;   CARDIAC CATHETERIZATION  12/15/2005   NORMAL. EF 60%, angioplasty 1995   carpel tunnel surgery-bilateral     both hands    CORONARY ARTERY BYPASS GRAFT  2007   LIMA GRAFT TO THE LAD, SAPHENOUS VEIN GRAFT TO THE FIRST OBTUSE MARIGNAL, SAPHENOUS VEIN GRAFT TO THE SECOND OBTUSE MARGINAL AND A SAPHENOUS VEIN GRAFT TO THE ACUTE  MARGINAL AND POSTERIOR LATERAL BRANCH   HERNIA REPAIR     I & D EXTREMITY  12/11/2012   Procedure: IRRIGATION AND DEBRIDEMENT EXTREMITY;  Surgeon: Nadara Mustard, MD;  Location: [facility] OR;  Service: Orthopedics;  Laterality: Right;  Irrigation and Debridement achilles, wound closure, apply wound vac, place antibiotic beads   INGUINAL HERNIA REPAIR Left 05/10/2013   Procedure: HERNIA REPAIR INGUINAL ADULT;  Surgeon: Velora Heckler, MD;  Location: [facility];  Service: General;  Laterality: Left;   INSERTION OF MESH Left 05/10/2013   Procedure: INSERTION OF MESH;  Surgeon: Velora Heckler, MD;  Location: [facility];  Service: General;  Laterality: Left;   JOINT REPLACEMENT     knee  11/12   left arthroscopic   TOTAL HIP ARTHROPLASTY     right    FAMILY HISTORY: Family History  Problem Relation Age of Onset   Prostate cancer Brother    Heart disease Brother    Heart disease Brother     SOCIAL HISTORY: Social History   Socioeconomic History   Marital status: Married    Spouse name: Not on file   Number of children: 2   Years of education: Not on file   Highest education level: Not on file  Occupational History   Occupation: Educational psychologist industries    Comment: retired  Tobacco Use   Smoking status: Never   Smokeless tobacco: Never  Building services engineer Use: Never used  Substance and Sexual Activity   Alcohol use: No    Alcohol/week: 0.0 standard drinks of alcohol   Drug use: No   Sexual activity: Not Currently  Other Topics Concern   Not on file  Social History Narrative   Not on file   Social Determinants of Health   Financial Resource Strain: Not on file  Food Insecurity: Not on file  Transportation Needs: Not on file  Physical Activity: Not on file  Stress: Not on file  Social Connections: Not on file  Intimate Partner Violence: Not on file       PHYSICAL EXAM  Vitals:   01/06/23 1411  BP: (!) 119/52  Pulse: 61  Weight: 185 lb 8 oz (84.1 kg)  Height: 5\' 5"  (1.651 m)     Body mass index is 30.87 kg/m.   General: The patient is well-developed and well-nourished and in no acute distress  HEENT:  Head is [place]/AT.  Sclera are anicteric.  There is some cerumen in the ear canals but I can see the tympanic membranes.  Neck: No carotid bruits are noted.  The neck is nontender.  Cardiovascular: The heart has a regular rate and rhythm with a normal S1 and S2. There were no murmurs, gallops or rubs.    Skin: Extremities are without rash or  edema.  Musculoskeletal:  Back is nontender  Neurologic Exam  Mental status: The patient is alert and oriented x 3 at the time of the examination. The patient has apparent normal recent and remote memory, with an apparently normal attention span and concentration ability.   Speech is normal.  Cranial nerves: Extraocular movements are full.  Facial strength and sensation was normal.  Trapezius and sternocleidomastoid strength is normal. No dysarthria is noted.  The tongue is midline, and the patient has symmetric elevation of the soft palate. No obvious hearing deficits are noted.  The Weber did not lateralize.  Motor:  Muscle bulk is normal.   Tone is normal. Strength is  5 / 5 in all 4 extremities.   Sensory: Sensory testing is intact to pinprick, soft touch and vibration sensation in all 4 extremities.  Coordination: Cerebellar testing reveals good finger-nose-finger and heel-to-shin bilaterally.  Gait and station: Station is normal.   He can stand up by himself and needs to use his arms.  The gait is mildly ataxic.  He is unable to tandem gait.  Turns are off balance.  Romberg is negative.     Reflexes: Deep tendon reflexes are symmetric and normal bilaterally.       DIAGNOSTIC DATA (LABS, IMAGING, TESTING) - I reviewed patient records, labs, notes, testing and imaging myself where available.  Lab Results  Component Value Date   WBC 6.3 01/04/2023   HGB 13.7 01/04/2023   HCT 41.6 01/04/2023   MCV 99.5  01/04/2023   PLT 134 (L) 01/04/2023      Component Value Date/Time   NA 139 01/04/2023 0757   NA 141 09/05/2019 1054   K 3.4 (L) 01/04/2023 0757   CL 102 01/04/2023 0757   CO2 28 01/04/2023 0757   GLUCOSE 91 01/04/2023 0757   BUN 16 01/04/2023 0757   BUN 10 09/05/2019 1054   CREATININE 1.06 01/04/2023 0757   CALCIUM 9.0 01/04/2023 0757   PROT 6.4 (L) 01/04/2023 0757   PROT 6.3 09/05/2019 1054   ALBUMIN 3.6 01/04/2023 0757   ALBUMIN 4.1 09/05/2019 1054   AST 24 01/04/2023 0757   ALT 18 01/04/2023 0757   ALKPHOS 69 01/04/2023 0757   BILITOT 1.2 01/04/2023 0757   BILITOT 1.2 09/05/2019 1054   GFRNONAA >60 01/04/2023 0757   GFRAA 86 09/05/2019 1054   Lab Results  Component Value Date   CHOL 152 05/17/2011   HDL 40.30 05/17/2011   LDLCALC 82 05/17/2011   TRIG 56 04/17/2019   CHOLHDL 4 05/17/2011   No results found for: "HGBA1C" No results found for: "VITAMINB12" Lab Results  Component Value Date   TSH 1.750 09/05/2019       ASSESSMENT AND PLAN  Benign paroxysmal positional vertigo of right ear  Cervical spinal stenosis  Gait disturbance   In summary, Mr. Clemenson is a 89 year old man  who had the sudden onset of vertigo while changing positions in bed a couple of nights ago.  Symptoms were very consistent with benign paroxysmal positional vertigo.  His nephew knew how to do the Epley maneuver and came over yesterday and did the procedure.  This seems to have helped him quite a bit and he feels just slightly dizzy when he moves his head fast currently.  The Dix-Hallpike maneuver today did not evoke nystagmus or vertigo so I believe that the Epley maneuver he had yesterday fixed his BPPV.  I did instruct him and his daughter on the Brandt-Daroff exercise as a backup and he will do this exercise on his own or have his nephew come over to repeat the Epley if symptoms act up again.  A second problem is gait ataxia and this is likely related to his significant spinal  stenosis at the cervicomedullary junction (degenerative pannus) and at C3-C4 and C4-C5 that was seen on the sagittal brain MRI.  He is not interested in pursuing surgery unless there is no other option so I will hold off on imaging the cervical spine.  I did mention to him and his daughter that if his gait significantly worsens that this needs to get checked and I would still recommend a surgical opinion, though due to his age surgery would probably only be performed if no other option.  H  He will return to see me as needed if he has new or worsening symptoms.  Thank you for asking me to see Mr. Steinke.  Please let me know if I can be of further assistance with him or other patients in the future.   Mikias Lanz A. Epimenio Foot, MD, [facility] 01/06/2023, 3:50 PM Certified in Neurology, Clinical Neurophysiology, Sleep Medicine and Neuroimaging  [facility] [address] [place], [place] [address] [phone]

## 2023-01-11 DIAGNOSIS — Z08 Encounter for follow-up examination after completed treatment for malignant neoplasm: Secondary | ICD-10-CM | POA: Diagnosis not present

## 2023-01-11 DIAGNOSIS — Z85828 Personal history of other malignant neoplasm of skin: Secondary | ICD-10-CM | POA: Diagnosis not present

## 2023-01-13 DIAGNOSIS — M170 Bilateral primary osteoarthritis of knee: Secondary | ICD-10-CM | POA: Diagnosis not present

## 2023-01-28 DIAGNOSIS — J309 Allergic rhinitis, unspecified: Secondary | ICD-10-CM | POA: Diagnosis not present

## 2023-01-28 DIAGNOSIS — J4489 Other specified chronic obstructive pulmonary disease: Secondary | ICD-10-CM | POA: Diagnosis not present

## 2023-01-28 DIAGNOSIS — G4733 Obstructive sleep apnea (adult) (pediatric): Secondary | ICD-10-CM | POA: Diagnosis not present

## 2023-01-28 DIAGNOSIS — I2510 Atherosclerotic heart disease of native coronary artery without angina pectoris: Secondary | ICD-10-CM | POA: Diagnosis not present

## 2023-01-28 DIAGNOSIS — M1990 Unspecified osteoarthritis, unspecified site: Secondary | ICD-10-CM | POA: Diagnosis not present

## 2023-01-28 DIAGNOSIS — K219 Gastro-esophageal reflux disease without esophagitis: Secondary | ICD-10-CM | POA: Diagnosis not present

## 2023-01-28 DIAGNOSIS — G629 Polyneuropathy, unspecified: Secondary | ICD-10-CM | POA: Diagnosis not present

## 2023-01-28 DIAGNOSIS — R69 Illness, unspecified: Secondary | ICD-10-CM | POA: Diagnosis not present

## 2023-01-28 DIAGNOSIS — R2689 Other abnormalities of gait and mobility: Secondary | ICD-10-CM | POA: Diagnosis not present

## 2023-01-28 DIAGNOSIS — G4700 Insomnia, unspecified: Secondary | ICD-10-CM | POA: Diagnosis not present

## 2023-01-28 DIAGNOSIS — Z008 Encounter for other general examination: Secondary | ICD-10-CM | POA: Diagnosis not present

## 2023-01-28 DIAGNOSIS — E785 Hyperlipidemia, unspecified: Secondary | ICD-10-CM | POA: Diagnosis not present

## 2023-01-28 DIAGNOSIS — I509 Heart failure, unspecified: Secondary | ICD-10-CM | POA: Diagnosis not present

## 2023-04-04 NOTE — Progress Notes (Unsigned)
Cardiology Clinic Note   Patient Name: Scott [name] Date of Encounter: 04/05/2023  Primary Care Provider:  [facility] Primary Cardiologist:  Peter Swaziland, MD  Patient Profile    Scott [name] 89 year old male presents to the clinic today for follow-up evaluation of his coronary artery disease and essential hypertension.  Past Medical History    Past Medical History:  Diagnosis Date   Anemia    Acute blood loss anemia secondary to surgery, followed fr/ Hip replacement    Anxiety    pt. reports that he has occas. nervous episodes    BPH (benign prostatic hyperplasia)    CAD (coronary artery disease)    s/p CABG in 2007  CARDILOLOGIST IS DR. P. Swaziland   Cancer (HCC)    melanoma - removed L ear - 2010   Cranial neuropathy    resolved    Generalized OA    GERD (gastroesophageal reflux disease)    H/O echocardiogram    last echo, stress test 05/2012- pt. followed by [facility]    Hypercholesterolemia    Hypertension    Obesity    Osteoarthritis    End-stage osteoarthritis, right hip   Sleep apnea    with use of CPAP   SOB (shortness of breath)    WITH EXERTION   Thrombocytopenia (HCC)    possibly due to Lovenox   Past Surgical History:  Procedure Laterality Date   ACHILLES TENDON SURGERY  10/06/2012   Procedure: ACHILLES TENDON REPAIR;  Surgeon: Thera Flake., MD;  Location: [facility] OR;  Service: Orthopedics;  Laterality: Right;  REPAIR RIGHT RUPTURE ACHILLES TENDON PRIMARY OPEN/PERCUTANEOUS, EXCISION PARTIAL BONE TALUS/CANCANEUS, REPAIR PARTIAL EXCISION CALCANEUS   APPENDECTOMY     ARTERY BIOPSY     temporal - L side- wnl, double vision treated /w prednisone - 1991   CALCANEAL OSTEOTOMY  10/06/2012   Procedure: CALCANEAL OSTEOTOMY;  Surgeon: Thera Flake., MD;  Location: [facility] OR;  Service: Orthopedics;  Laterality: Right;   CARDIAC CATHETERIZATION  12/15/2005   NORMAL. EF 60%, angioplasty 1995   carpel tunnel surgery-bilateral     both hands     CORONARY ARTERY BYPASS GRAFT  2007   LIMA GRAFT TO THE LAD, SAPHENOUS VEIN GRAFT TO THE FIRST OBTUSE MARIGNAL, SAPHENOUS VEIN GRAFT TO THE SECOND OBTUSE MARGINAL AND A SAPHENOUS VEIN GRAFT TO THE ACUTE MARGINAL AND POSTERIOR LATERAL BRANCH   HERNIA REPAIR     I & D EXTREMITY  12/11/2012   Procedure: IRRIGATION AND DEBRIDEMENT EXTREMITY;  Surgeon: Nadara Mustard, MD;  Location: [facility] OR;  Service: Orthopedics;  Laterality: Right;  Irrigation and Debridement achilles, wound closure, apply wound vac, place antibiotic beads   INGUINAL HERNIA REPAIR Left 05/10/2013   Procedure: HERNIA REPAIR INGUINAL ADULT;  Surgeon: Velora Heckler, MD;  Location: [facility];  Service: General;  Laterality: Left;   INSERTION OF MESH Left 05/10/2013   Procedure: INSERTION OF MESH;  Surgeon: Velora Heckler, MD;  Location: [facility];  Service: General;  Laterality: Left;   JOINT REPLACEMENT     knee  11/12   left arthroscopic   TOTAL HIP ARTHROPLASTY     right    Allergies  Allergies  Allergen Reactions   Acetaminophen     Other reaction(s): mouth sores   Amlodipine Swelling    Swelling of feet and legs   Bactrim [Sulfamethoxazole-Trimethoprim] Other (See Comments)    Constipation    Codeine Nausea Only  Fexofenadine     Other reaction(s): mouth sores   Ibuprofen     Other reaction(s): nervousness   Lovenox [Enoxaparin Sodium] Other (See Comments)    MAY HAVE CAUSED THROMBOCYTOPENIA   Naproxen     Other reaction(s): mouth sores   Prednisone Swelling    Patient has tolerated doses for colds. Thinks it caused swelling in the past   Shellfish Allergy Nausea And Vomiting and Other (See Comments)    N&V, shaking   Sonata [Zaleplon] Other (See Comments)    Hyper feelings    History of Present Illness    Scott [name] is a PMH of coronary artery disease status post CABG 2007, HLD, HTN, GERD, obesity, osteoarthritis, and dizziness.  Stress testing 2010 was negative.  Echocardiogram 2020 normal EF showed G2 DD and  mild pulmonary hypertension.  He was seen in follow-up by Dr. Swaziland on 01/26/2022.  During that time he remained stable from a cardiac standpoint.  He denies chest pain or dyspnea.  He was getting around well.  He denied lower extremity swelling except for in his left ankle.  He was taking Lasix as needed.  He presented to the emergency department on 01/04/2023.  He reported dizziness.  His lab work was unremarkable.  His head CT and brain MRI were unremarkable.  He was instructed to follow-up with neurology.  He was prescribed as needed meclizine.  His EKG at that time showed sinus rhythm with first-degree AV block 61 bpm  He presents the clinic today for follow-up evaluation and states he continues to do fairly well.  He has been walking to see his wife who is now and memory care unit nursing facility.  He reports that it became too difficult for him to care for her and her colostomy bag.  He has not been to see her today but go several times through the week.  His blood pressure is well-controlled.  He denies chest pain or shortness of breath.  I will give him the salty 6 diet sheet and plan follow-up in 8 months.  Today he denies chest pain, shortness of breath, lower extremity edema, fatigue, palpitations, melena, hematuria, hemoptysis, diaphoresis, weakness, presyncope, syncope, orthopnea, and PND.   Home Medications    Prior to Admission medications   Medication Sig Start Date End Date Taking? Authorizing Provider  albuterol (VENTOLIN HFA) 108 (90 Base) MCG/ACT inhaler Inhale 1-2 puffs into the lungs every 6 (six) hours as needed for wheezing or shortness of breath. 11/28/20   Corliss Blacker, MD  aspirin 81 MG tablet Take 81 mg by mouth every evening.    [provider]  BREO ELLIPTA 100-25 MCG/INH AEPB Inhale 1 puff into the lungs daily. 12/23/20   [provider]  EPINEPHrine 0.3 mg/0.3 mL IJ SOAJ injection Inject 0.3 mg into the muscle as needed for anaphylaxis. 11/28/20    Corliss Blacker, MD  fish oil-omega-3 fatty acids 1000 MG capsule Take 1 g by mouth every other day. Patient not taking: Reported on 01/06/2023    [provider]  furosemide (LASIX) 20 MG tablet TAKE 1 TABLET BY MOUTH ONCE EACH MORNING AS NEEDED FOR SWELLING/EDEMA 08/26/19   [provider]  hydrochlorothiazide (HYDRODIURIL) 12.5 MG tablet Take 12.5 mg by mouth every morning. 12/13/19   [provider]  Influenza Vac High-Dose Quad (FLUZONE HIGH-DOSE QUADRIVALENT) 0.7 ML SUSY Fluzone High-Dose Quad 2020-21 (PF) 240 mcg/0.7 mL IM syringe  PHARMACY ADMINISTERED    [provider]  lovastatin (MEVACOR) 20  MG tablet Take 20 mg by mouth at bedtime.    [provider]  meclizine (ANTIVERT) 12.5 MG tablet Take 1 tablet (12.5 mg total) by mouth 3 (three) times daily as needed for dizziness. 01/04/23   Charlynne Pander, MD  methylPREDNISolone (MEDROL DOSEPAK) 4 MG TBPK tablet As directed 05/16/20   Swaziland, Peter M, MD  metoprolol (LOPRESSOR) 50 MG tablet Take 25 mg by mouth 2 (two) times daily.    [provider]  potassium chloride (K-DUR,KLOR-CON) 10 MEQ tablet Take 10 mEq by mouth every evening.     [provider]  potassium chloride (KLOR-CON) 10 MEQ tablet potassium chloride ER 10 mEq tablet,extended release  TK 1 T PO QD    [provider]  telmisartan (MICARDIS) 40 MG tablet Take 40 mg by mouth daily. 08/14/19   [provider]  traZODone (DESYREL) 50 MG tablet Take 1 tablet (50 mg total) by mouth at bedtime. 12/03/14   Young, Joni Fears D, MD  triazolam (HALCION) 0.25 MG tablet TAKE 1 TABLET (0.25 MG TOTAL) BY MOUTH AT BEDTIME AS NEEDED FOR SLEEP. Patient not taking: Reported on 01/06/2023 05/26/21   Waymon Budge, MD  vitamin E 400 UNIT capsule Take 400 Units by mouth daily.     [provider]    Family History    Family History  Problem Relation Age of Onset   Prostate cancer Brother    Heart disease Brother     Heart disease Brother    He indicated that his mother is deceased. He indicated that his father is deceased. He indicated that both of his sisters are alive. He indicated that all of his three brothers are deceased. He indicated that his maternal grandmother is deceased. He indicated that his maternal grandfather is deceased. He indicated that his paternal grandmother is deceased. He indicated that his paternal grandfather is deceased.  Social History    Social History   Socioeconomic History   Marital status: Married    Spouse name: Not on file   Number of children: 2   Years of education: Not on file   Highest education level: Not on file  Occupational History   Occupation: [employer]    Comment: retired  Tobacco Use   Smoking status: Never   Smokeless tobacco: Never  Building services engineer Use: Never used  Substance and Sexual Activity   Alcohol use: No    Alcohol/week: 0.0 standard drinks of alcohol   Drug use: No   Sexual activity: Not Currently  Other Topics Concern   Not on file  Social History Narrative   Not on file   Social Determinants of Health   Financial Resource Strain: Not on file  Food Insecurity: Not on file  Transportation Needs: Not on file  Physical Activity: Not on file  Stress: Not on file  Social Connections: Not on file  Intimate Partner Violence: Not on file     Review of Systems    General:  No chills, fever, night sweats or weight changes.  Cardiovascular:  No chest pain, dyspnea on exertion, edema, orthopnea, palpitations, paroxysmal nocturnal dyspnea. Dermatological: No rash, lesions/masses Respiratory: No cough, dyspnea Urologic: No hematuria, dysuria Abdominal:   No nausea, vomiting, diarrhea, bright red blood per rectum, melena, or hematemesis Neurologic:  No visual changes, wkns, changes in mental status. All other systems reviewed and are otherwise negative except as noted above.  Physical Exam    VS:  BP 138/60  Pulse 67   Ht 5\' 4"  (1.626 m)   Wt 178 lb (80.7 kg)   SpO2 96%   BMI 30.55 kg/m  , BMI Body mass index is 30.55 kg/m. GEN: Well nourished, well developed, in no acute distress. HEENT: normal. Neck: Supple, no JVD, carotid bruits, or masses. Cardiac: RRR, no murmurs, rubs, or gallops. No clubbing, cyanosis, generalized bilateral lower extremity nonpitting edema.  Radials/DP/PT 2+ and equal bilaterally.  Respiratory:  Respirations regular and unlabored, clear to auscultation bilaterally. GI: Soft, nontender, nondistended, BS + x 4. MS: no deformity or atrophy. Skin: warm and dry, no rash. Neuro:  Strength and sensation are intact. Psych: Normal affect.  Accessory Clinical Findings    Recent Labs: 01/04/2023: ALT 18; BUN 16; Creatinine, Ser 1.06; Hemoglobin 13.7; Platelets 134; Potassium 3.4; Sodium 139   Recent Lipid Panel    Component Value Date/Time   CHOL 152 05/17/2011 0826   TRIG 56 04/17/2019 0455   HDL 40.30 05/17/2011 0826   CHOLHDL 4 05/17/2011 0826   VLDL 29.6 05/17/2011 0826   LDLCALC 82 05/17/2011 0826         ECG personally reviewed by me today-none today.  Echocardiogram 09/10/2019 IMPRESSIONS     1. Left ventricular ejection fraction, by visual estimation, is 60 to  65%. The left ventricle has normal function. Normal left ventricular size.  There is mildly increased left ventricular hypertrophy.   2. Left ventricular diastolic Doppler parameters are consistent with  impaired relaxation pattern of LV diastolic filling.   3. Global right ventricle has normal systolic function.The right  ventricular size is mildly enlarged. No increase in right ventricular wall  thickness.   4. Left atrial size was normal.   5. Right atrial size was normal.   6. The mitral valve is normal in structure. Mild mitral valve  regurgitation. No evidence of mitral stenosis.   7. The tricuspid valve is normal in structure. Tricuspid valve  regurgitation is trivial.   8. The  aortic valve is normal in structure. Aortic valve regurgitation  was not visualized by color flow Doppler. Structurally normal aortic  valve, with no evidence of sclerosis or stenosis.   9. The pulmonic valve was normal in structure. Pulmonic valve  regurgitation is trivial by color flow Doppler.  10. There is mild dilatation of the aortic root measuring 42 mm.  11. Mildly elevated pulmonary artery systolic pressure.  12. The tricuspid regurgitant velocity is 2.71 m/s, and with an assumed  right atrial pressure of 8 mmHg, the estimated right ventricular systolic  pressure is mildly elevated at 37.4 mmHg.  13. The inferior vena cava is normal in size with greater than 50%  respiratory variability, suggesting right atrial pressure of 3 mmHg.   In comparison to the previous echocardiogram(s): 05/23/12 EF 55-65%. PA  pressure .  FINDINGS   Left Ventricle: Left ventricular ejection fraction, by visual estimation,  is 60 to 65%. The left ventricle has normal function. There is mildly  increased left ventricular hypertrophy. Normal left ventricular size.  Spectral Doppler shows Left ventricular   diastolic Doppler parameters are consistent with impaired relaxation  pattern of LV diastolic filling.   Right Ventricle: The right ventricular size is mildly enlarged. No  increase in right ventricular wall thickness. Global RV systolic function  is has normal systolic function. The tricuspid regurgitant velocity is  2.71 m/s, and with an assumed right atrial   pressure of 8 mmHg, the estimated right ventricular systolic pressure is  mildly  elevated at 37.4 mmHg.   Left Atrium: Left atrial size was normal in size.   Right Atrium: Right atrial size was normal in size   Pericardium: There is no evidence of pericardial effusion.   Mitral Valve: The mitral valve is normal in structure. No evidence of  mitral valve stenosis by observation. Mild mitral valve regurgitation.   Tricuspid Valve:  The tricuspid valve is normal in structure. Tricuspid  valve regurgitation is trivial by color flow Doppler.   Aortic Valve: The aortic valve is normal in structure. Aortic valve  regurgitation was not visualized by color flow Doppler. The aortic valve  is structurally normal, with no evidence of sclerosis or stenosis.   Pulmonic Valve: The pulmonic valve was normal in structure. Pulmonic valve  regurgitation is trivial by color flow Doppler.   Aorta: The aortic root, ascending aorta and aortic arch are all  structurally normal, with no evidence of dilitation or obstruction. There  is mild dilatation of the aortic root measuring 42 mm.   Venous: The inferior vena cava is normal in size with greater than 50%  respiratory variability, suggesting right atrial pressure of 3 mmHg.   IAS/Shunts: No atrial level shunt detected by color flow Doppler. No  ventricular septal defect is seen or detected. There is no evidence of an  atrial septal defect.     Assessment & Plan   1.  Coronary artery disease-no chest pain today.  Denies recent episodes of arm neck back or chest discomfort.  Underwent CABG in 2007 by Dr. Tyrone Sage.  Had negative stress test in 2010. Continue aspirin, fish oil, metoprolol, lovastatin Heart healthy low-sodium high-fiber diet  Hyperlipidemia-LDL 75 01/27/22. High-fiber diet Increase physical activity as tolerated Continue aspirin, fish oil, lovastatin  Essential hypertension-BP today 138/60. Maintain blood pressure log Low-sodium diet Metoprolol, telmisartan, HCTZ  Dizziness-note some improvement with the use of meclizine and Epley maneuvers.  Denies lightheadedness, presyncope or syncope.  Follow-up with neurology as scheduled. Change positions slowly. Increase p.o. hydration Lower extremity support stockings  GERD-has had some improvement with Tums.  Does not tolerate Prilosec. Start Protonix 20 mg daily   Disposition: Follow-up with Dr. Swaziland or me in  6-8 months.   Thomasene Ripple. Ramonte Mena NP-C     04/05/2023, 3:45 PM [facility] [address] Office [phone] Fax [phone]    I spent 14 minutes examining this patient, reviewing medications, and using patient centered shared decision making involving her cardiac care.  Prior to her visit I spent greater than 20 minutes reviewing her past medical history,  medications, and prior cardiac tests.

## 2023-04-05 ENCOUNTER — Ambulatory Visit: Payer: Medicare HMO | Attending: General Practice | Admitting: General Practice

## 2023-04-05 ENCOUNTER — Encounter: Payer: Self-pay | Admitting: General Practice

## 2023-04-05 VITALS — BP 138/60 | HR 67 | Ht 64.0 in | Wt 178.0 lb

## 2023-04-05 DIAGNOSIS — I10 Essential (primary) hypertension: Secondary | ICD-10-CM | POA: Diagnosis not present

## 2023-04-05 DIAGNOSIS — I25810 Atherosclerosis of coronary artery bypass graft(s) without angina pectoris: Secondary | ICD-10-CM | POA: Diagnosis not present

## 2023-04-05 DIAGNOSIS — E7800 Pure hypercholesterolemia, unspecified: Secondary | ICD-10-CM | POA: Diagnosis not present

## 2023-04-05 DIAGNOSIS — R42 Dizziness and giddiness: Secondary | ICD-10-CM | POA: Diagnosis not present

## 2023-04-05 DIAGNOSIS — K2100 Gastro-esophageal reflux disease with esophagitis, without bleeding: Secondary | ICD-10-CM | POA: Diagnosis not present

## 2023-04-05 MED ORDER — PANTOPRAZOLE SODIUM 20 MG PO TBEC: 20.0000 mg | DELAYED_RELEASE_TABLET | Freq: Every day | ORAL | 0 refills | Status: DC

## 2023-04-05 NOTE — Patient Instructions (Signed)
Medication Instructions:  START TAKING PROTONIX-IF THIS WORKS GET REFILLS WITH PRIMARY CARE ONLY *If you need a refill on your cardiac medications before your next appointment, please call your pharmacy*  Lab Work: NONE If you have labs (blood work) drawn today and your tests are completely normal, you will receive your results only by: MyChart Message (if you have MyChart) OR A paper copy in the mail If you have any lab test that is abnormal or we need to change your treatment, we will call you to review the results.  Testing/Procedures: NONE  Follow-Up: At [facility], you and your health needs are our priority.  As part of our continuing mission to provide you with exceptional heart care, we have created designated Provider Care Teams.  These Care Teams include your primary Cardiologist (physician) and Advanced Practice Providers (APPs -  Physician Assistants and Nurse Practitioners) who all work together to provide you with the care you need, when you need it.  We recommend signing up for the patient portal called "MyChart".  Sign up information is provided on this After Visit Summary.  MyChart is used to connect with patients for Virtual Visits (Telemedicine).  Patients are able to view lab/test results, encounter notes, upcoming appointments, etc.  Non-urgent messages can be sent to your provider as well.   To learn more about what you can do with MyChart, go to [email or web address].    Your next appointment:   6-8 month(s)  Provider:   Peter Swaziland, MD  or Edd Fabian, FNP-C     Other Instructions MAKE SURE TO DO YOUR EPLEY MANEUVERS BEFORE GETTING OUT OF BED IN THE AM

## 2023-04-11 DIAGNOSIS — D649 Anemia, unspecified: Secondary | ICD-10-CM | POA: Diagnosis not present

## 2023-04-11 DIAGNOSIS — J4520 Mild intermittent asthma, uncomplicated: Secondary | ICD-10-CM | POA: Diagnosis not present

## 2023-04-11 DIAGNOSIS — E7800 Pure hypercholesterolemia, unspecified: Secondary | ICD-10-CM | POA: Diagnosis not present

## 2023-04-11 DIAGNOSIS — I2510 Atherosclerotic heart disease of native coronary artery without angina pectoris: Secondary | ICD-10-CM | POA: Diagnosis not present

## 2023-04-11 DIAGNOSIS — I10 Essential (primary) hypertension: Secondary | ICD-10-CM | POA: Diagnosis not present

## 2023-04-11 DIAGNOSIS — Z9989 Dependence on other enabling machines and devices: Secondary | ICD-10-CM | POA: Diagnosis not present

## 2023-04-11 DIAGNOSIS — I700 Atherosclerosis of aorta: Secondary | ICD-10-CM | POA: Diagnosis not present

## 2023-04-11 DIAGNOSIS — K279 Peptic ulcer, site unspecified, unspecified as acute or chronic, without hemorrhage or perforation: Secondary | ICD-10-CM | POA: Diagnosis not present

## 2023-04-11 DIAGNOSIS — F419 Anxiety disorder, unspecified: Secondary | ICD-10-CM | POA: Diagnosis not present

## 2023-04-11 DIAGNOSIS — F5104 Psychophysiologic insomnia: Secondary | ICD-10-CM | POA: Diagnosis not present

## 2023-04-11 DIAGNOSIS — R262 Difficulty in walking, not elsewhere classified: Secondary | ICD-10-CM | POA: Diagnosis not present

## 2023-04-11 DIAGNOSIS — D696 Thrombocytopenia, unspecified: Secondary | ICD-10-CM | POA: Diagnosis not present

## 2023-04-14 DIAGNOSIS — M170 Bilateral primary osteoarthritis of knee: Secondary | ICD-10-CM | POA: Diagnosis not present

## 2023-07-06 DIAGNOSIS — L308 Other specified dermatitis: Secondary | ICD-10-CM | POA: Diagnosis not present

## 2023-07-06 DIAGNOSIS — Z85820 Personal history of malignant melanoma of skin: Secondary | ICD-10-CM | POA: Diagnosis not present

## 2023-07-06 DIAGNOSIS — X32XXXD Exposure to sunlight, subsequent encounter: Secondary | ICD-10-CM | POA: Diagnosis not present

## 2023-07-06 DIAGNOSIS — Z1283 Encounter for screening for malignant neoplasm of skin: Secondary | ICD-10-CM | POA: Diagnosis not present

## 2023-07-06 DIAGNOSIS — L570 Actinic keratosis: Secondary | ICD-10-CM | POA: Diagnosis not present

## 2023-07-06 DIAGNOSIS — Z08 Encounter for follow-up examination after completed treatment for malignant neoplasm: Secondary | ICD-10-CM | POA: Diagnosis not present

## 2023-07-06 DIAGNOSIS — D225 Melanocytic nevi of trunk: Secondary | ICD-10-CM | POA: Diagnosis not present

## 2023-07-20 DIAGNOSIS — M25562 Pain in left knee: Secondary | ICD-10-CM | POA: Diagnosis not present

## 2023-07-20 DIAGNOSIS — M25561 Pain in right knee: Secondary | ICD-10-CM | POA: Diagnosis not present

## 2023-09-04 ENCOUNTER — Other Ambulatory Visit: Payer: Self-pay

## 2023-09-04 ENCOUNTER — Emergency Department (HOSPITAL_BASED_OUTPATIENT_CLINIC_OR_DEPARTMENT_OTHER)
Admission: EM | Admit: 2023-09-04 | Discharge: 2023-09-04 | Disposition: A | Discharge status: Home / Self Care | Payer: Medicare HMO | Attending: Emergency Medicine | Admitting: Emergency Medicine

## 2023-09-04 DIAGNOSIS — Z7982 Long term (current) use of aspirin: Secondary | ICD-10-CM | POA: Insufficient documentation

## 2023-09-04 DIAGNOSIS — M7022 Olecranon bursitis, left elbow: Secondary | ICD-10-CM | POA: Diagnosis not present

## 2023-09-04 DIAGNOSIS — Y9389 Activity, other specified: Secondary | ICD-10-CM | POA: Insufficient documentation

## 2023-09-04 DIAGNOSIS — R338 Other retention of urine: Secondary | ICD-10-CM | POA: Insufficient documentation

## 2023-09-04 DIAGNOSIS — K5900 Constipation, unspecified: Secondary | ICD-10-CM | POA: Insufficient documentation

## 2023-09-04 DIAGNOSIS — R339 Retention of urine, unspecified: Secondary | ICD-10-CM | POA: Diagnosis not present

## 2023-09-04 LAB — URINALYSIS, ROUTINE W REFLEX MICROSCOPIC
Bacteria, UA: NONE SEEN
Bilirubin Urine: NEGATIVE
Glucose, UA: NEGATIVE mg/dL
Ketones, ur: NEGATIVE mg/dL
Leukocytes,Ua: NEGATIVE
Nitrite: NEGATIVE
Protein, ur: NEGATIVE mg/dL
Specific Gravity, Urine: 1.01 (ref 1.005–1.030)
pH: 5.5 (ref 5.0–8.0)

## 2023-09-04 MED ORDER — FLEET ENEMA RE ENEM
1.0000 | ENEMA | Freq: Once | RECTAL | Status: AC
  Administered 2023-09-04: 1 via RECTAL
  Filled 2023-09-04: qty 1

## 2023-09-04 MED ORDER — POLYETHYLENE GLYCOL 3350 17 G PO PACK: 17.0000 g | PACK | Freq: Every day | ORAL | 0 refills | Status: DC

## 2023-09-04 NOTE — ED Notes (Signed)
Bladder scanned pt x3  409 ml  497 ml  428 ml

## 2023-09-04 NOTE — ED Triage Notes (Signed)
Pt reports inability to urinate or have a bowel movement.  Pt has taken lasix for fluid retention but has not urinated since last night.  LBM yesterday morning - "a small hard amount."  Pt reports lower abdominal and bladder pain/pressure.

## 2023-09-04 NOTE — ED Provider Notes (Signed)
[facility]PART[facility] AT [facility] Provider Note   CSN: [id number] Arrival date & time: 09/04/23  1217     History  Chief Complaint  Patient presents with   Urinary Retention    Scott [name] is a 89 y.o. male.  HPI 89 yo male presents today with urinary retention and constipation.  Patient states that he has not had a bowel movement since Saturday.  He had some swelling on his left elbow so took his Lasix.  He then was unable to urinate.  He also feels constipated and unable to have a bowel movement.  He states that this has taken stool softeners without relief.  He has had urinary retention in the past.  He has been followed by [facility].  He states he has not needed a catheter for a long time.     Home Medications Prior to Admission medications   Medication Sig Start Date End Date Taking? Authorizing Provider  polyethylene glycol (MIRALAX / GLYCOLAX) 17 g packet Take 17 g by mouth daily. 09/04/23  Yes Margarita Grizzle, MD  albuterol (VENTOLIN HFA) 108 (90 Base) MCG/ACT inhaler Inhale 1-2 puffs into the lungs every 6 (six) hours as needed for wheezing or shortness of breath. 11/28/20   Corliss Blacker, MD  aspirin 81 MG tablet Take 81 mg by mouth every evening.    [provider]  BREO ELLIPTA 100-25 MCG/INH AEPB Inhale 1 puff into the lungs daily. 12/23/20   [provider]  EPINEPHrine 0.3 mg/0.3 mL IJ SOAJ injection Inject 0.3 mg into the muscle as needed for anaphylaxis. 11/28/20   Corliss Blacker, MD  fish oil-omega-3 fatty acids 1000 MG capsule Take 1 g by mouth every other day.    [provider]  furosemide (LASIX) 20 MG tablet TAKE 1 TABLET BY MOUTH ONCE EACH MORNING AS NEEDED FOR SWELLING/EDEMA 08/26/19   [provider]  hydrochlorothiazide (HYDRODIURIL) 12.5 MG tablet Take 12.5 mg by mouth every morning. 12/13/19   [provider]  Influenza Vac High-Dose Quad (FLUZONE HIGH-DOSE QUADRIVALENT) 0.7 ML SUSY  Fluzone High-Dose Quad 2020-21 (PF) 240 mcg/0.7 mL IM syringe  PHARMACY ADMINISTERED    [provider]  lovastatin (MEVACOR) 20 MG tablet Take 20 mg by mouth at bedtime.    [provider]  meclizine (ANTIVERT) 12.5 MG tablet Take 1 tablet (12.5 mg total) by mouth 3 (three) times daily as needed for dizziness. 01/04/23   Charlynne Pander, MD  metoprolol (LOPRESSOR) 50 MG tablet Take 25 mg by mouth 2 (two) times daily.    [provider]  pantoprazole (PROTONIX) 20 MG tablet Take 1 tablet (20 mg total) by mouth daily. 04/05/23   Ronney Asters, NP  potassium chloride (K-DUR,KLOR-CON) 10 MEQ tablet Take 10 mEq by mouth every evening.     [provider]  potassium chloride (KLOR-CON) 10 MEQ tablet potassium chloride ER 10 mEq tablet,extended release  TK 1 T PO QD    [provider]  telmisartan (MICARDIS) 40 MG tablet Take 40 mg by mouth daily. 08/14/19   [provider]  traZODone (DESYREL) 50 MG tablet Take 1 tablet (50 mg total) by mouth at bedtime. Patient not taking: Reported on 04/05/2023 12/03/14   Jetty Duhamel D, MD  triazolam (HALCION) 0.25 MG tablet TAKE 1 TABLET (0.25 MG TOTAL) BY MOUTH AT BEDTIME AS NEEDED FOR SLEEP. Patient not taking: Reported on 04/05/2023 05/26/21   Waymon Budge, MD  vitamin E 400 UNIT capsule  Take 400 Units by mouth daily.     [provider]      Allergies    Acetaminophen, Amlodipine, Bactrim [sulfamethoxazole-trimethoprim], Codeine, Fexofenadine, Ibuprofen, Lovenox [enoxaparin sodium], Naproxen, Prednisone, Shellfish allergy, and Sonata [zaleplon]    Review of Systems   Review of Systems  Physical Exam Updated Vital Signs BP (!) 147/73 (BP Location: Left Arm)   Pulse 68   Temp 98 F (36.7 C)   Resp 17   SpO2 96%  Physical Exam Vitals reviewed.  HENT:     Head: Normocephalic.     Right Ear: External ear normal.     Left Ear: External ear normal.     Nose: Nose normal.      Mouth/Throat:     Pharynx: Oropharynx is clear.  Eyes:     Pupils: Pupils are equal, round, and reactive to light.  Cardiovascular:     Rate and Rhythm: Normal rate.     Pulses: Normal pulses.  Pulmonary:     Effort: Pulmonary effort is normal.  Abdominal:     General: Abdomen is flat.     Palpations: Abdomen is soft.  Genitourinary:    Penis: Normal.      Comments: Fecal impaction with soft stool instant disimpacted as able Musculoskeletal:        General: Normal range of motion.     Cervical back: Normal range of motion.     Comments: Following over olecranon left elbow no redness or tenderness flocked range of motion  Skin:    General: Skin is warm and dry.     Capillary Refill: Capillary refill takes less than 2 seconds.  Neurological:     General: No focal deficit present.     Mental Status: He is alert.  Psychiatric:        Mood and Affect: Mood normal.     ED Results / Procedures / Treatments   Labs (all labs ordered are listed, but only abnormal results are displayed) Labs Reviewed  URINALYSIS, ROUTINE W REFLEX MICROSCOPIC - Abnormal; Notable for the following components:      Result Value   Hgb urine dipstick SMALL (*)    All other components within normal limits    EKG None  Radiology No results found.  Procedures Procedures    Medications Ordered in ED Medications  sodium phosphate (FLEET) enema 1 enema (1 enema Rectal Given 09/04/23 1512)    ED Course/ Medical Decision Making/ A&P                                 Medical Decision Making Amount and/or Complexity of Data Reviewed Labs: ordered.   89 year old male presents today with acute urinary retention and the patient will.  Patient has olecranon bursitis does not appear to be infectious.  Secondary to this he took Lasix for the swelling.  He then was unable to void.  He presents today with acute urinary retention and had Foley catheter placed with good urine output. Attempted manual  disimpaction.  Patient will be discharged on fleets enema and miralax. Patient advised to follow-up with urology for further Foley catheter management       Final Clinical Impression(s) / ED Diagnoses Final diagnoses:  Acute urinary retention  Constipation, unspecified constipation type    Rx / DC Orders ED Discharge Orders          Ordered    polyethylene glycol (MIRALAX / GLYCOLAX)  17 g packet  Daily        09/04/23 1604              Margarita Grizzle, MD 09/04/23 1606

## 2023-09-04 NOTE — Discharge Instructions (Signed)
Please call [facility] for appointment for recheck of the catheter and possible removal Please use stool softener and MiraLAX as needed for the next several days to have normal bowel movements Do not use Lasix.  Swelling of your left elbow is likely secondary to some irritation.  There is no evidence of infection does not need any further treatment at this time. Return to the emergency department if you are having any new or worsening symptoms

## 2023-09-04 NOTE — ED Notes (Signed)
 RN reviewed discharge instructions with pt. Pt verbalized understanding and had no further questions. VSS upon discharge.  

## 2023-09-19 ENCOUNTER — Ambulatory Visit: Payer: Medicare HMO | Admitting: Urology

## 2023-09-19 ENCOUNTER — Encounter: Payer: Self-pay | Admitting: Urology

## 2023-09-19 VITALS — BP 149/83 | HR 80

## 2023-09-19 DIAGNOSIS — R339 Retention of urine, unspecified: Secondary | ICD-10-CM

## 2023-09-19 DIAGNOSIS — N401 Enlarged prostate with lower urinary tract symptoms: Secondary | ICD-10-CM | POA: Diagnosis not present

## 2023-09-19 DIAGNOSIS — N138 Other obstructive and reflux uropathy: Secondary | ICD-10-CM | POA: Diagnosis not present

## 2023-09-19 MED ORDER — CIPROFLOXACIN HCL 500 MG PO TABS
500.0000 mg | ORAL_TABLET | Freq: Once | ORAL | Status: AC
  Administered 2023-09-19: 500 mg via ORAL

## 2023-09-19 NOTE — Addendum Note (Signed)
Addended by: Lizbeth Bark on: 09/19/2023 01:19 PM   Modules accepted: Orders

## 2023-09-19 NOTE — Progress Notes (Signed)
Fill and Pull Catheter Removal  Patient is present today for a catheter removal.  Patient was cleaned and prepped in a sterile fashion of sterile water was instilled into the bladder when the patient felt the urge to urinate, 9mL of water was then drained from the balloon.  A 16FR foley cath was removed from the bladder no complications were noted .  Patient was then given some time to void on their own.  Patient can void  180 ml on their own after some time.  Patient tolerated well.  Performed by: Arville Go CMA

## 2023-09-19 NOTE — Progress Notes (Signed)
Assessment: 1. Urinary retention   2. BPH with obstruction/lower urinary tract symptoms     Plan: I personally reviewed the patient's chart including provider notes, and lab results. Foley removed today after successful voiding trial Continued management of constipation Cipro x 1 following foley removal Return to office in 7-10 days with bladder scan Patient asked to return to the office later today if he has any difficulty voiding.  Chief Complaint:  Chief Complaint  Patient presents with   Urinary Retention    History of Present Illness:  Scott [name] is a 89 y.o. male who is seen for evaluation of urinary retention. He presented to the emergency room on 09/04/2023 with inability to void.  He was having ongoing constipation.  He also took some Lasix due to some swelling.  He was found to have approximately 500 ml of urine in his bladder at the time of catheter placement. He is not currently taking an alpha-blocker. His catheter has been draining well.  No gross hematuria.  He reports that he is having regular bowel movements at this time.  He has a prior history of urinary retention.  He was previously managed with tamsulosin and finasteride.  Past Medical History:  Past Medical History:  Diagnosis Date   Anemia    Acute blood loss anemia secondary to surgery, followed fr/ Hip replacement    Anxiety    pt. reports that he has occas. nervous episodes    BPH (benign prostatic hyperplasia)    CAD (coronary artery disease)    s/p CABG in 2007  CARDILOLOGIST IS DR. P. [place]   Cancer (HCC)    melanoma - removed L ear - 2010   Cranial neuropathy    resolved    Generalized OA    GERD (gastroesophageal reflux disease)    H/O echocardiogram    last echo, stress test 05/2012- pt. followed by [facility]    Hypercholesterolemia    Hypertension    Obesity    Osteoarthritis    End-stage osteoarthritis, right hip   Sleep apnea    with use of CPAP   SOB (shortness of breath)     WITH EXERTION   Thrombocytopenia (HCC)    possibly due to Lovenox    Past Surgical History:  Past Surgical History:  Procedure Laterality Date   ACHILLES TENDON SURGERY  10/06/2012   Procedure: ACHILLES TENDON REPAIR;  Surgeon: Thera Flake., MD;  Location: [facility];  Service: Orthopedics;  Laterality: Right;  REPAIR RIGHT RUPTURE ACHILLES TENDON PRIMARY OPEN/PERCUTANEOUS, EXCISION PARTIAL BONE TALUS/CANCANEUS, REPAIR PARTIAL EXCISION CALCANEUS   APPENDECTOMY     ARTERY BIOPSY     temporal - L side- wnl, double vision treated /w prednisone - 1991   CALCANEAL OSTEOTOMY  10/06/2012   Procedure: CALCANEAL OSTEOTOMY;  Surgeon: Thera Flake., MD;  Location: [facility];  Service: Orthopedics;  Laterality: Right;   CARDIAC CATHETERIZATION  12/15/2005   NORMAL. EF 60%, angioplasty 1995   carpel tunnel surgery-bilateral     both hands    CORONARY ARTERY BYPASS GRAFT  2007   LIMA GRAFT TO THE LAD, SAPHENOUS VEIN GRAFT TO THE FIRST OBTUSE MARIGNAL, SAPHENOUS VEIN GRAFT TO THE SECOND OBTUSE MARGINAL AND A SAPHENOUS VEIN GRAFT TO THE ACUTE MARGINAL AND POSTERIOR LATERAL BRANCH   HERNIA REPAIR     I & D EXTREMITY  12/11/2012   Procedure: IRRIGATION AND DEBRIDEMENT EXTREMITY;  Surgeon: Nadara Mustard, MD;  Location: [facility];  Service: Orthopedics;  Laterality: Right;  Irrigation and Debridement achilles, wound closure, apply wound vac, place antibiotic beads   INGUINAL HERNIA REPAIR Left 05/10/2013   Procedure: HERNIA REPAIR INGUINAL ADULT;  Surgeon: Velora Heckler, MD;  Location: [facility];  Service: General;  Laterality: Left;   INSERTION OF MESH Left 05/10/2013   Procedure: INSERTION OF MESH;  Surgeon: Velora Heckler, MD;  Location: [facility];  Service: General;  Laterality: Left;   JOINT REPLACEMENT     knee  11/12   left arthroscopic   TOTAL HIP ARTHROPLASTY     right    Allergies:  Allergies  Allergen Reactions   Acetaminophen     Other reaction(s): mouth sores   Amlodipine Swelling    Swelling of feet  and legs   Bactrim [Sulfamethoxazole-Trimethoprim] Other (See Comments)    Constipation    Codeine Nausea Only   Fexofenadine     Other reaction(s): mouth sores   Ibuprofen     Other reaction(s): nervousness   Lovenox [Enoxaparin Sodium] Other (See Comments)    MAY HAVE CAUSED THROMBOCYTOPENIA   Naproxen     Other reaction(s): mouth sores   Prednisone Swelling    Patient has tolerated doses for colds. Thinks it caused swelling in the past   Shellfish Allergy Nausea And Vomiting and Other (See Comments)    N&V, shaking   Sonata [Zaleplon] Other (See Comments)    Hyper feelings    Family History:  Family History  Problem Relation Age of Onset   Prostate cancer Brother    Heart disease Brother    Heart disease Brother     Social History:  Social History   Tobacco Use   Smoking status: Never   Smokeless tobacco: Never  Vaping Use   Vaping status: Never Used  Substance Use Topics   Alcohol use: No    Alcohol/week: 0.0 standard drinks of alcohol   Drug use: No    Review of symptoms:  Constitutional:  Negative for unexplained weight loss, night sweats, fever, chills ENT:  Negative for nose bleeds, sinus pain, painful swallowing CV:  Negative for chest pain, shortness of breath, exercise intolerance, palpitations, loss of consciousness Resp:  Negative for cough, wheezing, shortness of breath GI:  Negative for nausea, vomiting, diarrhea, bloody stools GU:  Positives noted in HPI; otherwise negative for gross hematuria, dysuria, urinary incontinence Neuro:  Negative for seizures, poor balance, limb weakness, slurred speech Psych:  Negative for lack of energy, depression, anxiety Endocrine:  Negative for polydipsia, polyuria, symptoms of hypoglycemia (dizziness, hunger, sweating) Hematologic:  Negative for anemia, purpura, petechia, prolonged or excessive bleeding, use of anticoagulants  Allergic:  Negative for difficulty breathing or choking as a result of exposure to  anything; no shellfish allergy; no allergic response (rash/itch) to materials, foods  Physical exam: BP (!) 149/83   Pulse 80  GENERAL APPEARANCE:  Well appearing, well developed, well nourished, NAD HEENT: Atraumatic, Normocephalic, oropharynx clear. NECK: Supple without lymphadenopathy or thyromegaly. LUNGS: Clear to auscultation bilaterally. HEART: Regular Rate and Rhythm without murmurs, gallops, or rubs. ABDOMEN: Soft, non-tender, No Masses. EXTREMITIES: Moves all extremities well.  Without clubbing, cyanosis, or edema. NEUROLOGIC:  Alert and oriented x 3, normal gait, CN II-XII grossly intact.  MENTAL STATUS:  Appropriate. BACK:  Non-tender to palpation.  No CVAT SKIN:  Warm, dry and intact.   GU:  foley cath in place draining yellow urine; uncircumcised phallus  Results: None  Procedure:  VOIDING TRIAL  A voiding trial was performed  in the office today.   Volume of sterile water instilled: 220 mL Foley catheter removed intact. Volume voided by patient: 180 mL Instructed to return to office if has not voided by 4 PM

## 2023-09-20 DIAGNOSIS — Z0000 Encounter for general adult medical examination without abnormal findings: Secondary | ICD-10-CM | POA: Diagnosis not present

## 2023-09-20 DIAGNOSIS — M7022 Olecranon bursitis, left elbow: Secondary | ICD-10-CM | POA: Diagnosis not present

## 2023-09-20 DIAGNOSIS — Z9181 History of falling: Secondary | ICD-10-CM | POA: Diagnosis not present

## 2023-09-20 DIAGNOSIS — J019 Acute sinusitis, unspecified: Secondary | ICD-10-CM | POA: Diagnosis not present

## 2023-09-28 ENCOUNTER — Ambulatory Visit: Payer: Medicare HMO | Admitting: Urology

## 2023-09-28 ENCOUNTER — Encounter: Payer: Self-pay | Admitting: Urology

## 2023-09-28 VITALS — BP 175/80 | HR 68 | Ht 66.0 in | Wt 178.0 lb

## 2023-09-28 DIAGNOSIS — N401 Enlarged prostate with lower urinary tract symptoms: Secondary | ICD-10-CM

## 2023-09-28 DIAGNOSIS — Z87898 Personal history of other specified conditions: Secondary | ICD-10-CM

## 2023-09-28 DIAGNOSIS — N138 Other obstructive and reflux uropathy: Secondary | ICD-10-CM | POA: Diagnosis not present

## 2023-09-28 DIAGNOSIS — R339 Retention of urine, unspecified: Secondary | ICD-10-CM

## 2023-09-28 LAB — BLADDER SCAN AMB NON-IMAGING

## 2023-09-28 MED ORDER — TAMSULOSIN HCL 0.4 MG PO CAPS: 0.4000 mg | ORAL_CAPSULE | Freq: Every day | ORAL | 5 refills | Status: DC

## 2023-09-28 NOTE — Progress Notes (Signed)
Assessment: 1. History of urinary retention   2. BPH with obstruction/lower urinary tract symptoms    Plan: He appears to be emptying his bladder completely. I discussed retrying tamsulosin 0.4 mg nightly to see if this may improve his current lower urinary tract symptoms. Prescription for tamsulosin sent to his pharmacy. He would like to contact the office with results of the medication rather than schedule a follow-up visit. Return to office as needed.  Chief Complaint:  Chief Complaint  Patient presents with   Urinary Retention    History of Present Illness:  Scott [name] is a 89 y.o. male who is seen for further evaluation of urinary retention. He presented to the emergency room on 09/04/2023 with inability to void.  He was having ongoing constipation.  He also took some Lasix due to some swelling.  He was found to have approximately 500 ml of urine in his bladder at the time of catheter placement. He was not taking an alpha-blocker. His catheter was removed after successful voiding trial on 09/19/23.  He has a prior history of urinary retention.  He was previously managed with tamsulosin and finasteride.  He returns today for follow-up.  He has been voiding spontaneously since the catheter was removed.  He has noted gradual improvement in his urine stream.  No dysuria or gross hematuria.  He is having nocturia 4-5 times.  Portions of the above documentation were copied from a prior visit for review purposes only.   Past Medical History:  Past Medical History:  Diagnosis Date   Anemia    Acute blood loss anemia secondary to surgery, followed fr/ Hip replacement    Anxiety    pt. reports that he has occas. nervous episodes    BPH (benign prostatic hyperplasia)    CAD (coronary artery disease)    s/p CABG in 2007  CARDILOLOGIST IS DR. P. [place]   Cancer (HCC)    melanoma - removed L ear - 2010   Cranial neuropathy    resolved    Generalized OA    GERD  (gastroesophageal reflux disease)    H/O echocardiogram    last echo, stress test 05/2012- pt. followed by [facility]    Hypercholesterolemia    Hypertension    Obesity    Osteoarthritis    End-stage osteoarthritis, right hip   Sleep apnea    with use of CPAP   SOB (shortness of breath)    WITH EXERTION   Thrombocytopenia (HCC)    possibly due to Lovenox    Past Surgical History:  Past Surgical History:  Procedure Laterality Date   ACHILLES TENDON SURGERY  10/06/2012   Procedure: ACHILLES TENDON REPAIR;  Surgeon: Thera Flake., MD;  Location: [facility];  Service: Orthopedics;  Laterality: Right;  REPAIR RIGHT RUPTURE ACHILLES TENDON PRIMARY OPEN/PERCUTANEOUS, EXCISION PARTIAL BONE TALUS/CANCANEUS, REPAIR PARTIAL EXCISION CALCANEUS   APPENDECTOMY     ARTERY BIOPSY     temporal - L side- wnl, double vision treated /w prednisone - 1991   CALCANEAL OSTEOTOMY  10/06/2012   Procedure: CALCANEAL OSTEOTOMY;  Surgeon: Thera Flake., MD;  Location: [facility];  Service: Orthopedics;  Laterality: Right;   CARDIAC CATHETERIZATION  12/15/2005   NORMAL. EF 60%, angioplasty 1995   carpel tunnel surgery-bilateral     both hands    CORONARY ARTERY BYPASS GRAFT  2007   LIMA GRAFT TO THE LAD, SAPHENOUS VEIN GRAFT TO THE FIRST OBTUSE MARIGNAL, SAPHENOUS VEIN GRAFT TO THE SECOND OBTUSE  MARGINAL AND A SAPHENOUS VEIN GRAFT TO THE ACUTE MARGINAL AND POSTERIOR LATERAL BRANCH   HERNIA REPAIR     I & D EXTREMITY  12/11/2012   Procedure: IRRIGATION AND DEBRIDEMENT EXTREMITY;  Surgeon: Nadara Mustard, MD;  Location: [facility];  Service: Orthopedics;  Laterality: Right;  Irrigation and Debridement achilles, wound closure, apply wound vac, place antibiotic beads   INGUINAL HERNIA REPAIR Left 05/10/2013   Procedure: HERNIA REPAIR INGUINAL ADULT;  Surgeon: Velora Heckler, MD;  Location: [facility];  Service: General;  Laterality: Left;   INSERTION OF MESH Left 05/10/2013   Procedure: INSERTION OF MESH;  Surgeon: Velora Heckler, MD;   Location: [facility];  Service: General;  Laterality: Left;   JOINT REPLACEMENT     knee  11/12   left arthroscopic   TOTAL HIP ARTHROPLASTY     right    Allergies:  Allergies  Allergen Reactions   Acetaminophen     Other reaction(s): mouth sores   Amlodipine Swelling    Swelling of feet and legs   Bactrim [Sulfamethoxazole-Trimethoprim] Other (See Comments)    Constipation    Codeine Nausea Only   Fexofenadine     Other reaction(s): mouth sores   Ibuprofen     Other reaction(s): nervousness   Lovenox [Enoxaparin Sodium] Other (See Comments)    MAY HAVE CAUSED THROMBOCYTOPENIA   Naproxen     Other reaction(s): mouth sores   Prednisone Swelling    Patient has tolerated doses for colds. Thinks it caused swelling in the past   Shellfish Allergy Nausea And Vomiting and Other (See Comments)    N&V, shaking   Sonata [Zaleplon] Other (See Comments)    Hyper feelings    Family History:  Family History  Problem Relation Age of Onset   Prostate cancer Brother    Heart disease Brother    Heart disease Brother     Social History:  Social History   Tobacco Use   Smoking status: Never   Smokeless tobacco: Never  Vaping Use   Vaping status: Never Used  Substance Use Topics   Alcohol use: No    Alcohol/week: 0.0 standard drinks of alcohol   Drug use: No    ROS: Constitutional:  Negative for fever, chills, weight loss CV: Negative for chest pain, previous MI, hypertension Respiratory:  Negative for shortness of breath, wheezing, sleep apnea, frequent cough GI:  Negative for nausea, vomiting, bloody stool, GERD  Physical exam: BP (!) 175/80   Pulse 68   Ht 5\' 6"  (1.676 m)   Wt 178 lb (80.7 kg)   BMI 28.73 kg/m  GENERAL APPEARANCE:  Well appearing, well developed, well nourished, NAD HEENT:  Atraumatic, normocephalic, oropharynx clear NECK:  Supple without lymphadenopathy or thyromegaly ABDOMEN:  Soft, non-tender, no masses EXTREMITIES:  Moves all extremities well,  without clubbing, cyanosis, or edema NEUROLOGIC:  Alert and oriented x 3, normal gait, CN II-XII grossly intact MENTAL STATUS:  appropriate BACK:  Non-tender to palpation, No CVAT SKIN:  Warm, dry, and intact  Results: U/A: 0 WBC, 0-2 RBC  PVR:  11 ml

## 2023-09-29 LAB — URINALYSIS, ROUTINE W REFLEX MICROSCOPIC
Bilirubin, UA: NEGATIVE
Glucose, UA: NEGATIVE
Ketones, UA: NEGATIVE
Leukocytes,UA: NEGATIVE
Nitrite, UA: NEGATIVE
Specific Gravity, UA: 1.02 (ref 1.005–1.030)
Urobilinogen, Ur: 0.2 mg/dL (ref 0.2–1.0)
pH, UA: 6.5 (ref 5.0–7.5)

## 2023-09-29 LAB — MICROSCOPIC EXAMINATION

## 2023-10-10 ENCOUNTER — Ambulatory Visit: Payer: Medicare HMO | Attending: Cardiology | Admitting: Cardiology

## 2023-10-11 ENCOUNTER — Encounter: Payer: Self-pay | Admitting: Cardiology

## 2023-10-12 DIAGNOSIS — J4520 Mild intermittent asthma, uncomplicated: Secondary | ICD-10-CM | POA: Diagnosis not present

## 2023-10-12 DIAGNOSIS — I2510 Atherosclerotic heart disease of native coronary artery without angina pectoris: Secondary | ICD-10-CM | POA: Diagnosis not present

## 2023-10-12 DIAGNOSIS — F5104 Psychophysiologic insomnia: Secondary | ICD-10-CM | POA: Diagnosis not present

## 2023-10-12 DIAGNOSIS — Z9989 Dependence on other enabling machines and devices: Secondary | ICD-10-CM | POA: Diagnosis not present

## 2023-10-12 DIAGNOSIS — E7800 Pure hypercholesterolemia, unspecified: Secondary | ICD-10-CM | POA: Diagnosis not present

## 2023-10-12 DIAGNOSIS — R262 Difficulty in walking, not elsewhere classified: Secondary | ICD-10-CM | POA: Diagnosis not present

## 2023-10-12 DIAGNOSIS — F419 Anxiety disorder, unspecified: Secondary | ICD-10-CM | POA: Diagnosis not present

## 2023-10-12 DIAGNOSIS — I10 Essential (primary) hypertension: Secondary | ICD-10-CM | POA: Diagnosis not present

## 2023-10-23 NOTE — Progress Notes (Unsigned)
Cardiology Clinic Note   Patient Name: Scott [name] Date of Encounter: 10/26/2023  Primary Care Provider:  [facility] Primary Cardiologist:  Peter Swaziland, MD  Patient Profile    Scott [name] 89 year old male presents to the clinic today for follow-up evaluation of his coronary artery disease and essential hypertension.  Past Medical History    Past Medical History:  Diagnosis Date   Anemia    Acute blood loss anemia secondary to surgery, followed fr/ Hip replacement    Anxiety    pt. reports that he has occas. nervous episodes    BPH (benign prostatic hyperplasia)    CAD (coronary artery disease)    s/p CABG in 2007  CARDILOLOGIST IS DR. P. Swaziland   Cancer (HCC)    melanoma - removed L ear - 2010   Cranial neuropathy    resolved    Generalized OA    GERD (gastroesophageal reflux disease)    H/O echocardiogram    last echo, stress test 05/2012- pt. followed by     Hypercholesterolemia    Hypertension    Obesity    Osteoarthritis    End-stage osteoarthritis, right hip   Sleep apnea    with use of CPAP   SOB (shortness of breath)    WITH EXERTION   Thrombocytopenia (HCC)    possibly due to Lovenox   Past Surgical History:  Procedure Laterality Date   ACHILLES TENDON SURGERY  10/06/2012   Procedure: ACHILLES TENDON REPAIR;  Surgeon: Thera Flake., MD;  Location: [facility] OR;  Service: Orthopedics;  Laterality: Right;  REPAIR RIGHT RUPTURE ACHILLES TENDON PRIMARY OPEN/PERCUTANEOUS, EXCISION PARTIAL BONE TALUS/CANCANEUS, REPAIR PARTIAL EXCISION CALCANEUS   APPENDECTOMY     ARTERY BIOPSY     temporal - L side- wnl, double vision treated /w prednisone - 1991   CALCANEAL OSTEOTOMY  10/06/2012   Procedure: CALCANEAL OSTEOTOMY;  Surgeon: Thera Flake., MD;  Location: [facility] OR;  Service: Orthopedics;  Laterality: Right;   CARDIAC CATHETERIZATION  12/15/2005   NORMAL. EF 60%, angioplasty 1995   carpel tunnel surgery-bilateral     both hands     CORONARY ARTERY BYPASS GRAFT  2007   LIMA GRAFT TO THE LAD, SAPHENOUS VEIN GRAFT TO THE FIRST OBTUSE MARIGNAL, SAPHENOUS VEIN GRAFT TO THE SECOND OBTUSE MARGINAL AND A SAPHENOUS VEIN GRAFT TO THE ACUTE MARGINAL AND POSTERIOR LATERAL BRANCH   HERNIA REPAIR     I & D EXTREMITY  12/11/2012   Procedure: IRRIGATION AND DEBRIDEMENT EXTREMITY;  Surgeon: Nadara Mustard, MD;  Location: [facility] OR;  Service: Orthopedics;  Laterality: Right;  Irrigation and Debridement achilles, wound closure, apply wound vac, place antibiotic beads   INGUINAL HERNIA REPAIR Left 05/10/2013   Procedure: HERNIA REPAIR INGUINAL ADULT;  Surgeon: Velora Heckler, MD;  Location: [facility] ORS;  Service: General;  Laterality: Left;   INSERTION OF MESH Left 05/10/2013   Procedure: INSERTION OF MESH;  Surgeon: Velora Heckler, MD;  Location: [facility];  Service: General;  Laterality: Left;   JOINT REPLACEMENT     knee  11/12   left arthroscopic   TOTAL HIP ARTHROPLASTY     right    Allergies  Allergies  Allergen Reactions   Acetaminophen     Other reaction(s): mouth sores   Amlodipine Swelling    Swelling of feet and legs   Bactrim [Sulfamethoxazole-Trimethoprim] Other (See Comments)    Constipation    Codeine Nausea Only  Fexofenadine     Other reaction(s): mouth sores   Ibuprofen     Other reaction(s): nervousness   Lovenox [Enoxaparin Sodium] Other (See Comments)    MAY HAVE CAUSED THROMBOCYTOPENIA   Naproxen     Other reaction(s): mouth sores   Prednisone Swelling    Patient has tolerated doses for colds. Thinks it caused swelling in the past   Shellfish Allergy Nausea And Vomiting and Other (See Comments)    N&V, shaking   Sonata [Zaleplon] Other (See Comments)    Hyper feelings    History of Present Illness    Chuong Vonbank is a PMH of coronary artery disease status post CABG 2007, HLD, HTN, GERD, obesity, osteoarthritis, and dizziness.  Stress testing 2010 was negative.  Echocardiogram 2020 normal EF showed G2 DD and  mild pulmonary hypertension.  He was seen in follow-up by Dr. Swaziland on 01/26/2022.  During that time he remained stable from a cardiac standpoint.  He denies chest pain or dyspnea.  He was getting around well.  He denied lower extremity swelling except for in his left ankle.  He was taking Lasix as needed.  He presented to the emergency department on 01/04/2023.  He reported dizziness.  His lab work was unremarkable.  His head CT and brain MRI were unremarkable.  He was instructed to follow-up with neurology.  He was prescribed as needed meclizine.  His EKG at that time showed sinus rhythm with first-degree AV block 61 bpm  He presented the clinic 04/05/23 for follow-up evaluation and stated he continued to do fairly well.  He had been walking to see his wife who was in memory care unit nursing facility.  He reported that it became too difficult for him to care for her and her colostomy bag.  He had not been to see her that day but was going several times through the week.  His blood pressure was well-controlled.  He denied chest pain or shortness of breath.  I gave him the salty 6 diet sheet and planned follow-up in 8 months.  He was seen in the emergency department on 09/04/2023 for acute urinary retention and constipation.  He was instructed to follow-up with urology.  He presents to the clinic today for follow-up evaluation and states he was less active while he had catheter in place.  After having urinary catheter he had a upper respiratory infection and was not very active.  He presents to the clinic today with his son.  His son reports that he is slowly increasing his physical activity and has been walking more.  We reviewed his medications and previous cardiology visit.  They expressed understanding.  He continues to have left greater than right generalized bilateral lower extremity swelling.  He has been taking furosemide as needed.  I will have him maintain his physical activity, continue low-sodium  diet, continue his current medication regimen and plan follow-up in 6 to 8 months.  Today he denies chest pain, shortness of breath, lower extremity edema, fatigue, palpitations, melena, hematuria, hemoptysis, diaphoresis, weakness, presyncope, syncope, orthopnea, and PND.   Home Medications    Prior to Admission medications   Medication Sig Start Date End Date Taking? Authorizing Provider  albuterol (VENTOLIN HFA) 108 (90 Base) MCG/ACT inhaler Inhale 1-2 puffs into the lungs every 6 (six) hours as needed for wheezing or shortness of breath. 11/28/20   Corliss Blacker, MD  aspirin 81 MG tablet Take 81 mg by mouth every evening.    [provider]  BREO ELLIPTA 100-25 MCG/INH AEPB Inhale 1 puff into the lungs daily. 12/23/20   [provider]  EPINEPHrine 0.3 mg/0.3 mL IJ SOAJ injection Inject 0.3 mg into the muscle as needed for anaphylaxis. 11/28/20   Corliss Blacker, MD  fish oil-omega-3 fatty acids 1000 MG capsule Take 1 g by mouth every other day. Patient not taking: Reported on 01/06/2023    [provider]  furosemide (LASIX) 20 MG tablet TAKE 1 TABLET BY MOUTH ONCE EACH MORNING AS NEEDED FOR SWELLING/EDEMA 08/26/19   [provider]  hydrochlorothiazide (HYDRODIURIL) 12.5 MG tablet Take 12.5 mg by mouth every morning. 12/13/19   [provider]  Influenza Vac High-Dose Quad (FLUZONE HIGH-DOSE QUADRIVALENT) 0.7 ML SUSY Fluzone High-Dose Quad 2020-21 (PF) 240 mcg/0.7 mL IM syringe  PHARMACY ADMINISTERED    [provider]  lovastatin (MEVACOR) 20 MG tablet Take 20 mg by mouth at bedtime.    [provider]  meclizine (ANTIVERT) 12.5 MG tablet Take 1 tablet (12.5 mg total) by mouth 3 (three) times daily as needed for dizziness. 01/04/23   Charlynne Pander, MD  methylPREDNISolone (MEDROL DOSEPAK) 4 MG TBPK tablet As directed 05/16/20   [place], Peter M, MD  metoprolol (LOPRESSOR) 50 MG tablet Take 25 mg by mouth 2 (two) times daily.     [provider]  potassium chloride (K-DUR,KLOR-CON) 10 MEQ tablet Take 10 mEq by mouth every evening.     [provider]  potassium chloride (KLOR-CON) 10 MEQ tablet potassium chloride ER 10 mEq tablet,extended release  TK 1 T PO QD    [provider]  telmisartan (MICARDIS) 40 MG tablet Take 40 mg by mouth daily. 08/14/19   [provider]  traZODone (DESYREL) 50 MG tablet Take 1 tablet (50 mg total) by mouth at bedtime. 12/03/14   Young, Joni Fears D, MD  triazolam (HALCION) 0.25 MG tablet TAKE 1 TABLET (0.25 MG TOTAL) BY MOUTH AT BEDTIME AS NEEDED FOR SLEEP. Patient not taking: Reported on 01/06/2023 05/26/21   Waymon Budge, MD  vitamin E 400 UNIT capsule Take 400 Units by mouth daily.     [provider]    Family History    Family History  Problem Relation Age of Onset   Prostate cancer Brother    Heart disease Brother    Heart disease Brother    He indicated that his mother is deceased. He indicated that his father is deceased. He indicated that both of his sisters are alive. He indicated that all of his three brothers are deceased. He indicated that his maternal grandmother is deceased. He indicated that his maternal grandfather is deceased. He indicated that his paternal grandmother is deceased. He indicated that his paternal grandfather is deceased.  Social History    Social History   Socioeconomic History   Marital status: Married    Spouse name: Not on file   Number of children: 2   Years of education: Not on file   Highest education level: Not on file  Occupational History   Occupation: [employer]    Comment: retired  Tobacco Use   Smoking status: Never   Smokeless tobacco: Never  Vaping Use   Vaping status: Never Used  Substance and Sexual Activity   Alcohol use: No    Alcohol/week: 0.0 standard drinks of alcohol   Drug use: No   Sexual activity: Not Currently  Other Topics Concern   Not on file  Social  History Narrative  Not on file   Social Determinants of Health   Financial Resource Strain: Not on file  Food Insecurity: Not on file  Transportation Needs: Not on file  Physical Activity: Not on file  Stress: Not on file  Social Connections: Not on file  Intimate Partner Violence: Not on file     Review of Systems    General:  No chills, fever, night sweats or weight changes.  Cardiovascular:  No chest pain, dyspnea on exertion, edema, orthopnea, palpitations, paroxysmal nocturnal dyspnea. Dermatological: No rash, lesions/masses Respiratory: No cough, dyspnea Urologic: No hematuria, dysuria Abdominal:   No nausea, vomiting, diarrhea, bright red blood per rectum, melena, or hematemesis Neurologic:  No visual changes, wkns, changes in mental status. All other systems reviewed and are otherwise negative except as noted above.  Physical Exam    VS:  BP (!) 114/58 (BP Location: Left Arm, Patient Position: Sitting, Cuff Size: Normal)   Pulse 69   Ht 5\' 5"  (1.651 m)   Wt 187 lb (84.8 kg)   SpO2 93%   BMI 31.12 kg/m  , BMI Body mass index is 31.12 kg/m. GEN: Well nourished, well developed, in no acute distress. HEENT: normal. Neck: Supple, no JVD, carotid bruits, or masses. Cardiac: RRR, no murmurs, rubs, or gallops. No clubbing, cyanosis, generalized bilateral lower extremity nonpitting edema left greater than right.  Radials/DP/PT 2+ and equal bilaterally.  Respiratory:  Respirations regular and unlabored, clear to auscultation bilaterally. GI: Soft, nontender, nondistended, BS + x 4. MS: no deformity or atrophy. Skin: warm and dry, no rash. Neuro:  Strength and sensation are intact. Psych: Normal affect.  Accessory Clinical Findings    Recent Labs: 01/04/2023: ALT 18; BUN 16; Creatinine, Ser 1.06; Hemoglobin 13.7; Platelets 134; Potassium 3.4; Sodium 139   Recent Lipid Panel    Component Value Date/Time   CHOL 152 05/17/2011 0826   TRIG 56 04/17/2019 0455   HDL  40.30 05/17/2011 0826   CHOLHDL 4 05/17/2011 0826   VLDL 29.6 05/17/2011 0826   LDLCALC 82 05/17/2011 0826         ECG personally reviewed by me today-none today.  Echocardiogram 09/10/2019 IMPRESSIONS     1. Left ventricular ejection fraction, by visual estimation, is 60 to  65%. The left ventricle has normal function. Normal left ventricular size.  There is mildly increased left ventricular hypertrophy.   2. Left ventricular diastolic Doppler parameters are consistent with  impaired relaxation pattern of LV diastolic filling.   3. Global right ventricle has normal systolic function.The right  ventricular size is mildly enlarged. No increase in right ventricular wall  thickness.   4. Left atrial size was normal.   5. Right atrial size was normal.   6. The mitral valve is normal in structure. Mild mitral valve  regurgitation. No evidence of mitral stenosis.   7. The tricuspid valve is normal in structure. Tricuspid valve  regurgitation is trivial.   8. The aortic valve is normal in structure. Aortic valve regurgitation  was not visualized by color flow Doppler. Structurally normal aortic  valve, with no evidence of sclerosis or stenosis.   9. The pulmonic valve was normal in structure. Pulmonic valve  regurgitation is trivial by color flow Doppler.  10. There is mild dilatation of the aortic root measuring 42 mm.  11. Mildly elevated pulmonary artery systolic pressure.  12. The tricuspid regurgitant velocity is 2.71 m/s, and with an assumed  right atrial pressure of 8 mmHg, the estimated right ventricular  systolic  pressure is mildly elevated at 37.4 mmHg.  13. The inferior vena cava is normal in size with greater than 50%  respiratory variability, suggesting right atrial pressure of 3 mmHg.   In comparison to the previous echocardiogram(s): 05/23/12 EF 55-65%. PA  pressure .  FINDINGS   Left Ventricle: Left ventricular ejection fraction, by visual estimation,  is 60  to 65%. The left ventricle has normal function. There is mildly  increased left ventricular hypertrophy. Normal left ventricular size.  Spectral Doppler shows Left ventricular   diastolic Doppler parameters are consistent with impaired relaxation  pattern of LV diastolic filling.   Right Ventricle: The right ventricular size is mildly enlarged. No  increase in right ventricular wall thickness. Global RV systolic function  is has normal systolic function. The tricuspid regurgitant velocity is  2.71 m/s, and with an assumed right atrial   pressure of 8 mmHg, the estimated right ventricular systolic pressure is  mildly elevated at 37.4 mmHg.   Left Atrium: Left atrial size was normal in size.   Right Atrium: Right atrial size was normal in size   Pericardium: There is no evidence of pericardial effusion.   Mitral Valve: The mitral valve is normal in structure. No evidence of  mitral valve stenosis by observation. Mild mitral valve regurgitation.   Tricuspid Valve: The tricuspid valve is normal in structure. Tricuspid  valve regurgitation is trivial by color flow Doppler.   Aortic Valve: The aortic valve is normal in structure. Aortic valve  regurgitation was not visualized by color flow Doppler. The aortic valve  is structurally normal, with no evidence of sclerosis or stenosis.   Pulmonic Valve: The pulmonic valve was normal in structure. Pulmonic valve  regurgitation is trivial by color flow Doppler.   Aorta: The aortic root, ascending aorta and aortic arch are all  structurally normal, with no evidence of dilitation or obstruction. There  is mild dilatation of the aortic root measuring 42 mm.   Venous: The inferior vena cava is normal in size with greater than 50%  respiratory variability, suggesting right atrial pressure of 3 mmHg.   IAS/Shunts: No atrial level shunt detected by color flow Doppler. No  ventricular septal defect is seen or detected. There is no evidence of an   atrial septal defect.     Assessment & Plan   1.  Essential hypertension-BP today 114/58 Maintain physical activity Maintain blood pressure log Low-sodium diet Metoprolol, telmisartan, HCTZ  Coronary artery disease-denies chest discomfort todat..  Status post CABG in 2007 by Dr. Tyrone Sage.  Had negative stress test in 2010. Continue current medical therapy Heart healthy low-sodium high-fiber diet-reviewed  Hyperlipidemia-LDL 75 01/27/22 and was 58 on 04/11/23 High-fiber diet Maintain physical activity   Dizziness-continues to note occasional episodes with coughing.    Maintain p.o. hydration Lower extremity support stockings Following with neurology    Disposition: Follow-up with Dr. Swaziland or me in 6-8 months.   Thomasene Ripple. Krissie Merrick NP-C     10/26/2023, 10:45 AM [facility] [address] Office [phone] Fax [phone]    I spent 14 minutes examining this patient, reviewing medications, and using patient centered shared decision making involving her cardiac care.  Prior to her visit I spent greater than 20 minutes reviewing her past medical history,  medications, and prior cardiac tests.

## 2023-10-26 ENCOUNTER — Encounter: Payer: Self-pay | Admitting: General Practice

## 2023-10-26 ENCOUNTER — Ambulatory Visit: Payer: Medicare HMO | Attending: General Practice | Admitting: General Practice

## 2023-10-26 VITALS — BP 114/58 | HR 69 | Ht 65.0 in | Wt 187.0 lb

## 2023-10-26 DIAGNOSIS — R42 Dizziness and giddiness: Secondary | ICD-10-CM | POA: Diagnosis not present

## 2023-10-26 DIAGNOSIS — I25810 Atherosclerosis of coronary artery bypass graft(s) without angina pectoris: Secondary | ICD-10-CM | POA: Diagnosis not present

## 2023-10-26 DIAGNOSIS — I10 Essential (primary) hypertension: Secondary | ICD-10-CM | POA: Diagnosis not present

## 2023-10-26 DIAGNOSIS — E7800 Pure hypercholesterolemia, unspecified: Secondary | ICD-10-CM

## 2023-10-26 NOTE — Patient Instructions (Signed)
Medication Instructions:   No changes  *If you need a refill on your cardiac medications before your next appointment, please call your pharmacy*   Lab Work:  Not needed     Testing/Procedures:  Not needed  Follow-Up: At [facility], you and your health needs are our priority.  As part of our continuing mission to provide you with exceptional heart care, we have created designated Provider Care Teams.  These Care Teams include your primary Cardiologist (physician) and Advanced Practice Providers (APPs -  Physician Assistants and Nurse Practitioners) who all work together to provide you with the care you need, when you need it.  We recommend signing up for the patient portal called "MyChart".  Sign up information is provided on this After Visit Summary.  MyChart is used to connect with patients for Virtual Visits (Telemedicine).  Patients are able to view lab/test results, encounter notes, upcoming appointments, etc.  Non-urgent messages can be sent to your provider as well.   To learn more about what you can do with MyChart, go to [email or web address].    Your next appointment:   6 to 8 month(s)  The format for your next appointment:   In Person  Provider:   Peter [place]iland, MD  or Edd Fabian, FNP.   Other Instructions   Continue  using  compression socks daily

## 2023-12-06 DIAGNOSIS — Z96641 Presence of right artificial hip joint: Secondary | ICD-10-CM | POA: Diagnosis not present

## 2023-12-06 DIAGNOSIS — M170 Bilateral primary osteoarthritis of knee: Secondary | ICD-10-CM | POA: Diagnosis not present

## 2023-12-23 DIAGNOSIS — Z008 Encounter for other general examination: Secondary | ICD-10-CM | POA: Diagnosis not present

## 2024-01-02 DIAGNOSIS — M170 Bilateral primary osteoarthritis of knee: Secondary | ICD-10-CM | POA: Diagnosis not present

## 2024-01-10 DIAGNOSIS — M170 Bilateral primary osteoarthritis of knee: Secondary | ICD-10-CM | POA: Diagnosis not present

## 2024-01-17 DIAGNOSIS — M170 Bilateral primary osteoarthritis of knee: Secondary | ICD-10-CM | POA: Diagnosis not present

## 2024-02-28 DIAGNOSIS — M170 Bilateral primary osteoarthritis of knee: Secondary | ICD-10-CM | POA: Diagnosis not present

## 2024-04-03 DIAGNOSIS — F5104 Psychophysiologic insomnia: Secondary | ICD-10-CM | POA: Diagnosis not present

## 2024-04-03 DIAGNOSIS — R059 Cough, unspecified: Secondary | ICD-10-CM | POA: Diagnosis not present

## 2024-04-03 DIAGNOSIS — R262 Difficulty in walking, not elsewhere classified: Secondary | ICD-10-CM | POA: Diagnosis not present

## 2024-04-03 DIAGNOSIS — I2510 Atherosclerotic heart disease of native coronary artery without angina pectoris: Secondary | ICD-10-CM | POA: Diagnosis not present

## 2024-04-03 DIAGNOSIS — F419 Anxiety disorder, unspecified: Secondary | ICD-10-CM | POA: Diagnosis not present

## 2024-04-03 DIAGNOSIS — E7800 Pure hypercholesterolemia, unspecified: Secondary | ICD-10-CM | POA: Diagnosis not present

## 2024-04-03 DIAGNOSIS — Z0000 Encounter for general adult medical examination without abnormal findings: Secondary | ICD-10-CM | POA: Diagnosis not present

## 2024-04-03 DIAGNOSIS — J4520 Mild intermittent asthma, uncomplicated: Secondary | ICD-10-CM | POA: Diagnosis not present

## 2024-04-03 DIAGNOSIS — I10 Essential (primary) hypertension: Secondary | ICD-10-CM | POA: Diagnosis not present

## 2024-04-03 DIAGNOSIS — D696 Thrombocytopenia, unspecified: Secondary | ICD-10-CM | POA: Diagnosis not present

## 2024-04-09 ENCOUNTER — Other Ambulatory Visit: Payer: Self-pay | Admitting: Urology

## 2024-04-09 DIAGNOSIS — N401 Enlarged prostate with lower urinary tract symptoms: Secondary | ICD-10-CM

## 2024-05-01 NOTE — Progress Notes (Unsigned)
 CARDIOLOGY OFFICE NOTE  Date:  05/04/2024    Scott [name] Date of Birth: [birth year] Medical Record #[id number]  PCP:  [facility]  Cardiologist:  Marina Boerner [place] MD  Chief Complaint  Patient presents with   Coronary Artery Disease    History of Present Illness: Scott [name] is a 89 y.o. male who presents today for follow up CAD and edema.  He has known CAD with remote CABG back in 2007 by  Dr. Nicanor Barge. Negative stress Myoview  in 2010. Other issues include  HTN, HLD and OSA. Last echo in 2020.  In 2020 evaluation for edema with lab work including Chemistries, TSH, CBC,UA,  and BNP which were all normal except chronically low platelets. LE dopplers were negative for DVT. Echo was fairly unremarkable with gr 2 diastolic dysfunction and mild pulmonary HTN. EF was normal.   Followed by Dr Linder Revere for OSA with CPAP.  On follow up today he is ok. Notes some chronic SOB and swelling. No chest pain or dyspnea. Doesn't like taking lasix due to increase urination. Only takes occasionally.   Past Medical History:  Diagnosis Date   Anemia    Acute blood loss anemia secondary to surgery, followed fr/ Hip replacement    Anxiety    pt. reports that he has occas. nervous episodes    BPH (benign prostatic hyperplasia)    CAD (coronary artery disease)    s/p CABG in 2007  CARDILOLOGIST IS DR. P. [place]   Cancer (HCC)    melanoma - removed L ear - 2010   Cranial neuropathy    resolved    Generalized OA    GERD (gastroesophageal reflux disease)    H/O echocardiogram    last echo, stress test 05/2012- pt. followed by [place]    Hypercholesterolemia    Hypertension    Obesity    Osteoarthritis    End-stage osteoarthritis, right hip   Sleep apnea    with use of CPAP   SOB (shortness of breath)    WITH EXERTION   Thrombocytopenia (HCC)    possibly due to Lovenox    Past Surgical History:  Procedure Laterality Date   ACHILLES TENDON SURGERY  10/06/2012    Procedure: ACHILLES TENDON REPAIR;  Surgeon: Forbes Ida., MD;  Location: [facility] OR;  Service: Orthopedics;  Laterality: Right;  REPAIR RIGHT RUPTURE ACHILLES TENDON PRIMARY OPEN/PERCUTANEOUS, EXCISION PARTIAL BONE TALUS/CANCANEUS, REPAIR PARTIAL EXCISION CALCANEUS   APPENDECTOMY     ARTERY BIOPSY     temporal - L side- wnl, double vision treated /w prednisone - 1991   CALCANEAL OSTEOTOMY  10/06/2012   Procedure: CALCANEAL OSTEOTOMY;  Surgeon: Forbes Ida., MD;  Location: [facility] OR;  Service: Orthopedics;  Laterality: Right;   CARDIAC CATHETERIZATION  12/15/2005   NORMAL. EF 60%, angioplasty 1995   carpel tunnel surgery-bilateral     both hands    CORONARY ARTERY BYPASS GRAFT  2007   LIMA GRAFT TO THE LAD, SAPHENOUS VEIN GRAFT TO THE FIRST OBTUSE MARIGNAL, SAPHENOUS VEIN GRAFT TO THE SECOND OBTUSE MARGINAL AND A SAPHENOUS VEIN GRAFT TO THE ACUTE MARGINAL AND POSTERIOR LATERAL BRANCH   HERNIA REPAIR     I & D EXTREMITY  12/11/2012   Procedure: IRRIGATION AND DEBRIDEMENT EXTREMITY;  Surgeon: Timothy Ford, MD;  Location: [facility] OR;  Service: Orthopedics;  Laterality: Right;  Irrigation and Debridement achilles, wound closure, apply wound vac, place antibiotic beads   INGUINAL HERNIA REPAIR Left 05/10/2013  Procedure: HERNIA REPAIR INGUINAL ADULT;  Surgeon: Keitha Pata, MD;  Location: [facility];  Service: General;  Laterality: Left;   INSERTION OF MESH Left 05/10/2013   Procedure: INSERTION OF MESH;  Surgeon: Keitha Pata, MD;  Location: [facility];  Service: General;  Laterality: Left;   JOINT REPLACEMENT     knee  11/12   left arthroscopic   TOTAL HIP ARTHROPLASTY     right     Medications: Current Outpatient Medications  Medication Sig Dispense Refill   albuterol  (VENTOLIN  HFA) 108 (90 Base) MCG/ACT inhaler Inhale 1-2 puffs into the lungs every 6 (six) hours as needed for wheezing or shortness of breath. 1 each 0   aspirin  81 MG tablet Take 81 mg by mouth every evening.     BREO ELLIPTA 100-25  MCG/INH AEPB Inhale 1 puff into the lungs daily.     EPINEPHrine  0.3 mg/0.3 mL IJ SOAJ injection Inject 0.3 mg into the muscle as needed for anaphylaxis. 1 each 0   fish oil-omega-3 fatty acids 1000 MG capsule Take 1 g by mouth every other day.     furosemide (LASIX) 20 MG tablet TAKE 1 TABLET BY MOUTH ONCE EACH MORNING AS NEEDED FOR SWELLING/EDEMA     hydrochlorothiazide  (HYDRODIURIL ) 12.5 MG tablet Take 12.5 mg by mouth every morning.     Influenza Vac High-Dose Quad (FLUZONE HIGH-DOSE QUADRIVALENT) 0.7 ML SUSY Fluzone High-Dose Quad 2020-21 (PF) 240 mcg/0.7 mL IM syringe  PHARMACY ADMINISTERED     lovastatin (MEVACOR) 20 MG tablet Take 20 mg by mouth at bedtime.     meclizine  (ANTIVERT ) 12.5 MG tablet Take 1 tablet (12.5 mg total) by mouth 3 (three) times daily as needed for dizziness. 15 tablet 0   metoprolol  (LOPRESSOR ) 50 MG tablet Take 25 mg by mouth 2 (two) times daily.     pantoprazole  (PROTONIX ) 20 MG tablet Take 1 tablet (20 mg total) by mouth daily. 30 tablet 0   polyethylene glycol (MIRALAX  / GLYCOLAX ) 17 g packet Take 17 g by mouth daily. 14 each 0   potassium chloride  (K-DUR,KLOR-CON ) 10 MEQ tablet Take 10 mEq by mouth every evening.      potassium chloride  (KLOR-CON ) 10 MEQ tablet potassium chloride  ER 10 mEq tablet,extended release  TK 1 T PO QD     tamsulosin  (FLOMAX ) 0.4 MG CAPS capsule Take 1 capsule (0.4 mg total) by mouth at bedtime. 30 capsule 5   telmisartan (MICARDIS) 40 MG tablet Take 40 mg by mouth daily.     traZODone  (DESYREL ) 50 MG tablet Take 1 tablet (50 mg total) by mouth at bedtime. 30 tablet prn   triazolam  (HALCION ) 0.25 MG tablet TAKE 1 TABLET (0.25 MG TOTAL) BY MOUTH AT BEDTIME AS NEEDED FOR SLEEP. 30 tablet 5   vitamin E  400 UNIT capsule Take 400 Units by mouth daily.      No current facility-administered medications for this visit.    Allergies: Allergies  Allergen Reactions   Acetaminophen      Other reaction(s): mouth sores   Amlodipine Swelling     Swelling of feet and legs   Bactrim  [Sulfamethoxazole -Trimethoprim ] Other (See Comments)    Constipation    Codeine Nausea Only   Fexofenadine     Other reaction(s): mouth sores   Ibuprofen      Other reaction(s): nervousness   Lovenox [Enoxaparin Sodium] Other (See Comments)    MAY HAVE CAUSED THROMBOCYTOPENIA   Naproxen     Other reaction(s): mouth sores   Prednisone Swelling  Patient has tolerated doses for colds. Thinks it caused swelling in the past   Shellfish Allergy Nausea And Vomiting and Other (See Comments)    N&V, shaking   Sonata  [Zaleplon ] Other (See Comments)    Hyper feelings    Social History: The patient  reports that he has never smoked. He has never used smokeless tobacco. He reports that he does not drink alcohol and does not use drugs.   Family History: The patient's family history includes Heart disease in his brother and brother; Prostate cancer in his brother.   Review of Systems: Please see the history of present illness.     All other systems are reviewed and negative.   Physical Exam: VS:  BP 130/72   Pulse (!) 59   Ht 5\' 6"  (1.676 m)   Wt 197 lb 12.8 oz (89.7 kg)   SpO2 95%   BMI 31.93 kg/m  .  BMI Body mass index is 31.93 kg/m.  Wt Readings from Last 3 Encounters:  05/04/24 197 lb 12.8 oz (89.7 kg)  10/26/23 187 lb (84.8 kg)  09/28/23 178 lb (80.7 kg)   GENERAL:  Elderly  WM in NAD HEENT:  PERRL, EOMI, sclera are clear. Oropharynx is clear. NECK:  No jugular venous distention, carotid upstroke brisk and symmetric, no bruits, no thyromegaly or adenopathy LUNGS:  diminished BS in bases. No rales.  CHEST:  Unremarkable HEART:  RRR,  PMI not displaced or sustained,S1 and S2 within normal limits, no S3, no S4: no clicks, no rubs, no murmurs ABD:  Soft, nontender. BS +, no masses or bruits. No hepatomegaly, no splenomegaly EXT:  2 + pulses throughout, 1+  pretibial on left.  Edema with stasis dermatitis.  no cyanosis no clubbing SKIN:   Warm and dry.  No rashes NEURO:  Alert and oriented x 3. Cranial nerves II through XII intact. PSYCH:  Cognitively intact   LABORATORY DATA:  EKG Interpretation Date/Time:  Friday May 04 2024 10:52:26 EDT Ventricular Rate:  59 PR Interval:  472 QRS Duration:  84 QT Interval:  420 QTC Calculation: 415 R Axis:   67  Text Interpretation: Sinus bradycardia with sinus arrhythmia with 1st degree A-V block Nonspecific ST abnormality When compared with ECG of 04-Jan-2023 07:34, No significant change was found Confirmed by [place], Jaylanni Eltringham [phone]) on 05/04/2024 11:15:03 AM    Lab Results  Component Value Date   WBC 6.3 01/04/2023   HGB 13.7 01/04/2023   HCT 41.6 01/04/2023   PLT 134 (L) 01/04/2023   GLUCOSE 91 01/04/2023   CHOL 152 05/17/2011   TRIG 56 04/17/2019   HDL 40.30 05/17/2011   LDLCALC 82 05/17/2011   ALT 18 01/04/2023   AST 24 01/04/2023   NA 139 01/04/2023   K 3.4 (L) 01/04/2023   CL 102 01/04/2023   CREATININE 1.06 01/04/2023   BUN 16 01/04/2023   CO2 28 01/04/2023   TSH 1.750 09/05/2019   INR 1.1 01/04/2023    BNP (last 3 results) No results for input(s): "BNP" in the last 8760 hours.  ProBNP (last 3 results) No results for input(s): "PROBNP" in the last 8760 hours.   Other Studies Reviewed Today:  Labs from primary care: 01/07/16: cholesterol 141, triglycerides 144, LDL 65, HDL 47.  09/10/16: BMET normal. Dated 11/16/17: cholesterol 161, triglycerides 134, HDL 51, LDL 84. BMET normal Dated 11/21/18: cholesterol 160, triglycerides 182, HDL 54, LDL 70. Potassium 3.3. creatinine normal. Dated 12/13/19: cholesterol 142, triglycerides 159, HDL 49, LDL 66.  Dated 01/16/20: normal CBC and CMET Dated 12/10/20: normal Hgb and BMET  Echo 09/10/19: IMPRESSIONS      1. Left ventricular ejection fraction, by visual estimation, is 60 to 65%. The left ventricle has normal function. Normal left ventricular size. There is mildly increased left ventricular hypertrophy.  2. Left  ventricular diastolic Doppler parameters are consistent with impaired relaxation pattern of LV diastolic filling.  3. Global right ventricle has normal systolic function.The right ventricular size is mildly enlarged. No increase in right ventricular wall thickness.  4. Left atrial size was normal.  5. Right atrial size was normal.  6. The mitral valve is normal in structure. Mild mitral valve regurgitation. No evidence of mitral stenosis.  7. The tricuspid valve is normal in structure. Tricuspid valve regurgitation is trivial.  8. The aortic valve is normal in structure. Aortic valve regurgitation was not visualized by color flow Doppler. Structurally normal aortic valve, with no evidence of sclerosis or stenosis.  9. The pulmonic valve was normal in structure. Pulmonic valve regurgitation is trivial by color flow Doppler. 10. There is mild dilatation of the aortic root measuring 42 mm. 11. Mildly elevated pulmonary artery systolic pressure. 12. The tricuspid regurgitant velocity is 2.71 m/s, and with an assumed right atrial pressure of 8 mmHg, the estimated right ventricular systolic pressure is mildly elevated at 37.4 mmHg. 13. The inferior vena cava is normal in size with greater than 50% respiratory variability, suggesting right atrial pressure of 3 mmHg.   In comparison to the previous echocardiogram(s): 05/23/12 EF 55-65%. PA pressure .  Assessment/Plan: 1.  CAD with prior CABG back in 2007 - he remains asymptomatic. Continue medical therapy with ASA, beta blocker and statin.  2. HLD - on statin therapy. Labs with PCP  3. HTN controlled.   4. SOB and edema. Suspect component of diastolic CHF. Encourage him to take lasix daily. Restrict salt intake. We considered SGLT 2 inhibitor but this is too expensive.    Disposition:   Follow up  in one year  Signed: Rajesh Wyss [place] MD, [facility]  05/04/2024 11:17 AM  [facility]

## 2024-05-04 ENCOUNTER — Ambulatory Visit: Attending: Cardiology | Admitting: Cardiology

## 2024-05-04 ENCOUNTER — Encounter: Payer: Self-pay | Admitting: Cardiology

## 2024-05-04 VITALS — BP 130/72 | HR 59 | Ht 66.0 in | Wt 197.8 lb

## 2024-05-04 DIAGNOSIS — I25810 Atherosclerosis of coronary artery bypass graft(s) without angina pectoris: Secondary | ICD-10-CM

## 2024-05-04 DIAGNOSIS — E7800 Pure hypercholesterolemia, unspecified: Secondary | ICD-10-CM

## 2024-05-04 DIAGNOSIS — I10 Essential (primary) hypertension: Secondary | ICD-10-CM

## 2024-05-04 NOTE — Patient Instructions (Signed)

## 2024-05-05 DIAGNOSIS — J4520 Mild intermittent asthma, uncomplicated: Secondary | ICD-10-CM | POA: Diagnosis not present

## 2024-05-05 DIAGNOSIS — E7800 Pure hypercholesterolemia, unspecified: Secondary | ICD-10-CM | POA: Diagnosis not present

## 2024-05-05 DIAGNOSIS — I10 Essential (primary) hypertension: Secondary | ICD-10-CM | POA: Diagnosis not present

## 2024-05-05 DIAGNOSIS — I2510 Atherosclerotic heart disease of native coronary artery without angina pectoris: Secondary | ICD-10-CM | POA: Diagnosis not present

## 2024-05-24 ENCOUNTER — Emergency Department (HOSPITAL_BASED_OUTPATIENT_CLINIC_OR_DEPARTMENT_OTHER)

## 2024-05-24 ENCOUNTER — Other Ambulatory Visit: Payer: Self-pay

## 2024-05-24 ENCOUNTER — Encounter (HOSPITAL_BASED_OUTPATIENT_CLINIC_OR_DEPARTMENT_OTHER): Payer: Self-pay | Admitting: Emergency Medicine

## 2024-05-24 ENCOUNTER — Inpatient Hospital Stay (HOSPITAL_BASED_OUTPATIENT_CLINIC_OR_DEPARTMENT_OTHER)
Admission: EM | Admit: 2024-05-24 | Discharge: 2024-05-27 | DRG: 291 | Disposition: A | Discharge status: Home / Self Care | Attending: Internal Medicine | Admitting: Internal Medicine

## 2024-05-24 DIAGNOSIS — Z7951 Long term (current) use of inhaled steroids: Secondary | ICD-10-CM

## 2024-05-24 DIAGNOSIS — R9431 Abnormal electrocardiogram [ECG] [EKG]: Secondary | ICD-10-CM | POA: Insufficient documentation

## 2024-05-24 DIAGNOSIS — K5909 Other constipation: Secondary | ICD-10-CM | POA: Diagnosis present

## 2024-05-24 DIAGNOSIS — I110 Hypertensive heart disease with heart failure: Principal | ICD-10-CM | POA: Diagnosis present

## 2024-05-24 DIAGNOSIS — D539 Nutritional anemia, unspecified: Secondary | ICD-10-CM | POA: Diagnosis present

## 2024-05-24 DIAGNOSIS — I272 Pulmonary hypertension, unspecified: Secondary | ICD-10-CM | POA: Diagnosis present

## 2024-05-24 DIAGNOSIS — Z8042 Family history of malignant neoplasm of prostate: Secondary | ICD-10-CM

## 2024-05-24 DIAGNOSIS — K5730 Diverticulosis of large intestine without perforation or abscess without bleeding: Secondary | ICD-10-CM | POA: Diagnosis not present

## 2024-05-24 DIAGNOSIS — R600 Localized edema: Secondary | ICD-10-CM | POA: Diagnosis not present

## 2024-05-24 DIAGNOSIS — Z886 Allergy status to analgesic agent status: Secondary | ICD-10-CM

## 2024-05-24 DIAGNOSIS — S0990XA Unspecified injury of head, initial encounter: Secondary | ICD-10-CM | POA: Diagnosis not present

## 2024-05-24 DIAGNOSIS — Z85820 Personal history of malignant melanoma of skin: Secondary | ICD-10-CM

## 2024-05-24 DIAGNOSIS — E7800 Pure hypercholesterolemia, unspecified: Secondary | ICD-10-CM | POA: Diagnosis present

## 2024-05-24 DIAGNOSIS — E669 Obesity, unspecified: Secondary | ICD-10-CM | POA: Diagnosis present

## 2024-05-24 DIAGNOSIS — Z951 Presence of aortocoronary bypass graft: Secondary | ICD-10-CM

## 2024-05-24 DIAGNOSIS — Z881 Allergy status to other antibiotic agents status: Secondary | ICD-10-CM

## 2024-05-24 DIAGNOSIS — G4733 Obstructive sleep apnea (adult) (pediatric): Secondary | ICD-10-CM | POA: Diagnosis present

## 2024-05-24 DIAGNOSIS — Z882 Allergy status to sulfonamides status: Secondary | ICD-10-CM

## 2024-05-24 DIAGNOSIS — I10 Essential (primary) hypertension: Secondary | ICD-10-CM | POA: Diagnosis present

## 2024-05-24 DIAGNOSIS — Z91013 Allergy to seafood: Secondary | ICD-10-CM

## 2024-05-24 DIAGNOSIS — I440 Atrioventricular block, first degree: Secondary | ICD-10-CM | POA: Diagnosis present

## 2024-05-24 DIAGNOSIS — I509 Heart failure, unspecified: Secondary | ICD-10-CM

## 2024-05-24 DIAGNOSIS — R0609 Other forms of dyspnea: Secondary | ICD-10-CM

## 2024-05-24 DIAGNOSIS — Z6833 Body mass index (BMI) 33.0-33.9, adult: Secondary | ICD-10-CM | POA: Diagnosis not present

## 2024-05-24 DIAGNOSIS — I672 Cerebral atherosclerosis: Secondary | ICD-10-CM | POA: Diagnosis not present

## 2024-05-24 DIAGNOSIS — Z96641 Presence of right artificial hip joint: Secondary | ICD-10-CM | POA: Diagnosis present

## 2024-05-24 DIAGNOSIS — I700 Atherosclerosis of aorta: Secondary | ICD-10-CM | POA: Diagnosis not present

## 2024-05-24 DIAGNOSIS — R0602 Shortness of breath: Secondary | ICD-10-CM | POA: Diagnosis not present

## 2024-05-24 DIAGNOSIS — Z8249 Family history of ischemic heart disease and other diseases of the circulatory system: Secondary | ICD-10-CM

## 2024-05-24 DIAGNOSIS — G319 Degenerative disease of nervous system, unspecified: Secondary | ICD-10-CM | POA: Diagnosis not present

## 2024-05-24 DIAGNOSIS — Z6832 Body mass index (BMI) 32.0-32.9, adult: Secondary | ICD-10-CM

## 2024-05-24 DIAGNOSIS — I2510 Atherosclerotic heart disease of native coronary artery without angina pectoris: Secondary | ICD-10-CM | POA: Diagnosis present

## 2024-05-24 DIAGNOSIS — Z79899 Other long term (current) drug therapy: Secondary | ICD-10-CM

## 2024-05-24 DIAGNOSIS — R55 Syncope and collapse: Secondary | ICD-10-CM | POA: Diagnosis not present

## 2024-05-24 DIAGNOSIS — Z96652 Presence of left artificial knee joint: Secondary | ICD-10-CM | POA: Diagnosis present

## 2024-05-24 DIAGNOSIS — D649 Anemia, unspecified: Secondary | ICD-10-CM | POA: Diagnosis present

## 2024-05-24 DIAGNOSIS — K219 Gastro-esophageal reflux disease without esophagitis: Secondary | ICD-10-CM | POA: Diagnosis present

## 2024-05-24 DIAGNOSIS — Z885 Allergy status to narcotic agent status: Secondary | ICD-10-CM

## 2024-05-24 DIAGNOSIS — D696 Thrombocytopenia, unspecified: Secondary | ICD-10-CM | POA: Diagnosis not present

## 2024-05-24 DIAGNOSIS — I4891 Unspecified atrial fibrillation: Principal | ICD-10-CM

## 2024-05-24 DIAGNOSIS — I6782 Cerebral ischemia: Secondary | ICD-10-CM | POA: Diagnosis not present

## 2024-05-24 DIAGNOSIS — N400 Enlarged prostate without lower urinary tract symptoms: Secondary | ICD-10-CM | POA: Diagnosis present

## 2024-05-24 DIAGNOSIS — J449 Chronic obstructive pulmonary disease, unspecified: Secondary | ICD-10-CM | POA: Diagnosis present

## 2024-05-24 DIAGNOSIS — Z91148 Patient's other noncompliance with medication regimen for other reason: Secondary | ICD-10-CM

## 2024-05-24 DIAGNOSIS — F419 Anxiety disorder, unspecified: Secondary | ICD-10-CM | POA: Diagnosis present

## 2024-05-24 DIAGNOSIS — I5032 Chronic diastolic (congestive) heart failure: Secondary | ICD-10-CM

## 2024-05-24 DIAGNOSIS — Z7982 Long term (current) use of aspirin: Secondary | ICD-10-CM

## 2024-05-24 DIAGNOSIS — Z888 Allergy status to other drugs, medicaments and biological substances status: Secondary | ICD-10-CM

## 2024-05-24 DIAGNOSIS — I5033 Acute on chronic diastolic (congestive) heart failure: Secondary | ICD-10-CM | POA: Diagnosis present

## 2024-05-24 DIAGNOSIS — I441 Atrioventricular block, second degree: Secondary | ICD-10-CM | POA: Diagnosis present

## 2024-05-24 DIAGNOSIS — M549 Dorsalgia, unspecified: Secondary | ICD-10-CM | POA: Diagnosis not present

## 2024-05-24 LAB — URINALYSIS, ROUTINE W REFLEX MICROSCOPIC
Bilirubin Urine: NEGATIVE
Glucose, UA: NEGATIVE mg/dL
Hgb urine dipstick: NEGATIVE
Ketones, ur: NEGATIVE mg/dL
Leukocytes,Ua: NEGATIVE
Nitrite: NEGATIVE
Protein, ur: 100 mg/dL — AB
Specific Gravity, Urine: 1.015 (ref 1.005–1.030)
pH: 7.5 (ref 5.0–8.0)

## 2024-05-24 LAB — BASIC METABOLIC PANEL WITH GFR
Anion gap: 12 (ref 5–15)
BUN: 16 mg/dL (ref 8–23)
CO2: 31 mmol/L (ref 22–32)
Calcium: 8.8 mg/dL — ABNORMAL LOW (ref 8.9–10.3)
Chloride: 95 mmol/L — ABNORMAL LOW (ref 98–111)
Creatinine, Ser: 1.21 mg/dL (ref 0.61–1.24)
GFR, Estimated: 56 mL/min — ABNORMAL LOW (ref >60)
Glucose, Bld: 166 mg/dL — ABNORMAL HIGH (ref 70–99)
Potassium: 3.9 mmol/L (ref 3.5–5.1)
Sodium: 137 mmol/L (ref 135–145)

## 2024-05-24 LAB — URINALYSIS, MICROSCOPIC (REFLEX): WBC, UA: NONE SEEN WBC/hpf (ref 0–5)

## 2024-05-24 LAB — CBC
HCT: 40.1 % (ref 39.0–52.0)
Hemoglobin: 12.7 g/dL — ABNORMAL LOW (ref 13.0–17.0)
MCH: 31.8 pg (ref 26.0–34.0)
MCHC: 31.7 g/dL (ref 30.0–36.0)
MCV: 100.3 fL — ABNORMAL HIGH (ref 80.0–100.0)
Platelets: 154 10*3/uL (ref 150–400)
RBC: 4 MIL/uL — ABNORMAL LOW (ref 4.22–5.81)
RDW: 13.5 % (ref 11.5–15.5)
WBC: 9.6 10*3/uL (ref 4.0–10.5)
nRBC: 0 % (ref 0.0–0.2)

## 2024-05-24 LAB — PRO BRAIN NATRIURETIC PEPTIDE: Pro Brain Natriuretic Peptide: 2422 pg/mL — ABNORMAL HIGH (ref <300.0)

## 2024-05-24 LAB — PROTIME-INR
INR: 1 (ref 0.8–1.2)
Prothrombin Time: 13.6 s (ref 11.4–15.2)

## 2024-05-24 LAB — TROPONIN T, HIGH SENSITIVITY
Troponin T High Sensitivity: 38 ng/L — ABNORMAL HIGH (ref <19)
Troponin T High Sensitivity: 36 ng/L — ABNORMAL HIGH (ref <19)

## 2024-05-24 MED ORDER — TRAZODONE HCL 50 MG PO TABS
50.0000 mg | ORAL_TABLET | Freq: Once | ORAL | Status: AC
Start: 2024-05-24 — End: 2024-05-24
  Administered 2024-05-24: 50 mg via ORAL
  Filled 2024-05-24: qty 1

## 2024-05-24 MED ORDER — FUROSEMIDE 10 MG/ML IJ SOLN
20.0000 mg | Freq: Once | INTRAMUSCULAR | Status: AC
Start: 2024-05-24 — End: 2024-05-24
  Administered 2024-05-24: 20 mg via INTRAVENOUS
  Filled 2024-05-24: qty 2

## 2024-05-24 MED ORDER — IOHEXOL 300 MG/ML  SOLN
100.0000 mL | Freq: Once | INTRAMUSCULAR | Status: AC | PRN
  Administered 2024-05-24: 100 mL via INTRAVENOUS

## 2024-05-24 NOTE — Plan of Care (Signed)
 Patient accepted to tele cardiac in patient. 89 yo with syncope ? Cough syncope hit back of head.not on AC. CT chest abd pelvic nad.ct head nad. Found to be in new afib rate controlled and elevated bnp got lasix 20 mg once.edp talked to dr Donna Fus (fellow) Dr [place] is his cardiologist.

## 2024-05-24 NOTE — ED Triage Notes (Signed)
 Pt sent from MD office.  Pt fell at home while having a coughing spell.  Pt had syncopal event at home, was unconscious for a few minutes.  Pt did hit the back of his head.  Pt states he started to have chest pain and back pain after fall.  Pt recently having increased symptoms of CHF and does take Lasix semi-as ordered.

## 2024-05-24 NOTE — ED Provider Notes (Signed)
 [facility] Provider Note   CSN: [id number] Arrival date & time: 05/24/24  4098     Patient presents with: Fall and Chest Pain   Scott [name] is a 89 y.o. male.   Patient to ED after syncopal episode that occurred this morning around 11:30 pm. He reports he went into a coughing fit, became SOB and passed out, hitting the back of his head. He was unconscious for a brief period, per family. He complains of pain across the upper abdomen around to the back. No vomiting. He has been dealing with a dry cough recently, no fever, congestion. Son reports progressively worsening SOB/DOE over the last 3 weeks, with increased swelling of the lower extremities. He has been prescribed Lasix but he will stop taking it when he becomes constipated. He has missed Lasix dosing for the past 3 days.   The history is provided by the patient. No language interpreter was used.  Fall Associated symptoms include chest pain.  Chest Pain      Prior to Admission medications   Medication Sig Start Date End Date Taking? Authorizing Provider  albuterol  (VENTOLIN  HFA) 108 (90 Base) MCG/ACT inhaler Inhale 1-2 puffs into the lungs every 6 (six) hours as needed for wheezing or shortness of breath. 11/28/20   Feliz Hosteller, MD  aspirin  81 MG tablet Take 81 mg by mouth every evening.    [provider]  BREO ELLIPTA 100-25 MCG/INH AEPB Inhale 1 puff into the lungs daily. 12/23/20   [provider]  EPINEPHrine  0.3 mg/0.3 mL IJ SOAJ injection Inject 0.3 mg into the muscle as needed for anaphylaxis. 11/28/20   Feliz Hosteller, MD  fish oil-omega-3 fatty acids 1000 MG capsule Take 1 g by mouth every other day.    [provider]  furosemide (LASIX) 20 MG tablet TAKE 1 TABLET BY MOUTH ONCE EACH MORNING AS NEEDED FOR SWELLING/EDEMA 08/26/19   [provider]  hydrochlorothiazide  (HYDRODIURIL ) 12.5 MG tablet Take 12.5 mg by mouth every morning.  12/13/19   [provider]  Influenza Vac High-Dose Quad (FLUZONE HIGH-DOSE QUADRIVALENT) 0.7 ML SUSY Fluzone High-Dose Quad 2020-21 (PF) 240 mcg/0.7 mL IM syringe  PHARMACY ADMINISTERED    [provider]  lovastatin (MEVACOR) 20 MG tablet Take 20 mg by mouth at bedtime.    [provider]  meclizine  (ANTIVERT ) 12.5 MG tablet Take 1 tablet (12.5 mg total) by mouth 3 (three) times daily as needed for dizziness. 01/04/23   Dalene Duck, MD  metoprolol  (LOPRESSOR ) 50 MG tablet Take 25 mg by mouth 2 (two) times daily.    [provider]  pantoprazole  (PROTONIX ) 20 MG tablet Take 1 tablet (20 mg total) by mouth daily. 04/05/23   Carie Charity, NP  polyethylene glycol (MIRALAX  / GLYCOLAX ) 17 g packet Take 17 g by mouth daily. 09/04/23   Auston Blush, MD  potassium chloride  (K-DUR,KLOR-CON ) 10 MEQ tablet Take 10 mEq by mouth every evening.     [provider]  potassium chloride  (KLOR-CON ) 10 MEQ tablet potassium chloride  ER 10 mEq tablet,extended release  TK 1 T PO QD    [provider]  tamsulosin  (FLOMAX ) 0.4 MG CAPS capsule Take 1 capsule (0.4 mg total) by mouth at bedtime. 09/28/23   Stoneking, Ponce Brisker., MD  telmisartan (MICARDIS) 40 MG tablet Take 40 mg by mouth daily. 08/14/19   [provider]  traZODone  (DESYREL ) 50 MG tablet Take 1 tablet (50 mg total) by  mouth at bedtime. 12/03/14   Rosa College D, MD  triazolam  (HALCION ) 0.25 MG tablet TAKE 1 TABLET (0.25 MG TOTAL) BY MOUTH AT BEDTIME AS NEEDED FOR SLEEP. 05/26/21   Rosa College D, MD  vitamin E  400 UNIT capsule Take 400 Units by mouth daily.     [provider]    Allergies: Acetaminophen , Amlodipine, Bactrim  [sulfamethoxazole -trimethoprim ], Codeine, Fexofenadine, Ibuprofen , Lovenox [enoxaparin sodium], Naproxen, Prednisone, Shellfish allergy, and Sonata  [zaleplon ]    Review of Systems  Cardiovascular:  Positive for chest pain.    Updated Vital Signs BP  126/75   Pulse (!) 58   Temp 98.2 F (36.8 C)   Resp (!) 21   Ht 5' 5 (1.651 m)   Wt 89.4 kg   SpO2 95%   BMI 32.78 kg/m   Physical Exam Vitals reviewed.  Constitutional:      General: He is not in acute distress.    Appearance: He is well-developed.  HENT:     Head:     Comments: Occipital hematoma without open wound. Neck:     Comments: No midline cervical tenderness or swelling. Cardiovascular:     Rate and Rhythm: Normal rate. Rhythm irregular.     Heart sounds: No murmur heard. Pulmonary:     Effort: Pulmonary effort is normal.     Breath sounds: Rales (basilar) present. No wheezing or rhonchi.  Abdominal:     Palpations: Abdomen is soft.     Comments: Tender across upper abdomen without guarding. Soft abdomen. Nondistended.   Musculoskeletal:     Cervical back: Normal range of motion and neck supple.     Comments: 3+ pitting edema bilateral LE's.    Neurological:     Mental Status: He is oriented to person, place, and time.     GCS: GCS eye subscore is 4. GCS verbal subscore is 5. GCS motor subscore is 6.     Cranial Nerves: Cranial nerves 2-12 are intact.     Sensory: Sensation is intact.     Motor: No weakness or pronator drift.     (all labs ordered are listed, but only abnormal results are displayed) Labs Reviewed  BASIC METABOLIC PANEL WITH GFR - Abnormal; Notable for the following components:      Result Value   Chloride 95 (*)    Glucose, Bld 166 (*)    Calcium 8.8 (*)    GFR, Estimated 56 (*)    All other components within normal limits  CBC - Abnormal; Notable for the following components:   RBC 4.00 (*)    Hemoglobin 12.7 (*)    MCV 100.3 (*)    All other components within normal limits  PRO BRAIN NATRIURETIC PEPTIDE - Abnormal; Notable for the following components:   Pro Brain Natriuretic Peptide 2,422.0 (*)    All other components within normal limits  URINALYSIS, ROUTINE W REFLEX MICROSCOPIC - Abnormal; Notable for the following  components:   Protein, ur 100 (*)    All other components within normal limits  URINALYSIS, MICROSCOPIC (REFLEX) - Abnormal; Notable for the following components:   Bacteria, UA RARE (*)    All other components within normal limits  TROPONIN T, HIGH SENSITIVITY - Abnormal; Notable for the following components:   Troponin T High Sensitivity 38 (*)    All other components within normal limits  TROPONIN T, HIGH SENSITIVITY - Abnormal; Notable for the following components:   Troponin T High Sensitivity 36 (*)    All other components within  normal limits  PROTIME-INR   Results for orders placed or performed during the hospital encounter of 05/24/24  Basic metabolic panel   Collection Time: 05/24/24  5:00 PM  Result Value Ref Range   Sodium 137 135 - 145 mmol/L   Potassium 3.9 3.5 - 5.1 mmol/L   Chloride 95 (L) 98 - 111 mmol/L   CO2 31 22 - 32 mmol/L   Glucose, Bld 166 (H) 70 - 99 mg/dL   BUN 16 8 - 23 mg/dL   Creatinine, Ser 4.09 0.61 - 1.24 mg/dL   Calcium 8.8 (L) 8.9 - 10.3 mg/dL   GFR, Estimated 56 (L) >60 mL/min   Anion gap 12 5 - 15  CBC   Collection Time: 05/24/24  5:00 PM  Result Value Ref Range   WBC 9.6 4.0 - 10.5 K/uL   RBC 4.00 (L) 4.22 - 5.81 MIL/uL   Hemoglobin 12.7 (L) 13.0 - 17.0 g/dL   HCT 81.1 91.4 - 78.2 %   MCV 100.3 (H) 80.0 - 100.0 fL   MCH 31.8 26.0 - 34.0 pg   MCHC 31.7 30.0 - 36.0 g/dL   RDW 95.6 21.3 - 08.6 %   Platelets 154 150 - 400 K/uL   nRBC 0.0 0.0 - 0.2 %  Troponin T, High Sensitivity   Collection Time: 05/24/24  5:00 PM  Result Value Ref Range   Troponin T High Sensitivity 38 (H) <19 ng/L  Pro Brain natriuretic peptide   Collection Time: 05/24/24  5:02 PM  Result Value Ref Range   Pro Brain Natriuretic Peptide 2,422.0 (H) <300.0 pg/mL  Protime-INR   Collection Time: 05/24/24  6:01 PM  Result Value Ref Range   Prothrombin Time 13.6 11.4 - 15.2 seconds   INR 1.0 0.8 - 1.2  Urinalysis, Routine w reflex microscopic -Urine, Clean Catch    Collection Time: 05/24/24  7:30 PM  Result Value Ref Range   Color, Urine YELLOW YELLOW   APPearance CLEAR CLEAR   Specific Gravity, Urine 1.015 1.005 - 1.030   pH 7.5 5.0 - 8.0   Glucose, UA NEGATIVE NEGATIVE mg/dL   Hgb urine dipstick NEGATIVE NEGATIVE   Bilirubin Urine NEGATIVE NEGATIVE   Ketones, ur NEGATIVE NEGATIVE mg/dL   Protein, ur 578 (A) NEGATIVE mg/dL   Nitrite NEGATIVE NEGATIVE   Leukocytes,Ua NEGATIVE NEGATIVE  Urinalysis, Microscopic (reflex)   Collection Time: 05/24/24  7:30 PM  Result Value Ref Range   RBC / HPF 0-5 0 - 5 RBC/hpf   WBC, UA NONE SEEN 0 - 5 WBC/hpf   Bacteria, UA RARE (A) NONE SEEN   Squamous Epithelial / HPF 0-5 0 - 5 /HPF  Troponin T, High Sensitivity   Collection Time: 05/24/24  7:30 PM  Result Value Ref Range   Troponin T High Sensitivity 36 (H) <19 ng/L     EKG: None  Radiology: CT T-SPINE NO CHARGE Result Date: 05/24/2024 CLINICAL DATA:  Back pain after fall EXAM: CT Thoracic Spine without contrast TECHNIQUE: Multiplanar CT images of the thoracic spine were reconstructed from contemporary CT of the Chest. RADIATION DOSE REDUCTION: This exam was performed according to the departmental dose-optimization program which includes automated exposure control, adjustment of the mA and/or kV according to patient size and/or use of iterative reconstruction technique. CONTRAST:  None or No additional COMPARISON:  None Available. FINDINGS: Alignment: Mild S shaped scoliosis of the spine. Sagittal alignment is within normal limits. Vertebrae: Vertebral body heights are maintained. No definitive fracture is seen. Paraspinal and  other soft tissues: No definite acute paravertebral or paraspinal soft tissue abnormality. Disc levels: Advanced multilevel degenerative changes with multilevel degenerative osteophytes and multilevel disc space narrowing. Increased disc height at T5-T6 compared with several levels above and below. IMPRESSION: 1. Advanced multilevel  degenerative changes. Vertebral body heights are maintained and no definitive fracture lucency is seen. 2. Increased disc height at T5-T6 compared with several adjacent levels above and below, with overall rigid appearance of spine due to advanced degenerative changes and multilevel osteophytes. Correlate for focal symptoms to the region, MRI could be obtained if presentation suggests potential ligamentous injury. Electronically Signed   By: Esmeralda Hedge M.D.   On: 05/24/2024 20:21   CT CHEST ABDOMEN PELVIS W CONTRAST Result Date: 05/24/2024 CLINICAL DATA:  Marvell Slider at home syncope EXAM: CT CHEST, ABDOMEN, AND PELVIS WITH CONTRAST TECHNIQUE: Multidetector CT imaging of the chest, abdomen and pelvis was performed following the standard protocol during bolus administration of intravenous contrast. RADIATION DOSE REDUCTION: This exam was performed according to the departmental dose-optimization program which includes automated exposure control, adjustment of the mA and/or kV according to patient size and/or use of iterative reconstruction technique. CONTRAST:  OMNIPAQUE  IOHEXOL  300 MG/ML  SOLN COMPARISON:  CT 04/16/2019 FINDINGS: CT CHEST FINDINGS Cardiovascular: Moderate aortic atherosclerosis. Post CABG changes. Ascending aortic diameter up to 4.1 cm. Coronary vascular calcification. Mild cardiomegaly. No pericardial effusion Mediastinum/Nodes: Patent trachea. No thyroid  mass. No suspicious lymph nodes. Esophagus within normal limits. Lungs/Pleura: No acute airspace disease, pleural effusion, or pneumothorax. Bleb at the medial right base. Musculoskeletal: Sternotomy.  See separately dictated spine CT CT ABDOMEN PELVIS FINDINGS Hepatobiliary: No focal liver abnormality is seen. No gallstones, gallbladder wall thickening, or biliary dilatation. Pancreas: Unremarkable. No pancreatic ductal dilatation or surrounding inflammatory changes. Spleen: Inhomogeneous enhancement probably due to phase of enhancement.  Adrenals/Urinary Tract: Adrenal glands are normal. The kidneys show no hydronephrosis. Renal cysts for which no imaging follow-up is recommended. The bladder is normal Stomach/Bowel: Stomach nonenlarged. No dilated small bowel. No acute bowel wall thickening. Mild diverticular disease of the colon Vascular/Lymphatic: Aortic atherosclerosis. No enlarged abdominal or pelvic lymph nodes. Reproductive: Markedly enlarged prostate Other: Negative for pelvic effusion or free air. Small fat containing right inguinal hernia. Small fat containing umbilical hernia Musculoskeletal: Right hip replacement. Multilevel degenerative changes. No acute osseous abnormality. IMPRESSION: 1. Negative for acute intrathoracic, abdominal, or pelvic abnormality. 2. Mild diverticular disease of the colon without acute inflammatory process. 3. Markedly enlarged prostate. 4. Aortic atherosclerosis. Ascending aortic diameter up to 4.1 cm. Recommend annual imaging followup by CTA or MRA. This recommendation follows 2010 ACCF/AHA/AATS/ACR/ASA/SCA/SCAI/SIR/STS/SVM Guidelines for the Diagnosis and Management of Patients with Thoracic Aortic Disease. Circulation. 2010; 121: Z610-R604. Aortic aneurysm NOS (ICD10-I71.9) Aortic Atherosclerosis (ICD10-I70.0). Electronically Signed   By: Esmeralda Hedge M.D.   On: 05/24/2024 20:21   CT Head Wo Contrast Result Date: 05/24/2024 EXAM: CT HEAD AND CERVICAL SPINE 05/24/2024 07:11:30 PM TECHNIQUE: CT of the head and cervical spine was performed without the administration of intravenous contrast. Multiplanar reformatted images are provided for review. Automated exposure control, iterative reconstruction, and/or weight based adjustment of the mA/kV was utilized to reduce the radiation dose to as low as reasonably achievable. COMPARISON: None available. CLINICAL HISTORY: 89 y/o male. Patient suffered fall after syncopal episode today. Hit back of head. FINDINGS: CT HEAD BRAIN AND VENTRICLES: No acute intracranial  hemorrhage. No mass effect or midline shift. No abnormal extra-axial fluid collection. Gray-white differentiation is maintained. No hydrocephalus. Global cortical  atrophy. Subcortical and periventricular small vessel ischemic changes. Intracranial atherosclerosis. ORBITS: No acute abnormality. SINUSES AND MASTOIDS: No acute abnormality. SOFT TISSUES AND SKULL: No acute skull fracture. Mild soft tissue swelling overlying the right posterior vertex (sagittal image 133). CT CERVICAL SPINE BONES AND ALIGNMENT: No acute fracture or traumatic malalignment. DEGENERATIVE CHANGES: Moderate degenerative changes of the cervical spine with narrowing of the spinal canal, most prominent at C3-4. SOFT TISSUES: No prevertebral soft tissue swelling. IMPRESSION: 1. Mild soft tissue swelling overlying the right posterior vertex. No acute intracranial abnormality. Atrophy with small vessel ischemic changes. 2. No traumatic injury to the cervical spine. Moderate degenerative changes, most prominent at C3-4. Electronically signed by: Zadie Herter MD 05/24/2024 07:38 PM EDT RP Workstation: [id number]   CT Cervical Spine Wo Contrast Result Date: 05/24/2024 EXAM: CT HEAD AND CERVICAL SPINE 05/24/2024 07:11:30 PM TECHNIQUE: CT of the head and cervical spine was performed without the administration of intravenous contrast. Multiplanar reformatted images are provided for review. Automated exposure control, iterative reconstruction, and/or weight based adjustment of the mA/kV was utilized to reduce the radiation dose to as low as reasonably achievable. COMPARISON: None available. CLINICAL HISTORY: 89 y/o male. Patient suffered fall after syncopal episode today. Hit back of head. FINDINGS: CT HEAD BRAIN AND VENTRICLES: No acute intracranial hemorrhage. No mass effect or midline shift. No abnormal extra-axial fluid collection. Gray-white differentiation is maintained. No hydrocephalus. Global cortical atrophy. Subcortical and  periventricular small vessel ischemic changes. Intracranial atherosclerosis. ORBITS: No acute abnormality. SINUSES AND MASTOIDS: No acute abnormality. SOFT TISSUES AND SKULL: No acute skull fracture. Mild soft tissue swelling overlying the right posterior vertex (sagittal image 133). CT CERVICAL SPINE BONES AND ALIGNMENT: No acute fracture or traumatic malalignment. DEGENERATIVE CHANGES: Moderate degenerative changes of the cervical spine with narrowing of the spinal canal, most prominent at C3-4. SOFT TISSUES: No prevertebral soft tissue swelling. IMPRESSION: 1. Mild soft tissue swelling overlying the right posterior vertex. No acute intracranial abnormality. Atrophy with small vessel ischemic changes. 2. No traumatic injury to the cervical spine. Moderate degenerative changes, most prominent at C3-4. Electronically signed by: Zadie Herter MD 05/24/2024 07:38 PM EDT RP Workstation: [id number]     Procedures   Medications Ordered in the ED  furosemide (LASIX) injection 20 mg (has no administration in time range)  iohexol  (OMNIPAQUE ) 300 MG/ML solution 100 mL (100 mLs Intravenous Contrast Given 05/24/24 1857)                                    Medical Decision Making This patient presents to the ED for concern of cough syncope, this involves an extensive number of treatment options, and is a complaint that carries with it a high risk of complications and morbidity.  The differential diagnosis includes ACS, arrhythmia, CVA, hypoxia   Co morbidities that complicate the patient evaluation  Advanced age, HLD, HTN, GERD, BPH, thrombocytopenia, CAD, CHF, OSA w/CPAP   Additional history obtained:  Additional history and/or information obtained from chart review, notable for Last seen by cardiology (P. [place]) 05/04/24. No documented history of atrial fibrillation.    Lab Tests:  I Ordered, and personally interpreted labs.  The pertinent results include:   Troponin 38 --> 36 Pro BNP  2422 CBC: WBC 9.6, hgb 12.7, plts 154 Bmet: normal renal function   Imaging Studies ordered:  I ordered imaging studies including CT chest/abd/pel with T-spine at no charge: Per radiologist interpretation: Thoracic  spine:  IMPRESSION: 1. Advanced multilevel degenerative changes. Vertebral body heights are maintained and no definitive fracture lucency is seen. 2. Increased disc height at T5-T6 compared with several adjacent levels above and below, with overall rigid appearance of spine due to advanced degenerative changes and multilevel osteophytes. Correlate for focal symptoms to the region, MRI could be obtained if presentation suggests potential ligamentous injury.  Chest/abd/pel:   IMPRESSION: 1. Negative for acute intrathoracic, abdominal, or pelvic abnormality. 2. Mild diverticular disease of the colon without acute inflammatory process. 3. Markedly enlarged prostate. 4. Aortic atherosclerosis. Ascending aortic diameter up to 4.1 cm. Recommend annual imaging followup by CTA or MRA. This recommendation follows 2010 ACCF/AHA/AATS/ACR/ASA/SCA/SCAI/SIR/STS/SVM Guidelines for the Diagnosis and Management of Patients with Thoracic Aortic Disease. Circulation. 2010; 121: Z610-R604. Aortic aneurysm NOS (ICD10-I71.9)    Cardiac Monitoring:  The patient was maintained on a cardiac monitor.  I personally viewed and interpreted the cardiac monitored which showed an underlying rhythm of: Atrial fib, anterolateral ST changes   Medicines ordered and prescription drug management:  I ordered medication including patient decline  for na/ Reevaluation of the patient after these medicines showed that the patient: no medications I have reviewed the patients home medicines and have made adjustments as needed   Test Considered:  N/a   Critical Interventions:  N/a   Consultations Obtained:  I requested consultation with the cardiology and discussed lab and imaging findings  as well as pertinent plan - they recommend: admit to medicine, they will provide inpatient consult. Hospitalist paged.   Problem List / ED Course:  Patient to ED after cough syncopal episode this morning Reports head injury, and pain around mid-section, worse with breathing, since fall/syncope Found to be in a-fib, normal rate, no history of same. Last visit with cardiology 5/30 Found to have significant pitting edema bilateral LE's - BNP >2400. Has had progressive SOB/DOE. Takes Lasix intermittently only - feels it lends to constipation. Per cardiology office visit ([place]): In 2020 evaluation for edema with lab work including Chemistries, TSH, CBC,UA,  and BNP which were all normal except chronically low platelets. LE dopplers were negative for DVT. Echo was fairly unremarkable with gr 2 diastolic dysfunction and mild pulmonary HTN. EF was normal. Patient is comfortable appearing. Declines pain medication  He will need admission for multifactoral presentation: 1 - new onset atrial fibrillation 2 - fluid overload 3 - syncopal event in 89 yo  CT chest/abd/pel and t-spine, negative for acute injury.   Cardiology paged who advises: admit to medicine Hospitalist paged for admission, cardiology to provide inpatient consultation.    Reevaluation:  After the interventions noted above, I reevaluated the patient and found that they have :stayed the same   Social Determinants of Health:  Lives with wife, never a smoker   Disposition:  After consideration of the diagnostic results and the patients response to treatment, I feel that the patient would benefit from admit.   Amount and/or Complexity of Data Reviewed Labs: ordered. Radiology: ordered.  Risk Prescription drug management. Decision regarding hospitalization.        Final diagnoses:  New onset atrial fibrillation [facility])  DOE (dyspnea on exertion)  Peripheral edema    ED Discharge Orders     None           Rama Burkitt 05/24/24 2126    Afton Horse T, DO 05/24/24 2251

## 2024-05-25 ENCOUNTER — Encounter (HOSPITAL_COMMUNITY): Payer: Self-pay | Admitting: Internal Medicine

## 2024-05-25 ENCOUNTER — Inpatient Hospital Stay (HOSPITAL_COMMUNITY)

## 2024-05-25 DIAGNOSIS — I2510 Atherosclerotic heart disease of native coronary artery without angina pectoris: Secondary | ICD-10-CM

## 2024-05-25 DIAGNOSIS — E7800 Pure hypercholesterolemia, unspecified: Secondary | ICD-10-CM | POA: Diagnosis not present

## 2024-05-25 DIAGNOSIS — I5033 Acute on chronic diastolic (congestive) heart failure: Secondary | ICD-10-CM | POA: Diagnosis not present

## 2024-05-25 DIAGNOSIS — N400 Enlarged prostate without lower urinary tract symptoms: Secondary | ICD-10-CM

## 2024-05-25 DIAGNOSIS — I4891 Unspecified atrial fibrillation: Secondary | ICD-10-CM | POA: Diagnosis not present

## 2024-05-25 DIAGNOSIS — D649 Anemia, unspecified: Secondary | ICD-10-CM

## 2024-05-25 DIAGNOSIS — J449 Chronic obstructive pulmonary disease, unspecified: Secondary | ICD-10-CM | POA: Diagnosis not present

## 2024-05-25 DIAGNOSIS — G4733 Obstructive sleep apnea (adult) (pediatric): Secondary | ICD-10-CM

## 2024-05-25 DIAGNOSIS — R9431 Abnormal electrocardiogram [ECG] [EKG]: Secondary | ICD-10-CM | POA: Diagnosis not present

## 2024-05-25 DIAGNOSIS — I440 Atrioventricular block, first degree: Secondary | ICD-10-CM | POA: Diagnosis not present

## 2024-05-25 DIAGNOSIS — Z6832 Body mass index (BMI) 32.0-32.9, adult: Secondary | ICD-10-CM | POA: Diagnosis not present

## 2024-05-25 DIAGNOSIS — Z8249 Family history of ischemic heart disease and other diseases of the circulatory system: Secondary | ICD-10-CM | POA: Diagnosis not present

## 2024-05-25 DIAGNOSIS — K219 Gastro-esophageal reflux disease without esophagitis: Secondary | ICD-10-CM | POA: Diagnosis not present

## 2024-05-25 DIAGNOSIS — Z79899 Other long term (current) drug therapy: Secondary | ICD-10-CM | POA: Diagnosis not present

## 2024-05-25 DIAGNOSIS — I272 Pulmonary hypertension, unspecified: Secondary | ICD-10-CM | POA: Diagnosis not present

## 2024-05-25 DIAGNOSIS — E669 Obesity, unspecified: Secondary | ICD-10-CM

## 2024-05-25 DIAGNOSIS — F419 Anxiety disorder, unspecified: Secondary | ICD-10-CM

## 2024-05-25 DIAGNOSIS — I5031 Acute diastolic (congestive) heart failure: Secondary | ICD-10-CM

## 2024-05-25 DIAGNOSIS — Z91013 Allergy to seafood: Secondary | ICD-10-CM | POA: Diagnosis not present

## 2024-05-25 DIAGNOSIS — R55 Syncope and collapse: Secondary | ICD-10-CM | POA: Diagnosis not present

## 2024-05-25 DIAGNOSIS — Z881 Allergy status to other antibiotic agents status: Secondary | ICD-10-CM | POA: Diagnosis not present

## 2024-05-25 DIAGNOSIS — D539 Nutritional anemia, unspecified: Secondary | ICD-10-CM | POA: Diagnosis not present

## 2024-05-25 DIAGNOSIS — I441 Atrioventricular block, second degree: Secondary | ICD-10-CM | POA: Diagnosis not present

## 2024-05-25 DIAGNOSIS — I10 Essential (primary) hypertension: Secondary | ICD-10-CM

## 2024-05-25 DIAGNOSIS — I5032 Chronic diastolic (congestive) heart failure: Secondary | ICD-10-CM

## 2024-05-25 DIAGNOSIS — Z7982 Long term (current) use of aspirin: Secondary | ICD-10-CM | POA: Diagnosis not present

## 2024-05-25 DIAGNOSIS — K5909 Other constipation: Secondary | ICD-10-CM | POA: Diagnosis not present

## 2024-05-25 DIAGNOSIS — Z85820 Personal history of malignant melanoma of skin: Secondary | ICD-10-CM | POA: Diagnosis not present

## 2024-05-25 DIAGNOSIS — Z951 Presence of aortocoronary bypass graft: Secondary | ICD-10-CM | POA: Diagnosis not present

## 2024-05-25 DIAGNOSIS — I110 Hypertensive heart disease with heart failure: Secondary | ICD-10-CM | POA: Diagnosis not present

## 2024-05-25 DIAGNOSIS — Z888 Allergy status to other drugs, medicaments and biological substances status: Secondary | ICD-10-CM | POA: Diagnosis not present

## 2024-05-25 LAB — COMPREHENSIVE METABOLIC PANEL WITH GFR
ALT: 13 U/L (ref 0–44)
AST: 20 U/L (ref 15–41)
Albumin: 3.5 g/dL (ref 3.5–5.0)
Alkaline Phosphatase: 69 U/L (ref 38–126)
Anion gap: 9 (ref 5–15)
BUN: 13 mg/dL (ref 8–23)
CO2: 37 mmol/L — ABNORMAL HIGH (ref 22–32)
Calcium: 9.1 mg/dL (ref 8.9–10.3)
Chloride: 91 mmol/L — ABNORMAL LOW (ref 98–111)
Creatinine, Ser: 1.17 mg/dL (ref 0.61–1.24)
GFR, Estimated: 58 mL/min — ABNORMAL LOW (ref >60)
Glucose, Bld: 114 mg/dL — ABNORMAL HIGH (ref 70–99)
Potassium: 3.9 mmol/L (ref 3.5–5.1)
Sodium: 137 mmol/L (ref 135–145)
Total Bilirubin: 1.6 mg/dL — ABNORMAL HIGH (ref 0.0–1.2)
Total Protein: 6.4 g/dL — ABNORMAL LOW (ref 6.5–8.1)

## 2024-05-25 LAB — ECHOCARDIOGRAM COMPLETE
AR max vel: 2.24 cm2
AV Peak grad: 7.1 mmHg
Ao pk vel: 1.33 m/s
Area-P 1/2: 4.29 cm2
Height: 65 in
S' Lateral: 2.8 cm
Weight: 3152 [oz_av]

## 2024-05-25 LAB — CBC
HCT: 42.8 % (ref 39.0–52.0)
Hemoglobin: 13.3 g/dL (ref 13.0–17.0)
MCH: 30.8 pg (ref 26.0–34.0)
MCHC: 31.1 g/dL (ref 30.0–36.0)
MCV: 99.1 fL (ref 80.0–100.0)
Platelets: 142 10*3/uL — ABNORMAL LOW (ref 150–400)
RBC: 4.32 MIL/uL (ref 4.22–5.81)
RDW: 13.4 % (ref 11.5–15.5)
WBC: 7.4 10*3/uL (ref 4.0–10.5)
nRBC: 0 % (ref 0.0–0.2)

## 2024-05-25 LAB — TSH: TSH: 2.185 u[IU]/mL (ref 0.350–4.500)

## 2024-05-25 LAB — MAGNESIUM: Magnesium: 2.1 mg/dL (ref 1.7–2.4)

## 2024-05-25 MED ORDER — ALBUTEROL SULFATE HFA 108 (90 BASE) MCG/ACT IN AERS: 1.0000 | INHALATION_SPRAY | Freq: Four times a day (QID) | RESPIRATORY_TRACT | Status: DC | PRN

## 2024-05-25 MED ORDER — TAMSULOSIN HCL 0.4 MG PO CAPS
0.4000 mg | ORAL_CAPSULE | Freq: Every day | ORAL | Status: DC
  Administered 2024-05-25 – 2024-05-26 (×2): 0.4 mg via ORAL
  Filled 2024-05-25 (×2): qty 1

## 2024-05-25 MED ORDER — FLUTICASONE FUROATE-VILANTEROL 100-25 MCG/ACT IN AEPB: 1.0000 | INHALATION_SPRAY | Freq: Every day | RESPIRATORY_TRACT | Status: DC

## 2024-05-25 MED ORDER — ALBUTEROL SULFATE (2.5 MG/3ML) 0.083% IN NEBU: 2.5000 mg | INHALATION_SOLUTION | Freq: Four times a day (QID) | RESPIRATORY_TRACT | Status: DC | PRN

## 2024-05-25 MED ORDER — SODIUM CHLORIDE 0.9% FLUSH
3.0000 mL | Freq: Two times a day (BID) | INTRAVENOUS | Status: DC
  Administered 2024-05-25 – 2024-05-27 (×4): 3 mL via INTRAVENOUS

## 2024-05-25 MED ORDER — POTASSIUM CHLORIDE CRYS ER 10 MEQ PO TBCR
10.0000 meq | EXTENDED_RELEASE_TABLET | Freq: Two times a day (BID) | ORAL | Status: DC
  Administered 2024-05-25 – 2024-05-27 (×4): 10 meq via ORAL
  Filled 2024-05-25 (×6): qty 1

## 2024-05-25 MED ORDER — FLUTICASONE FUROATE-VILANTEROL 100-25 MCG/ACT IN AEPB
1.0000 | INHALATION_SPRAY | Freq: Every day | RESPIRATORY_TRACT | Status: DC
  Administered 2024-05-26 – 2024-05-27 (×2): 1 via RESPIRATORY_TRACT
  Filled 2024-05-25: qty 28

## 2024-05-25 MED ORDER — TRAZODONE HCL 50 MG PO TABS
50.0000 mg | ORAL_TABLET | Freq: Every day | ORAL | Status: DC
  Administered 2024-05-25 – 2024-05-26 (×2): 50 mg via ORAL
  Filled 2024-05-25 (×2): qty 1

## 2024-05-25 MED ORDER — FUROSEMIDE 10 MG/ML IJ SOLN
20.0000 mg | Freq: Two times a day (BID) | INTRAMUSCULAR | Status: DC
  Administered 2024-05-25 – 2024-05-26 (×2): 20 mg via INTRAVENOUS
  Filled 2024-05-25 (×2): qty 2

## 2024-05-25 MED ORDER — POTASSIUM CHLORIDE ER 10 MEQ PO TBCR: 10.0000 meq | EXTENDED_RELEASE_TABLET | Freq: Two times a day (BID) | ORAL | Status: DC

## 2024-05-25 MED ORDER — ASPIRIN 81 MG PO CHEW
81.0000 mg | CHEWABLE_TABLET | Freq: Every evening | ORAL | Status: DC
  Administered 2024-05-25 – 2024-05-26 (×2): 81 mg via ORAL
  Filled 2024-05-25 (×2): qty 1

## 2024-05-25 MED ORDER — POLYETHYLENE GLYCOL 3350 17 G PO PACK
17.0000 g | PACK | Freq: Every day | ORAL | Status: DC
  Administered 2024-05-25: 17 g via ORAL
  Filled 2024-05-25: qty 1

## 2024-05-25 MED ORDER — FUROSEMIDE 10 MG/ML IJ SOLN
20.0000 mg | Freq: Once | INTRAMUSCULAR | Status: AC
  Administered 2024-05-25: 20 mg via INTRAVENOUS
  Filled 2024-05-25: qty 2

## 2024-05-25 MED ORDER — PRAVASTATIN SODIUM 20 MG PO TABS: 20.0000 mg | ORAL_TABLET | Freq: Every day | ORAL | Status: DC

## 2024-05-25 MED ORDER — POLYETHYLENE GLYCOL 3350 17 G PO PACK: 17.0000 g | PACK | Freq: Every day | ORAL | Status: DC | PRN

## 2024-05-25 MED ORDER — PRAVASTATIN SODIUM 10 MG PO TABS
20.0000 mg | ORAL_TABLET | Freq: Every day | ORAL | Status: DC
  Administered 2024-05-25 – 2024-05-26 (×2): 20 mg via ORAL
  Filled 2024-05-25 (×2): qty 2

## 2024-05-25 MED ORDER — PANTOPRAZOLE SODIUM 20 MG PO TBEC
20.0000 mg | DELAYED_RELEASE_TABLET | Freq: Every day | ORAL | Status: DC
  Administered 2024-05-26 – 2024-05-27 (×2): 20 mg via ORAL
  Filled 2024-05-25 (×2): qty 1

## 2024-05-25 NOTE — ED Notes (Signed)
 Family updated as to patient's status per pt request.

## 2024-05-25 NOTE — Consult Note (Addendum)
 Cardiology Consultation   Patient ID: Scott [name] MRN: [id number]; DOB: Oct 21, 1928  Admit date: 05/24/2024 Date of Consult: 05/25/2024  PCP:  [facility] Providers Cardiologist:  Peter Swaziland, MD        Patient Profile: Scott [name] is a 89 y.o. male with a hx of CAD s/p CABG in 2007 (LIMA to LAD, SVG to OM 2, SVG to Acute marginal and posterior lateral branch), Hypertension, Hyperlipidemia, GERD, COPD, and OSA who is being seen 05/25/2024 for the evaluation of CHF and AF at the request of Chalmers Columbus MD.  History of Present Illness: Scott [name] is a 89 year old male with the past medical history above who follows with [facility]. His last stress imaging was in 2010 with a negative Myoview . In 2020 he had an echocardiogram due to peripheral edema and his risk factors which showed EF 60-65% with normal LV systolic function, mild LVH and impaired relaxation. Mildly enlarged RV with normal systolic function. Normal RA and LA size. Mild MR. Mildly pulmonary hypertension.  He was last seen by Dr. Swaziland on 05/04/2024 and at that time he was reporting new SOB and peripheral edema. He was encouraged to start taking lasix daily, attempted to start an SGLT2i but was hindered by cost.   Presented to [facility] ED on 05/24/2024 after a syncopal episode that was precipitated by a coughing fit with associated shortness of breath. He did loose consciousness.  He did hit his head.  Initial ECG at [facility] was read as atrial fibrillation VR 53. BNP 2422. Troponin ( 38-> 36) Cr 1.21. Placed on 3L [place], does not use oxygen at home. Transferred to [facility].   CT head -soft tissue swelling overlying right posteior vertex no intracranial hemorrhage CT C/A/P -moderate aortic atherosclerosis with stable ascending aortic diameter 4.1cm. otherwise unremarkable. No pleural effusion  He noted for the last several weeks progressively worsening fatigue, shortness  of breath on exertion, and peripheral edema. Recently noting shortness of breath at rest and some abdominal distention/discomfort the last several days. He originally attributed that to his constipation. So he started taking extra Dulcolax and stopped taking his lasix as he feels it constipates him. He did not take his Lasix Monday, Tuesday, or Wednesday. He then had profuse diarrhea Wednesday night.  He denies chest pain, orthopnea and PND.  The syncopal episode was Thursday morning. When asked about his syncopal episode, he denied prodromal symptoms: Including lightheadedness/dizziness, palpitations, and chest pain.  He stated it felt very similar to another time he passed out when he beared down on the toilet.  He stopped using his CPAP 3 years ago.  He shared that his wife recently passed in May. He denies history of atrial fibrillation.   Patient reports good urine output with current lasix dose and does notice improvement in his shortness of breath.   Past Medical History:  Diagnosis Date   Acute bronchitis 07/11/2012   Acute pancreatitis 04/17/2019   Anemia    Acute blood loss anemia secondary to surgery, followed fr/ Hip replacement    Anxiety    pt. reports that he has occas. nervous episodes    BPH (benign prostatic hyperplasia)    CAD (coronary artery disease)    s/p CABG in 2007  CARDILOLOGIST IS DR. P. Swaziland   Cancer (HCC)    melanoma - removed L ear - 2010   Cranial neuropathy    resolved    Generalized OA  GERD (gastroesophageal reflux disease)    H/O echocardiogram    last echo, stress test 05/2012- pt. followed by [facility]    Hypercholesterolemia    Hypertension    Obesity    Osteoarthritis    End-stage osteoarthritis, right hip   Sleep apnea    with use of CPAP   SOB (shortness of breath)    WITH EXERTION   Thrombocytopenia (HCC)    possibly due to Lovenox    Past Surgical History:  Procedure Laterality Date   ACHILLES TENDON SURGERY  10/06/2012    Procedure: ACHILLES TENDON REPAIR;  Surgeon: Forbes Ida., MD;  Location: [facility] OR;  Service: Orthopedics;  Laterality: Right;  REPAIR RIGHT RUPTURE ACHILLES TENDON PRIMARY OPEN/PERCUTANEOUS, EXCISION PARTIAL BONE TALUS/CANCANEUS, REPAIR PARTIAL EXCISION CALCANEUS   APPENDECTOMY     ARTERY BIOPSY     temporal - L side- wnl, double vision treated /w prednisone - 1991   CALCANEAL OSTEOTOMY  10/06/2012   Procedure: CALCANEAL OSTEOTOMY;  Surgeon: Forbes Ida., MD;  Location: [facility] OR;  Service: Orthopedics;  Laterality: Right;   CARDIAC CATHETERIZATION  12/15/2005   NORMAL. EF 60%, angioplasty 1995   carpel tunnel surgery-bilateral     both hands    CORONARY ARTERY BYPASS GRAFT  2007   LIMA GRAFT TO THE LAD, SAPHENOUS VEIN GRAFT TO THE FIRST OBTUSE MARIGNAL, SAPHENOUS VEIN GRAFT TO THE SECOND OBTUSE MARGINAL AND A SAPHENOUS VEIN GRAFT TO THE ACUTE MARGINAL AND POSTERIOR LATERAL BRANCH   HERNIA REPAIR     I & D EXTREMITY  12/11/2012   Procedure: IRRIGATION AND DEBRIDEMENT EXTREMITY;  Surgeon: Timothy Ford, MD;  Location: [facility] OR;  Service: Orthopedics;  Laterality: Right;  Irrigation and Debridement achilles, wound closure, apply wound vac, place antibiotic beads   INGUINAL HERNIA REPAIR Left 05/10/2013   Procedure: HERNIA REPAIR INGUINAL ADULT;  Surgeon: Keitha Pata, MD;  Location: [facility] ORS;  Service: General;  Laterality: Left;   INSERTION OF MESH Left 05/10/2013   Procedure: INSERTION OF MESH;  Surgeon: Keitha Pata, MD;  Location: [facility] ORS;  Service: General;  Laterality: Left;   JOINT REPLACEMENT     knee  11/12   left arthroscopic   TOTAL HIP ARTHROPLASTY     right       Scheduled Meds:  aspirin   81 mg Oral QPM   fluticasone furoate-vilanterol  1 puff Inhalation Daily   furosemide  20 mg Intravenous BID   [START ON 05/26/2024] pantoprazole   20 mg Oral Daily   potassium chloride   10 mEq Oral BID   pravastatin   20 mg Oral q1800   sodium chloride  flush  3 mL Intravenous Q12H   tamsulosin   0.4  mg Oral QHS   traZODone   50 mg Oral QHS   Continuous Infusions:  PRN Meds: albuterol , polyethylene glycol  Allergies:    Allergies  Allergen Reactions   Acetaminophen      Other reaction(s): mouth sores   Amlodipine Swelling    Swelling of feet and legs   Bactrim  [Sulfamethoxazole -Trimethoprim ] Other (See Comments)    Constipation    Codeine Nausea Only   Fexofenadine     Other reaction(s): mouth sores   Ibuprofen      Other reaction(s): nervousness   Lovenox [Enoxaparin Sodium] Other (See Comments)    MAY HAVE CAUSED THROMBOCYTOPENIA   Naproxen     Other reaction(s): mouth sores   Prednisone Swelling    Patient has tolerated doses for colds. Thinks it caused swelling  in the past   Shellfish Allergy Nausea And Vomiting and Other (See Comments)    N&V, shaking   Sonata  [Zaleplon ] Other (See Comments)    Hyper feelings    Social History:   Social History   Socioeconomic History   Marital status: Widowed    Spouse name: Not on file   Number of children: 2   Years of education: Not on file   Highest education level: Not on file  Occupational History   Occupation: [employer]    Comment: retired  Tobacco Use   Smoking status: Never   Smokeless tobacco: Never  Vaping Use   Vaping status: Never Used  Substance and Sexual Activity   Alcohol use: No    Alcohol/week: 0.0 standard drinks of alcohol   Drug use: No   Sexual activity: Not Currently  Other Topics Concern   Not on file  Social History Narrative   Not on file   Social Drivers of Health   Financial Resource Strain: Not on file  Food Insecurity: Not on file  Transportation Needs: Not on file  Physical Activity: Not on file  Stress: Not on file  Social Connections: Not on file  Intimate Partner Violence: Not on file    Family History:    Family History  Problem Relation Age of Onset   Prostate cancer Brother    Heart disease Brother    Heart disease Brother      ROS:  Please see  the history of present illness.   All other ROS reviewed and negative.     Physical Exam/Data: Vitals:   05/25/24 1047 05/25/24 1200 05/25/24 1215 05/25/24 1434  BP:  121/66  102/61  Pulse:  (!) 145 (!) 58 64  Resp:  15 15 20   Temp: 98.7 F (37.1 C)   98.5 F (36.9 C)  TempSrc: Oral   Oral  SpO2:  100% 100% 97%  Weight:      Height:        Intake/Output Summary (Last 24 hours) at 05/25/2024 1740 Last data filed at 05/25/2024 1205 Gross per 24 hour  Intake --  Output 2300 ml  Net -2300 ml      05/24/2024    4:54 PM 05/04/2024   10:48 AM 10/26/2023   10:34 AM  Last 3 Weights  Weight (lbs) 197 lb 197 lb 12.8 oz 187 lb  Weight (kg) 89.359 kg 89.721 kg 84.823 kg     Body mass index is 32.78 kg/m.  General:  Well nourished, well developed, in no acute distress wearing nasal cannula HEENT: Ecchymosis to the occiput Neck: Elevated JVD Cardiac:  Irregular rhythm, regular rate, normal S1, S2; no murmur  Lungs:  clear to auscultation bilaterally though diminished at bases  Abd: Distended, nontender, no hepatomegaly  Ext: 2+ pitting edema BLE L>R Musculoskeletal:  No deformities  Skin: warm and dry  Neuro:  CNs 2-12 intact, no focal abnormalities noted Psych:  Normal affect   EKG:  The EKG was personally reviewed and demonstrates:  Initial ECG possible atrial fibrillation, possible sinus bradycardia with 1st degree AV block.  Telemetry:  Telemetry was personally reviewed and demonstrates:  Patient was recently moved to floor and attached so no telemetry at this time.   Relevant CV Studies: Echocardiogram 09/2019 IMPRESSIONS     1. Left ventricular ejection fraction, by visual estimation, is 60 to  65%. The left ventricle has normal function. Normal left ventricular size.  There is mildly increased left ventricular hypertrophy.  2. Left ventricular diastolic Doppler parameters are consistent with  impaired relaxation pattern of LV diastolic filling.   3. Global right  ventricle has normal systolic function.The right  ventricular size is mildly enlarged. No increase in right ventricular wall  thickness.   4. Left atrial size was normal.   5. Right atrial size was normal.   6. The mitral valve is normal in structure. Mild mitral valve  regurgitation. No evidence of mitral stenosis.   7. The tricuspid valve is normal in structure. Tricuspid valve  regurgitation is trivial.   8. The aortic valve is normal in structure. Aortic valve regurgitation  was not visualized by color flow Doppler. Structurally normal aortic  valve, with no evidence of sclerosis or stenosis.   9. The pulmonic valve was normal in structure. Pulmonic valve  regurgitation is trivial by color flow Doppler.  10. There is mild dilatation of the aortic root measuring 42 mm.  11. Mildly elevated pulmonary artery systolic pressure.  12. The tricuspid regurgitant velocity is 2.71 m/s, and with an assumed  right atrial pressure of 8 mmHg, the estimated right ventricular systolic  pressure is mildly elevated at 37.4 mmHg.  13. The inferior vena cava is normal in size with greater than 50%  respiratory variability, suggesting right atrial pressure of 3 mmHg.    Laboratory Data: High Sensitivity Troponin:  No results for input(s): TROPONINIHS in the last 720 hours.   Chemistry Recent Labs  Lab 05/24/24 1700 05/25/24 1601  NA 137 137  K 3.9 3.9  CL 95* 91*  CO2 31 37*  GLUCOSE 166* 114*  BUN 16 13  CREATININE 1.21 1.17  CALCIUM 8.8* 9.1  MG  --  2.1  GFRNONAA 56* 58*  ANIONGAP 12 9    Recent Labs  Lab 05/25/24 1601  PROT 6.4*  ALBUMIN 3.5  AST 20  ALT 13  ALKPHOS 69  BILITOT 1.6*   Lipids No results for input(s): CHOL, TRIG, HDL, LABVLDL, LDLCALC, CHOLHDL in the last 168 hours.  Hematology Recent Labs  Lab 05/24/24 1700 05/25/24 1601  WBC 9.6 7.4  RBC 4.00* 4.32  HGB 12.7* 13.3  HCT 40.1 42.8  MCV 100.3* 99.1  MCH 31.8 30.8  MCHC 31.7 31.1  RDW  13.5 13.4  PLT 154 142*   Thyroid   Recent Labs  Lab 05/25/24 1601  TSH 2.185    BNP Recent Labs  Lab 05/24/24 1702  PROBNP 2,422.0*    DDimer No results for input(s): DDIMER in the last 168 hours.  Radiology/Studies:  ECHOCARDIOGRAM COMPLETE Result Date: 05/25/2024    ECHOCARDIOGRAM REPORT   Patient Name:   Scott [name] Date of Exam: 05/25/2024 Medical Rec #:  [id number]       Height:       65.0 in Accession #:    [id number]      Weight:       197.0 lb Date of Birth:  11-27-1928       BSA:          1.965 m Patient Age:    89 years        BP:           102/61 mmHg Patient Gender: M               HR:           68 bpm. Exam Location:  Inpatient Procedure: 2D Echo, Cardiac Doppler and Color Doppler (Both Spectral and Color  Flow Doppler were utilized during procedure). Indications:    Atrial Fibrillation I48.91, CHF I50.31  History:        Patient has prior history of Echocardiogram examinations, most                 recent 09/10/2019. CHF, CAD, Signs/Symptoms:Dyspnea and Shortness                 of Breath; Risk Factors:Hypertension and Sleep Apnea.  Sonographer:    Terrilee Few RCS Referring Phys: [id number] LOGAN N LOCKWOOD IMPRESSIONS  1. Left ventricular ejection fraction, by estimation, is 60 to 65%. The left ventricle has normal function. The left ventricle has no regional wall motion abnormalities. Left ventricular diastolic parameters are indeterminate.  2. Right ventricular systolic function is normal. The right ventricular size is normal.  3. Left atrial size was moderately dilated.  4. Right atrial size was moderately dilated.  5. The mitral valve is abnormal. Mild mitral valve regurgitation. No evidence of mitral stenosis.  6. The aortic valve is tricuspid. There is moderate calcification of the aortic valve. Aortic valve regurgitation is trivial. Aortic valve sclerosis is present, with no evidence of aortic valve stenosis.  7. There is moderate dilatation of the ascending  aorta, measuring 44 mm.  8. The inferior vena cava is normal in size with greater than 50% respiratory variability, suggesting right atrial pressure of 3 mmHg. FINDINGS  Left Ventricle: Left ventricular ejection fraction, by estimation, is 60 to 65%. The left ventricle has normal function. The left ventricle has no regional wall motion abnormalities. Strain was performed and the global longitudinal strain is indeterminate. The left ventricular internal cavity size was normal in size. There is no left ventricular hypertrophy. Left ventricular diastolic parameters are indeterminate. Right Ventricle: The right ventricular size is normal. No increase in right ventricular wall thickness. Right ventricular systolic function is normal. Left Atrium: Left atrial size was moderately dilated. Right Atrium: Right atrial size was moderately dilated. Pericardium: There is no evidence of pericardial effusion. Mitral Valve: The mitral valve is abnormal. There is mild thickening of the mitral valve leaflet(s). There is mild calcification of the mitral valve leaflet(s). Mild mitral valve regurgitation. No evidence of mitral valve stenosis. Tricuspid Valve: The tricuspid valve is normal in structure. Tricuspid valve regurgitation is mild . No evidence of tricuspid stenosis. Aortic Valve: The aortic valve is tricuspid. There is moderate calcification of the aortic valve. Aortic valve regurgitation is trivial. Aortic valve sclerosis is present, with no evidence of aortic valve stenosis. Aortic valve peak gradient measures 7.1  mmHg. Pulmonic Valve: The pulmonic valve was normal in structure. Pulmonic valve regurgitation is not visualized. No evidence of pulmonic stenosis. Aorta: The aortic root is normal in size and structure. There is moderate dilatation of the ascending aorta, measuring 44 mm. Venous: The inferior vena cava is normal in size with greater than 50% respiratory variability, suggesting right atrial pressure of 3 mmHg.  IAS/Shunts: No atrial level shunt detected by color flow Doppler. Additional Comments: 3D was performed not requiring image post processing on an independent workstation and was indeterminate.  LEFT VENTRICLE PLAX 2D LVIDd:         4.60 cm   Diastology LVIDs:         2.80 cm   LV e' medial:    7.93 cm/s LV PW:         1.10 cm   LV E/e' medial:  18.9 LV IVS:  1.00 cm   LV e' lateral:   11.20 cm/s LVOT diam:     2.10 cm   LV E/e' lateral: 13.4 LV SV:         48 LV SV Index:   25 LVOT Area:     3.46 cm  RIGHT VENTRICLE            IVC RV S prime:     8.70 cm/s  IVC diam: 2.20 cm TAPSE (M-mode): 1.6 cm LEFT ATRIUM             Index        RIGHT ATRIUM           Index LA diam:        5.20 cm 2.65 cm/m   RA Area:     14.90 cm LA Vol (A2C):   57.2 ml 29.10 ml/m  RA Volume:   38.90 ml  19.79 ml/m LA Vol (A4C):   56.5 ml 28.75 ml/m LA Biplane Vol: 60.4 ml 30.73 ml/m  AORTIC VALVE AV Area (Vmax): 2.24 cm AV Vmax:        133.00 cm/s AV Peak Grad:   7.1 mmHg LVOT Vmax:      86.17 cm/s LVOT Vmean:     54.667 cm/s LVOT VTI:       0.140 m  AORTA Ao Root diam: 3.40 cm Ao Asc diam:  4.40 cm MITRAL VALVE                TRICUSPID VALVE MV Area (PHT): 4.29 cm     TR Peak grad:   38.7 mmHg MV Decel Time: 177 msec     TR Vmax:        311.00 cm/s MV E velocity: 150.00 cm/s                             SHUNTS                             Systemic VTI:  0.14 m                             Systemic Diam: 2.10 cm Janelle Mediate MD Electronically signed by Janelle Mediate MD Signature Date/Time: 05/25/2024/4:47:35 PM    Final    CT T-SPINE NO CHARGE Result Date: 05/24/2024 CLINICAL DATA:  Back pain after fall EXAM: CT Thoracic Spine without contrast TECHNIQUE: Multiplanar CT images of the thoracic spine were reconstructed from contemporary CT of the Chest. RADIATION DOSE REDUCTION: This exam was performed according to the departmental dose-optimization program which includes automated exposure control, adjustment of the mA and/or kV  according to patient size and/or use of iterative reconstruction technique. CONTRAST:  None or No additional COMPARISON:  None Available. FINDINGS: Alignment: Mild S shaped scoliosis of the spine. Sagittal alignment is within normal limits. Vertebrae: Vertebral body heights are maintained. No definitive fracture is seen. Paraspinal and other soft tissues: No definite acute paravertebral or paraspinal soft tissue abnormality. Disc levels: Advanced multilevel degenerative changes with multilevel degenerative osteophytes and multilevel disc space narrowing. Increased disc height at T5-T6 compared with several levels above and below. IMPRESSION: 1. Advanced multilevel degenerative changes. Vertebral body heights are maintained and no definitive fracture lucency is seen. 2. Increased disc height at T5-T6 compared with several adjacent levels above and below, with overall rigid appearance  of spine due to advanced degenerative changes and multilevel osteophytes. Correlate for focal symptoms to the region, MRI could be obtained if presentation suggests potential ligamentous injury. Electronically Signed   By: Esmeralda Hedge M.D.   On: 05/24/2024 20:21   CT CHEST ABDOMEN PELVIS W CONTRAST Result Date: 05/24/2024 CLINICAL DATA:  Scott [name] at home syncope EXAM: CT CHEST, ABDOMEN, AND PELVIS WITH CONTRAST TECHNIQUE: Multidetector CT imaging of the chest, abdomen and pelvis was performed following the standard protocol during bolus administration of intravenous contrast. RADIATION DOSE REDUCTION: This exam was performed according to the departmental dose-optimization program which includes automated exposure control, adjustment of the mA and/or kV according to patient size and/or use of iterative reconstruction technique. CONTRAST:  OMNIPAQUE  IOHEXOL  300 MG/ML  SOLN COMPARISON:  CT 04/16/2019 FINDINGS: CT CHEST FINDINGS Cardiovascular: Moderate aortic atherosclerosis. Post CABG changes. Ascending aortic diameter up to 4.1 cm.  Coronary vascular calcification. Mild cardiomegaly. No pericardial effusion Mediastinum/Nodes: Patent trachea. No thyroid  mass. No suspicious lymph nodes. Esophagus within normal limits. Lungs/Pleura: No acute airspace disease, pleural effusion, or pneumothorax. Bleb at the medial right base. Musculoskeletal: Sternotomy.  See separately dictated spine CT CT ABDOMEN PELVIS FINDINGS Hepatobiliary: No focal liver abnormality is seen. No gallstones, gallbladder wall thickening, or biliary dilatation. Pancreas: Unremarkable. No pancreatic ductal dilatation or surrounding inflammatory changes. Spleen: Inhomogeneous enhancement probably due to phase of enhancement. Adrenals/Urinary Tract: Adrenal glands are normal. The kidneys show no hydronephrosis. Renal cysts for which no imaging follow-up is recommended. The bladder is normal Stomach/Bowel: Stomach nonenlarged. No dilated small bowel. No acute bowel wall thickening. Mild diverticular disease of the colon Vascular/Lymphatic: Aortic atherosclerosis. No enlarged abdominal or pelvic lymph nodes. Reproductive: Markedly enlarged prostate Other: Negative for pelvic effusion or free air. Small fat containing right inguinal hernia. Small fat containing umbilical hernia Musculoskeletal: Right hip replacement. Multilevel degenerative changes. No acute osseous abnormality. IMPRESSION: 1. Negative for acute intrathoracic, abdominal, or pelvic abnormality. 2. Mild diverticular disease of the colon without acute inflammatory process. 3. Markedly enlarged prostate. 4. Aortic atherosclerosis. Ascending aortic diameter up to 4.1 cm. Recommend annual imaging followup by CTA or MRA. This recommendation follows 2010 ACCF/AHA/AATS/ACR/ASA/SCA/SCAI/SIR/STS/SVM Guidelines for the Diagnosis and Management of Patients with Thoracic Aortic Disease. Circulation. 2010; 121: Z610-R604. Aortic aneurysm NOS (ICD10-I71.9) Aortic Atherosclerosis (ICD10-I70.0). Electronically Signed   By: Esmeralda Hedge  M.D.   On: 05/24/2024 20:21   CT Head Wo Contrast Result Date: 05/24/2024 EXAM: CT HEAD AND CERVICAL SPINE 05/24/2024 07:11:30 PM TECHNIQUE: CT of the head and cervical spine was performed without the administration of intravenous contrast. Multiplanar reformatted images are provided for review. Automated exposure control, iterative reconstruction, and/or weight based adjustment of the mA/kV was utilized to reduce the radiation dose to as low as reasonably achievable. COMPARISON: None available. CLINICAL HISTORY: 89 y/o male. Patient suffered fall after syncopal episode today. Hit back of head. FINDINGS: CT HEAD BRAIN AND VENTRICLES: No acute intracranial hemorrhage. No mass effect or midline shift. No abnormal extra-axial fluid collection. Gray-white differentiation is maintained. No hydrocephalus. Global cortical atrophy. Subcortical and periventricular small vessel ischemic changes. Intracranial atherosclerosis. ORBITS: No acute abnormality. SINUSES AND MASTOIDS: No acute abnormality. SOFT TISSUES AND SKULL: No acute skull fracture. Mild soft tissue swelling overlying the right posterior vertex (sagittal image 133). CT CERVICAL SPINE BONES AND ALIGNMENT: No acute fracture or traumatic malalignment. DEGENERATIVE CHANGES: Moderate degenerative changes of the cervical spine with narrowing of the spinal canal, most prominent at C3-4. SOFT TISSUES: No prevertebral soft  tissue swelling. IMPRESSION: 1. Mild soft tissue swelling overlying the right posterior vertex. No acute intracranial abnormality. Atrophy with small vessel ischemic changes. 2. No traumatic injury to the cervical spine. Moderate degenerative changes, most prominent at C3-4. Electronically signed by: Zadie Herter MD 05/24/2024 07:38 PM EDT RP Workstation: [id number]   CT Cervical Spine Wo Contrast Result Date: 05/24/2024 EXAM: CT HEAD AND CERVICAL SPINE 05/24/2024 07:11:30 PM TECHNIQUE: CT of the head and cervical spine was performed without  the administration of intravenous contrast. Multiplanar reformatted images are provided for review. Automated exposure control, iterative reconstruction, and/or weight based adjustment of the mA/kV was utilized to reduce the radiation dose to as low as reasonably achievable. COMPARISON: None available. CLINICAL HISTORY: 89 y/o male. Patient suffered fall after syncopal episode today. Hit back of head. FINDINGS: CT HEAD BRAIN AND VENTRICLES: No acute intracranial hemorrhage. No mass effect or midline shift. No abnormal extra-axial fluid collection. Gray-white differentiation is maintained. No hydrocephalus. Global cortical atrophy. Subcortical and periventricular small vessel ischemic changes. Intracranial atherosclerosis. ORBITS: No acute abnormality. SINUSES AND MASTOIDS: No acute abnormality. SOFT TISSUES AND SKULL: No acute skull fracture. Mild soft tissue swelling overlying the right posterior vertex (sagittal image 133). CT CERVICAL SPINE BONES AND ALIGNMENT: No acute fracture or traumatic malalignment. DEGENERATIVE CHANGES: Moderate degenerative changes of the cervical spine with narrowing of the spinal canal, most prominent at C3-4. SOFT TISSUES: No prevertebral soft tissue swelling. IMPRESSION: 1. Mild soft tissue swelling overlying the right posterior vertex. No acute intracranial abnormality. Atrophy with small vessel ischemic changes. 2. No traumatic injury to the cervical spine. Moderate degenerative changes, most prominent at C3-4. Electronically signed by: Zadie Herter MD 05/24/2024 07:38 PM EDT RP Workstation: [id number]     Assessment and Plan: Syncope Patient had syncopal episode yesterday morning after a coughing fit.  He was using his walker to walk across the living room when he had his coughing fit that caused shortness of breath, then he synopsized.  Patient denies other symptoms leading up to this event.  He did strike his head and lose consciousness.  CT head shows no intracranial  hemorrhage.  He does have a posterior occipital hematoma.  He reports having a similar syncopal episode several years ago when he was bearing down on the toilet.  Patient also reported having profuse diarrhea the night before syncopal episode.  His syncope could be related to dehydration from diarrhea, could have been arrhthymias related, or more likely a vasovagal reaction during his coughing fit.  Creatinine is up 1.21. Flat troponins, no chest pain. Do no suspect ACS  Initial ECG was listed as atrial fibrillation, however after further review of his telemetry it appears he is in sinus rhythm with first degree AV block and PACs most likely causing the irregular rhythm noted on physical exams. - continue cardiac telemetry  Acute on chronic diastolic Heart Failure Echocardiogram in 2020 showed EF 60-65% with impaired relaxation. He has remained relatively symptom free besides peripheral edema that was been treated with prn lasix. This was increased to daily usage at last outpatient cardiology appointment. Patient felt he had good urine output and improvement in symptoms. A few weeks ago he started developing progressively worsening fatigue, shortness of breath on exertion, and peripheral edema. He is short of breath at rest on 3 L nasal cannula. Denies chest pain, orthopnea and PND.  He shares over the last several days he has had new abdominal distention which he though was related to constipation, so he stopped  his Lasix on day, Tuesday, and Wednesday. Echocardiogram obtained today showed unchanged EF of 60-65% with new moderately dilated RA and LA.  Pro-BNP : 2422 Na 137  K 3.9  Cr 1.17 On exam he is volume overloaded. Though he reports improvement in symptoms after two doses of IV lasix 20 mg.   -continue IV Lasix 20 mg BID  Hypertension PTA meds Metoprolol  50 mg BID, telmisartan 40 mg, and hydrochlorothiazide  12.5 mg Most recent BP: 102/61 -continue to hold home medications -continue  with diuresis listed above.   OSA He has not been using his CPAP, encouraged compliance  CAD s/p CABG in 2007 Hyperlipidemia -continue lovastatin 20 mg -continue ASA 81 mg  Per primary -Macrocytic anemia  Risk Assessment/Risk Scores:       [place]  [employer] (NYHA) Functional Class NYHA Class IV    For questions or updates, please contact [facility] Please consult [email or web address] for contact info under    Signed, Mabel Savage, PA-C  05/25/2024 5:40 PM   Patient seen and examined.  Agree with above documentation.  Scott [name] is a 89 year old male with a history of CAD status post CABG in 2007, hypertension, COPD who we are consulted by Dr. Augustus Ledger for evaluation of heart failure and possible atrial fibrillation.  He follows with Dr. [place], last seen 04/2024.  Negative stress Myoview  in 2010.  He presented to the ED on 05/24/2024 after syncopal episode.  States that he was having a coughing spell and then passed out.  Does report he has been having worsening shortness of breath and edema.  Initial vital signs notable for BP 125/67, pulse 67, SpO2 95% on room air.  Labs notable for creatinine 1.21, sodium 137, potassium 3.9, proBNP 2422, troponin 38 > 36, hemoglobin 12.7, platelets 154, WBC 9.6.  There is concern for possible A-fib, difficult to see P waves on EKG.  But review of telemetry shows he is in sinus with first-degree AV block and PACs, does appear intermittently in Mobitz 1.  Echocardiogram today shows EF 60 to 65%, normal RV function, moderate biatrial enlargement, mild mitral regurgitation, dilated ascending aorta measuring 44 mm. On exam, patient is alert and oriented, bradycardic, irregular rhythm, no murmurs, diminished breath sounds, 2+ lower extremity edema, + JVD.  For his syncopal episode, suspect vasovagal in setting of coughing.  Echocardiogram unremarkable.  Would continue to monitor on telemetry and can plan cardiac monitor on discharge if  telemetry is unrevealing.  Initial EKG was concerning for A-fib but on review of telemetry, appears he is in sinus with PACs, as well as intermittent Mobitz 1.  No clear A-fib seen.  For his acute on chronic diastolic heart failure, he is volume overloaded on exam.  Will continue diuresis with IV Lasix.  Wendie Hamburg, MD

## 2024-05-25 NOTE — ED Notes (Signed)
 Called [facility] for transport @12 :05pm.  Spoke with Taren

## 2024-05-25 NOTE — Plan of Care (Signed)

## 2024-05-25 NOTE — H&P (Addendum)
 History and Physical   Scott [name] QMV:[id number] DOB: Mar 25, 1928 DOA: 05/24/2024  PCP: [facility]   Patient coming from: Home  Chief Complaint: Fall, chest pain  HPI: Scott [name] is a 89 y.o. male with medical history significant of hypertension, hyperlipidemia, GERD, BPH, CAD, anemia, diastolic CHF, anxiety, OSA, obesity presenting after a fall at home.  Patient reportedly had a coughing fit which was followed by shortness of breath followed by syncopal event at home.  Patient did hit the back of his head and with the syncopal event did lose consciousness for a period of minutes.  Afterwards reports some pain in abdomen and back.  Family related that patient has had 3 weeks of worsening shortness of breath, dyspnea on exertion, lower extremity edema.  Patient is prescribed Lasix but takes this intermittently as he feels it gives him constipation and he holds it when he is constipated.  Has not had Lasix for the past 3 days.  Denies fevers, chills, chest pain, abdominal pain, nausea, vomiting.  ED Course: Vital signs in the ED notable for heart rate in the 50s-70s and new onset A-fib, respiratory rate in the teens-20s, requiring 3 L to maintain saturations.  Lab workup included BMP with chloride 95, glucose 166, calcium 8.8.  CBC with hemoglobin stable at 12.7.  PT and INR normal.  Troponin flat at 38, 36.  proBNP elevated to 2422.  Imaging studies included CT head which showed no acute abnormality, CT C-spine, CT T-spine which showed no acute abnormalities.  CT chest abdomen pelvis showed no acute normalities, did show diverticulosis, prostatomegaly, 4.1 cm aortic size.  Patient received Lasix 20 mg twice daily x 2 doses while waiting in the ED.  Also home medications were resumed including aspirin , statin, potassium, Breo, albuterol .  Cardiology consulted by EDP and will see the patient.  Review of Systems: As per HPI otherwise all other systems reviewed  and are negative.  Past Medical History:  Diagnosis Date   Acute bronchitis 07/11/2012   Acute pancreatitis 04/17/2019   Anemia    Acute blood loss anemia secondary to surgery, followed fr/ Hip replacement    Anxiety    pt. reports that he has occas. nervous episodes    BPH (benign prostatic hyperplasia)    CAD (coronary artery disease)    s/p CABG in 2007  CARDILOLOGIST IS DR. P. [place]   Cancer (HCC)    melanoma - removed L ear - 2010   Cranial neuropathy    resolved    Generalized OA    GERD (gastroesophageal reflux disease)    H/O echocardiogram    last echo, stress test 05/2012- pt. followed by [place]    Hypercholesterolemia    Hypertension    Obesity    Osteoarthritis    End-stage osteoarthritis, right hip   Sleep apnea    with use of CPAP   SOB (shortness of breath)    WITH EXERTION   Thrombocytopenia (HCC)    possibly due to Lovenox    Past Surgical History:  Procedure Laterality Date   ACHILLES TENDON SURGERY  10/06/2012   Procedure: ACHILLES TENDON REPAIR;  Surgeon: Forbes Ida., MD;  Location: [facility] OR;  Service: Orthopedics;  Laterality: Right;  REPAIR RIGHT RUPTURE ACHILLES TENDON PRIMARY OPEN/PERCUTANEOUS, EXCISION PARTIAL BONE TALUS/CANCANEUS, REPAIR PARTIAL EXCISION CALCANEUS   APPENDECTOMY     ARTERY BIOPSY     temporal - L side- wnl, double vision treated /w prednisone - 1991  CALCANEAL OSTEOTOMY  10/06/2012   Procedure: CALCANEAL OSTEOTOMY;  Surgeon: Forbes Ida., MD;  Location: [facility] OR;  Service: Orthopedics;  Laterality: Right;   CARDIAC CATHETERIZATION  12/15/2005   NORMAL. EF 60%, angioplasty 1995   carpel tunnel surgery-bilateral     both hands    CORONARY ARTERY BYPASS GRAFT  2007   LIMA GRAFT TO THE LAD, SAPHENOUS VEIN GRAFT TO THE FIRST OBTUSE MARIGNAL, SAPHENOUS VEIN GRAFT TO THE SECOND OBTUSE MARGINAL AND A SAPHENOUS VEIN GRAFT TO THE ACUTE MARGINAL AND POSTERIOR LATERAL BRANCH   HERNIA REPAIR     I & D EXTREMITY  12/11/2012   Procedure:  IRRIGATION AND DEBRIDEMENT EXTREMITY;  Surgeon: Timothy Ford, MD;  Location: [facility] OR;  Service: Orthopedics;  Laterality: Right;  Irrigation and Debridement achilles, wound closure, apply wound vac, place antibiotic beads   INGUINAL HERNIA REPAIR Left 05/10/2013   Procedure: HERNIA REPAIR INGUINAL ADULT;  Surgeon: Keitha Pata, MD;  Location: [facility] ORS;  Service: General;  Laterality: Left;   INSERTION OF MESH Left 05/10/2013   Procedure: INSERTION OF MESH;  Surgeon: Keitha Pata, MD;  Location: [facility] ORS;  Service: General;  Laterality: Left;   JOINT REPLACEMENT     knee  11/12   left arthroscopic   TOTAL HIP ARTHROPLASTY     right    Social History  reports that he has never smoked. He has never used smokeless tobacco. He reports that he does not drink alcohol and does not use drugs.  Allergies  Allergen Reactions   Acetaminophen      Other reaction(s): mouth sores   Amlodipine Swelling    Swelling of feet and legs   Bactrim  [Sulfamethoxazole -Trimethoprim ] Other (See Comments)    Constipation    Codeine Nausea Only   Fexofenadine     Other reaction(s): mouth sores   Ibuprofen      Other reaction(s): nervousness   Lovenox [Enoxaparin Sodium] Other (See Comments)    MAY HAVE CAUSED THROMBOCYTOPENIA   Naproxen     Other reaction(s): mouth sores   Prednisone Swelling    Patient has tolerated doses for colds. Thinks it caused swelling in the past   Shellfish Allergy Nausea And Vomiting and Other (See Comments)    N&V, shaking   Sonata  [Zaleplon ] Other (See Comments)    Hyper feelings    Family History  Problem Relation Age of Onset   Prostate cancer Brother    Heart disease Brother    Heart disease Brother   Reviewed on admission  Prior to Admission medications   Medication Sig Start Date End Date Taking? Authorizing Provider  albuterol  (VENTOLIN  HFA) 108 (90 Base) MCG/ACT inhaler Inhale 1-2 puffs into the lungs every 6 (six) hours as needed for wheezing or shortness of breath.  11/28/20   Feliz Hosteller, MD  aspirin  81 MG tablet Take 81 mg by mouth every evening.    [provider]  BREO ELLIPTA 100-25 MCG/INH AEPB Inhale 1 puff into the lungs daily. 12/23/20   [provider]  EPINEPHrine  0.3 mg/0.3 mL IJ SOAJ injection Inject 0.3 mg into the muscle as needed for anaphylaxis. 11/28/20   Feliz Hosteller, MD  fish oil-omega-3 fatty acids 1000 MG capsule Take 1 g by mouth every other day.    [provider]  furosemide (LASIX) 20 MG tablet TAKE 1 TABLET BY MOUTH ONCE EACH MORNING AS NEEDED FOR SWELLING/EDEMA 08/26/19   [provider]  hydrochlorothiazide  (HYDRODIURIL ) 12.5 MG tablet Take 12.5 mg  by mouth every morning. 12/13/19   [provider]  Influenza Vac High-Dose Quad (FLUZONE HIGH-DOSE QUADRIVALENT) 0.7 ML SUSY Fluzone High-Dose Quad 2020-21 (PF) 240 mcg/0.7 mL IM syringe  PHARMACY ADMINISTERED    [provider]  lovastatin (MEVACOR) 20 MG tablet Take 20 mg by mouth at bedtime.    [provider]  meclizine  (ANTIVERT ) 12.5 MG tablet Take 1 tablet (12.5 mg total) by mouth 3 (three) times daily as needed for dizziness. 01/04/23   Dalene Duck, MD  metoprolol  (LOPRESSOR ) 50 MG tablet Take 25 mg by mouth 2 (two) times daily.    [provider]  pantoprazole  (PROTONIX ) 20 MG tablet Take 1 tablet (20 mg total) by mouth daily. 04/05/23   Carie Charity, NP  polyethylene glycol (MIRALAX  / GLYCOLAX ) 17 g packet Take 17 g by mouth daily. 09/04/23   Auston Blush, MD  potassium chloride  (K-DUR,KLOR-CON ) 10 MEQ tablet Take 10 mEq by mouth every evening.     [provider]  potassium chloride  (KLOR-CON ) 10 MEQ tablet potassium chloride  ER 10 mEq tablet,extended release  TK 1 T PO QD    [provider]  tamsulosin  (FLOMAX ) 0.4 MG CAPS capsule Take 1 capsule (0.4 mg total) by mouth at bedtime. 09/28/23   Stoneking, Ponce Brisker., MD  telmisartan (MICARDIS) 40 MG tablet Take 40 mg by mouth  daily. 08/14/19   [provider]  traZODone  (DESYREL ) 50 MG tablet Take 1 tablet (50 mg total) by mouth at bedtime. 12/03/14   Rosa College D, MD  triazolam  (HALCION ) 0.25 MG tablet TAKE 1 TABLET (0.25 MG TOTAL) BY MOUTH AT BEDTIME AS NEEDED FOR SLEEP. 05/26/21   Rosa College D, MD  vitamin E  400 UNIT capsule Take 400 Units by mouth daily.     [provider]    Physical Exam: Vitals:   05/25/24 1047 05/25/24 1200 05/25/24 1215 05/25/24 1434  BP:  121/66  102/61  Pulse:  (!) 145 (!) 58 64  Resp:  15 15 20   Temp: 98.7 F (37.1 C)   98.5 F (36.9 C)  TempSrc: Oral   Oral  SpO2:  100% 100% 97%  Weight:      Height:        Physical Exam Constitutional:      General: He is not in acute distress.    Appearance: Normal appearance. He is obese.  HENT:     Head: Normocephalic and atraumatic.     Mouth/Throat:     Mouth: Mucous membranes are moist.     Pharynx: Oropharynx is clear.   Eyes:     Extraocular Movements: Extraocular movements intact.     Pupils: Pupils are equal, round, and reactive to light.    Cardiovascular:     Rate and Rhythm: Normal rate. Rhythm irregular.     Pulses: Normal pulses.     Heart sounds: Normal heart sounds.  Pulmonary:     Effort: Pulmonary effort is normal. No respiratory distress.     Breath sounds: Normal breath sounds.  Abdominal:     General: Bowel sounds are normal. There is no distension.     Palpations: Abdomen is soft.     Tenderness: There is no abdominal tenderness.   Musculoskeletal:        General: No swelling or deformity.     Right lower leg: Edema present.     Left lower leg: Edema present.   Skin:    General: Skin is warm and dry.  Neurological:     General: No focal deficit present.     Mental Status: Mental status is at baseline.    Labs on Admission: I have personally reviewed following labs and imaging studies  CBC: Recent Labs  Lab 05/24/24 1700  WBC 9.6  HGB 12.7*  HCT 40.1  MCV  100.3*  PLT 154    Basic Metabolic Panel: Recent Labs  Lab 05/24/24 1700  NA 137  K 3.9  CL 95*  CO2 31  GLUCOSE 166*  BUN 16  CREATININE 1.21  CALCIUM 8.8*    GFR: Estimated Creatinine Clearance: 39.2 mL/min (by C-G formula based on SCr of 1.21 mg/dL).  Liver Function Tests: No results for input(s): AST, ALT, ALKPHOS, BILITOT, PROT, ALBUMIN in the last 168 hours.  Urine analysis:    Component Value Date/Time   COLORURINE YELLOW 05/24/2024 1930   APPEARANCEUR CLEAR 05/24/2024 1930   APPEARANCEUR Clear 09/28/2023 0912   LABSPEC 1.015 05/24/2024 1930   PHURINE 7.5 05/24/2024 1930   GLUCOSEU NEGATIVE 05/24/2024 1930   HGBUR NEGATIVE 05/24/2024 1930   BILIRUBINUR NEGATIVE 05/24/2024 1930   BILIRUBINUR Negative 09/28/2023 0912   KETONESUR NEGATIVE 05/24/2024 1930   PROTEINUR 100 (A) 05/24/2024 1930   NITRITE NEGATIVE 05/24/2024 1930   LEUKOCYTESUR NEGATIVE 05/24/2024 1930    Radiological Exams on Admission: CT T-SPINE NO CHARGE Result Date: 05/24/2024 CLINICAL DATA:  Back pain after fall EXAM: CT Thoracic Spine without contrast TECHNIQUE: Multiplanar CT images of the thoracic spine were reconstructed from contemporary CT of the Chest. RADIATION DOSE REDUCTION: This exam was performed according to the departmental dose-optimization program which includes automated exposure control, adjustment of the mA and/or kV according to patient size and/or use of iterative reconstruction technique. CONTRAST:  None or No additional COMPARISON:  None Available. FINDINGS: Alignment: Mild S shaped scoliosis of the spine. Sagittal alignment is within normal limits. Vertebrae: Vertebral body heights are maintained. No definitive fracture is seen. Paraspinal and other soft tissues: No definite acute paravertebral or paraspinal soft tissue abnormality. Disc levels: Advanced multilevel degenerative changes with multilevel degenerative osteophytes and multilevel disc space narrowing.  Increased disc height at T5-T6 compared with several levels above and below. IMPRESSION: 1. Advanced multilevel degenerative changes. Vertebral body heights are maintained and no definitive fracture lucency is seen. 2. Increased disc height at T5-T6 compared with several adjacent levels above and below, with overall rigid appearance of spine due to advanced degenerative changes and multilevel osteophytes. Correlate for focal symptoms to the region, MRI could be obtained if presentation suggests potential ligamentous injury. Electronically Signed   By: Esmeralda Hedge M.D.   On: 05/24/2024 20:21   CT CHEST ABDOMEN PELVIS W CONTRAST Result Date: 05/24/2024 CLINICAL DATA:  Marvell Slider at home syncope EXAM: CT CHEST, ABDOMEN, AND PELVIS WITH CONTRAST TECHNIQUE: Multidetector CT imaging of the chest, abdomen and pelvis was performed following the standard protocol during bolus administration of intravenous contrast. RADIATION DOSE REDUCTION: This exam was performed according to the departmental dose-optimization program which includes automated exposure control, adjustment of the mA and/or kV according to patient size and/or use of iterative reconstruction technique. CONTRAST:  OMNIPAQUE  IOHEXOL  300 MG/ML  SOLN COMPARISON:  CT 04/16/2019 FINDINGS: CT CHEST FINDINGS Cardiovascular: Moderate aortic atherosclerosis. Post CABG changes. Ascending aortic diameter up to 4.1 cm. Coronary vascular calcification. Mild cardiomegaly. No pericardial effusion Mediastinum/Nodes: Patent trachea. No thyroid  mass. No suspicious lymph nodes. Esophagus within normal limits. Lungs/Pleura: No acute airspace disease, pleural effusion, or pneumothorax. Bleb at the  medial right base. Musculoskeletal: Sternotomy.  See separately dictated spine CT CT ABDOMEN PELVIS FINDINGS Hepatobiliary: No focal liver abnormality is seen. No gallstones, gallbladder wall thickening, or biliary dilatation. Pancreas: Unremarkable. No pancreatic ductal dilatation or  surrounding inflammatory changes. Spleen: Inhomogeneous enhancement probably due to phase of enhancement. Adrenals/Urinary Tract: Adrenal glands are normal. The kidneys show no hydronephrosis. Renal cysts for which no imaging follow-up is recommended. The bladder is normal Stomach/Bowel: Stomach nonenlarged. No dilated small bowel. No acute bowel wall thickening. Mild diverticular disease of the colon Vascular/Lymphatic: Aortic atherosclerosis. No enlarged abdominal or pelvic lymph nodes. Reproductive: Markedly enlarged prostate Other: Negative for pelvic effusion or free air. Small fat containing right inguinal hernia. Small fat containing umbilical hernia Musculoskeletal: Right hip replacement. Multilevel degenerative changes. No acute osseous abnormality. IMPRESSION: 1. Negative for acute intrathoracic, abdominal, or pelvic abnormality. 2. Mild diverticular disease of the colon without acute inflammatory process. 3. Markedly enlarged prostate. 4. Aortic atherosclerosis. Ascending aortic diameter up to 4.1 cm. Recommend annual imaging followup by CTA or MRA. This recommendation follows 2010 ACCF/AHA/AATS/ACR/ASA/SCA/SCAI/SIR/STS/SVM Guidelines for the Diagnosis and Management of Patients with Thoracic Aortic Disease. Circulation. 2010; 121: Z610-R604. Aortic aneurysm NOS (ICD10-I71.9) Aortic Atherosclerosis (ICD10-I70.0). Electronically Signed   By: Esmeralda Hedge M.D.   On: 05/24/2024 20:21   CT Head Wo Contrast Result Date: 05/24/2024 EXAM: CT HEAD AND CERVICAL SPINE 05/24/2024 07:11:30 PM TECHNIQUE: CT of the head and cervical spine was performed without the administration of intravenous contrast. Multiplanar reformatted images are provided for review. Automated exposure control, iterative reconstruction, and/or weight based adjustment of the mA/kV was utilized to reduce the radiation dose to as low as reasonably achievable. COMPARISON: None available. CLINICAL HISTORY: 89 y/o male. Patient suffered fall  after syncopal episode today. Hit back of head. FINDINGS: CT HEAD BRAIN AND VENTRICLES: No acute intracranial hemorrhage. No mass effect or midline shift. No abnormal extra-axial fluid collection. Gray-white differentiation is maintained. No hydrocephalus. Global cortical atrophy. Subcortical and periventricular small vessel ischemic changes. Intracranial atherosclerosis. ORBITS: No acute abnormality. SINUSES AND MASTOIDS: No acute abnormality. SOFT TISSUES AND SKULL: No acute skull fracture. Mild soft tissue swelling overlying the right posterior vertex (sagittal image 133). CT CERVICAL SPINE BONES AND ALIGNMENT: No acute fracture or traumatic malalignment. DEGENERATIVE CHANGES: Moderate degenerative changes of the cervical spine with narrowing of the spinal canal, most prominent at C3-4. SOFT TISSUES: No prevertebral soft tissue swelling. IMPRESSION: 1. Mild soft tissue swelling overlying the right posterior vertex. No acute intracranial abnormality. Atrophy with small vessel ischemic changes. 2. No traumatic injury to the cervical spine. Moderate degenerative changes, most prominent at C3-4. Electronically signed by: Zadie Herter MD 05/24/2024 07:38 PM EDT RP Workstation: [id number]   CT Cervical Spine Wo Contrast Result Date: 05/24/2024 EXAM: CT HEAD AND CERVICAL SPINE 05/24/2024 07:11:30 PM TECHNIQUE: CT of the head and cervical spine was performed without the administration of intravenous contrast. Multiplanar reformatted images are provided for review. Automated exposure control, iterative reconstruction, and/or weight based adjustment of the mA/kV was utilized to reduce the radiation dose to as low as reasonably achievable. COMPARISON: None available. CLINICAL HISTORY: 89 y/o male. Patient suffered fall after syncopal episode today. Hit back of head. FINDINGS: CT HEAD BRAIN AND VENTRICLES: No acute intracranial hemorrhage. No mass effect or midline shift. No abnormal extra-axial fluid collection.  Gray-white differentiation is maintained. No hydrocephalus. Global cortical atrophy. Subcortical and periventricular small vessel ischemic changes. Intracranial atherosclerosis. ORBITS: No acute abnormality. SINUSES AND MASTOIDS: No acute abnormality. SOFT  TISSUES AND SKULL: No acute skull fracture. Mild soft tissue swelling overlying the right posterior vertex (sagittal image 133). CT CERVICAL SPINE BONES AND ALIGNMENT: No acute fracture or traumatic malalignment. DEGENERATIVE CHANGES: Moderate degenerative changes of the cervical spine with narrowing of the spinal canal, most prominent at C3-4. SOFT TISSUES: No prevertebral soft tissue swelling. IMPRESSION: 1. Mild soft tissue swelling overlying the right posterior vertex. No acute intracranial abnormality. Atrophy with small vessel ischemic changes. 2. No traumatic injury to the cervical spine. Moderate degenerative changes, most prominent at C3-4. Electronically signed by: Zadie Herter MD 05/24/2024 07:38 PM EDT RP Workstation: [id number]   EKG: Independently reviewed. Atrial fibrillation with SVR versus second-degree AV block.  Difficult to tell with low voltage, artifact, wander.  Repeat EKG showed junctional rhythm.  Assessment/Plan Principal Problem:   Acute on chronic diastolic CHF (congestive heart failure) (HCC) Active Problems:   Obstructive sleep apnea   Pure hypercholesterolemia   Obesity   Hypertension   GERD (gastroesophageal reflux disease)   CAD (coronary artery disease)   BPH (benign prostatic hyperplasia)   Anxiety   Anemia   Chronic diastolic CHF (congestive heart failure) (HCC)   Abnormal ECG   Acute on chronic diastolic CHF > Patient presenting with worsening shortness of breath, edema, elevated BNP in the setting of prior diastolic dysfunction. > Is prescribed Lasix but takes this primarily for swelling that was not thought to be due to CHF.  Missed the last 3 doses. > Last echo was in 2020 with EF 60-65%, G2 DD,  normal RV function. > Received 2 doses of 20 mg IV Lasix twice daily so far. - Monitor on telemetry - Appreciate cardiology recommendations and assistance - Continue with Lasix - Echocardiogram - Strict I's and O's, daily weights - Given slow heart rate, will hold metoprolol   New A-fib versus secondary AV block > Initial EKG concerning for A-fib with SVR for secondary AV block.  Will defer determination to cardiology team. - Monitoring on telemetry - Appreciate cardiology recommendations - Echocardiogram as above - Hold off on anticoagulation pending cardiology input  Hypertension - Lasix as above - Holding hydrochlorothiazide , metoprolol , telmisartan  Hyperlipidemia - Replace home lovastatin with formulary pravastatin   GERD - Continue home PPI  BPH - Continue home tamsulosin   CAD - Continue home ASA, statin  Anemia > Hemoglobin stable at 12.7 - Trend CBC  Anxiety - Continue home trazodone   OSA - Continue home CPAP  Obesity - Noted  DVT prophylaxis: SCDs, possible full anticoagulation if A-fib, previous adverse reaction to Lovenox/heparin products.  Code Status:   Full Family Communication:  None on admission Disposition Plan:   Patient is from:  Home  Anticipated DC to:  Home  Anticipated DC date:  2 to 3 days  Anticipated DC barriers: None  Consults called:  Cardiology Admission status:  Inpatient, telemetry  Severity of Illness: The appropriate patient status for this patient is INPATIENT. Inpatient status is judged to be reasonable and necessary in order to provide the required intensity of service to ensure the patient's safety. The patient's presenting symptoms, physical exam findings, and initial radiographic and laboratory data in the context of their chronic comorbidities is felt to place them at high risk for further clinical deterioration. Furthermore, it is not anticipated that the patient will be medically stable for discharge from the hospital  within 2 midnights of admission.   * I certify that at the point of admission it is my clinical judgment that the patient will  require inpatient hospital care spanning beyond 2 midnights from the point of admission due to high intensity of service, high risk for further deterioration and high frequency of surveillance required.Johnetta Nab MD [facility]  How to contact the [facility] Attending or Consulting provider 7A - 7P or covering provider during after hours 7P -7A, for this patient?   Check the care team in [facility] and look for a) attending/consulting [facility] provider listed and b) the [facility] team listed Log into [email or web address] and use [facility] universal password to access. If you do not have the password, please contact the hospital operator. Locate the [facility] provider you are looking for under [facility] and page to a number that you can be directly reached. If you still have difficulty reaching the provider, please page the [facility] (Director on Call) for the Hospitalists listed on amion for assistance.  05/25/2024, 3:49 PM

## 2024-05-25 NOTE — ED Provider Notes (Signed)
 Emergency Medicine Observation Re-evaluation Note  Scott [name] is a 89 y.o. male, seen on rounds today.  Pt initially presented to the ED for complaints of Fall and Chest Pain Currently, the patient is awake, no new complaints this morning.  Physical Exam  BP 128/70   Pulse (!) 59   Temp 98 F (36.7 C) (Oral)   Resp (!) 23   Ht 5' 5 (1.651 m)   Wt 89.4 kg   SpO2 100%   BMI 32.78 kg/m  Physical Exam General: Awake and alert, no acute distress Cardiac: Regular rate and rhythm, no visible JVD Lungs: CTAB, on 3L [place] Psych: Calm, cooperative Extremities: 2+ pitting edema to bilateral LE to knees  ED Course / MDM  EKG:   I have reviewed the labs performed to date as well as medications administered while in observation.  Recent changes in the last 24 hours include patient admitted for CHF exacerbation and syncope. Was diuresed with lasix last night. HR 40s-60s and BP 120s this morning so will hold remainder of home BP meds for now, will continue to diurese.   Plan  Current plan is for admission, continue diuresis this morning.    Kingsley, Haille Pardi K, DO 05/25/24 [phone]

## 2024-05-25 NOTE — Progress Notes (Signed)
 Echocardiogram 2D Echocardiogram has been performed.  Scott [name] 05/25/2024, 4:41 PM

## 2024-05-26 DIAGNOSIS — I5033 Acute on chronic diastolic (congestive) heart failure: Secondary | ICD-10-CM

## 2024-05-26 DIAGNOSIS — R55 Syncope and collapse: Secondary | ICD-10-CM | POA: Diagnosis not present

## 2024-05-26 LAB — COMPREHENSIVE METABOLIC PANEL WITH GFR
ALT: 12 U/L (ref 0–44)
AST: 14 U/L — ABNORMAL LOW (ref 15–41)
Albumin: 3 g/dL — ABNORMAL LOW (ref 3.5–5.0)
Alkaline Phosphatase: 56 U/L (ref 38–126)
Anion gap: 10 (ref 5–15)
BUN: 16 mg/dL (ref 8–23)
CO2: 32 mmol/L (ref 22–32)
Calcium: 8.5 mg/dL — ABNORMAL LOW (ref 8.9–10.3)
Chloride: 93 mmol/L — ABNORMAL LOW (ref 98–111)
Creatinine, Ser: 1.38 mg/dL — ABNORMAL HIGH (ref 0.61–1.24)
GFR, Estimated: 48 mL/min — ABNORMAL LOW (ref >60)
Glucose, Bld: 115 mg/dL — ABNORMAL HIGH (ref 70–99)
Potassium: 4 mmol/L (ref 3.5–5.1)
Sodium: 135 mmol/L (ref 135–145)
Total Bilirubin: 1.9 mg/dL — ABNORMAL HIGH (ref 0.0–1.2)
Total Protein: 5.4 g/dL — ABNORMAL LOW (ref 6.5–8.1)

## 2024-05-26 LAB — CBC
HCT: 37.4 % — ABNORMAL LOW (ref 39.0–52.0)
Hemoglobin: 11.7 g/dL — ABNORMAL LOW (ref 13.0–17.0)
MCH: 31 pg (ref 26.0–34.0)
MCHC: 31.3 g/dL (ref 30.0–36.0)
MCV: 99.2 fL (ref 80.0–100.0)
Platelets: 132 10*3/uL — ABNORMAL LOW (ref 150–400)
RBC: 3.77 MIL/uL — ABNORMAL LOW (ref 4.22–5.81)
RDW: 13.4 % (ref 11.5–15.5)
WBC: 7.9 10*3/uL (ref 4.0–10.5)
nRBC: 0 % (ref 0.0–0.2)

## 2024-05-26 LAB — MAGNESIUM: Magnesium: 2 mg/dL (ref 1.7–2.4)

## 2024-05-26 MED ORDER — POLYETHYLENE GLYCOL 3350 17 G PO PACK
17.0000 g | PACK | Freq: Two times a day (BID) | ORAL | Status: DC
  Administered 2024-05-26 – 2024-05-27 (×3): 17 g via ORAL
  Filled 2024-05-26 (×3): qty 1

## 2024-05-26 MED ORDER — FUROSEMIDE 20 MG PO TABS: 20.0000 mg | ORAL_TABLET | Freq: Every day | ORAL | Status: DC

## 2024-05-26 NOTE — Plan of Care (Signed)

## 2024-05-26 NOTE — Progress Notes (Signed)
 DAILY PROGRESS NOTE   Patient Name: Scott [name] Date of Encounter: 05/26/2024 Cardiologist: Peter Swaziland, MD  Chief Complaint   Breathing better  Patient Profile   Scott [name] is a 89 y.o. male with a hx of CAD s/p CABG in 2007 (LIMA to LAD, SVG to OM 2, SVG to Acute marginal and posterior lateral branch), Hypertension, Hyperlipidemia, GERD, COPD, and OSA who is being seen 05/25/2024 for the evaluation of CHF and AF at the request of Almarie Ku MD.   Subjective   Net negative about 1.6L overnight, overall 2.7L negative. Creatinine up to 1.38 today. NT-pro BNP was 2422 (given age, >67 is abnormal).   Objective   Vitals:   05/25/24 1956 05/26/24 0042 05/26/24 0527 05/26/24 0827  BP: (!) 121/54 (!) 107/54 (!) 108/53 (!) 96/52  Pulse: 74 73 76 84  Resp: 19 19 19 18   Temp: 98.9 F (37.2 C) 99.1 F (37.3 C) 99.4 F (37.4 C) 98.8 F (37.1 C)  TempSrc: Oral Oral Oral Oral  SpO2: 98% 94% 97% 98%  Weight:   86.1 kg   Height:        Intake/Output Summary (Last 24 hours) at 05/26/2024 0855 Last data filed at 05/26/2024 0049 Gross per 24 hour  Intake 267 ml  Output 1950 ml  Net -1683 ml   Filed Weights   05/24/24 1654 05/26/24 0527  Weight: 89.4 kg 86.1 kg    Physical Exam   General appearance: alert, appears stated age, and no distress Neck: JVD - 2 cm above sternal notch and no carotid bruit Lungs: clear to auscultation bilaterally Heart: regular rate and rhythm Extremities: edema trace sockline Neurologic: Grossly normal  Inpatient Medications    Scheduled Meds:  aspirin   81 mg Oral QPM   fluticasone  furoate-vilanterol  1 puff Inhalation Daily   furosemide   20 mg Intravenous BID   pantoprazole   20 mg Oral Daily   potassium chloride   10 mEq Oral BID   pravastatin   20 mg Oral q1800   sodium chloride  flush  3 mL Intravenous Q12H   tamsulosin   0.4 mg Oral QHS   traZODone   50 mg Oral QHS    Continuous Infusions:   PRN Meds: albuterol ,  polyethylene glycol   Labs   Results for orders placed or performed during the hospital encounter of 05/24/24 (from the past 48 hours)  Basic metabolic panel     Status: Abnormal   Collection Time: 05/24/24  5:00 PM  Result Value Ref Range   Sodium 137 135 - 145 mmol/L   Potassium 3.9 3.5 - 5.1 mmol/L   Chloride 95 (L) 98 - 111 mmol/L   CO2 31 22 - 32 mmol/L   Glucose, Bld 166 (H) 70 - 99 mg/dL    Comment: Glucose reference range applies only to samples taken after fasting for at least 8 hours.   BUN 16 8 - 23 mg/dL   Creatinine, Ser 8.78 0.61 - 1.24 mg/dL   Calcium 8.8 (L) 8.9 - 10.3 mg/dL   GFR, Estimated 56 (L) >60 mL/min    Comment: (NOTE) Calculated using the CKD-EPI Creatinine Equation (2021)    Anion gap 12 5 - 15    Comment: Performed at [facility], [address] [place], [place] [address]  CBC     Status: Abnormal   Collection Time: 05/24/24  5:00 PM  Result Value Ref Range   WBC 9.6 4.0 - 10.5 K/uL   RBC 4.00 (L) 4.22 -  5.81 MIL/uL   Hemoglobin 12.7 (L) 13.0 - 17.0 g/dL   HCT 59.8 60.9 - 47.9 %   MCV 100.3 (H) 80.0 - 100.0 fL   MCH 31.8 26.0 - 34.0 pg   MCHC 31.7 30.0 - 36.0 g/dL   RDW 86.4 88.4 - 84.4 %   Platelets 154 150 - 400 K/uL   nRBC 0.0 0.0 - 0.2 %    Comment: Performed at [facility], [address] [place], [place] [address]  Troponin T, High Sensitivity     Status: Abnormal   Collection Time: 05/24/24  5:00 PM  Result Value Ref Range   Troponin T High Sensitivity 38 (H) <19 ng/L    Comment: (NOTE) Biotin concentrations > 1000 ng/mL falsely decrease TnT results.  Serial cardiac troponin measurements are suggested.  Refer to the Links section for chest pain algorithms and additional  guidance. Performed at [facility], [address] [place], [place] [address]   Pro Brain natriuretic peptide     Status: Abnormal   Collection Time: 05/24/24  5:02 PM  Result Value Ref Range   Pro Brain Natriuretic Peptide  2,422.0 (H) <300.0 pg/mL    Comment: (NOTE) Age Group        Cut-Points    Interpretation  < 50 years     450 pg/mL       NT-proBNP > 450 pg/mL indicates                                ADHF is likely              50 to 75 years  900 pg/mL      NT-proBNP > 900 pg/mL indicates          ADHF is likely  > 75 years      1800 pg/mL     NT-proBNP > 1800 pg/mL indicates          ADHF is likely                           All ages    Results between       Indeterminate. Further clinical             300 and the cut-   information is needed to determine            point for age group   if ADHF is present.                                                             Elecsys proBNP II/ Elecsys proBNP II STAT           Cut-Point                       Interpretation  300 pg/mL                    NT-proBNP <300pg/mL indicates                             ADHF is not likely  Performed at  [facility], [address] [place], [place] [address]   Protime-INR     Status: None   Collection Time: 05/24/24  6:01 PM  Result Value Ref Range   Prothrombin Time 13.6 11.4 - 15.2 seconds   INR 1.0 0.8 - 1.2    Comment: (NOTE) INR goal varies based on device and disease states. Performed at [facility], [address] [place], [place] [address]   Urinalysis, Routine w reflex microscopic -Urine, Clean Catch     Status: Abnormal   Collection Time: 05/24/24  7:30 PM  Result Value Ref Range   Color, Urine YELLOW YELLOW   APPearance CLEAR CLEAR   Specific Gravity, Urine 1.015 1.005 - 1.030   pH 7.5 5.0 - 8.0   Glucose, UA NEGATIVE NEGATIVE mg/dL   Hgb urine dipstick NEGATIVE NEGATIVE   Bilirubin Urine NEGATIVE NEGATIVE   Ketones, ur NEGATIVE NEGATIVE mg/dL   Protein, ur 899 (A) NEGATIVE mg/dL   Nitrite NEGATIVE NEGATIVE   Leukocytes,Ua NEGATIVE NEGATIVE    Comment: Performed at [facility], [address] [place], [place] [address]  Troponin T, High Sensitivity      Status: Abnormal   Collection Time: 05/24/24  7:30 PM  Result Value Ref Range   Troponin T High Sensitivity 36 (H) <19 ng/L    Comment: (NOTE) Biotin concentrations > 1000 ng/mL falsely decrease TnT results.  Serial cardiac troponin measurements are suggested.  Refer to the Links section for chest pain algorithms and additional  guidance. Performed at [facility], [address] [place], [place] [address]   Urinalysis, Microscopic (reflex)     Status: Abnormal   Collection Time: 05/24/24  7:30 PM  Result Value Ref Range   RBC / HPF 0-5 0 - 5 RBC/hpf   WBC, UA NONE SEEN 0 - 5 WBC/hpf   Bacteria, UA RARE (A) NONE SEEN   Squamous Epithelial / HPF 0-5 0 - 5 /HPF    Comment: Performed at [facility], [address] [place], [place] [address]  Comprehensive metabolic panel     Status: Abnormal   Collection Time: 05/25/24  4:01 PM  Result Value Ref Range   Sodium 137 135 - 145 mmol/L   Potassium 3.9 3.5 - 5.1 mmol/L   Chloride 91 (L) 98 - 111 mmol/L   CO2 37 (H) 22 - 32 mmol/L   Glucose, Bld 114 (H) 70 - 99 mg/dL    Comment: Glucose reference range applies only to samples taken after fasting for at least 8 hours.   BUN 13 8 - 23 mg/dL   Creatinine, Ser 8.82 0.61 - 1.24 mg/dL   Calcium 9.1 8.9 - 89.6 mg/dL   Total Protein 6.4 (L) 6.5 - 8.1 g/dL   Albumin 3.5 3.5 - 5.0 g/dL   AST 20 15 - 41 U/L   ALT 13 0 - 44 U/L   Alkaline Phosphatase 69 38 - 126 U/L   Total Bilirubin 1.6 (H) 0.0 - 1.2 mg/dL   GFR, Estimated 58 (L) >60 mL/min    Comment: (NOTE) Calculated using the CKD-EPI Creatinine Equation (2021)    Anion gap 9 5 - 15    Comment: Performed at [facility], [address] [place], [place] [address]  CBC     Status: Abnormal   Collection Time: 05/25/24  4:01 PM  Result Value Ref Range   WBC 7.4 4.0 - 10.5  K/uL   RBC 4.32 4.22 - 5.81 MIL/uL   Hemoglobin 13.3 13.0 - 17.0 g/dL   HCT 57.1 60.9 - 47.9 %   MCV 99.1 80.0 - 100.0 fL   MCH 30.8  26.0 - 34.0 pg   MCHC 31.1 30.0 - 36.0 g/dL   RDW 86.5 88.4 - 84.4 %   Platelets 142 (L) 150 - 400 K/uL    Comment: REPEATED TO VERIFY   nRBC 0.0 0.0 - 0.2 %    Comment: Performed at [facility], [address] [place], [place] [address]  Magnesium     Status: None   Collection Time: 05/25/24  4:01 PM  Result Value Ref Range   Magnesium 2.1 1.7 - 2.4 mg/dL    Comment: Performed at [facility], [address] [place], [place] [address]  TSH     Status: None   Collection Time: 05/25/24  4:01 PM  Result Value Ref Range   TSH 2.185 0.350 - 4.500 uIU/mL    Comment: Performed by a 3rd Generation assay with a functional sensitivity of <=0.01 uIU/mL. Performed at [facility], [address], [place], [place] [address]   Comprehensive metabolic panel     Status: Abnormal   Collection Time: 05/26/24  3:04 AM  Result Value Ref Range   Sodium 135 135 - 145 mmol/L   Potassium 4.0 3.5 - 5.1 mmol/L   Chloride 93 (L) 98 - 111 mmol/L   CO2 32 22 - 32 mmol/L   Glucose, Bld 115 (H) 70 - 99 mg/dL    Comment: Glucose reference range applies only to samples taken after fasting for at least 8 hours.   BUN 16 8 - 23 mg/dL   Creatinine, Ser 8.61 (H) 0.61 - 1.24 mg/dL   Calcium 8.5 (L) 8.9 - 10.3 mg/dL   Total Protein 5.4 (L) 6.5 - 8.1 g/dL   Albumin 3.0 (L) 3.5 - 5.0 g/dL   AST 14 (L) 15 - 41 U/L   ALT 12 0 - 44 U/L   Alkaline Phosphatase 56 38 - 126 U/L   Total Bilirubin 1.9 (H) 0.0 - 1.2 mg/dL   GFR, Estimated 48 (L) >60 mL/min    Comment: (NOTE) Calculated using the CKD-EPI Creatinine Equation (2021)    Anion gap 10 5 - 15    Comment: Performed at [facility], [address], [place], [place] [address]  CBC     Status: Abnormal   Collection Time: 05/26/24  3:04 AM  Result Value Ref Range   WBC 7.9 4.0 - 10.5 K/uL   RBC 3.77 (L) 4.22 - 5.81 MIL/uL   Hemoglobin 11.7 (L) 13.0 - 17.0 g/dL   HCT 62.5 (L) 60.9 - 47.9 %   MCV 99.2 80.0 - 100.0 fL   MCH 31.0 26.0 -  34.0 pg   MCHC 31.3 30.0 - 36.0 g/dL   RDW 86.5 88.4 - 84.4 %   Platelets 132 (L) 150 - 400 K/uL    Comment: REPEATED TO VERIFY   nRBC 0.0 0.0 - 0.2 %    Comment: Performed at [facility], [address], [place], [place] [address]  Magnesium     Status: None   Collection Time: 05/26/24  3:04 AM  Result Value Ref Range   Magnesium 2.0 1.7 - 2.4 mg/dL    Comment: Performed at [facility], [address], [place], [place] [address]    ECG  N/A  Telemetry   Sinus rhythm with PAC's - Personally Reviewed  Radiology    ECHOCARDIOGRAM COMPLETE Result Date: 05/25/2024    ECHOCARDIOGRAM REPORT   Patient Name:   Scott [name] Date of Exam: 05/25/2024 Medical Rec #:  [id number]       Height:       65.0 in Accession #:    [id number]      Weight:       197.0 lb Date of Birth:  [birth year]       BSA:          1.965 m Patient Age:    89 years        BP:           102/61 mmHg Patient Gender: M               HR:           68 bpm. Exam Location:  Inpatient Procedure: 2D Echo, Cardiac Doppler and Color Doppler (Both Spectral and Color            Flow Doppler were utilized during procedure). Indications:    Atrial Fibrillation I48.91, CHF I50.31  History:        Patient has prior history of Echocardiogram examinations, most                 recent 09/10/2019. CHF, CAD, Signs/Symptoms:Dyspnea and Shortness                 of Breath; Risk Factors:Hypertension and Sleep Apnea.  Sonographer:    Thea Norlander RCS Referring Phys: [id number] LOGAN N LOCKWOOD IMPRESSIONS  1. Left ventricular ejection fraction, by estimation, is 60 to 65%. The left ventricle has normal function. The left ventricle has no regional wall motion abnormalities. Left ventricular diastolic parameters are indeterminate.  2. Right ventricular systolic function is normal. The right ventricular size is normal.  3. Left atrial size was moderately dilated.  4. Right atrial size was moderately dilated.  5. The mitral valve is abnormal.  Mild mitral valve regurgitation. No evidence of mitral stenosis.  6. The aortic valve is tricuspid. There is moderate calcification of the aortic valve. Aortic valve regurgitation is trivial. Aortic valve sclerosis is present, with no evidence of aortic valve stenosis.  7. There is moderate dilatation of the ascending aorta, measuring 44 mm.  8. The inferior vena cava is normal in size with greater than 50% respiratory variability, suggesting right atrial pressure of 3 mmHg. FINDINGS  Left Ventricle: Left ventricular ejection fraction, by estimation, is 60 to 65%. The left ventricle has normal function. The left ventricle has no regional wall motion abnormalities. Strain was performed and the global longitudinal strain is indeterminate. The left ventricular internal cavity size was normal in size. There is no left ventricular hypertrophy. Left ventricular diastolic parameters are indeterminate. Right Ventricle: The right ventricular size is normal. No increase in right ventricular wall thickness. Right ventricular systolic function is normal. Left Atrium: Left atrial size was moderately dilated. Right Atrium: Right atrial size was moderately dilated. Pericardium: There is no evidence of pericardial effusion. Mitral Valve: The mitral valve is abnormal. There is mild thickening of the mitral valve leaflet(s). There is mild calcification of the mitral valve leaflet(s). Mild mitral valve regurgitation. No evidence of mitral valve stenosis. Tricuspid Valve: The tricuspid valve is normal in structure. Tricuspid valve regurgitation is mild . No evidence of tricuspid stenosis. Aortic Valve: The aortic valve is tricuspid. There is moderate calcification of  the aortic valve. Aortic valve regurgitation is trivial. Aortic valve sclerosis is present, with no evidence of aortic valve stenosis. Aortic valve peak gradient measures 7.1  mmHg. Pulmonic Valve: The pulmonic valve was normal in structure. Pulmonic valve regurgitation is  not visualized. No evidence of pulmonic stenosis. Aorta: The aortic root is normal in size and structure. There is moderate dilatation of the ascending aorta, measuring 44 mm. Venous: The inferior vena cava is normal in size with greater than 50% respiratory variability, suggesting right atrial pressure of 3 mmHg. IAS/Shunts: No atrial level shunt detected by color flow Doppler. Additional Comments: 3D was performed not requiring image post processing on an independent workstation and was indeterminate.  LEFT VENTRICLE PLAX 2D LVIDd:         4.60 cm   Diastology LVIDs:         2.80 cm   LV e' medial:    7.93 cm/s LV PW:         1.10 cm   LV E/e' medial:  18.9 LV IVS:        1.00 cm   LV e' lateral:   11.20 cm/s LVOT diam:     2.10 cm   LV E/e' lateral: 13.4 LV SV:         48 LV SV Index:   25 LVOT Area:     3.46 cm  RIGHT VENTRICLE            IVC RV S prime:     8.70 cm/s  IVC diam: 2.20 cm TAPSE (M-mode): 1.6 cm LEFT ATRIUM             Index        RIGHT ATRIUM           Index LA diam:        5.20 cm 2.65 cm/m   RA Area:     14.90 cm LA Vol (A2C):   57.2 ml 29.10 ml/m  RA Volume:   38.90 ml  19.79 ml/m LA Vol (A4C):   56.5 ml 28.75 ml/m LA Biplane Vol: 60.4 ml 30.73 ml/m  AORTIC VALVE AV Area (Vmax): 2.24 cm AV Vmax:        133.00 cm/s AV Peak Grad:   7.1 mmHg LVOT Vmax:      86.17 cm/s LVOT Vmean:     54.667 cm/s LVOT VTI:       0.140 m  AORTA Ao Root diam: 3.40 cm Ao Asc diam:  4.40 cm MITRAL VALVE                TRICUSPID VALVE MV Area (PHT): 4.29 cm     TR Peak grad:   38.7 mmHg MV Decel Time: 177 msec     TR Vmax:        311.00 cm/s MV E velocity: 150.00 cm/s                             SHUNTS                             Systemic VTI:  0.14 m                             Systemic Diam: 2.10 cm Maude Emmer MD Electronically signed by Maude Emmer MD Signature Date/Time: 05/25/2024/4:47:35 PM    Final  CT T-SPINE NO CHARGE Result Date: 05/24/2024 CLINICAL DATA:  Back pain after fall EXAM: CT  Thoracic Spine without contrast TECHNIQUE: Multiplanar CT images of the thoracic spine were reconstructed from contemporary CT of the Chest. RADIATION DOSE REDUCTION: This exam was performed according to the departmental dose-optimization program which includes automated exposure control, adjustment of the mA and/or kV according to patient size and/or use of iterative reconstruction technique. CONTRAST:  None or No additional COMPARISON:  None Available. FINDINGS: Alignment: Mild S shaped scoliosis of the spine. Sagittal alignment is within normal limits. Vertebrae: Vertebral body heights are maintained. No definitive fracture is seen. Paraspinal and other soft tissues: No definite acute paravertebral or paraspinal soft tissue abnormality. Disc levels: Advanced multilevel degenerative changes with multilevel degenerative osteophytes and multilevel disc space narrowing. Increased disc height at T5-T6 compared with several levels above and below. IMPRESSION: 1. Advanced multilevel degenerative changes. Vertebral body heights are maintained and no definitive fracture lucency is seen. 2. Increased disc height at T5-T6 compared with several adjacent levels above and below, with overall rigid appearance of spine due to advanced degenerative changes and multilevel osteophytes. Correlate for focal symptoms to the region, MRI could be obtained if presentation suggests potential ligamentous injury. Electronically Signed   By: Luke Bun M.D.   On: 05/24/2024 20:21   CT CHEST ABDOMEN PELVIS W CONTRAST Result Date: 05/24/2024 CLINICAL DATA:  Scott [name] syncope EXAM: CT CHEST, ABDOMEN, AND PELVIS WITH CONTRAST TECHNIQUE: Multidetector CT imaging of the chest, abdomen and pelvis was performed following the standard protocol during bolus administration of intravenous contrast. RADIATION DOSE REDUCTION: This exam was performed according to the departmental dose-optimization program which includes automated exposure control,  adjustment of the mA and/or kV according to patient size and/or use of iterative reconstruction technique. CONTRAST:  OMNIPAQUE  IOHEXOL  300 MG/ML  SOLN COMPARISON:  CT 04/16/2019 FINDINGS: CT CHEST FINDINGS Cardiovascular: Moderate aortic atherosclerosis. Post CABG changes. Ascending aortic diameter up to 4.1 cm. Coronary vascular calcification. Mild cardiomegaly. No pericardial effusion Mediastinum/Nodes: Patent trachea. No thyroid  mass. No suspicious lymph nodes. Esophagus within normal limits. Lungs/Pleura: No acute airspace disease, pleural effusion, or pneumothorax. Bleb at the medial right base. Musculoskeletal: Sternotomy.  See separately dictated spine CT CT ABDOMEN PELVIS FINDINGS Hepatobiliary: No focal liver abnormality is seen. No gallstones, gallbladder wall thickening, or biliary dilatation. Pancreas: Unremarkable. No pancreatic ductal dilatation or surrounding inflammatory changes. Spleen: Inhomogeneous enhancement probably due to phase of enhancement. Adrenals/Urinary Tract: Adrenal glands are normal. The kidneys show no hydronephrosis. Renal cysts for which no imaging follow-up is recommended. The bladder is normal Stomach/Bowel: Stomach nonenlarged. No dilated small bowel. No acute bowel wall thickening. Mild diverticular disease of the colon Vascular/Lymphatic: Aortic atherosclerosis. No enlarged abdominal or pelvic lymph nodes. Reproductive: Markedly enlarged prostate Other: Negative for pelvic effusion or free air. Small fat containing right inguinal hernia. Small fat containing umbilical hernia Musculoskeletal: Right hip replacement. Multilevel degenerative changes. No acute osseous abnormality. IMPRESSION: 1. Negative for acute intrathoracic, abdominal, or pelvic abnormality. 2. Mild diverticular disease of the colon without acute inflammatory process. 3. Markedly enlarged prostate. 4. Aortic atherosclerosis. Ascending aortic diameter up to 4.1 cm. Recommend annual imaging followup by  CTA or MRA. This recommendation follows 2010 ACCF/AHA/AATS/ACR/ASA/SCA/SCAI/SIR/STS/SVM Guidelines for the Diagnosis and Management of Patients with Thoracic Aortic Disease. Circulation. 2010; 121: Z733-z630. Aortic aneurysm NOS (ICD10-I71.9) Aortic Atherosclerosis (ICD10-I70.0). Electronically Signed   By: Luke Bun M.D.   On: 05/24/2024 20:21   CT Head Wo Contrast  Result Date: 05/24/2024 EXAM: CT HEAD AND CERVICAL SPINE 05/24/2024 07:11:30 PM TECHNIQUE: CT of the head and cervical spine was performed without the administration of intravenous contrast. Multiplanar reformatted images are provided for review. Automated exposure control, iterative reconstruction, and/or weight based adjustment of the mA/kV was utilized to reduce the radiation dose to as low as reasonably achievable. COMPARISON: None available. CLINICAL HISTORY: 89 y/o male. Patient suffered fall after syncopal episode today. Hit back of head. FINDINGS: CT HEAD BRAIN AND VENTRICLES: No acute intracranial hemorrhage. No mass effect or midline shift. No abnormal extra-axial fluid collection. Gray-white differentiation is maintained. No hydrocephalus. Global cortical atrophy. Subcortical and periventricular small vessel ischemic changes. Intracranial atherosclerosis. ORBITS: No acute abnormality. SINUSES AND MASTOIDS: No acute abnormality. SOFT TISSUES AND SKULL: No acute skull fracture. Mild soft tissue swelling overlying the right posterior vertex (sagittal image 133). CT CERVICAL SPINE BONES AND ALIGNMENT: No acute fracture or traumatic malalignment. DEGENERATIVE CHANGES: Moderate degenerative changes of the cervical spine with narrowing of the spinal canal, most prominent at C3-4. SOFT TISSUES: No prevertebral soft tissue swelling. IMPRESSION: 1. Mild soft tissue swelling overlying the right posterior vertex. No acute intracranial abnormality. Atrophy with small vessel ischemic changes. 2. No traumatic injury to the cervical spine. Moderate  degenerative changes, most prominent at C3-4. Electronically signed by: Pinkie Pebbles MD 05/24/2024 07:38 PM EDT RP Workstation: [id number]   CT Cervical Spine Wo Contrast Result Date: 05/24/2024 EXAM: CT HEAD AND CERVICAL SPINE 05/24/2024 07:11:30 PM TECHNIQUE: CT of the head and cervical spine was performed without the administration of intravenous contrast. Multiplanar reformatted images are provided for review. Automated exposure control, iterative reconstruction, and/or weight based adjustment of the mA/kV was utilized to reduce the radiation dose to as low as reasonably achievable. COMPARISON: None available. CLINICAL HISTORY: 89 y/o male. Patient suffered fall after syncopal episode today. Hit back of head. FINDINGS: CT HEAD BRAIN AND VENTRICLES: No acute intracranial hemorrhage. No mass effect or midline shift. No abnormal extra-axial fluid collection. Gray-white differentiation is maintained. No hydrocephalus. Global cortical atrophy. Subcortical and periventricular small vessel ischemic changes. Intracranial atherosclerosis. ORBITS: No acute abnormality. SINUSES AND MASTOIDS: No acute abnormality. SOFT TISSUES AND SKULL: No acute skull fracture. Mild soft tissue swelling overlying the right posterior vertex (sagittal image 133). CT CERVICAL SPINE BONES AND ALIGNMENT: No acute fracture or traumatic malalignment. DEGENERATIVE CHANGES: Moderate degenerative changes of the cervical spine with narrowing of the spinal canal, most prominent at C3-4. SOFT TISSUES: No prevertebral soft tissue swelling. IMPRESSION: 1. Mild soft tissue swelling overlying the right posterior vertex. No acute intracranial abnormality. Atrophy with small vessel ischemic changes. 2. No traumatic injury to the cervical spine. Moderate degenerative changes, most prominent at C3-4. Electronically signed by: Pinkie Pebbles MD 05/24/2024 07:38 PM EDT RP Workstation: [id number]    Cardiac Studies   See echo above  Assessment    Principal Problem:   Acute on chronic diastolic CHF (congestive heart failure) (HCC) Active Problems:   Obstructive sleep apnea   Pure hypercholesterolemia   Obesity   Hypertension   GERD (gastroesophageal reflux disease)   CAD (coronary artery disease)   BPH (benign prostatic hyperplasia)   Anxiety   Anemia   Chronic diastolic CHF (congestive heart failure) (HCC)   Abnormal ECG   Syncope and collapse   Plan   Scott [name] appears near euvolemic today- creatinine has trended up. Will give final dose of IV lasix  today, switch back to oral lasix  20 mg daily tomorrow. Monitor creatinine. Still  having dry coughing fits - had one last night, may be related to chronic lung disease - suspect post-tussive syncope.  Time Spent Directly with Patient:  I have spent a total of 25 minutes with the patient reviewing hospital notes, telemetry, EKGs, labs and examining the patient as well as establishing an assessment and plan that was discussed personally with the patient.  > 50% of time was spent in direct patient care.  Length of Stay:  LOS: 1 day   Vinie KYM Maxcy, MD, [facility], FNLA, FACP  [place]  [facility]  Medical Director of the [facility]  Card[facility]ovascular Risk Reduction Clinic Diplomate of the American Board of Clinical Lipidology Attending Cardiologist  Direct Dial: [phone]  Fax: [phone]  Website:  [email or web address] Vinie JAYSON Maxcy 05/26/2024, 8:55 AM

## 2024-05-26 NOTE — Progress Notes (Signed)
 PROGRESS NOTE    Scott [name]  FMW:[id number] DOB: 04/26/1928 DOA: 05/24/2024 PCP: [facility]  89/M with history of CAD/CABG, COPD, OSA, hypertension, dyslipidemia, GERD presented to the ED 6/20 after syncopal event following a coughing spell, in addition patient family reported ongoing dyspnea on exertion and edema for several weeks, takes Lasix  sporadically as he attributes it to his chronic constipation. - In the ED , heart rate 50s-70s troponin 38, 36, proBNP 2422   Subjective: -Feels fair, no events overnight, concerned about constipation from Lasix   Assessment and Plan:  Acute on chronic diastolic CHF -Last echo 2020 with a EF 60-65%, impaired relaxation -Poor compliance with Lasix  at baseline, attributes it to constipation -Repeat echo yesterday with EF  60-65, moderately dilated RA and LA -Clinically appears volume overloaded, cards following, Lasix  dose decreased today - Add Aldactone tomorrow if BP, kidney function tolerates -ARB and metoprolol  on hold   A-fib ruled out -Per cards felt to be sinus with PACs as well as Mobitz   Hypertension - Lasix  as above - Holding hydrochlorothiazide , telmisartan   Hyperlipidemia - Replace  lovastatin with formulary pravastatin    GERD - Continue home PPI   BPH - Continue tamsulosin    CAD - Continue home ASA, statin   Anxiety - Continue trazodone    OSA - Continue home CPAP   Obesity - Noted   DVT prophylaxis:      SCDs, p previous adverse reaction to Lovenox/heparin products. Code Status:              Full Family Communication: Daughter at bedside Disposition Plan: Home likely 1 to 2 days  Consultants:    Procedures:   Antimicrobials:    Objective: Vitals:   05/26/24 0042 05/26/24 0527 05/26/24 0827 05/26/24 1131  BP: (!) 107/54 (!) 108/53 (!) 96/52   Pulse: 73 76 84 78  Resp: 19 19 18 18   Temp: 99.1 F (37.3 C) 99.4 F (37.4 C) 98.8 F (37.1 C) 98.7 F (37.1 C)  TempSrc:  Oral Oral Oral Oral  SpO2: 94% 97% 98% 99%  Weight:  86.1 kg    Height:        Intake/Output Summary (Last 24 hours) at 05/26/2024 1133 Last data filed at 05/26/2024 0049 Gross per 24 hour  Intake 267 ml  Output 1250 ml  Net -983 ml   Filed Weights   05/24/24 1654 05/26/24 0527  Weight: 89.4 kg 86.1 kg    Examination:  General exam: Appears calm and comfortable  HEENT: Positive JVD Respiratory system: Clear to auscultation Cardiovascular system: S1 & S2 heard, RRR.  Abd: nondistended, soft and nontender.Normal bowel sounds heard. Central nervous system: Alert and oriented. No focal neurological deficits. Extremities: 2 plus edema Skin: No rashes Psychiatry:  Mood & affect appropriate.     Data Reviewed:   CBC: Recent Labs  Lab 05/24/24 1700 05/25/24 1601 05/26/24 0304  WBC 9.6 7.4 7.9  HGB 12.7* 13.3 11.7*  HCT 40.1 42.8 37.4*  MCV 100.3* 99.1 99.2  PLT 154 142* 132*   Basic Metabolic Panel: Recent Labs  Lab 05/24/24 1700 05/25/24 1601 05/26/24 0304  NA 137 137 135  K 3.9 3.9 4.0  CL 95* 91* 93*  CO2 31 37* 32  GLUCOSE 166* 114* 115*  BUN 16 13 16   CREATININE 1.21 1.17 1.38*  CALCIUM 8.8* 9.1 8.5*  MG  --  2.1 2.0   GFR: Estimated Creatinine Clearance: 33.7 mL/min (A) (by C-G formula based on SCr  of 1.38 mg/dL (H)). Liver Function Tests: Recent Labs  Lab 05/25/24 1601 05/26/24 0304  AST 20 14*  ALT 13 12  ALKPHOS 69 56  BILITOT 1.6* 1.9*  PROT 6.4* 5.4*  ALBUMIN 3.5 3.0*   No results for input(s): LIPASE, AMYLASE in the last 168 hours. No results for input(s): AMMONIA in the last 168 hours. Coagulation Profile: Recent Labs  Lab 05/24/24 1801  INR 1.0   Cardiac Enzymes: No results for input(s): CKTOTAL, CKMB, CKMBINDEX, TROPONINI in the last 168 hours. BNP (last 3 results) Recent Labs    05/24/24 1702  PROBNP 2,422.0*   HbA1C: No results for input(s): HGBA1C in the last 72 hours. CBG: No results for input(s):  GLUCAP in the last 168 hours. Lipid Profile: No results for input(s): CHOL, HDL, LDLCALC, TRIG, CHOLHDL, LDLDIRECT in the last 72 hours. Thyroid  Function Tests: Recent Labs    05/25/24 1601  TSH 2.185   Anemia Panel: No results for input(s): VITAMINB12, FOLATE, FERRITIN, TIBC, IRON, RETICCTPCT in the last 72 hours. Urine analysis:    Component Value Date/Time   COLORURINE YELLOW 05/24/2024 1930   APPEARANCEUR CLEAR 05/24/2024 1930   APPEARANCEUR Clear 09/28/2023 0912   LABSPEC 1.015 05/24/2024 1930   PHURINE 7.5 05/24/2024 1930   GLUCOSEU NEGATIVE 05/24/2024 1930   HGBUR NEGATIVE 05/24/2024 1930   BILIRUBINUR NEGATIVE 05/24/2024 1930   BILIRUBINUR Negative 09/28/2023 0912   KETONESUR NEGATIVE 05/24/2024 1930   PROTEINUR 100 (A) 05/24/2024 1930   NITRITE NEGATIVE 05/24/2024 1930   LEUKOCYTESUR NEGATIVE 05/24/2024 1930   Sepsis Labs: @LABRCNTIP (procalcitonin:4,lacticidven:4)  )No results found for this or any previous visit (from the past 240 hours).   Radiology Studies: ECHOCARDIOGRAM COMPLETE Result Date: 05/25/2024    ECHOCARDIOGRAM REPORT   Patient Name:   Scott [name] Date of Exam: 05/25/2024 Medical Rec #:  [id number]       Height:       65.0 in Accession #:    [id number]      Weight:       197.0 lb Date of Birth:  06/24/1928       BSA:          1.965 m Patient Age:    89 years        BP:           102/61 mmHg Patient Gender: M               HR:           68 bpm. Exam Location:  Inpatient Procedure: 2D Echo, Cardiac Doppler and Color Doppler (Both Spectral and Color            Flow Doppler were utilized during procedure). Indications:    Atrial Fibrillation I48.91, CHF I50.31  History:        Patient has prior history of Echocardiogram examinations, most                 recent 09/10/2019. CHF, CAD, Signs/Symptoms:Dyspnea and Shortness                 of Breath; Risk Factors:Hypertension and Sleep Apnea.  Sonographer:    Thea Norlander RCS Referring  Phys: [id number] LOGAN N LOCKWOOD IMPRESSIONS  1. Left ventricular ejection fraction, by estimation, is 60 to 65%. The left ventricle has normal function. The left ventricle has no regional wall motion abnormalities. Left ventricular diastolic parameters are indeterminate.  2. Right ventricular systolic function is normal. The right ventricular size is normal.  3. Left atrial size was moderately dilated.  4. Right atrial size was moderately dilated.  5. The mitral valve is abnormal. Mild mitral valve regurgitation. No evidence of mitral stenosis.  6. The aortic valve is tricuspid. There is moderate calcification of the aortic valve. Aortic valve regurgitation is trivial. Aortic valve sclerosis is present, with no evidence of aortic valve stenosis.  7. There is moderate dilatation of the ascending aorta, measuring 44 mm.  8. The inferior vena cava is normal in size with greater than 50% respiratory variability, suggesting right atrial pressure of 3 mmHg. FINDINGS  Left Ventricle: Left ventricular ejection fraction, by estimation, is 60 to 65%. The left ventricle has normal function. The left ventricle has no regional wall motion abnormalities. Strain was performed and the global longitudinal strain is indeterminate. The left ventricular internal cavity size was normal in size. There is no left ventricular hypertrophy. Left ventricular diastolic parameters are indeterminate. Right Ventricle: The right ventricular size is normal. No increase in right ventricular wall thickness. Right ventricular systolic function is normal. Left Atrium: Left atrial size was moderately dilated. Right Atrium: Right atrial size was moderately dilated. Pericardium: There is no evidence of pericardial effusion. Mitral Valve: The mitral valve is abnormal. There is mild thickening of the mitral valve leaflet(s). There is mild calcification of the mitral valve leaflet(s). Mild mitral valve regurgitation. No evidence of mitral valve stenosis.  Tricuspid Valve: The tricuspid valve is normal in structure. Tricuspid valve regurgitation is mild . No evidence of tricuspid stenosis. Aortic Valve: The aortic valve is tricuspid. There is moderate calcification of the aortic valve. Aortic valve regurgitation is trivial. Aortic valve sclerosis is present, with no evidence of aortic valve stenosis. Aortic valve peak gradient measures 7.1  mmHg. Pulmonic Valve: The pulmonic valve was normal in structure. Pulmonic valve regurgitation is not visualized. No evidence of pulmonic stenosis. Aorta: The aortic root is normal in size and structure. There is moderate dilatation of the ascending aorta, measuring 44 mm. Venous: The inferior vena cava is normal in size with greater than 50% respiratory variability, suggesting right atrial pressure of 3 mmHg. IAS/Shunts: No atrial level shunt detected by color flow Doppler. Additional Comments: 3D was performed not requiring image post processing on an independent workstation and was indeterminate.  LEFT VENTRICLE PLAX 2D LVIDd:         4.60 cm   Diastology LVIDs:         2.80 cm   LV e' medial:    7.93 cm/s LV PW:         1.10 cm   LV E/e' medial:  18.9 LV IVS:        1.00 cm   LV e' lateral:   11.20 cm/s LVOT diam:     2.10 cm   LV E/e' lateral: 13.4 LV SV:         48 LV SV Index:   25 LVOT Area:     3.46 cm  RIGHT VENTRICLE            IVC RV S prime:     8.70 cm/s  IVC diam: 2.20 cm TAPSE (M-mode): 1.6 cm LEFT ATRIUM             Index        RIGHT ATRIUM           Index LA diam:        5.20 cm 2.65 cm/m   RA Area:     14.90 cm  LA Vol (A2C):   57.2 ml 29.10 ml/m  RA Volume:   38.90 ml  19.79 ml/m LA Vol (A4C):   56.5 ml 28.75 ml/m LA Biplane Vol: 60.4 ml 30.73 ml/m  AORTIC VALVE AV Area (Vmax): 2.24 cm AV Vmax:        133.00 cm/s AV Peak Grad:   7.1 mmHg LVOT Vmax:      86.17 cm/s LVOT Vmean:     54.667 cm/s LVOT VTI:       0.140 m  AORTA Ao Root diam: 3.40 cm Ao Asc diam:  4.40 cm MITRAL VALVE                TRICUSPID  VALVE MV Area (PHT): 4.29 cm     TR Peak grad:   38.7 mmHg MV Decel Time: 177 msec     TR Vmax:        311.00 cm/s MV E velocity: 150.00 cm/s                             SHUNTS                             Systemic VTI:  0.14 m                             Systemic Diam: 2.10 cm Maude Emmer MD Electronically signed by Maude Emmer MD Signature Date/Time: 05/25/2024/4:47:35 PM    Final    CT T-SPINE NO CHARGE Result Date: 05/24/2024 CLINICAL DATA:  Back pain after fall EXAM: CT Thoracic Spine without contrast TECHNIQUE: Multiplanar CT images of the thoracic spine were reconstructed from contemporary CT of the Chest. RADIATION DOSE REDUCTION: This exam was performed according to the departmental dose-optimization program which includes automated exposure control, adjustment of the mA and/or kV according to patient size and/or use of iterative reconstruction technique. CONTRAST:  None or No additional COMPARISON:  None Available. FINDINGS: Alignment: Mild S shaped scoliosis of the spine. Sagittal alignment is within normal limits. Vertebrae: Vertebral body heights are maintained. No definitive fracture is seen. Paraspinal and other soft tissues: No definite acute paravertebral or paraspinal soft tissue abnormality. Disc levels: Advanced multilevel degenerative changes with multilevel degenerative osteophytes and multilevel disc space narrowing. Increased disc height at T5-T6 compared with several levels above and below. IMPRESSION: 1. Advanced multilevel degenerative changes. Vertebral body heights are maintained and no definitive fracture lucency is seen. 2. Increased disc height at T5-T6 compared with several adjacent levels above and below, with overall rigid appearance of spine due to advanced degenerative changes and multilevel osteophytes. Correlate for focal symptoms to the region, MRI could be obtained if presentation suggests potential ligamentous injury. Electronically Signed   By: Luke Bun M.D.   On:  05/24/2024 20:21   CT CHEST ABDOMEN PELVIS W CONTRAST Result Date: 05/24/2024 CLINICAL DATA:  Clem[place] at home syncope EXAM: CT CHEST, ABDOMEN, AND PELVIS WITH CONTRAST TECHNIQUE: Multidetector CT imaging of the chest, abdomen and pelvis was performed following the standard protocol during bolus administration of intravenous contrast. RADIATION DOSE REDUCTION: This exam was performed according to the departmental dose-optimization program which includes automated exposure control, adjustment of the mA and/or kV according to patient size and/or use of iterative reconstruction technique. CONTRAST:  OMNIPAQUE  IOHEXOL  300 MG/ML  SOLN COMPARISON:  CT 04/16/2019 FINDINGS: CT CHEST FINDINGS  Cardiovascular: Moderate aortic atherosclerosis. Post CABG changes. Ascending aortic diameter up to 4.1 cm. Coronary vascular calcification. Mild cardiomegaly. No pericardial effusion Mediastinum/Nodes: Patent trachea. No thyroid  mass. No suspicious lymph nodes. Esophagus within normal limits. Lungs/Pleura: No acute airspace disease, pleural effusion, or pneumothorax. Bleb at the medial right base. Musculoskeletal: Sternotomy.  See separately dictated spine CT CT ABDOMEN PELVIS FINDINGS Hepatobiliary: No focal liver abnormality is seen. No gallstones, gallbladder wall thickening, or biliary dilatation. Pancreas: Unremarkable. No pancreatic ductal dilatation or surrounding inflammatory changes. Spleen: Inhomogeneous enhancement probably due to phase of enhancement. Adrenals/Urinary Tract: Adrenal glands are normal. The kidneys show no hydronephrosis. Renal cysts for which no imaging follow-up is recommended. The bladder is normal Stomach/Bowel: Stomach nonenlarged. No dilated small bowel. No acute bowel wall thickening. Mild diverticular disease of the colon Vascular/Lymphatic: Aortic atherosclerosis. No enlarged abdominal or pelvic lymph nodes. Reproductive: Markedly enlarged prostate Other: Negative for pelvic effusion or free  air. Small fat containing right inguinal hernia. Small fat containing umbilical hernia Musculoskeletal: Right hip replacement. Multilevel degenerative changes. No acute osseous abnormality. IMPRESSION: 1. Negative for acute intrathoracic, abdominal, or pelvic abnormality. 2. Mild diverticular disease of the colon without acute inflammatory process. 3. Markedly enlarged prostate. 4. Aortic atherosclerosis. Ascending aortic diameter up to 4.1 cm. Recommend annual imaging followup by CTA or MRA. This recommendation follows 2010 ACCF/AHA/AATS/ACR/ASA/SCA/SCAI/SIR/STS/SVM Guidelines for the Diagnosis and Management of Patients with Thoracic Aortic Disease. Circulation. 2010; 121: Z733-z630. Aortic aneurysm NOS (ICD10-I71.9) Aortic Atherosclerosis (ICD10-I70.0). Electronically Signed   By: Luke Bun M.D.   On: 05/24/2024 20:21   CT Head Wo Contrast Result Date: 05/24/2024 EXAM: CT HEAD AND CERVICAL SPINE 05/24/2024 07:11:30 PM TECHNIQUE: CT of the head and cervical spine was performed without the administration of intravenous contrast. Multiplanar reformatted images are provided for review. Automated exposure control, iterative reconstruction, and/or weight based adjustment of the mA/kV was utilized to reduce the radiation dose to as low as reasonably achievable. COMPARISON: None available. CLINICAL HISTORY: 89 y/o male. Patient suffered fall after syncopal episode today. Hit back of head. FINDINGS: CT HEAD BRAIN AND VENTRICLES: No acute intracranial hemorrhage. No mass effect or midline shift. No abnormal extra-axial fluid collection. Gray-white differentiation is maintained. No hydrocephalus. Global cortical atrophy. Subcortical and periventricular small vessel ischemic changes. Intracranial atherosclerosis. ORBITS: No acute abnormality. SINUSES AND MASTOIDS: No acute abnormality. SOFT TISSUES AND SKULL: No acute skull fracture. Mild soft tissue swelling overlying the right posterior vertex (sagittal image 133).  CT CERVICAL SPINE BONES AND ALIGNMENT: No acute fracture or traumatic malalignment. DEGENERATIVE CHANGES: Moderate degenerative changes of the cervical spine with narrowing of the spinal canal, most prominent at C3-4. SOFT TISSUES: No prevertebral soft tissue swelling. IMPRESSION: 1. Mild soft tissue swelling overlying the right posterior vertex. No acute intracranial abnormality. Atrophy with small vessel ischemic changes. 2. No traumatic injury to the cervical spine. Moderate degenerative changes, most prominent at C3-4. Electronically signed by: Pinkie Pebbles MD 05/24/2024 07:38 PM EDT RP Workstation: [id number]   CT Cervical Spine Wo Contrast Result Date: 05/24/2024 EXAM: CT HEAD AND CERVICAL SPINE 05/24/2024 07:11:30 PM TECHNIQUE: CT of the head and cervical spine was performed without the administration of intravenous contrast. Multiplanar reformatted images are provided for review. Automated exposure control, iterative reconstruction, and/or weight based adjustment of the mA/kV was utilized to reduce the radiation dose to as low as reasonably achievable. COMPARISON: None available. CLINICAL HISTORY: 89 y/o male. Patient suffered fall after syncopal episode today. Hit back of head. FINDINGS: CT HEAD  BRAIN AND VENTRICLES: No acute intracranial hemorrhage. No mass effect or midline shift. No abnormal extra-axial fluid collection. Gray-white differentiation is maintained. No hydrocephalus. Global cortical atrophy. Subcortical and periventricular small vessel ischemic changes. Intracranial atherosclerosis. ORBITS: No acute abnormality. SINUSES AND MASTOIDS: No acute abnormality. SOFT TISSUES AND SKULL: No acute skull fracture. Mild soft tissue swelling overlying the right posterior vertex (sagittal image 133). CT CERVICAL SPINE BONES AND ALIGNMENT: No acute fracture or traumatic malalignment. DEGENERATIVE CHANGES: Moderate degenerative changes of the cervical spine with narrowing of the spinal canal, most  prominent at C3-4. SOFT TISSUES: No prevertebral soft tissue swelling. IMPRESSION: 1. Mild soft tissue swelling overlying the right posterior vertex. No acute intracranial abnormality. Atrophy with small vessel ischemic changes. 2. No traumatic injury to the cervical spine. Moderate degenerative changes, most prominent at C3-4. Electronically signed by: Pinkie Pebbles MD 05/24/2024 07:38 PM EDT RP Workstation: [id number]     Scheduled Meds:  aspirin   81 mg Oral QPM   fluticasone  furoate-vilanterol  1 puff Inhalation Daily   [START ON 05/27/2024] furosemide   20 mg Oral Daily   pantoprazole   20 mg Oral Daily   polyethylene glycol  17 g Oral BID   potassium chloride   10 mEq Oral BID   pravastatin   20 mg Oral q1800   sodium chloride  flush  3 mL Intravenous Q12H   tamsulosin   0.4 mg Oral QHS   traZODone   50 mg Oral QHS   Continuous Infusions:   LOS: 1 day    Time spent:    Sigurd Pac, MD [facility]   05/26/2024, 11:33 AM

## 2024-05-26 NOTE — Plan of Care (Signed)
  Problem: Health Behavior/Discharge Planning: Goal: Ability to manage health-related needs will improve Outcome: Progressing   Problem: Clinical Measurements: Goal: Diagnostic test results will improve Outcome: Progressing Goal: Cardiovascular complication will be avoided Outcome: Progressing   Problem: Activity: Goal: Risk for activity intolerance will decrease Outcome: Progressing   Problem: Nutrition: Goal: Adequate nutrition will be maintained Outcome: Progressing   Problem: Coping: Goal: Level of anxiety will decrease Outcome: Progressing

## 2024-05-26 NOTE — Evaluation (Signed)
 Physical Therapy Evaluation Patient Details Name: Scott [name] MRN: [id number] DOB: [birth year] Today's Date: 05/26/2024  History of Present Illness  Pt is a 89 y.o. M who presents 05/24/2024 after syncopal event following coughing spell in addition patient family reporting ongoing DOE and edema for several weeks. In the ED , heart rate 50s-70s troponin 38, 36, proBNP 2422. Imaging negative for traumatic injury to cervical or thoracic spine. Significant PMH: CAD/CABG, COPD, OSA, hypertension, dyslipidemia, GERD  Clinical Impression  PTA, pt lives with his daughter and uses a RW for ambulation. Pt reports baseline R arthritic knee. Pt overall appears to be fairly close to his functional baseline. Pt ambulating 60 ft with a walker, with slowed step to pattern, but overall steady. Further distance limited due to pt needing to return to bathroom. Spo2 94% on RA, BP 111/59 (73) sitting, 114/52 (71), standing. Pt reports history of vertigo and has been treated with Trenda Craze maneuver, but currently denies symptoms. Will continue to follow acutely, but don't anticipate need for follow up PT.       If plan is discharge home, recommend the following: Assistance with cooking/housework;Assist for transportation;Help with stairs or ramp for entrance   Can travel by private vehicle        Equipment Recommendations None recommended by PT  Recommendations for Other Services       Functional Status Assessment Patient has had a recent decline in their functional status and demonstrates the ability to make significant improvements in function in a reasonable and predictable amount of time.     Precautions / Restrictions Precautions Precautions: Fall Restrictions Weight Bearing Restrictions Per Provider Order: No      Mobility  Bed Mobility Overal bed mobility: Modified Independent             General bed mobility comments: Increased time/effort, but no physical assist     Transfers Overall transfer level: Needs assistance Equipment used: Rolling walker (2 wheels) Transfers: Sit to/from Stand Sit to Stand: Contact guard assist, Min assist           General transfer comment: CGA from edge of bed, minA from lower toilet surface    Ambulation/Gait Ambulation/Gait assistance: Contact guard assist, Supervision Gait Distance (Feet): 60 Feet Assistive device: Rolling walker (2 wheels) Gait Pattern/deviations: Step-to pattern, Decreased stride length Gait velocity: decreased     General Gait Details: Step to pattern, overall steady with RW  Stairs            Wheelchair Mobility     Tilt Bed    Modified Rankin (Stroke Patients Only)       Balance Overall balance assessment: Mild deficits observed, not formally tested                                           Pertinent Vitals/Pain Pain Assessment Pain Assessment: Faces Faces Pain Scale: Hurts little more Pain Location: R knee (hx of arthritis per pt) Pain Descriptors / Indicators: Grimacing, Discomfort Pain Intervention(s): Monitored during session    Home Living Family/patient expects to be discharged to:: Private residence Living Arrangements: Children (daughter, does not work) Available Help at Discharge: Family Type of Home: House Home Access: Stairs to enter Entrance Stairs-Rails: None Entrance Stairs-Number of Steps: 2 (2 through garage, 4 through front door)   Home Layout: One level Home Equipment: Other (comment);Rolling Walker (2 wheels);Cane - single point;Grab  bars - tub/shower;Adaptive equipment (3 wheeled walker)      Prior Function Prior Level of Function : Independent/Modified Independent;Driving             Mobility Comments: using RW ADLs Comments: Uses reacher for pants, having difficulty with LB dressing. has been showering in walk in shower     Extremity/Trunk Assessment   Upper Extremity Assessment Upper Extremity  Assessment: Defer to OT evaluation    Lower Extremity Assessment Lower Extremity Assessment: RLE deficits/detail;LLE deficits/detail RLE Deficits / Details: At least 3/5 strength, increased edema, L >R LLE Deficits / Details: At least 3/5 strength, increased edema L>R    Cervical / Trunk Assessment Cervical / Trunk Assessment: Other exceptions (forward head posture)  Communication   Communication Communication: No apparent difficulties    Cognition Arousal: Alert Behavior During Therapy: WFL for tasks assessed/performed   PT - Cognitive impairments: No apparent impairments                         Following commands: Intact       Cueing Cueing Techniques: Verbal cues     General Comments      Exercises     Assessment/Plan    PT Assessment Patient needs continued PT services  PT Problem List Decreased strength;Decreased activity tolerance;Decreased balance;Decreased mobility;Pain       PT Treatment Interventions DME instruction;Gait training;Stair training;Functional mobility training;Therapeutic activities;Therapeutic exercise;Balance training;Patient/family education    PT Goals (Current goals can be found in the Care Plan section)  Acute Rehab PT Goals Patient Stated Goal: go home PT Goal Formulation: With patient Time For Goal Achievement: 06/09/24 Potential to Achieve Goals: Good    Frequency Min 1X/week     Co-evaluation               AM-PAC PT 6 Clicks Mobility  Outcome Measure Help needed turning from your back to your side while in a flat bed without using bedrails?: None Help needed moving from lying on your back to sitting on the side of a flat bed without using bedrails?: None Help needed moving to and from a bed to a chair (including a wheelchair)?: A Little Help needed standing up from a chair using your arms (e.g., wheelchair or bedside chair)?: A Little Help needed to walk in hospital room?: A Little Help needed climbing  3-5 steps with a railing? : A Little 6 Click Score: 20    End of Session Equipment Utilized During Treatment: Gait belt Activity Tolerance: Patient tolerated treatment well Patient left: in chair;with call bell/phone within reach;with chair alarm set Nurse Communication: Mobility status PT Visit Diagnosis: Other abnormalities of gait and mobility (R26.89);Difficulty in walking, not elsewhere classified (R26.2)    Time: 1233-1300 PT Time Calculation (min) (ACUTE ONLY): 27 min   Charges:   PT Evaluation $PT Eval Low Complexity: 1 Low PT Treatments $Therapeutic Activity: 8-22 mins PT General Charges $$ ACUTE PT VISIT: 1 Visit         Aleck Daring, PT, DPT Acute Rehabilitation Services Office [phone]   Alayne ONEIDA Daring 05/26/2024, 1:52 PM

## 2024-05-27 DIAGNOSIS — I5033 Acute on chronic diastolic (congestive) heart failure: Secondary | ICD-10-CM | POA: Diagnosis not present

## 2024-05-27 LAB — BASIC METABOLIC PANEL WITH GFR
Anion gap: 7 (ref 5–15)
BUN: 22 mg/dL (ref 8–23)
CO2: 33 mmol/L — ABNORMAL HIGH (ref 22–32)
Calcium: 8 mg/dL — ABNORMAL LOW (ref 8.9–10.3)
Chloride: 93 mmol/L — ABNORMAL LOW (ref 98–111)
Creatinine, Ser: 1.58 mg/dL — ABNORMAL HIGH (ref 0.61–1.24)
GFR, Estimated: 41 mL/min — ABNORMAL LOW (ref >60)
Glucose, Bld: 109 mg/dL — ABNORMAL HIGH (ref 70–99)
Potassium: 4.2 mmol/L (ref 3.5–5.1)
Sodium: 133 mmol/L — ABNORMAL LOW (ref 135–145)

## 2024-05-27 LAB — CBC
HCT: 35.9 % — ABNORMAL LOW (ref 39.0–52.0)
Hemoglobin: 11.6 g/dL — ABNORMAL LOW (ref 13.0–17.0)
MCH: 31.4 pg (ref 26.0–34.0)
MCHC: 32.3 g/dL (ref 30.0–36.0)
MCV: 97 fL (ref 80.0–100.0)
Platelets: 118 10*3/uL — ABNORMAL LOW (ref 150–400)
RBC: 3.7 MIL/uL — ABNORMAL LOW (ref 4.22–5.81)
RDW: 13.5 % (ref 11.5–15.5)
WBC: 8.6 10*3/uL (ref 4.0–10.5)
nRBC: 0 % (ref 0.0–0.2)

## 2024-05-27 MED ORDER — FUROSEMIDE 20 MG PO TABS: 20.0000 mg | ORAL_TABLET | Freq: Every day | ORAL | Status: DC

## 2024-05-27 MED ORDER — POLYETHYLENE GLYCOL 3350 17 G PO PACK: 17.0000 g | PACK | Freq: Every day | ORAL | Status: DC

## 2024-05-27 MED ORDER — SENNOSIDES-DOCUSATE SODIUM 8.6-50 MG PO TABS: 1.0000 | ORAL_TABLET | Freq: Two times a day (BID) | ORAL | 0 refills | Status: DC

## 2024-05-27 NOTE — Progress Notes (Signed)
 DAILY PROGRESS NOTE   Patient Name: Scott [name] Date of Encounter: 05/27/2024 Cardiologist: Peter Swaziland, MD  Chief Complaint   Breathing better  Patient Profile   Scott [name] is a 89 y.o. male with a hx of CAD s/p CABG in 2007 (LIMA to LAD, SVG to OM 2, SVG to Acute marginal and posterior lateral branch), Hypertension, Hyperlipidemia, GERD, COPD, and OSA who is being seen 05/25/2024 for the evaluation of CHF and AF at the request of Almarie Ku MD.   Subjective   Breathing is good - negative another 357 cc overnight. Creatine up to 1.58 today (sodium 133). At end-diuresis.  Objective   Vitals:   05/26/24 2302 05/27/24 0524 05/27/24 0626 05/27/24 0726  BP: (!) 112/51 (!) 114/52  115/63  Pulse: (!) 102 93  82  Resp: 16 14  20   Temp: 99.3 F (37.4 C) 98.7 F (37.1 C)  98.3 F (36.8 C)  TempSrc: Oral Oral  Oral  SpO2: 92% 92%  90%  Weight:   86.7 kg   Height:        Intake/Output Summary (Last 24 hours) at 05/27/2024 0847 Last data filed at 05/27/2024 0846 Gross per 24 hour  Intake 603 ml  Output 600 ml  Net 3 ml   Filed Weights   05/24/24 1654 05/26/24 0527 05/27/24 0626  Weight: 89.4 kg 86.1 kg 86.7 kg    Physical Exam   General appearance: alert, appears stated age, and no distress Neck: no carotid bruit and no JVD Lungs: clear to auscultation bilaterally Heart: regular rate and rhythm Extremities: edema trace sockline Neurologic: Grossly normal  Inpatient Medications    Scheduled Meds:  aspirin   81 mg Oral QPM   fluticasone  furoate-vilanterol  1 puff Inhalation Daily   furosemide   20 mg Oral Daily   pantoprazole   20 mg Oral Daily   polyethylene glycol  17 g Oral BID   potassium chloride   10 mEq Oral BID   pravastatin   20 mg Oral q1800   sodium chloride  flush  3 mL Intravenous Q12H   tamsulosin   0.4 mg Oral QHS   traZODone   50 mg Oral QHS    Continuous Infusions:   PRN Meds: albuterol    Labs   Results for orders placed or  performed during the hospital encounter of 05/24/24 (from the past 48 hours)  Comprehensive metabolic panel     Status: Abnormal   Collection Time: 05/25/24  4:01 PM  Result Value Ref Range   Sodium 137 135 - 145 mmol/L   Potassium 3.9 3.5 - 5.1 mmol/L   Chloride 91 (L) 98 - 111 mmol/L   CO2 37 (H) 22 - 32 mmol/L   Glucose, Bld 114 (H) 70 - 99 mg/dL    Comment: Glucose reference range applies only to samples taken after fasting for at least 8 hours.   BUN 13 8 - 23 mg/dL   Creatinine, Ser 8.82 0.61 - 1.24 mg/dL   Calcium 9.1 8.9 - 89.6 mg/dL   Total Protein 6.4 (L) 6.5 - 8.1 g/dL   Albumin 3.5 3.5 - 5.0 g/dL   AST 20 15 - 41 U/L   ALT 13 0 - 44 U/L   Alkaline Phosphatase 69 38 - 126 U/L   Total Bilirubin 1.6 (H) 0.0 - 1.2 mg/dL   GFR, Estimated 58 (L) >60 mL/min    Comment: (NOTE) Calculated using the CKD-EPI Creatinine Equation (2021)    Anion gap 9 5 - 15  Comment: Performed at [facility], [address], [place], [place] [address]  CBC     Status: Abnormal   Collection Time: 05/25/24  4:01 PM  Result Value Ref Range   WBC 7.4 4.0 - 10.5 K/uL   RBC 4.32 4.22 - 5.81 MIL/uL   Hemoglobin 13.3 13.0 - 17.0 g/dL   HCT 57.1 60.9 - 47.9 %   MCV 99.1 80.0 - 100.0 fL   MCH 30.8 26.0 - 34.0 pg   MCHC 31.1 30.0 - 36.0 g/dL   RDW 86.5 88.4 - 84.4 %   Platelets 142 (L) 150 - 400 K/uL    Comment: REPEATED TO VERIFY   nRBC 0.0 0.0 - 0.2 %    Comment: Performed at [facility], [address] [place], [place] [address]  Magnesium     Status: None   Collection Time: 05/25/24  4:01 PM  Result Value Ref Range   Magnesium 2.1 1.7 - 2.4 mg/dL    Comment: Performed at [facility], [address] [place], [place] [address]  TSH     Status: None   Collection Time: 05/25/24  4:01 PM  Result Value Ref Range   TSH 2.185 0.350 - 4.500 uIU/mL    Comment: Performed by a 3rd Generation assay with a functional sensitivity of <=0.01 uIU/mL. Performed at [facility]  [facility], [address], [place], [place] [address]   Comprehensive metabolic panel     Status: Abnormal   Collection Time: 05/26/24  3:04 AM  Result Value Ref Range   Sodium 135 135 - 145 mmol/L   Potassium 4.0 3.5 - 5.1 mmol/L   Chloride 93 (L) 98 - 111 mmol/L   CO2 32 22 - 32 mmol/L   Glucose, Bld 115 (H) 70 - 99 mg/dL    Comment: Glucose reference range applies only to samples taken after fasting for at least 8 hours.   BUN 16 8 - 23 mg/dL   Creatinine, Ser 8.61 (H) 0.61 - 1.24 mg/dL   Calcium 8.5 (L) 8.9 - 10.3 mg/dL   Total Protein 5.4 (L) 6.5 - 8.1 g/dL   Albumin 3.0 (L) 3.5 - 5.0 g/dL   AST 14 (L) 15 - 41 U/L   ALT 12 0 - 44 U/L   Alkaline Phosphatase 56 38 - 126 U/L   Total Bilirubin 1.9 (H) 0.0 - 1.2 mg/dL   GFR, Estimated 48 (L) >60 mL/min    Comment: (NOTE) Calculated using the CKD-EPI Creatinine Equation (2021)    Anion gap 10 5 - 15    Comment: Performed at [facility], [address], [place], [place] [address]  CBC     Status: Abnormal   Collection Time: 05/26/24  3:04 AM  Result Value Ref Range   WBC 7.9 4.0 - 10.5 K/uL   RBC 3.77 (L) 4.22 - 5.81 MIL/uL   Hemoglobin 11.7 (L) 13.0 - 17.0 g/dL   HCT 62.5 (L) 60.9 - 47.9 %   MCV 99.2 80.0 - 100.0 fL   MCH 31.0 26.0 - 34.0 pg   MCHC 31.3 30.0 - 36.0 g/dL   RDW 86.5 88.4 - 84.4 %   Platelets 132 (L) 150 - 400 K/uL    Comment: REPEATED TO VERIFY   nRBC 0.0 0.0 - 0.2 %    Comment: Performed at [facility], [address], [place], [place] [address]  Magnesium     Status: None   Collection  Time: 05/26/24  3:04 AM  Result Value Ref Range   Magnesium 2.0 1.7 - 2.4 mg/dL    Comment: Performed at [facility], [address] [place], [place] [address]  Basic metabolic panel     Status: Abnormal   Collection Time: 05/27/24  2:49 AM  Result Value Ref Range   Sodium 133 (L) 135 - 145 mmol/L   Potassium 4.2 3.5 - 5.1 mmol/L   Chloride 93 (L) 98 - 111 mmol/L   CO2 33 (H) 22 - 32 mmol/L   Glucose, Bld  109 (H) 70 - 99 mg/dL    Comment: Glucose reference range applies only to samples taken after fasting for at least 8 hours.   BUN 22 8 - 23 mg/dL   Creatinine, Ser 8.41 (H) 0.61 - 1.24 mg/dL   Calcium 8.0 (L) 8.9 - 10.3 mg/dL   GFR, Estimated 41 (L) >60 mL/min    Comment: (NOTE) Calculated using the CKD-EPI Creatinine Equation (2021)    Anion gap 7 5 - 15    Comment: Performed at [facility], [address] [place], [place] [address]  CBC     Status: Abnormal   Collection Time: 05/27/24  2:49 AM  Result Value Ref Range   WBC 8.6 4.0 - 10.5 K/uL   RBC 3.70 (L) 4.22 - 5.81 MIL/uL   Hemoglobin 11.6 (L) 13.0 - 17.0 g/dL   HCT 64.0 (L) 60.9 - 47.9 %   MCV 97.0 80.0 - 100.0 fL   MCH 31.4 26.0 - 34.0 pg   MCHC 32.3 30.0 - 36.0 g/dL   RDW 86.4 88.4 - 84.4 %   Platelets 118 (L) 150 - 400 K/uL    Comment: REPEATED TO VERIFY   nRBC 0.0 0.0 - 0.2 %    Comment: Performed at [facility], [address], [place], [place] [address]    ECG   N/A  Telemetry   Sinus rhythm with PAC's - Personally Reviewed  Radiology    ECHOCARDIOGRAM COMPLETE Result Date: 05/25/2024    ECHOCARDIOGRAM REPORT   Patient Name:   Scott [name] Date of Exam: 05/25/2024 Medical Rec #:  [id number]       Height:       65.0 in Accession #:    [id number]      Weight:       197.0 lb Date of Birth:  01-07-1928       BSA:          1.965 m Patient Age:    89 years        BP:           102/61 mmHg Patient Gender: M               HR:           68 bpm. Exam Location:  Inpatient Procedure: 2D Echo, Cardiac Doppler and Color Doppler (Both Spectral and Color            Flow Doppler were utilized during procedure). Indications:    Atrial Fibrillation I48.91, CHF I50.31  History:        Patient has prior history of Echocardiogram examinations, most                 recent 09/10/2019. CHF, CAD, Signs/Symptoms:Dyspnea and Shortness                 of Breath; Risk Factors:Hypertension and Sleep Apnea.  Sonographer:  Thea Norlander RCS Referring Phys: [id number] LOGAN N LOCKWOOD IMPRESSIONS  1. Left ventricular ejection fraction, by estimation, is 60 to 65%. The left ventricle has normal function. The left ventricle has no regional wall motion abnormalities. Left ventricular diastolic parameters are indeterminate.  2. Right ventricular systolic function is normal. The right ventricular size is normal.  3. Left atrial size was moderately dilated.  4. Right atrial size was moderately dilated.  5. The mitral valve is abnormal. Mild mitral valve regurgitation. No evidence of mitral stenosis.  6. The aortic valve is tricuspid. There is moderate calcification of the aortic valve. Aortic valve regurgitation is trivial. Aortic valve sclerosis is present, with no evidence of aortic valve stenosis.  7. There is moderate dilatation of the ascending aorta, measuring 44 mm.  8. The inferior vena cava is normal in size with greater than 50% respiratory variability, suggesting right atrial pressure of 3 mmHg. FINDINGS  Left Ventricle: Left ventricular ejection fraction, by estimation, is 60 to 65%. The left ventricle has normal function. The left ventricle has no regional wall motion abnormalities. Strain was performed and the global longitudinal strain is indeterminate. The left ventricular internal cavity size was normal in size. There is no left ventricular hypertrophy. Left ventricular diastolic parameters are indeterminate. Right Ventricle: The right ventricular size is normal. No increase in right ventricular wall thickness. Right ventricular systolic function is normal. Left Atrium: Left atrial size was moderately dilated. Right Atrium: Right atrial size was moderately dilated. Pericardium: There is no evidence of pericardial effusion. Mitral Valve: The mitral valve is abnormal. There is mild thickening of the mitral valve leaflet(s). There is mild calcification of the mitral valve leaflet(s). Mild mitral valve regurgitation. No evidence of  mitral valve stenosis. Tricuspid Valve: The tricuspid valve is normal in structure. Tricuspid valve regurgitation is mild . No evidence of tricuspid stenosis. Aortic Valve: The aortic valve is tricuspid. There is moderate calcification of the aortic valve. Aortic valve regurgitation is trivial. Aortic valve sclerosis is present, with no evidence of aortic valve stenosis. Aortic valve peak gradient measures 7.1  mmHg. Pulmonic Valve: The pulmonic valve was normal in structure. Pulmonic valve regurgitation is not visualized. No evidence of pulmonic stenosis. Aorta: The aortic root is normal in size and structure. There is moderate dilatation of the ascending aorta, measuring 44 mm. Venous: The inferior vena cava is normal in size with greater than 50% respiratory variability, suggesting right atrial pressure of 3 mmHg. IAS/Shunts: No atrial level shunt detected by color flow Doppler. Additional Comments: 3D was performed not requiring image post processing on an independent workstation and was indeterminate.  LEFT VENTRICLE PLAX 2D LVIDd:         4.60 cm   Diastology LVIDs:         2.80 cm   LV e' medial:    7.93 cm/s LV PW:         1.10 cm   LV E/e' medial:  18.9 LV IVS:        1.00 cm   LV e' lateral:   11.20 cm/s LVOT diam:     2.10 cm   LV E/e' lateral: 13.4 LV SV:         48 LV SV Index:   25 LVOT Area:     3.46 cm  RIGHT VENTRICLE            IVC RV S prime:     8.70 cm/s  IVC diam: 2.20 cm TAPSE (M-mode): 1.6  cm LEFT ATRIUM             Index        RIGHT ATRIUM           Index LA diam:        5.20 cm 2.65 cm/m   RA Area:     14.90 cm LA Vol (A2C):   57.2 ml 29.10 ml/m  RA Volume:   38.90 ml  19.79 ml/m LA Vol (A4C):   56.5 ml 28.75 ml/m LA Biplane Vol: 60.4 ml 30.73 ml/m  AORTIC VALVE AV Area (Vmax): 2.24 cm AV Vmax:        133.00 cm/s AV Peak Grad:   7.1 mmHg LVOT Vmax:      86.17 cm/s LVOT Vmean:     54.667 cm/s LVOT VTI:       0.140 m  AORTA Ao Root diam: 3.40 cm Ao Asc diam:  4.40 cm MITRAL VALVE                 TRICUSPID VALVE MV Area (PHT): 4.29 cm     TR Peak grad:   38.7 mmHg MV Decel Time: 177 msec     TR Vmax:        311.00 cm/s MV E velocity: 150.00 cm/s                             SHUNTS                             Systemic VTI:  0.14 m                             Systemic Diam: 2.10 cm Maude Emmer MD Electronically signed by Maude Emmer MD Signature Date/Time: 05/25/2024/4:47:35 PM    Final     Cardiac Studies   See echo above  Assessment   Principal Problem:   Acute on chronic diastolic CHF (congestive heart failure) (HCC) Active Problems:   Obstructive sleep apnea   Pure hypercholesterolemia   Obesity   Hypertension   GERD (gastroesophageal reflux disease)   CAD (coronary artery disease)   BPH (benign prostatic hyperplasia)   Anxiety   Anemia   Chronic diastolic CHF (congestive heart failure) (HCC)   Abnormal ECG   Syncope and collapse   Plan   Scott [name] is at end-diuresis based on exam and labs. Hold lasix  today, start oral lasix  20 mg daily tomorrow (could be at home). Will arrange follow-up with Dr. Swaziland or APP. Ok to d/c home from a cardiology standpoint.  Time Spent Directly with Patient:  I have spent a total of 25 minutes with the patient reviewing hospital notes, telemetry, EKGs, labs and examining the patient as well as establishing an assessment and plan that was discussed personally with the patient.  > 50% of time was spent in direct patient care.  Length of Stay:  LOS: 2 days   Scott KYM Maxcy, MD, [facility], FNLA, FACP  [place]  [facility]  Medical Director of the [facility] Lipid Disorders &  Cardiovascular Risk Reduction Clinic Diplomate of the American Board of Clinical Lipidology Attending Cardiologist  Direct Dial: [phone]  Fax: [phone]  Website:  [email or web address] Scott [name] 05/27/2024, 8:47 AM

## 2024-05-27 NOTE — Evaluation (Signed)
 Occupational Therapy Evaluation and Discharge Patient Details Name: Scott [name] MRN: [id number] DOB: 02/25/1928 Today's Date: 05/27/2024   History of Present Illness   Pt is a 89 y.o. M who presents 05/24/2024 after syncopal event following coughing spell in addition patient family reporting ongoing DOE and edema for several weeks. In the ED , heart rate 50s-70s troponin 38, 36, proBNP 2422. Imaging negative for traumatic injury to cervical or thoracic spine. Significant PMH: CAD/CABG, COPD, OSA, hypertension, dyslipidemia, GERD     Clinical Impressions At baseline, pt is largely Independent to Mod I with ADLs with use of AE/DME and some assistance for donning/doffing shoes. At baseline, pt performs functional mobility Mod I with a RW. Pt now reports he feels he is at or very close to functional baseline. Pt presents with mild noted balance deficits, but with no overt LOB this session. Pt currently demonstrates ability to complete ADLs Independent to Contact guard assist with use of AE/DME and functional mobility with a RW with Contact guard assist for safety. OT also recommends close Supervision to Contact guard assist for bathing and activities in standing for increased safety. VSS on RA throughout session. Pt reports no dizziness or lightheadedness this session. Pt participated well and has good family support. As pt is currently at or very near baseline functional level, no additional benefit from acute skilled OT services at this time. No post acute skilled OT needs are anticipated. OT is signing off at this time.      If plan is discharge home, recommend the following:   A little help with walking and/or transfers;A little help with bathing/dressing/bathroom;Assistance with cooking/housework;Assist for transportation;Help with stairs or ramp for entrance     Functional Status Assessment   Patient has had a recent decline in their functional status and demonstrates the ability to make  significant improvements in function in a reasonable and predictable amount of time.     Equipment Recommendations   None recommended by OT (Pt already has needed equipment)     Recommendations for Other Services         Precautions/Restrictions   Precautions Precautions: Fall Restrictions Weight Bearing Restrictions Per Provider Order: No     Mobility Bed Mobility               General bed mobility comments: Pt sitting in recliner at beginning and end of session    Transfers Overall transfer level: Needs assistance Equipment used: Rolling walker (2 wheels) Transfers: Sit to/from Stand, Bed to chair/wheelchair/BSC Sit to Stand: Contact guard assist, Min assist     Step pivot transfers: Contact guard assist     General transfer comment: CGA for safety      Balance Overall balance assessment: Mild deficits observed, not formally tested                                         ADL either performed or assessed with clinical judgement   ADL Overall ADL's : Needs assistance/impaired Eating/Feeding: Independent;Sitting   Grooming: Supervision/safety;Contact guard assist;Standing   Upper Body Bathing: Supervision/ safety;Sitting   Lower Body Bathing: Supervison/ safety;Contact guard assist;Sitting/lateral leans;Sit to/from stand;With adaptive equipment;Cueing for compensatory techniques   Upper Body Dressing : Modified independent;Sitting   Lower Body Dressing: Supervision/safety;Contact guard assist;Sitting/lateral leans;Sit to/from stand;With adaptive equipment;Cueing for compensatory techniques   Toilet Transfer: Contact guard assist;Ambulation;Regular Toilet;Rolling walker (2 wheels) Toilet Transfer Details (indicate  cue type and reason): simulated Toileting- Clothing Manipulation and Hygiene: Supervision/safety;Sit to/from stand;Sitting/lateral lean       Functional mobility during ADLs: Contact guard assist;Rolling walker (2  wheels) General ADL Comments: Pt reports he is feeling at or very near his baseline PLOF. OT educated pt in improved technique for use of AE for doffing/donning shoes and pt reported often needing assist at home due to back of shoe folding over when he puts his foot in. Pt otherwise demonstrates good understanding of use of AE for LB dressing.     Vision Baseline Vision/History: 1 Wears glasses Ability to See in Adequate Light: 0 Adequate (with glasses) Patient Visual Report: No change from baseline Vision Assessment?: No apparent visual deficits Additional Comments: Pt reports no visual changes after syncope with fall. No apprarent visual deficits this session. Not formally screened.     Perception         Praxis         Pertinent Vitals/Pain Pain Assessment Pain Assessment: No/denies pain Pain Intervention(s): Monitored during session     Extremity/Trunk Assessment Upper Extremity Assessment Upper Extremity Assessment: Overall WFL for tasks assessed   Lower Extremity Assessment Lower Extremity Assessment: Defer to PT evaluation (Gross B UE strength 4/5)   Cervical / Trunk Assessment Cervical / Trunk Assessment: Other exceptions (forward head posture)   Communication Communication Communication: No apparent difficulties   Cognition Arousal: Alert Behavior During Therapy: WFL for tasks assessed/performed Cognition: No apparent impairments             OT - Cognition Comments: Pt AAOx4 and pleasant throughout session Cognition WFl for tasks assessed. Cognition not formally screened or assessed.                 Following commands: Intact       Cueing  General Comments   Cueing Techniques: Verbal cues  VSS on RA. Pt reports no dizziness or lightheadedness.   Exercises     Shoulder Instructions      Home Living Family/patient expects to be discharged to:: Private residence Living Arrangements: Children (daughter) Available Help at Discharge:  Family;Available 24 hours/day (daughter; son also assists) Type of Home: House Home Access: Stairs to enter [employer] of Steps: 2 (2 through garage, 4 through front door) Entrance Stairs-Rails: None Home Layout: One level     Bathroom Shower/Tub: Producer, television/film/video: Standard     Home Equipment: Other (comment);Rolling Walker (2 wheels);Cane - single point;Grab bars - tub/shower;Adaptive equipment;Shower seat (3 wheeled walker) Adaptive Equipment: Reacher;Sock aid;Long-handled shoe horn;Other (Comment) (dressing stick)        Prior Functioning/Environment Prior Level of Function : Independent/Modified Independent;Driving             Mobility Comments: Mod I with a RW; pt reports syncope with fall leading to this admission was his only recent fall ADLs Comments: Largely Independent to Mod I with ADLs. Pt reports use of reacher, long handled shoe horn and dressing stick for LB dressing but says he still needs occasional assist due to back of shoe folding over.    OT Problem List:     OT Treatment/Interventions:        OT Goals(Current goals can be found in the care plan section)   Acute Rehab OT Goals Patient Stated Goal: to return home OT Goal Formulation: All assessment and education complete, DC therapy   OT Frequency:       Co-evaluation  AM-PAC OT 6 Clicks Daily Activity     Outcome Measure Help from another person eating meals?: None Help from another person taking care of personal grooming?: A Little Help from another person toileting, which includes using toliet, bedpan, or urinal?: A Little Help from another person bathing (including washing, rinsing, drying)?: A Little Help from another person to put on and taking off regular upper body clothing?: None Help from another person to put on and taking off regular lower body clothing?: A Little 6 Click Score: 20   End of Session Equipment Utilized During  Treatment: Rolling walker (2 wheels);Gait belt;Other (comment) (long handled shoe horn, reacher, sock aid, dressing stick) Nurse Communication: Mobility status  Activity Tolerance: Patient tolerated treatment well Patient left: in chair;with call bell/phone within reach;with chair alarm set  OT Visit Diagnosis: Other abnormalities of gait and mobility (R26.89)                Time: 9071-9057 OT Time Calculation (min): 14 min Charges:  OT General Charges $OT Visit: 1 Visit OT Evaluation $OT Eval Low Complexity: 1 Low  Margarie Rockey HERO., OTR/L, MA Acute Rehab [phone]  Margarie FORBES Horns 05/27/2024, 9:58 AM

## 2024-05-27 NOTE — Plan of Care (Signed)

## 2024-05-27 NOTE — Discharge Summary (Signed)
 Physician Discharge Summary  Scott [name] FMW:[id number] DOB: [birth year] DOA: 05/24/2024  PCP: [facility]  Admit date: 05/24/2024 Discharge date: 05/27/2024  Time spent: 45 minutes  Recommendations for Outpatient Follow-up:  PCP in 1 week, please check BMP at follow-up Instructed to take Lasix  20 Mg daily with additional dose if needed for weight gain Consider compression stockings   Discharge Diagnoses:  Principal Problem:   Acute on chronic diastolic CHF (congestive heart failure) (HCC) Active Problems:   Obstructive sleep apnea   Pure hypercholesterolemia   Obesity   Hypertension   GERD (gastroesophageal reflux disease)   CAD (coronary artery disease)   BPH (benign prostatic hyperplasia)   Anxiety   Anemia   Chronic diastolic CHF (congestive heart failure) (HCC)   Abnormal ECG   Syncope and collapse   Discharge Condition: Improved  Diet recommendation: Low sodium, heart healthy  Filed Weights   05/24/24 1654 05/26/24 0527 05/27/24 0626  Weight: 89.4 kg 86.1 kg 86.7 kg    History of present illness:  89/M with history of CAD/CABG, COPD, OSA, hypertension, dyslipidemia, GERD presented to the ED 6/20 after syncopal event following a coughing spell, in addition patient family reported ongoing dyspnea on exertion and edema for several weeks, takes Lasix  sporadically as he attributes it to his chronic constipation. - In the ED , heart rate 50s-70s troponin 38, 36, proBNP 2422  Hospital Course:   Acute on chronic diastolic CHF -Last echo 2020 with a EF 60-65%, impaired relaxation -Poor compliance with Lasix  at baseline, attributed it to constipation which we explained is highly unlikely -Repeat echo noted with EF  60-65, moderately dilated RA and LA - Improved with diuresis, around 4 L negative, swelling improved but not resolved, mild uptrend in creatinine noted, ARB discontinued -Followed by cardiology, recommended Lasix  20 Mg daily with  additional PRN dosing for weight gain - Follow-up with [facility]   A-fib ruled out -Per cards felt to be sinus with PACs as well as Mobitz   Hypertension - Lasix  as above - Discontinued HCTZ and telmisartan   Hyperlipidemia - Replace  lovastatin with formulary pravastatin    GERD - Continue home PPI   BPH - Continue tamsulosin    CAD - Continue home ASA, statin   Anxiety - Continue trazodone    OSA - Continue home CPAP   Obesity - Noted  Discharge Exam: Vitals:   05/27/24 0524 05/27/24 0726  BP: (!) 114/52 115/63  Pulse: 93 82  Resp: 14 20  Temp: 98.7 F (37.1 C) 98.3 F (36.8 C)  SpO2: 92% 90%   Gen: Awake, Alert, Oriented X 3,  HEENT: no JVD Lungs: Good air movement bilaterally, CTAB CVS: S1S2/RRR Abd: soft, Non tender, non distended, BS present Extremities: Trace edema Skin: no new rashes on exposed skin   Discharge Instructions   Discharge Instructions     Diet - low sodium heart healthy   Complete by: As directed    Increase activity slowly   Complete by: As directed       Allergies as of 05/27/2024       Reactions   Acetaminophen     Other reaction(s): mouth sores   Amlodipine Swelling   Swelling of feet and legs   Bactrim  [sulfamethoxazole -trimethoprim ] Other (See Comments)   Constipation    Codeine Nausea Only   Fexofenadine    Other reaction(s): mouth sores   Ibuprofen     Other reaction(s): nervousness   Lovenox [enoxaparin Sodium] Other (See Comments)  MAY HAVE CAUSED THROMBOCYTOPENIA   Naproxen    Other reaction(s): mouth sores   Prednisone Swelling   Patient has tolerated doses for colds. Thinks it caused swelling in the past   Shellfish Allergy Nausea And Vomiting, Other (See Comments)   N&V, shaking   Sonata  [zaleplon ] Other (See Comments)   Hyper feelings        Medication List     STOP taking these medications    hydrochlorothiazide  12.5 MG tablet Commonly known as: HYDRODIURIL    telmisartan 40 MG  tablet Commonly known as: MICARDIS       TAKE these medications    albuterol  108 (90 Base) MCG/ACT inhaler Commonly known as: VENTOLIN  HFA Inhale 1-2 puffs into the lungs every 6 (six) hours as needed for wheezing or shortness of breath.   aspirin  81 MG tablet Take 81 mg by mouth every evening.   Breo Ellipta  100-25 MCG/INH Aepb Generic drug: fluticasone  furoate-vilanterol Inhale 1 puff into the lungs daily.   budesonide-formoterol 160-4.5 MCG/ACT inhaler Commonly known as: SYMBICORT Inhale 2 puffs into the lungs 2 (two) times daily.   EPINEPHrine  0.3 mg/0.3 mL Soaj injection Commonly known as: EPI-PEN Inject 0.3 mg into the muscle as needed for anaphylaxis.   fish oil-omega-3 fatty acids 1000 MG capsule Take 1 g by mouth every other day.   Fluzone High-Dose Quadrivalent 0.7 ML Susy Generic drug: Influenza Vac High-Dose Quad Fluzone High-Dose Quad 2020-21 (PF) 240 mcg/0.7 mL IM syringe  PHARMACY ADMINISTERED   furosemide  20 MG tablet Commonly known as: LASIX  Take 1 tablet (20 mg total) by mouth daily. Take 20mg  daily and take an extra dose for weight gain of 3lb in 1 day or 5lb in 1 week What changed: See the new instructions.   lovastatin 20 MG tablet Commonly known as: MEVACOR Take 20 mg by mouth at bedtime.   meclizine  12.5 MG tablet Commonly known as: ANTIVERT  Take 1 tablet (12.5 mg total) by mouth 3 (three) times daily as needed for dizziness.   metoprolol  tartrate 50 MG tablet Commonly known as: LOPRESSOR  Take 25 mg by mouth 2 (two) times daily.   pantoprazole  20 MG tablet Commonly known as: Protonix  Take 1 tablet (20 mg total) by mouth daily.   polyethylene glycol 17 g packet Commonly known as: MIRALAX  / GLYCOLAX  Take 17 g by mouth daily.   potassium chloride  10 MEQ tablet Commonly known as: KLOR-CON  M Take 10 mEq by mouth every evening.   senna-docusate 8.6-50 MG tablet Commonly known as: Senokot-S Take 1 tablet by mouth 2 (two) times daily.    tamsulosin  0.4 MG Caps capsule Commonly known as: FLOMAX  Take 1 capsule (0.4 mg total) by mouth at bedtime.   temazepam  30 MG capsule Commonly known as: RESTORIL  Take 1 capsule by mouth at bedtime.   traZODone  50 MG tablet Commonly known as: DESYREL  Take 1 tablet (50 mg total) by mouth at bedtime. What changed:  how much to take when to take this   vitamin E  180 MG (400 UNITS) capsule Take 400 Units by mouth daily.       Allergies  Allergen Reactions   Acetaminophen      Other reaction(s): mouth sores   Amlodipine Swelling    Swelling of feet and legs   Bactrim  [Sulfamethoxazole -Trimethoprim ] Other (See Comments)    Constipation    Codeine Nausea Only   Fexofenadine     Other reaction(s): mouth sores   Ibuprofen      Other reaction(s): nervousness   Lovenox [Enoxaparin  Sodium] Other (See Comments)    MAY HAVE CAUSED THROMBOCYTOPENIA   Naproxen     Other reaction(s): mouth sores   Prednisone Swelling    Patient has tolerated doses for colds. Thinks it caused swelling in the past   Shellfish Allergy Nausea And Vomiting and Other (See Comments)    N&V, shaking   Sonata  [Zaleplon ] Other (See Comments)    Hyper feelings      The results of significant diagnostics from this hospitalization (including imaging, microbiology, ancillary and laboratory) are listed below for reference.    Significant Diagnostic Studies: ECHOCARDIOGRAM COMPLETE Result Date: 05/25/2024    ECHOCARDIOGRAM REPORT   Patient Name:   MIRO BALDERSON Date of Exam: 05/25/2024 Medical Rec #:  [id number]       Height:       65.0 in Accession #:    [id number]      Weight:       197.0 lb Date of Birth:  10/04/1928       BSA:          1.965 m Patient Age:    89 years        BP:           102/61 mmHg Patient Gender: M               HR:           68 bpm. Exam Location:  Inpatient Procedure: 2D Echo, Cardiac Doppler and Color Doppler (Both Spectral and Color            Flow Doppler were utilized during  procedure). Indications:    Atrial Fibrillation I48.91, CHF I50.31  History:        Patient has prior history of Echocardiogram examinations, most                 recent 09/10/2019. CHF, CAD, Signs/Symptoms:Dyspnea and Shortness                 of Breath; Risk Factors:Hypertension and Sleep Apnea.  Sonographer:    Thea Norlander RCS Referring Phys: [id number] LOGAN N LOCKWOOD IMPRESSIONS  1. Left ventricular ejection fraction, by estimation, is 60 to 65%. The left ventricle has normal function. The left ventricle has no regional wall motion abnormalities. Left ventricular diastolic parameters are indeterminate.  2. Right ventricular systolic function is normal. The right ventricular size is normal.  3. Left atrial size was moderately dilated.  4. Right atrial size was moderately dilated.  5. The mitral valve is abnormal. Mild mitral valve regurgitation. No evidence of mitral stenosis.  6. The aortic valve is tricuspid. There is moderate calcification of the aortic valve. Aortic valve regurgitation is trivial. Aortic valve sclerosis is present, with no evidence of aortic valve stenosis.  7. There is moderate dilatation of the ascending aorta, measuring 44 mm.  8. The inferior vena cava is normal in size with greater than 50% respiratory variability, suggesting right atrial pressure of 3 mmHg. FINDINGS  Left Ventricle: Left ventricular ejection fraction, by estimation, is 60 to 65%. The left ventricle has normal function. The left ventricle has no regional wall motion abnormalities. Strain was performed and the global longitudinal strain is indeterminate. The left ventricular internal cavity size was normal in size. There is no left ventricular hypertrophy. Left ventricular diastolic parameters are indeterminate. Right Ventricle: The right ventricular size is normal. No increase in right ventricular wall thickness. Right ventricular systolic function is normal. Left Atrium: Left atrial size was moderately dilated.  Right  Atrium: Right atrial size was moderately dilated. Pericardium: There is no evidence of pericardial effusion. Mitral Valve: The mitral valve is abnormal. There is mild thickening of the mitral valve leaflet(s). There is mild calcification of the mitral valve leaflet(s). Mild mitral valve regurgitation. No evidence of mitral valve stenosis. Tricuspid Valve: The tricuspid valve is normal in structure. Tricuspid valve regurgitation is mild . No evidence of tricuspid stenosis. Aortic Valve: The aortic valve is tricuspid. There is moderate calcification of the aortic valve. Aortic valve regurgitation is trivial. Aortic valve sclerosis is present, with no evidence of aortic valve stenosis. Aortic valve peak gradient measures 7.1  mmHg. Pulmonic Valve: The pulmonic valve was normal in structure. Pulmonic valve regurgitation is not visualized. No evidence of pulmonic stenosis. Aorta: The aortic root is normal in size and structure. There is moderate dilatation of the ascending aorta, measuring 44 mm. Venous: The inferior vena cava is normal in size with greater than 50% respiratory variability, suggesting right atrial pressure of 3 mmHg. IAS/Shunts: No atrial level shunt detected by color flow Doppler. Additional Comments: 3D was performed not requiring image post processing on an independent workstation and was indeterminate.  LEFT VENTRICLE PLAX 2D LVIDd:         4.60 cm   Diastology LVIDs:         2.80 cm   LV e' medial:    7.93 cm/s LV PW:         1.10 cm   LV E/e' medial:  18.9 LV IVS:        1.00 cm   LV e' lateral:   11.20 cm/s LVOT diam:     2.10 cm   LV E/e' lateral: 13.4 LV SV:         48 LV SV Index:   25 LVOT Area:     3.46 cm  RIGHT VENTRICLE            IVC RV S prime:     8.70 cm/s  IVC diam: 2.20 cm TAPSE (M-mode): 1.6 cm LEFT ATRIUM             Index        RIGHT ATRIUM           Index LA diam:        5.20 cm 2.65 cm/m   RA Area:     14.90 cm LA Vol (A2C):   57.2 ml 29.10 ml/m  RA Volume:   38.90 ml   19.79 ml/m LA Vol (A4C):   56.5 ml 28.75 ml/m LA Biplane Vol: 60.4 ml 30.73 ml/m  AORTIC VALVE AV Area (Vmax): 2.24 cm AV Vmax:        133.00 cm/s AV Peak Grad:   7.1 mmHg LVOT Vmax:      86.17 cm/s LVOT Vmean:     54.667 cm/s LVOT VTI:       0.140 m  AORTA Ao Root diam: 3.40 cm Ao Asc diam:  4.40 cm MITRAL VALVE                TRICUSPID VALVE MV Area (PHT): 4.29 cm     TR Peak grad:   38.7 mmHg MV Decel Time: 177 msec     TR Vmax:        311.00 cm/s MV E velocity: 150.00 cm/s  SHUNTS                             Systemic VTI:  0.14 m                             Systemic Diam: 2.10 cm Maude Emmer MD Electronically signed by Maude Emmer MD Signature Date/Time: 05/25/2024/4:47:35 PM    Final    CT T-SPINE NO CHARGE Result Date: 05/24/2024 CLINICAL DATA:  Back pain after fall EXAM: CT Thoracic Spine without contrast TECHNIQUE: Multiplanar CT images of the thoracic spine were reconstructed from contemporary CT of the Chest. RADIATION DOSE REDUCTION: This exam was performed according to the departmental dose-optimization program which includes automated exposure control, adjustment of the mA and/or kV according to patient size and/or use of iterative reconstruction technique. CONTRAST:  None or No additional COMPARISON:  None Available. FINDINGS: Alignment: Mild S shaped scoliosis of the spine. Sagittal alignment is within normal limits. Vertebrae: Vertebral body heights are maintained. No definitive fracture is seen. Paraspinal and other soft tissues: No definite acute paravertebral or paraspinal soft tissue abnormality. Disc levels: Advanced multilevel degenerative changes with multilevel degenerative osteophytes and multilevel disc space narrowing. Increased disc height at T5-T6 compared with several levels above and below. IMPRESSION: 1. Advanced multilevel degenerative changes. Vertebral body heights are maintained and no definitive fracture lucency is seen. 2. Increased disc  height at T5-T6 compared with several adjacent levels above and below, with overall rigid appearance of spine due to advanced degenerative changes and multilevel osteophytes. Correlate for focal symptoms to the region, MRI could be obtained if presentation suggests potential ligamentous injury. Electronically Signed   By: Luke Bun M.D.   On: 05/24/2024 20:21   CT CHEST ABDOMEN PELVIS W CONTRAST Result Date: 05/24/2024 CLINICAL DATA:  Clemens at home syncope EXAM: CT CHEST, ABDOMEN, AND PELVIS WITH CONTRAST TECHNIQUE: Multidetector CT imaging of the chest, abdomen and pelvis was performed following the standard protocol during bolus administration of intravenous contrast. RADIATION DOSE REDUCTION: This exam was performed according to the departmental dose-optimization program which includes automated exposure control, adjustment of the mA and/or kV according to patient size and/or use of iterative reconstruction technique. CONTRAST:  OMNIPAQUE  IOHEXOL  300 MG/ML  SOLN COMPARISON:  CT 04/16/2019 FINDINGS: CT CHEST FINDINGS Cardiovascular: Moderate aortic atherosclerosis. Post CABG changes. Ascending aortic diameter up to 4.1 cm. Coronary vascular calcification. Mild cardiomegaly. No pericardial effusion Mediastinum/Nodes: Patent trachea. No thyroid  mass. No suspicious lymph nodes. Esophagus within normal limits. Lungs/Pleura: No acute airspace disease, pleural effusion, or pneumothorax. Bleb at the medial right base. Musculoskeletal: Sternotomy.  See separately dictated spine CT CT ABDOMEN PELVIS FINDINGS Hepatobiliary: No focal liver abnormality is seen. No gallstones, gallbladder wall thickening, or biliary dilatation. Pancreas: Unremarkable. No pancreatic ductal dilatation or surrounding inflammatory changes. Spleen: Inhomogeneous enhancement probably due to phase of enhancement. Adrenals/Urinary Tract: Adrenal glands are normal. The kidneys show no hydronephrosis. Renal cysts for which no imaging  follow-up is recommended. The bladder is normal Stomach/Bowel: Stomach nonenlarged. No dilated small bowel. No acute bowel wall thickening. Mild diverticular disease of the colon Vascular/Lymphatic: Aortic atherosclerosis. No enlarged abdominal or pelvic lymph nodes. Reproductive: Markedly enlarged prostate Other: Negative for pelvic effusion or free air. Small fat containing right inguinal hernia. Small fat containing umbilical hernia Musculoskeletal: Right hip replacement. Multilevel degenerative changes. No acute osseous abnormality. IMPRESSION: 1. Negative for acute intrathoracic,  abdominal, or pelvic abnormality. 2. Mild diverticular disease of the colon without acute inflammatory process. 3. Markedly enlarged prostate. 4. Aortic atherosclerosis. Ascending aortic diameter up to 4.1 cm. Recommend annual imaging followup by CTA or MRA. This recommendation follows 2010 ACCF/AHA/AATS/ACR/ASA/SCA/SCAI/SIR/STS/SVM Guidelines for the Diagnosis and Management of Patients with Thoracic Aortic Disease. Circulation. 2010; 121: Z733-z630. Aortic aneurysm NOS (ICD10-I71.9) Aortic Atherosclerosis (ICD10-I70.0). Electronically Signed   By: Luke Bun M.D.   On: 05/24/2024 20:21   CT Head Wo Contrast Result Date: 05/24/2024 EXAM: CT HEAD AND CERVICAL SPINE 05/24/2024 07:11:30 PM TECHNIQUE: CT of the head and cervical spine was performed without the administration of intravenous contrast. Multiplanar reformatted images are provided for review. Automated exposure control, iterative reconstruction, and/or weight based adjustment of the mA/kV was utilized to reduce the radiation dose to as low as reasonably achievable. COMPARISON: None available. CLINICAL HISTORY: 89 y/o male. Patient suffered fall after syncopal episode today. Hit back of head. FINDINGS: CT HEAD BRAIN AND VENTRICLES: No acute intracranial hemorrhage. No mass effect or midline shift. No abnormal extra-axial fluid collection. Gray-white differentiation is  maintained. No hydrocephalus. Global cortical atrophy. Subcortical and periventricular small vessel ischemic changes. Intracranial atherosclerosis. ORBITS: No acute abnormality. SINUSES AND MASTOIDS: No acute abnormality. SOFT TISSUES AND SKULL: No acute skull fracture. Mild soft tissue swelling overlying the right posterior vertex (sagittal image 133). CT CERVICAL SPINE BONES AND ALIGNMENT: No acute fracture or traumatic malalignment. DEGENERATIVE CHANGES: Moderate degenerative changes of the cervical spine with narrowing of the spinal canal, most prominent at C3-4. SOFT TISSUES: No prevertebral soft tissue swelling. IMPRESSION: 1. Mild soft tissue swelling overlying the right posterior vertex. No acute intracranial abnormality. Atrophy with small vessel ischemic changes. 2. No traumatic injury to the cervical spine. Moderate degenerative changes, most prominent at C3-4. Electronically signed by: Pinkie Pebbles MD 05/24/2024 07:38 PM EDT RP Workstation: [id number]   CT Cervical Spine Wo Contrast Result Date: 05/24/2024 EXAM: CT HEAD AND CERVICAL SPINE 05/24/2024 07:11:30 PM TECHNIQUE: CT of the head and cervical spine was performed without the administration of intravenous contrast. Multiplanar reformatted images are provided for review. Automated exposure control, iterative reconstruction, and/or weight based adjustment of the mA/kV was utilized to reduce the radiation dose to as low as reasonably achievable. COMPARISON: None available. CLINICAL HISTORY: 89 y/o male. Patient suffered fall after syncopal episode today. Hit back of head. FINDINGS: CT HEAD BRAIN AND VENTRICLES: No acute intracranial hemorrhage. No mass effect or midline shift. No abnormal extra-axial fluid collection. Gray-white differentiation is maintained. No hydrocephalus. Global cortical atrophy. Subcortical and periventricular small vessel ischemic changes. Intracranial atherosclerosis. ORBITS: No acute abnormality. SINUSES AND MASTOIDS:  No acute abnormality. SOFT TISSUES AND SKULL: No acute skull fracture. Mild soft tissue swelling overlying the right posterior vertex (sagittal image 133). CT CERVICAL SPINE BONES AND ALIGNMENT: No acute fracture or traumatic malalignment. DEGENERATIVE CHANGES: Moderate degenerative changes of the cervical spine with narrowing of the spinal canal, most prominent at C3-4. SOFT TISSUES: No prevertebral soft tissue swelling. IMPRESSION: 1. Mild soft tissue swelling overlying the right posterior vertex. No acute intracranial abnormality. Atrophy with small vessel ischemic changes. 2. No traumatic injury to the cervical spine. Moderate degenerative changes, most prominent at C3-4. Electronically signed by: Pinkie Pebbles MD 05/24/2024 07:38 PM EDT RP Workstation: [id number]    Microbiology: No results found for this or any previous visit (from the past 240 hours).   Labs: Basic Metabolic Panel: Recent Labs  Lab 05/24/24 1700 05/25/24 1601 05/26/24 0304 05/27/24 0249  NA 137 137 135 133*  K 3.9 3.9 4.0 4.2  CL 95* 91* 93* 93*  CO2 31 37* 32 33*  GLUCOSE 166* 114* 115* 109*  BUN 16 13 16 22   CREATININE 1.21 1.17 1.38* 1.58*  CALCIUM 8.8* 9.1 8.5* 8.0*  MG  --  2.1 2.0  --    Liver Function Tests: Recent Labs  Lab 05/25/24 1601 05/26/24 0304  AST 20 14*  ALT 13 12  ALKPHOS 69 56  BILITOT 1.6* 1.9*  PROT 6.4* 5.4*  ALBUMIN 3.5 3.0*   No results for input(s): LIPASE, AMYLASE in the last 168 hours. No results for input(s): AMMONIA in the last 168 hours. CBC: Recent Labs  Lab 05/24/24 1700 05/25/24 1601 05/26/24 0304 05/27/24 0249  WBC 9.6 7.4 7.9 8.6  HGB 12.7* 13.3 11.7* 11.6*  HCT 40.1 42.8 37.4* 35.9*  MCV 100.3* 99.1 99.2 97.0  PLT 154 142* 132* 118*   Cardiac Enzymes: No results for input(s): CKTOTAL, CKMB, CKMBINDEX, TROPONINI in the last 168 hours. BNP: BNP (last 3 results) No results for input(s): BNP in the last 8760 hours.  ProBNP (last 3  results) Recent Labs    05/24/24 1702  PROBNP 2,422.0*    CBG: No results for input(s): GLUCAP in the last 168 hours.     Signed:  Sigurd Pac MD.  [facility] 05/27/2024, 10:46 AM

## 2024-06-29 ENCOUNTER — Telehealth: Payer: Self-pay | Admitting: Cardiology

## 2024-06-29 MED ORDER — FUROSEMIDE 20 MG PO TABS: 20.0000 mg | ORAL_TABLET | Freq: Every day | ORAL | 1 refills | Status: DC

## 2024-06-29 NOTE — Telephone Encounter (Signed)
 Refills has been sent to the pharmacy.

## 2024-06-29 NOTE — Telephone Encounter (Signed)
*  STAT* If patient is at the pharmacy, call can be transferred to refill team.   1. Which medications need to be refilled? (please list name of each medication and dose if known)   furosemide  (LASIX ) 20 MG tablet    2. Which pharmacy/location (including street and city if local pharmacy) is medication to be sent to?  [id number] - [place], [place] - [address]      3. Do they need a 30 day or 90 day supply? 90 day    Pt is out of medication

## 2024-07-05 DIAGNOSIS — I10 Essential (primary) hypertension: Secondary | ICD-10-CM | POA: Diagnosis not present

## 2024-07-05 DIAGNOSIS — E7800 Pure hypercholesterolemia, unspecified: Secondary | ICD-10-CM | POA: Diagnosis not present

## 2024-07-05 DIAGNOSIS — I2510 Atherosclerotic heart disease of native coronary artery without angina pectoris: Secondary | ICD-10-CM | POA: Diagnosis not present

## 2024-07-05 DIAGNOSIS — J4520 Mild intermittent asthma, uncomplicated: Secondary | ICD-10-CM | POA: Diagnosis not present

## 2024-07-17 ENCOUNTER — Encounter: Payer: Self-pay | Admitting: Cardiology

## 2024-07-17 ENCOUNTER — Ambulatory Visit

## 2024-07-17 ENCOUNTER — Ambulatory Visit: Attending: Cardiology | Admitting: Cardiology

## 2024-07-17 VITALS — BP 148/88 | HR 80 | Temp 98.0°F | Ht 65.0 in | Wt 191.6 lb

## 2024-07-17 VITALS — BP 148/88 | HR 80 | Ht 65.0 in | Wt 191.6 lb

## 2024-07-17 DIAGNOSIS — I5032 Chronic diastolic (congestive) heart failure: Secondary | ICD-10-CM

## 2024-07-17 DIAGNOSIS — R55 Syncope and collapse: Secondary | ICD-10-CM

## 2024-07-17 DIAGNOSIS — I25810 Atherosclerosis of coronary artery bypass graft(s) without angina pectoris: Secondary | ICD-10-CM

## 2024-07-17 DIAGNOSIS — I10 Essential (primary) hypertension: Secondary | ICD-10-CM

## 2024-07-17 NOTE — Progress Notes (Unsigned)
 Enrolled patient for a 14 day Zio XT  monitor to be mailed to patients home

## 2024-07-17 NOTE — Patient Instructions (Addendum)
 Medication Instructions:  Continue same medications *If you need a refill on your cardiac medications before your next appointment, please call your pharmacy*  Lab Work: Bmet today  Testing/Procedures: 2 week Heart Monitor   Follow-Up: At [facility], you and your health needs are our priority.  As part of our continuing mission to provide you with exceptional heart care, our providers are all part of one team.  This team includes your primary Cardiologist (physician) and Advanced Practice Providers or APPs (Physician Assistants and Nurse Practitioners) who all work together to provide you with the care you need, when you need it.  Your next appointment:  3 months   Wed 11/12 at 1:30 pm    Provider:  Damien Braver NP    We recommend signing up for the patient portal called MyChart.  Sign up information is provided on this After Visit Summary.  MyChart is used to connect with patients for Virtual Visits (Telemedicine).  Patients are able to view lab/test results, encounter notes, upcoming appointments, etc.  Non-urgent messages can be sent to your provider as well.   To learn more about what you can do with MyChart, go to [email or web address].

## 2024-07-17 NOTE — Progress Notes (Signed)
 CARDIOLOGY OFFICE NOTE  Date:  07/17/2024    [name] Date of Birth: [birth year] Medical Record #[id number]  PCP:  Nanci Senior, MD  Cardiologist:  Bhavika Schnider [place] MD  No chief complaint on file.   History of Present Illness: Scott [name] is a 89 y.o. male who presents today for follow up CAD and edema.  He has known CAD with remote CABG back in 2007 by  Dr. Army. Negative stress Myoview  in 2010. Other issues include  HTN, HLD and OSA. Last echo in 2020.  In 2020 evaluation for edema with lab work including Chemistries, TSH, CBC,UA,  and BNP which were all normal except chronically low platelets. LE dopplers were negative for DVT. Echo was fairly unremarkable with gr 2 diastolic dysfunction and mild pulmonary HTN. EF was normal.   Followed by Dr Neysa for OSA with CPAP.  He presented to the ED 6/20 after syncopal event following a coughing spell, in addition patient family reported ongoing dyspnea on exertion and edema for several weeks, takes Lasix  sporadically as he attributes it to his chronic constipation. He was diuresed 4 liters. DC on lasix  20 mg daily. He had frequent PACs but no Afib. Echo showed normal LV function. His telmisartan  and chlorthalidone were held due to rising creatinine with diuresis.   On follow up today he is ok. Seen with his daughter. Notes some chronic SOB. States weight at home is stable. BP 140-150 systolic.  No chest pain. No recurrent dizziness or syncope.  He is taking lasix  daily  Past Medical History:  Diagnosis Date   Acute bronchitis 07/11/2012   Acute pancreatitis 04/17/2019   Anemia    Acute blood loss anemia secondary to surgery, followed fr/ Hip replacement    Anxiety    pt. reports that he has occas. nervous episodes    BPH (benign prostatic hyperplasia)    CAD (coronary artery disease)    s/p CABG in 2007  CARDILOLOGIST IS DR. P. [place]   Cancer (HCC)    melanoma - removed L ear - 2010   Cranial neuropathy     resolved    Generalized OA    GERD (gastroesophageal reflux disease)    H/O echocardiogram    last echo, stress test 05/2012- pt. followed by [facility]    Hypercholesterolemia    Hypertension    Obesity    Osteoarthritis    End-stage osteoarthritis, right hip   Sleep apnea    with use of CPAP   SOB (shortness of breath)    WITH EXERTION   Thrombocytopenia (HCC)    possibly due to Lovenox    Past Surgical History:  Procedure Laterality Date   ACHILLES TENDON SURGERY  10/06/2012   Procedure: ACHILLES TENDON REPAIR;  Surgeon: LELON JONETTA Shari Mickey., MD;  Location: [facility] OR;  Service: Orthopedics;  Laterality: Right;  REPAIR RIGHT RUPTURE ACHILLES TENDON PRIMARY OPEN/PERCUTANEOUS, EXCISION PARTIAL BONE TALUS/CANCANEUS, REPAIR PARTIAL EXCISION CALCANEUS   APPENDECTOMY     ARTERY BIOPSY     temporal - L side- wnl, double vision treated /w prednisone - 1991   CALCANEAL OSTEOTOMY  10/06/2012   Procedure: CALCANEAL OSTEOTOMY;  Surgeon: LELON JONETTA Shari Mickey., MD;  Location: [facility] OR;  Service: Orthopedics;  Laterality: Right;   CARDIAC CATHETERIZATION  12/15/2005   NORMAL. EF 60%, angioplasty 1995   carpel tunnel surgery-bilateral     both hands    CORONARY ARTERY BYPASS GRAFT  2007   LIMA GRAFT TO THE LAD, SAPHENOUS VEIN  GRAFT TO THE FIRST OBTUSE MARIGNAL, SAPHENOUS VEIN GRAFT TO THE SECOND OBTUSE MARGINAL AND A SAPHENOUS VEIN GRAFT TO THE ACUTE MARGINAL AND POSTERIOR LATERAL BRANCH   HERNIA REPAIR     I & D EXTREMITY  12/11/2012   Procedure: IRRIGATION AND DEBRIDEMENT EXTREMITY;  Surgeon: Jerona LULLA Sage, MD;  Location: [facility] OR;  Service: Orthopedics;  Laterality: Right;  Irrigation and Debridement achilles, wound closure, apply wound vac, place antibiotic beads   INGUINAL HERNIA REPAIR Left 05/10/2013   Procedure: HERNIA REPAIR INGUINAL ADULT;  Surgeon: Krystal CHRISTELLA Spinner, MD;  Location: [facility];  Service: General;  Laterality: Left;   INSERTION OF MESH Left 05/10/2013   Procedure: INSERTION OF MESH;  Surgeon: Krystal CHRISTELLA Spinner,  MD;  Location: [facility];  Service: General;  Laterality: Left;   JOINT REPLACEMENT     knee  11/12   left arthroscopic   TOTAL HIP ARTHROPLASTY     right     Medications: Current Outpatient Medications  Medication Sig Dispense Refill   albuterol  (VENTOLIN  HFA) 108 (90 Base) MCG/ACT inhaler Inhale 1-2 puffs into the lungs every 6 (six) hours as needed for wheezing or shortness of breath. 1 each 0   aspirin  81 MG tablet Take 81 mg by mouth every evening.     BREO ELLIPTA  100-25 MCG/INH AEPB Inhale 1 puff into the lungs daily.     budesonide-formoterol (SYMBICORT) 160-4.5 MCG/ACT inhaler Inhale 2 puffs into the lungs 2 (two) times daily.     EPINEPHrine  0.3 mg/0.3 mL IJ SOAJ injection Inject 0.3 mg into the muscle as needed for anaphylaxis. 1 each 0   fish oil-omega-3 fatty acids 1000 MG capsule Take 1 g by mouth every other day.     furosemide  (LASIX ) 20 MG tablet Take 1 tablet (20 mg total) by mouth daily. Take 20mg  daily and take an extra dose for weight gain of 3lb in 1 day or 5lb in 1 week 180 tablet 1   lovastatin (MEVACOR) 20 MG tablet Take 20 mg by mouth at bedtime.     meclizine  (ANTIVERT ) 12.5 MG tablet Take 1 tablet (12.5 mg total) by mouth 3 (three) times daily as needed for dizziness. 15 tablet 0   metoprolol  (LOPRESSOR ) 50 MG tablet Take 25 mg by mouth 2 (two) times daily.     pantoprazole  (PROTONIX ) 20 MG tablet Take 1 tablet (20 mg total) by mouth daily. 30 tablet 0   polyethylene glycol (MIRALAX  / GLYCOLAX ) 17 g packet Take 17 g by mouth daily.     potassium chloride  (K-DUR,KLOR-CON ) 10 MEQ tablet Take 10 mEq by mouth every evening.      senna-docusate (SENOKOT-S) 8.6-50 MG tablet Take 1 tablet by mouth 2 (two) times daily. 60 tablet 0   tamsulosin  (FLOMAX ) 0.4 MG CAPS capsule Take 1 capsule (0.4 mg total) by mouth at bedtime. 30 capsule 5   temazepam  (RESTORIL ) 30 MG capsule Take 1 capsule by mouth at bedtime.     traZODone  (DESYREL ) 50 MG tablet Take 1 tablet (50 mg total)  by mouth at bedtime. 30 tablet prn   vitamin E  400 UNIT capsule Take 400 Units by mouth daily.      Influenza Vac High-Dose Quad (FLUZONE HIGH-DOSE QUADRIVALENT) 0.7 ML SUSY Fluzone High-Dose Quad 2020-21 (PF) 240 mcg/0.7 mL IM syringe  PHARMACY ADMINISTERED (Patient not taking: Reported on 07/17/2024)     No current facility-administered medications for this visit.    Allergies: Allergies  Allergen Reactions   Acetaminophen   Other reaction(s): mouth sores   Amlodipine Swelling    Swelling of feet and legs   Bactrim  [Sulfamethoxazole -Trimethoprim ] Other (See Comments)    Constipation    Codeine Nausea Only   Fexofenadine     Other reaction(s): mouth sores   Ibuprofen      Other reaction(s): nervousness   Lovenox [Enoxaparin Sodium] Other (See Comments)    MAY HAVE CAUSED THROMBOCYTOPENIA   Naproxen     Other reaction(s): mouth sores   Prednisone Swelling    Patient has tolerated doses for colds. Thinks it caused swelling in the past   Shellfish Allergy Nausea And Vomiting and Other (See Comments)    N&V, shaking   Sonata  [Zaleplon ] Other (See Comments)    Hyper feelings    Social History: The patient  reports that he has never smoked. He has never used smokeless tobacco. He reports that he does not drink alcohol and does not use drugs.   Family History: The patient's family history includes Heart disease in his brother and brother; Prostate cancer in his brother.   Review of Systems: Please see the history of present illness.     All other systems are reviewed and negative.   Physical Exam: VS:  BP (!) 148/88 (BP Location: Right Arm, Patient Position: Sitting, Cuff Size: Normal)   Pulse 80   Ht 5' 5 (1.651 m)   Wt 191 lb 9.6 oz (86.9 kg)   SpO2 92%   BMI 31.88 kg/m  .  BMI Body mass index is 31.88 kg/m.  Wt Readings from Last 3 Encounters:  07/17/24 191 lb 9.6 oz (86.9 kg)  05/27/24 191 lb 2.2 oz (86.7 kg)  05/04/24 197 lb 12.8 oz (89.7 kg)   GENERAL:   Elderly  WM in NAD HEENT:  PERRL, EOMI, sclera are clear. Oropharynx is clear. NECK:  No jugular venous distention, carotid upstroke brisk and symmetric, no bruits, no thyromegaly or adenopathy LUNGS:  diminished BS in bases. No rales.  CHEST:  Unremarkable HEART:  RRR,  PMI not displaced or sustained,S1 and S2 within normal limits, no S3, no S4: no clicks, no rubs, no murmurs ABD:  Soft, nontender. BS +, no masses or bruits. No hepatomegaly, no splenomegaly EXT:  2 + pulses throughout, 1+  pretibial on left.  Edema with stasis dermatitis.  no cyanosis no clubbing SKIN:  Warm and dry.  No rashes NEURO:  Alert and oriented x 3. Cranial nerves II through XII intact. PSYCH:  Cognitively intact   LABORATORY DATA:       Lab Results  Component Value Date   WBC 8.6 05/27/2024   HGB 11.6 (L) 05/27/2024   HCT 35.9 (L) 05/27/2024   PLT 118 (L) 05/27/2024   GLUCOSE 109 (H) 05/27/2024   CHOL 152 05/17/2011   TRIG 56 04/17/2019   HDL 40.30 05/17/2011   LDLCALC 82 05/17/2011   ALT 12 05/26/2024   AST 14 (L) 05/26/2024   NA 133 (L) 05/27/2024   K 4.2 05/27/2024   CL 93 (L) 05/27/2024   CREATININE 1.58 (H) 05/27/2024   BUN 22 05/27/2024   CO2 33 (H) 05/27/2024   TSH 2.185 05/25/2024   INR 1.0 05/24/2024    BNP (last 3 results) No results for input(s): BNP in the last 8760 hours.  ProBNP (last 3 results) Recent Labs    05/24/24 1702  PROBNP 2,422.0*     Other Studies Reviewed Today:  Labs from primary care: 01/07/16: cholesterol 141, triglycerides 144, LDL 65, HDL  47.  09/10/16: BMET normal. Dated 11/16/17: cholesterol 161, triglycerides 134, HDL 51, LDL 84. BMET normal Dated 11/21/18: cholesterol 160, triglycerides 182, HDL 54, LDL 70. Potassium 3.3. creatinine normal. Dated 12/13/19: cholesterol 142, triglycerides 159, HDL 49, LDL 66. Dated 01/16/20: normal CBC and CMET Dated 12/10/20: normal Hgb and BMET  Echo 09/10/19: IMPRESSIONS      1. Left ventricular ejection  fraction, by visual estimation, is 60 to 65%. The left ventricle has normal function. Normal left ventricular size. There is mildly increased left ventricular hypertrophy.  2. Left ventricular diastolic Doppler parameters are consistent with impaired relaxation pattern of LV diastolic filling.  3. Global right ventricle has normal systolic function.The right ventricular size is mildly enlarged. No increase in right ventricular wall thickness.  4. Left atrial size was normal.  5. Right atrial size was normal.  6. The mitral valve is normal in structure. Mild mitral valve regurgitation. No evidence of mitral stenosis.  7. The tricuspid valve is normal in structure. Tricuspid valve regurgitation is trivial.  8. The aortic valve is normal in structure. Aortic valve regurgitation was not visualized by color flow Doppler. Structurally normal aortic valve, with no evidence of sclerosis or stenosis.  9. The pulmonic valve was normal in structure. Pulmonic valve regurgitation is trivial by color flow Doppler. 10. There is mild dilatation of the aortic root measuring 42 mm. 11. Mildly elevated pulmonary artery systolic pressure. 12. The tricuspid regurgitant velocity is 2.71 m/s, and with an assumed right atrial pressure of 8 mmHg, the estimated right ventricular systolic pressure is mildly elevated at 37.4 mmHg. 13. The inferior vena cava is normal in size with greater than 50% respiratory variability, suggesting right atrial pressure of 3 mmHg.   In comparison to the previous echocardiogram(s): 05/23/12 EF 55-65%. PA pressure .   Echo 05/25/24: IMPRESSIONS     1. Left ventricular ejection fraction, by estimation, is 60 to 65%. The  left ventricle has normal function. The left ventricle has no regional  wall motion abnormalities. Left ventricular diastolic parameters are  indeterminate.   2. Right ventricular systolic function is normal. The right ventricular  size is normal.   3. Left atrial  size was moderately dilated.   4. Right atrial size was moderately dilated.   5. The mitral valve is abnormal. Mild mitral valve regurgitation. No  evidence of mitral stenosis.   6. The aortic valve is tricuspid. There is moderate calcification of the  aortic valve. Aortic valve regurgitation is trivial. Aortic valve  sclerosis is present, with no evidence of aortic valve stenosis.   7. There is moderate dilatation of the ascending aorta, measuring 44 mm.   8. The inferior vena cava is normal in size with greater than 50%  respiratory variability, suggesting right atrial pressure of 3 mmHg.   Assessment/Plan: 1.  CAD with prior CABG back in 2007 - he remains asymptomatic. Continue medical therapy with ASA, beta blocker and statin.  2. HLD - on statin therapy. Labs with PCP  3. HTN BP elevated. Will recheck BMET. If renal function improved I would like to resume ARB.    4. Chronic diastolic CHF. Encourage him to take lasix  daily. Restrict salt intake. We considered SGLT 2 inhibitor but this is too expensive. Recent echo stable  5. Syncope. Etiology unknown. Will order 2 week event monitor.    Disposition:   Follow up with APP in 3 months.  Signed: Syris Brookens [place] MD, [facility]  07/17/2024 3:18 PM    [facility]

## 2024-07-17 NOTE — Addendum Note (Signed)
 Addended by: CHRISTIANNE CHANNING PARAS on: 07/17/2024 03:34 PM   Modules accepted: Orders

## 2024-07-18 ENCOUNTER — Other Ambulatory Visit: Payer: Self-pay

## 2024-07-18 ENCOUNTER — Ambulatory Visit: Payer: Self-pay | Admitting: Cardiology

## 2024-07-18 LAB — BASIC METABOLIC PANEL WITH GFR
BUN/Creatinine Ratio: 13 (ref 10–24)
BUN: 15 mg/dL (ref 10–36)
CO2: 28 mmol/L (ref 20–29)
Calcium: 9.1 mg/dL (ref 8.6–10.2)
Chloride: 97 mmol/L (ref 96–106)
Creatinine, Ser: 1.12 mg/dL (ref 0.76–1.27)
Glucose: 108 mg/dL — ABNORMAL HIGH (ref 70–99)
Potassium: 4.2 mmol/L (ref 3.5–5.2)
Sodium: 142 mmol/L (ref 134–144)
eGFR: 61 mL/min/1.73 (ref >59)

## 2024-07-18 MED ORDER — TELMISARTAN 20 MG PO TABS: 20.0000 mg | ORAL_TABLET | Freq: Every day | ORAL | 3 refills | Status: DC

## 2024-08-01 ENCOUNTER — Ambulatory Visit: Admitting: Cardiology

## 2024-08-14 DIAGNOSIS — M170 Bilateral primary osteoarthritis of knee: Secondary | ICD-10-CM | POA: Diagnosis not present

## 2024-08-14 DIAGNOSIS — R55 Syncope and collapse: Secondary | ICD-10-CM | POA: Diagnosis not present

## 2024-08-15 ENCOUNTER — Telehealth: Payer: Self-pay | Admitting: Cardiology

## 2024-08-15 DIAGNOSIS — I25810 Atherosclerosis of coronary artery bypass graft(s) without angina pectoris: Secondary | ICD-10-CM

## 2024-08-15 DIAGNOSIS — I5032 Chronic diastolic (congestive) heart failure: Secondary | ICD-10-CM

## 2024-08-15 DIAGNOSIS — R55 Syncope and collapse: Secondary | ICD-10-CM

## 2024-08-15 DIAGNOSIS — I10 Essential (primary) hypertension: Secondary | ICD-10-CM

## 2024-08-15 NOTE — Telephone Encounter (Signed)
 Spoke to patient's daughter Nichole monitor results given.She stated father continues to have episodes he feels weak and feels faint at times.He has not blacked out.Appointment scheduled with Reche Finder NP 9/15 at 1:55 pm at [facility] office.

## 2024-08-15 NOTE — Telephone Encounter (Signed)
 Pt daughter calling to discuss recent test results. Please advise.

## 2024-08-16 DIAGNOSIS — R42 Dizziness and giddiness: Secondary | ICD-10-CM | POA: Diagnosis not present

## 2024-08-16 DIAGNOSIS — Z6833 Body mass index (BMI) 33.0-33.9, adult: Secondary | ICD-10-CM | POA: Diagnosis not present

## 2024-08-20 ENCOUNTER — Encounter (HOSPITAL_BASED_OUTPATIENT_CLINIC_OR_DEPARTMENT_OTHER): Payer: Self-pay | Admitting: Family

## 2024-08-20 ENCOUNTER — Other Ambulatory Visit: Payer: Self-pay

## 2024-08-20 ENCOUNTER — Ambulatory Visit (HOSPITAL_BASED_OUTPATIENT_CLINIC_OR_DEPARTMENT_OTHER): Admitting: Family

## 2024-08-20 ENCOUNTER — Emergency Department (HOSPITAL_BASED_OUTPATIENT_CLINIC_OR_DEPARTMENT_OTHER)

## 2024-08-20 ENCOUNTER — Encounter (HOSPITAL_BASED_OUTPATIENT_CLINIC_OR_DEPARTMENT_OTHER): Payer: Self-pay | Admitting: Emergency Medicine

## 2024-08-20 ENCOUNTER — Inpatient Hospital Stay (HOSPITAL_BASED_OUTPATIENT_CLINIC_OR_DEPARTMENT_OTHER)
Admission: EM | Admit: 2024-08-20 | Discharge: 2024-08-31 | DRG: 177 | Disposition: A | Discharge status: Skilled Nursing Facility | Attending: Internal Medicine | Admitting: Internal Medicine

## 2024-08-20 VITALS — BP 122/70 | HR 66 | Resp 15 | Ht 65.0 in | Wt 197.0 lb

## 2024-08-20 DIAGNOSIS — K219 Gastro-esophageal reflux disease without esophagitis: Secondary | ICD-10-CM | POA: Diagnosis not present

## 2024-08-20 DIAGNOSIS — R488 Other symbolic dysfunctions: Secondary | ICD-10-CM | POA: Diagnosis not present

## 2024-08-20 DIAGNOSIS — M6281 Muscle weakness (generalized): Secondary | ICD-10-CM | POA: Diagnosis not present

## 2024-08-20 DIAGNOSIS — J9601 Acute respiratory failure with hypoxia: Secondary | ICD-10-CM | POA: Diagnosis not present

## 2024-08-20 DIAGNOSIS — J40 Bronchitis, not specified as acute or chronic: Secondary | ICD-10-CM | POA: Diagnosis not present

## 2024-08-20 DIAGNOSIS — J9621 Acute and chronic respiratory failure with hypoxia: Secondary | ICD-10-CM | POA: Diagnosis present

## 2024-08-20 DIAGNOSIS — Z711 Person with feared health complaint in whom no diagnosis is made: Secondary | ICD-10-CM | POA: Diagnosis not present

## 2024-08-20 DIAGNOSIS — M1611 Unilateral primary osteoarthritis, right hip: Secondary | ICD-10-CM | POA: Diagnosis present

## 2024-08-20 DIAGNOSIS — I5033 Acute on chronic diastolic (congestive) heart failure: Secondary | ICD-10-CM | POA: Diagnosis not present

## 2024-08-20 DIAGNOSIS — Z6832 Body mass index (BMI) 32.0-32.9, adult: Secondary | ICD-10-CM | POA: Diagnosis not present

## 2024-08-20 DIAGNOSIS — Z886 Allergy status to analgesic agent status: Secondary | ICD-10-CM

## 2024-08-20 DIAGNOSIS — Z634 Disappearance and death of family member: Secondary | ICD-10-CM

## 2024-08-20 DIAGNOSIS — R0902 Hypoxemia: Secondary | ICD-10-CM | POA: Diagnosis not present

## 2024-08-20 DIAGNOSIS — Z515 Encounter for palliative care: Secondary | ICD-10-CM | POA: Diagnosis not present

## 2024-08-20 DIAGNOSIS — I2489 Other forms of acute ischemic heart disease: Secondary | ICD-10-CM | POA: Diagnosis present

## 2024-08-20 DIAGNOSIS — E7800 Pure hypercholesterolemia, unspecified: Secondary | ICD-10-CM | POA: Diagnosis present

## 2024-08-20 DIAGNOSIS — Z66 Do not resuscitate: Secondary | ICD-10-CM | POA: Diagnosis present

## 2024-08-20 DIAGNOSIS — J9602 Acute respiratory failure with hypercapnia: Secondary | ICD-10-CM | POA: Diagnosis not present

## 2024-08-20 DIAGNOSIS — E785 Hyperlipidemia, unspecified: Secondary | ICD-10-CM | POA: Diagnosis not present

## 2024-08-20 DIAGNOSIS — E875 Hyperkalemia: Secondary | ICD-10-CM | POA: Diagnosis present

## 2024-08-20 DIAGNOSIS — N400 Enlarged prostate without lower urinary tract symptoms: Secondary | ICD-10-CM | POA: Diagnosis present

## 2024-08-20 DIAGNOSIS — Z7951 Long term (current) use of inhaled steroids: Secondary | ICD-10-CM

## 2024-08-20 DIAGNOSIS — I272 Pulmonary hypertension, unspecified: Secondary | ICD-10-CM | POA: Diagnosis not present

## 2024-08-20 DIAGNOSIS — Z96641 Presence of right artificial hip joint: Secondary | ICD-10-CM | POA: Diagnosis present

## 2024-08-20 DIAGNOSIS — Z888 Allergy status to other drugs, medicaments and biological substances status: Secondary | ICD-10-CM

## 2024-08-20 DIAGNOSIS — F05 Delirium due to known physiological condition: Secondary | ICD-10-CM | POA: Diagnosis not present

## 2024-08-20 DIAGNOSIS — R410 Disorientation, unspecified: Secondary | ICD-10-CM | POA: Diagnosis not present

## 2024-08-20 DIAGNOSIS — I10 Essential (primary) hypertension: Secondary | ICD-10-CM | POA: Diagnosis not present

## 2024-08-20 DIAGNOSIS — K761 Chronic passive congestion of liver: Secondary | ICD-10-CM | POA: Diagnosis not present

## 2024-08-20 DIAGNOSIS — M6289 Other specified disorders of muscle: Secondary | ICD-10-CM | POA: Diagnosis not present

## 2024-08-20 DIAGNOSIS — Z7189 Other specified counseling: Secondary | ICD-10-CM | POA: Diagnosis not present

## 2024-08-20 DIAGNOSIS — J9622 Acute and chronic respiratory failure with hypercapnia: Secondary | ICD-10-CM | POA: Diagnosis not present

## 2024-08-20 DIAGNOSIS — I5031 Acute diastolic (congestive) heart failure: Secondary | ICD-10-CM | POA: Diagnosis not present

## 2024-08-20 DIAGNOSIS — Z23 Encounter for immunization: Secondary | ICD-10-CM | POA: Diagnosis not present

## 2024-08-20 DIAGNOSIS — K5900 Constipation, unspecified: Secondary | ICD-10-CM | POA: Diagnosis not present

## 2024-08-20 DIAGNOSIS — N1831 Chronic kidney disease, stage 3a: Secondary | ICD-10-CM | POA: Diagnosis present

## 2024-08-20 DIAGNOSIS — R627 Adult failure to thrive: Secondary | ICD-10-CM | POA: Diagnosis present

## 2024-08-20 DIAGNOSIS — E8729 Other acidosis: Secondary | ICD-10-CM | POA: Diagnosis not present

## 2024-08-20 DIAGNOSIS — Z91199 Patient's noncompliance with other medical treatment and regimen due to unspecified reason: Secondary | ICD-10-CM

## 2024-08-20 DIAGNOSIS — I509 Heart failure, unspecified: Secondary | ICD-10-CM | POA: Diagnosis not present

## 2024-08-20 DIAGNOSIS — Z885 Allergy status to narcotic agent status: Secondary | ICD-10-CM

## 2024-08-20 DIAGNOSIS — F419 Anxiety disorder, unspecified: Secondary | ICD-10-CM | POA: Diagnosis present

## 2024-08-20 DIAGNOSIS — C801 Malignant (primary) neoplasm, unspecified: Secondary | ICD-10-CM | POA: Diagnosis not present

## 2024-08-20 DIAGNOSIS — G4733 Obstructive sleep apnea (adult) (pediatric): Secondary | ICD-10-CM | POA: Diagnosis present

## 2024-08-20 DIAGNOSIS — G9341 Metabolic encephalopathy: Secondary | ICD-10-CM | POA: Diagnosis present

## 2024-08-20 DIAGNOSIS — J449 Chronic obstructive pulmonary disease, unspecified: Secondary | ICD-10-CM | POA: Diagnosis present

## 2024-08-20 DIAGNOSIS — G629 Polyneuropathy, unspecified: Secondary | ICD-10-CM | POA: Diagnosis not present

## 2024-08-20 DIAGNOSIS — Z7401 Bed confinement status: Secondary | ICD-10-CM | POA: Diagnosis not present

## 2024-08-20 DIAGNOSIS — I130 Hypertensive heart and chronic kidney disease with heart failure and stage 1 through stage 4 chronic kidney disease, or unspecified chronic kidney disease: Secondary | ICD-10-CM | POA: Diagnosis present

## 2024-08-20 DIAGNOSIS — Z789 Other specified health status: Secondary | ICD-10-CM | POA: Diagnosis not present

## 2024-08-20 DIAGNOSIS — Z6830 Body mass index (BMI) 30.0-30.9, adult: Secondary | ICD-10-CM | POA: Diagnosis not present

## 2024-08-20 DIAGNOSIS — R638 Other symptoms and signs concerning food and fluid intake: Secondary | ICD-10-CM | POA: Diagnosis not present

## 2024-08-20 DIAGNOSIS — U071 COVID-19: Secondary | ICD-10-CM | POA: Diagnosis not present

## 2024-08-20 DIAGNOSIS — E441 Mild protein-calorie malnutrition: Secondary | ICD-10-CM | POA: Diagnosis not present

## 2024-08-20 DIAGNOSIS — I2510 Atherosclerotic heart disease of native coronary artery without angina pectoris: Secondary | ICD-10-CM | POA: Diagnosis not present

## 2024-08-20 DIAGNOSIS — F4323 Adjustment disorder with mixed anxiety and depressed mood: Secondary | ICD-10-CM | POA: Diagnosis not present

## 2024-08-20 DIAGNOSIS — M159 Polyosteoarthritis, unspecified: Secondary | ICD-10-CM | POA: Diagnosis not present

## 2024-08-20 DIAGNOSIS — I700 Atherosclerosis of aorta: Secondary | ICD-10-CM | POA: Diagnosis not present

## 2024-08-20 DIAGNOSIS — Z951 Presence of aortocoronary bypass graft: Secondary | ICD-10-CM

## 2024-08-20 DIAGNOSIS — Z91013 Allergy to seafood: Secondary | ICD-10-CM

## 2024-08-20 DIAGNOSIS — J90 Pleural effusion, not elsewhere classified: Secondary | ICD-10-CM | POA: Diagnosis not present

## 2024-08-20 DIAGNOSIS — R2689 Other abnormalities of gait and mobility: Secondary | ICD-10-CM | POA: Diagnosis not present

## 2024-08-20 DIAGNOSIS — Z8249 Family history of ischemic heart disease and other diseases of the circulatory system: Secondary | ICD-10-CM

## 2024-08-20 DIAGNOSIS — R0600 Dyspnea, unspecified: Secondary | ICD-10-CM | POA: Diagnosis not present

## 2024-08-20 DIAGNOSIS — E66811 Obesity, class 1: Secondary | ICD-10-CM | POA: Diagnosis present

## 2024-08-20 DIAGNOSIS — D696 Thrombocytopenia, unspecified: Secondary | ICD-10-CM | POA: Diagnosis present

## 2024-08-20 DIAGNOSIS — J99 Respiratory disorders in diseases classified elsewhere: Secondary | ICD-10-CM | POA: Diagnosis not present

## 2024-08-20 DIAGNOSIS — R54 Age-related physical debility: Secondary | ICD-10-CM | POA: Diagnosis present

## 2024-08-20 DIAGNOSIS — N179 Acute kidney failure, unspecified: Secondary | ICD-10-CM | POA: Diagnosis present

## 2024-08-20 DIAGNOSIS — J9811 Atelectasis: Secondary | ICD-10-CM | POA: Diagnosis not present

## 2024-08-20 DIAGNOSIS — Z7982 Long term (current) use of aspirin: Secondary | ICD-10-CM

## 2024-08-20 DIAGNOSIS — Z85820 Personal history of malignant melanoma of skin: Secondary | ICD-10-CM

## 2024-08-20 DIAGNOSIS — Z79899 Other long term (current) drug therapy: Secondary | ICD-10-CM

## 2024-08-20 DIAGNOSIS — R1311 Dysphagia, oral phase: Secondary | ICD-10-CM | POA: Diagnosis not present

## 2024-08-20 HISTORY — DX: Heart failure, unspecified: I50.9

## 2024-08-20 LAB — I-STAT ARTERIAL BLOOD GAS, ED
Acid-Base Excess: 1 mmol/L (ref 0.0–2.0)
Acid-Base Excess: 2 mmol/L (ref 0.0–2.0)
Bicarbonate: 33.3 mmol/L — ABNORMAL HIGH (ref 20.0–28.0)
Bicarbonate: 32.1 mmol/L — ABNORMAL HIGH (ref 20.0–28.0)
Calcium, Ion: 1.23 mmol/L (ref 1.15–1.40)
Calcium, Ion: 1.2 mmol/L (ref 1.15–1.40)
HCT: 44 % (ref 39.0–52.0)
HCT: 42 % (ref 39.0–52.0)
Hemoglobin: 15 g/dL (ref 13.0–17.0)
Hemoglobin: 14.3 g/dL (ref 13.0–17.0)
O2 Saturation: 96 %
O2 Saturation: 90 %
Potassium: 4.4 mmol/L (ref 3.5–5.1)
Potassium: 4.6 mmol/L (ref 3.5–5.1)
Sodium: 140 mmol/L (ref 135–145)
Sodium: 140 mmol/L (ref 135–145)
TCO2: 36 mmol/L — ABNORMAL HIGH (ref 22–32)
TCO2: 34 mmol/L — ABNORMAL HIGH (ref 22–32)
pCO2 arterial: 98.3 mmHg (ref 32–48)
pCO2 arterial: 79.9 mmHg (ref 32–48)
pH, Arterial: 7.138 — CL (ref 7.35–7.45)
pH, Arterial: 7.211 — ABNORMAL LOW (ref 7.35–7.45)
pO2, Arterial: 114 mmHg — ABNORMAL HIGH (ref 83–108)
pO2, Arterial: 75 mmHg — ABNORMAL LOW (ref 83–108)

## 2024-08-20 LAB — CBC WITH DIFFERENTIAL/PLATELET
Abs Immature Granulocytes: 0.03 K/uL (ref 0.00–0.07)
Basophils Absolute: 0 K/uL (ref 0.0–0.1)
Basophils Relative: 0 %
Eosinophils Absolute: 0.1 K/uL (ref 0.0–0.5)
Eosinophils Relative: 1 %
HCT: 42.2 % (ref 39.0–52.0)
Hemoglobin: 13.2 g/dL (ref 13.0–17.0)
Immature Granulocytes: 0 %
Lymphocytes Relative: 17 %
Lymphs Abs: 1.4 K/uL (ref 0.7–4.0)
MCH: 30.1 pg (ref 26.0–34.0)
MCHC: 31.3 g/dL (ref 30.0–36.0)
MCV: 96.3 fL (ref 80.0–100.0)
Monocytes Absolute: 1.2 K/uL — ABNORMAL HIGH (ref 0.1–1.0)
Monocytes Relative: 14 %
Neutro Abs: 5.7 K/uL (ref 1.7–7.7)
Neutrophils Relative %: 68 %
Platelets: 127 K/uL — ABNORMAL LOW (ref 150–400)
RBC: 4.38 MIL/uL (ref 4.22–5.81)
RDW: 17.2 % — ABNORMAL HIGH (ref 11.5–15.5)
WBC: 8.4 K/uL (ref 4.0–10.5)
nRBC: 0.2 % (ref 0.0–0.2)

## 2024-08-20 LAB — COMPREHENSIVE METABOLIC PANEL WITH GFR
ALT: 45 U/L — ABNORMAL HIGH (ref 0–44)
AST: 60 U/L — ABNORMAL HIGH (ref 15–41)
Albumin: 4.2 g/dL (ref 3.5–5.0)
Alkaline Phosphatase: 89 U/L (ref 38–126)
Anion gap: 15 (ref 5–15)
BUN: 53 mg/dL — ABNORMAL HIGH (ref 8–23)
CO2: 29 mmol/L (ref 22–32)
Calcium: 9.6 mg/dL (ref 8.9–10.3)
Chloride: 97 mmol/L — ABNORMAL LOW (ref 98–111)
Creatinine, Ser: 2.78 mg/dL — ABNORMAL HIGH (ref 0.61–1.24)
GFR, Estimated: 20 mL/min — ABNORMAL LOW (ref >60)
Glucose, Bld: 100 mg/dL — ABNORMAL HIGH (ref 70–99)
Potassium: 4.9 mmol/L (ref 3.5–5.1)
Sodium: 141 mmol/L (ref 135–145)
Total Bilirubin: 2 mg/dL — ABNORMAL HIGH (ref 0.0–1.2)
Total Protein: 7 g/dL (ref 6.5–8.1)

## 2024-08-20 LAB — TROPONIN T, HIGH SENSITIVITY
Troponin T High Sensitivity: 83 ng/L — ABNORMAL HIGH (ref 0–19)
Troponin T High Sensitivity: 78 ng/L — ABNORMAL HIGH (ref 0–19)

## 2024-08-20 LAB — RESP PANEL BY RT-PCR (RSV, FLU A&B, COVID)  RVPGX2
Influenza A by PCR: NEGATIVE
Influenza B by PCR: NEGATIVE
Resp Syncytial Virus by PCR: NEGATIVE
SARS Coronavirus 2 by RT PCR: POSITIVE — AB

## 2024-08-20 LAB — PRO BRAIN NATRIURETIC PEPTIDE: Pro Brain Natriuretic Peptide: 14090 pg/mL — ABNORMAL HIGH (ref <300.0)

## 2024-08-20 LAB — CBG MONITORING, ED: Glucose-Capillary: 109 mg/dL — ABNORMAL HIGH (ref 70–99)

## 2024-08-20 MED ORDER — FUROSEMIDE 10 MG/ML IJ SOLN
40.0000 mg | Freq: Once | INTRAMUSCULAR | Status: AC
  Administered 2024-08-20: 40 mg via INTRAVENOUS
  Filled 2024-08-20: qty 4

## 2024-08-20 NOTE — ED Notes (Signed)
 Patient placed on Bipap at this time per EDP order. Patient somewhat cooperative with wearing Bipap unit. See flowsheet for documentation.

## 2024-08-20 NOTE — ED Provider Notes (Signed)
 [facility] EMERGENCY DEPARTMENT AT [facility] Provider Note   CSN: [id number] Arrival date & time: 08/20/24  1446     Patient presents with: Shortness of Breath   Scott [name] is a 89 y.o. male.   HPI 89 year old male presents for evaluation of shortness of breath.  Patient was sent down from the cardiology clinic for hypoxia.  Patient does not wear oxygen at baseline.  He is at a cough that is nonproductive as well as dyspnea for a few weeks.  Leg swelling seems to be getting worse during this time.  He denies any chest pain.  He was put on oxygen, up to 4 L which brought his O2 sats up from the 70s.  The patient thinks he has been having fevers, most recently yesterday with a temperature of 101.  His shortness of breath feels a lot better with the oxygen.  Prior to Admission medications   Medication Sig Start Date End Date Taking? Authorizing Provider  albuterol  (VENTOLIN  HFA) 108 (90 Base) MCG/ACT inhaler Inhale 1-2 puffs into the lungs every 6 (six) hours as needed for wheezing or shortness of breath. 11/28/20   Genia Hila, MD  aspirin  81 MG tablet Take 81 mg by mouth every evening.    [provider]  BREO ELLIPTA  100-25 MCG/INH AEPB Inhale 1 puff into the lungs daily. 12/23/20   [provider]  budesonide-formoterol (SYMBICORT) 160-4.5 MCG/ACT inhaler Inhale 2 puffs into the lungs 2 (two) times daily. 04/03/24   [provider]  EPINEPHrine  0.3 mg/0.3 mL IJ SOAJ injection Inject 0.3 mg into the muscle as needed for anaphylaxis. 11/28/20   Genia Hila, MD  fish oil-omega-3 fatty acids 1000 MG capsule Take 1 g by mouth every other day.    [provider]  furosemide  (LASIX ) 20 MG tablet Take 1 tablet (20 mg total) by mouth daily. Take 20mg  daily and take an extra dose for weight gain of 3lb in 1 day or 5lb in 1 week 06/29/24   [place], Peter M, MD  Influenza Vac High-Dose Quad (FLUZONE HIGH-DOSE QUADRIVALENT) 0.7 ML SUSY Fluzone  High-Dose Quad 2020-21 (PF) 240 mcg/0.7 mL IM syringe  PHARMACY ADMINISTERED    [provider]  lovastatin (MEVACOR) 20 MG tablet Take 20 mg by mouth at bedtime.    [provider]  meclizine  (ANTIVERT ) 12.5 MG tablet Take 1 tablet (12.5 mg total) by mouth 3 (three) times daily as needed for dizziness. 01/04/23   Patt Alm Macho, MD  metoprolol  (LOPRESSOR ) 50 MG tablet Take 25 mg by mouth 2 (two) times daily.    [provider]  mirtazapine (REMERON) 15 MG tablet Take 15 mg by mouth at bedtime.    [provider]  pantoprazole  (PROTONIX ) 20 MG tablet Take 1 tablet (20 mg total) by mouth daily. 04/05/23   Emelia Josefa HERO, NP  polyethylene glycol (MIRALAX  / GLYCOLAX ) 17 g packet Take 17 g by mouth daily. 05/27/24   Fairy Frames, MD  potassium chloride  (K-DUR,KLOR-CON ) 10 MEQ tablet Take 10 mEq by mouth every evening.     [provider]  senna-docusate (SENOKOT-S) 8.6-50 MG tablet Take 1 tablet by mouth 2 (two) times daily. 05/27/24   Fairy Frames, MD  tamsulosin  (FLOMAX ) 0.4 MG CAPS capsule Take 1 capsule (0.4 mg total) by mouth at bedtime. 09/28/23   Stoneking, Adine PARAS., MD  telmisartan  (MICARDIS ) 20 MG tablet Take 1 tablet (20 mg total) by mouth daily. 07/18/24   [place], Peter M, MD  temazepam  (RESTORIL ) 30 MG capsule Take 1 capsule by mouth at bedtime. 04/03/24   [provider]  traZODone  (DESYREL ) 50 MG tablet Take 1 tablet (50 mg total) by mouth at bedtime. 12/03/14   Neysa Rama D, MD  triazolam  (HALCION ) 0.25 MG tablet Take 0.25 mg by mouth at bedtime as needed.    [provider]  vitamin E  400 UNIT capsule Take 400 Units by mouth daily.     [provider]    Allergies: Acetaminophen , Amlodipine, Bactrim  [sulfamethoxazole -trimethoprim ], Codeine, Fexofenadine, Ibuprofen , Lovenox [enoxaparin sodium], Naproxen, Prednisone, Shellfish allergy, and Sonata  [zaleplon ]    Review of Systems  Constitutional:   Positive for fever.  Respiratory:  Positive for cough and shortness of breath.   Cardiovascular:  Positive for leg swelling. Negative for chest pain.  Gastrointestinal:  Negative for abdominal pain.    Updated Vital Signs BP (!) 152/85   Pulse 69   Temp 98 F (36.7 C) (Oral)   Resp 20   SpO2 100%   Physical Exam Vitals and nursing note reviewed.  Constitutional:      General: He is not in acute distress.    Appearance: He is well-developed. He is not ill-appearing or diaphoretic.  HENT:     Head: Normocephalic and atraumatic.  Cardiovascular:     Rate and Rhythm: Normal rate and regular rhythm.     Heart sounds: Normal heart sounds.  Pulmonary:     Effort: Pulmonary effort is normal. No tachypnea, accessory muscle usage or respiratory distress.     Breath sounds: Decreased breath sounds (mild, bases) present.  Abdominal:     Palpations: Abdomen is soft.     Tenderness: There is no abdominal tenderness.  Musculoskeletal:     Right lower leg: Edema present.     Left lower leg: Edema present.     Comments: Pitting edema to bilateral lower legs  Skin:    General: Skin is warm and dry.  Neurological:     Mental Status: He is alert.     (all labs ordered are listed, but only abnormal results are displayed) Labs Reviewed  RESP PANEL BY RT-PCR (RSV, FLU A&B, COVID)  RVPGX2 - Abnormal; Notable for the following components:      Result Value   SARS Coronavirus 2 by RT PCR POSITIVE (*)    All other components within normal limits  COMPREHENSIVE METABOLIC PANEL WITH GFR - Abnormal; Notable for the following components:   Chloride 97 (*)    Glucose, Bld 100 (*)    BUN 53 (*)    Creatinine, Ser 2.78 (*)    AST 60 (*)    ALT 45 (*)    Total Bilirubin 2.0 (*)    GFR, Estimated 20 (*)    All other components within normal limits  PRO BRAIN NATRIURETIC PEPTIDE - Abnormal; Notable for the following components:   Pro Brain Natriuretic Peptide 14,090.0 (*)    All other  components within normal limits  CBC WITH DIFFERENTIAL/PLATELET - Abnormal; Notable for the following components:   RDW 17.2 (*)    Platelets 127 (*)    Monocytes Absolute 1.2 (*)    All other components within normal limits  TROPONIN T, HIGH SENSITIVITY - Abnormal; Notable for the following components:   Troponin T High Sensitivity 83 (*)    All other components within normal limits    EKG: None  Radiology: DG Chest Portable 1 View Result Date: 08/20/2024 CLINICAL DATA:  Hypoxia EXAM: PORTABLE CHEST 1  VIEW COMPARISON:  None Available. FINDINGS: Normal mediastinum and cardiac silhouette. Midline sternotomy. Normal pulmonary vasculature. No evidence of effusion, infiltrate, or pneumothorax. No acute bony abnormality. IMPRESSION: No acute cardiopulmonary process. Electronically Signed   By: Jackquline Boxer M.D.   On: 08/20/2024 16:11     .Critical Care  Performed by: Freddi Hamilton, MD Authorized by: Freddi Hamilton, MD   Critical care provider statement:    Critical care time (minutes):  45   Critical care time was exclusive of:  Separately billable procedures and treating other patients   Critical care was necessary to treat or prevent imminent or life-threatening deterioration of the following conditions:  Respiratory failure   Critical care was time spent personally by me on the following activities:  Development of treatment plan with patient or surrogate, discussions with consultants, evaluation of patient's response to treatment, examination of patient, ordering and review of laboratory studies, ordering and review of radiographic studies, ordering and performing treatments and interventions, pulse oximetry, re-evaluation of patient's condition and review of old charts    Medications Ordered in the ED  furosemide  (LASIX ) injection 40 mg (has no administration in time range)    Clinical Course as of 08/20/24 2320  Mon Aug 20, 2024  2024 I was called to the bedside to  reassess patient.  He is a lot less alert.  He still wakes up to voice.  He will follow some commands and talk but now he is more mumbling and harder to understand.  Will open his eyes.  No meningismus.  Will get a CBG, ABG and order head CT.  He is protecting his airway and his O2 sats are 100%. [SG]  2040 ABG consistent with respiratory acidosis.  I have ordered BiPAP and discussed with the respiratory therapist.  CT head will also be obtained though it seems like this respiratory acidosis is the cause.  I did discuss with hospitalist, Dr. Sundil, and he already has a progressive bed ordered so he should be able to go there as long as he tolerates BiPAP. [SG]    Clinical Course User Index [SG] Freddi Hamilton, MD                                 Medical Decision Making Amount and/or Complexity of Data Reviewed External Data Reviewed: notes. Labs: ordered.    Details: Elevated BNP, AKI. Troponin elevated, likely from CHF. Radiology: ordered and independent interpretation performed.    Details: No pneumonia ECG/medicine tests: ordered and independent interpretation performed.    Details: Afib  Risk Prescription drug management. Decision regarding hospitalization.   Patient presents with shortness of breath.  Found to have COVID-19 but also I suspect his symptoms are from CHF.  He has obvious peripheral edema and now has an acute kidney injury that I suspect is from fluid.  He did have some progressive O2 requirements going from nasal cannula to Ventimask as he is holding his mouth open and does not appear to be taking as much good inspiration through the nose.  Discussed with Dr. Ezenduka for admission.  However, as above, he developed  somnolence and was found to have a respiratory acidosis.  After BiPAP, he is a little more alert and able to more easily answer questions and wake up easier.  pH is slowly improving.  I think he is now stable for transfer.  I did update the hospitalist  service, Dr. Sundil, about  his change in status, and she we will keep him in the progressive unit.  I do not think he needs intubation at this time.      Final diagnoses:  Acute on chronic congestive heart failure, unspecified heart failure type [facility])  COVID-19    ED Discharge Orders     None          Freddi Hamilton, MD 08/20/24 [phone]

## 2024-08-20 NOTE — Plan of Care (Addendum)
 Received a call from Dr. Veronia that this patient was being admitted under [facility] service for CHF exacerbation.  Given patient was complaining about shortness of breath ABG obtained which showed patient is retaining pCO2 and patient being started on BiPAP both for management of hypoxia and hypercarbia. When discussed with ED physician Dr. Veronia stated that patient is alert and will able to tolerate BiPAP at this time he does not feel clinically it is indicated to transfer the patient to ICU. Respiratory therapy is on board making BiPAP adjustment and repeat ABG check around 2 AM.  Evalene Vath, MD [facility] 08/20/2024, 11:45 PM    Update, per chart review respiratory panel positive with COVID.  Chest x-ray no of pneumonia.-Per chart review patient has history of swelling with prednisone that is causing limiting use of Decadron  and Solu-Medrol . -Continue supportive care.

## 2024-08-20 NOTE — ED Triage Notes (Signed)
 Pt brought in from upstairs cardiac provider d/t 72% on room air, and pt sounding wet. Pt placed on 6L [place] for O2 recovery, then changed to 4L after recovery to 100%

## 2024-08-20 NOTE — ED Notes (Signed)
 Orders given for ABG. Critical results given to EDP. Orders to place patient on Bipap given.

## 2024-08-20 NOTE — ED Notes (Signed)
 Collected 2 set of BC, LA and blue top with ordered labs

## 2024-08-20 NOTE — ED Notes (Signed)
 CL called for transport spoke to [facility]

## 2024-08-20 NOTE — Progress Notes (Signed)
 Cardiology Office Note   Date:  08/20/2024  ID:  Scott [name], DOB 1928-11-20, MRN [id number] PCP: Scott Senior, MD  [facility] Providers Cardiologist:  Scott [place], MD     History of Present Illness Scott [name] is a 89 y.o. male with history of CAD s/p CABG 2007, HLD, HTN, HFpEF, syncope, OSA.   Admission 05/2024 with acute on chronic diastolic heart failure with poor compliance with Lasix  at baseline.  Echo LVEF 60 to 65%, currently dilated RA and LA.  He was diuresed 4 L.  Last seen 07/17/24 with syncope following coughing spell. Subsequent 2 week ZIO monitor with predominantly NSR, Mobitz 1 second degree AV block with sleep. Symptoms did not correspond with arrhythmia.  Telmisartan  resumed at 20mg  daily due to BP elevated.   Presents today for follow up with his son. Notes near syncope occurring daily. Reports no appetite, particularly after his wife passed away in 2024/05/12. L foot swelling ongoing for 15 years but progresed recently. Bilateral 2+ pitting edema without erythema on exam. Family also notes more confusion which is not his baseline. Notes leeping more, all day long. Reports exertional dyspnea. no orthopnea but no PND. He will use his PRN albuterol  when he is having an episode with some improvement. Reports no chest pain, pressure, tightness. He has had a cough ongoing for >1 week, he reports abdominal pain related to coughing.   Discharge weight from hospital in June 190 lbs now up to 197 lbs. Family member reiterates that he is not eating. He is drinking some coffee and water but not much.   Concerned about missing appointment for gel injections to his knees tomorrow with Dr. Heide. Discussed importance of getting excess volume off.   ROS: Please see the history of present illness.    All other systems reviewed and are negative.   Studies Reviewed      Cardiac Studies & Procedures    ______________________________________________________________________________________________     ECHOCARDIOGRAM  ECHOCARDIOGRAM COMPLETE 05/25/2024  Narrative ECHOCARDIOGRAM REPORT    Patient Name:   Scott [name] Date of Exam: 05/25/2024 Medical Rec #:  [id number]       Height:       65.0 in Accession #:    [id number]      Weight:       197.0 lb Date of Birth:  [birth year]       BSA:          1.965 m Patient Age:    89 years        BP:           102/61 mmHg Patient Gender: M               HR:           68 bpm. Exam Location:  Inpatient  Procedure: 2D Echo, Cardiac Doppler and Color Doppler (Both Spectral and Color Flow Doppler were utilized during procedure).  Indications:    Atrial Fibrillation I48.91, CHF I50.31  History:        Patient has prior history of Echocardiogram examinations, most recent 09/10/2019. CHF, CAD, Signs/Symptoms:Dyspnea and Shortness of Breath; Risk Factors:Hypertension and Sleep Apnea.  Sonographer:    Scott [name] Referring Phys: [id number] Scott [name]  IMPRESSIONS   1. Left ventricular ejection fraction, by estimation, is 60 to 65%. The left ventricle has normal function. The left ventricle has no regional wall motion abnormalities. Left ventricular diastolic parameters are indeterminate. 2. Right ventricular systolic function is normal. The  right ventricular size is normal. 3. Left atrial size was moderately dilated. 4. Right atrial size was moderately dilated. 5. The mitral valve is abnormal. Mild mitral valve regurgitation. No evidence of mitral stenosis. 6. The aortic valve is tricuspid. There is moderate calcification of the aortic valve. Aortic valve regurgitation is trivial. Aortic valve sclerosis is present, with no evidence of aortic valve stenosis. 7. There is moderate dilatation of the ascending aorta, measuring 44 mm. 8. The inferior vena cava is normal in size with greater than 50% respiratory variability, suggesting right  atrial pressure of 3 mmHg.  FINDINGS Left Ventricle: Left ventricular ejection fraction, by estimation, is 60 to 65%. The left ventricle has normal function. The left ventricle has no regional wall motion abnormalities. Strain was performed and the global longitudinal strain is indeterminate. The left ventricular internal cavity size was normal in size. There is no left ventricular hypertrophy. Left ventricular diastolic parameters are indeterminate.  Right Ventricle: The right ventricular size is normal. No increase in right ventricular wall thickness. Right ventricular systolic function is normal.  Left Atrium: Left atrial size was moderately dilated.  Right Atrium: Right atrial size was moderately dilated.  Pericardium: There is no evidence of pericardial effusion.  Mitral Valve: The mitral valve is abnormal. There is mild thickening of the mitral valve leaflet(s). There is mild calcification of the mitral valve leaflet(s). Mild mitral valve regurgitation. No evidence of mitral valve stenosis.  Tricuspid Valve: The tricuspid valve is normal in structure. Tricuspid valve regurgitation is mild . No evidence of tricuspid stenosis.  Aortic Valve: The aortic valve is tricuspid. There is moderate calcification of the aortic valve. Aortic valve regurgitation is trivial. Aortic valve sclerosis is present, with no evidence of aortic valve stenosis. Aortic valve peak gradient measures 7.1 mmHg.  Pulmonic Valve: The pulmonic valve was normal in structure. Pulmonic valve regurgitation is not visualized. No evidence of pulmonic stenosis.  Aorta: The aortic root is normal in size and structure. There is moderate dilatation of the ascending aorta, measuring 44 mm.  Venous: The inferior vena cava is normal in size with greater than 50% respiratory variability, suggesting right atrial pressure of 3 mmHg.  IAS/Shunts: No atrial level shunt detected by color flow Doppler.  Additional Comments: 3D was  performed not requiring image post processing on an independent workstation and was indeterminate.   LEFT VENTRICLE PLAX 2D LVIDd:         4.60 cm   Diastology LVIDs:         2.80 cm   LV e' medial:    7.93 cm/s LV PW:         1.10 cm   LV E/e' medial:  18.9 LV IVS:        1.00 cm   LV e' lateral:   11.20 cm/s LVOT diam:     2.10 cm   LV E/e' lateral: 13.4 LV SV:         48 LV SV Index:   25 LVOT Area:     3.46 cm   RIGHT VENTRICLE            IVC RV S prime:     8.70 cm/s  IVC diam: 2.20 cm TAPSE (M-mode): 1.6 cm  LEFT ATRIUM             Index        RIGHT ATRIUM           Index LA diam:  5.20 cm 2.65 cm/m   RA Area:     14.90 cm LA Vol (A2C):   57.2 ml 29.10 ml/m  RA Volume:   38.90 ml  19.79 ml/m LA Vol (A4C):   56.5 ml 28.75 ml/m LA Biplane Vol: 60.4 ml 30.73 ml/m AORTIC VALVE AV Area (Vmax): 2.24 cm AV Vmax:        133.00 cm/s AV Peak Grad:   7.1 mmHg LVOT Vmax:      86.17 cm/s LVOT Vmean:     54.667 cm/s LVOT VTI:       0.140 m  AORTA Ao Root diam: 3.40 cm Ao Asc diam:  4.40 cm  MITRAL VALVE                TRICUSPID VALVE MV Area (PHT): 4.29 cm     TR Peak grad:   38.7 mmHg MV Decel Time: 177 msec     TR Vmax:        311.00 cm/s MV E velocity: 150.00 cm/s SHUNTS Systemic VTI:  0.14 m Systemic Diam: 2.10 cm  Maude Emmer MD Electronically signed by Maude Emmer MD Signature Date/Time: 05/25/2024/4:47:35 PM    Final    MONITORS  LONG TERM MONITOR (3-14 DAYS) 08/14/2024  Narrative   Normal sinus rhythm with first degree AV block   Second degree AV block Mobitz type 1 occuring predominantly in the early am hours   5 beat run of idioventricular rhythm   Otherwise rare PACs and PVCs   Symptoms do not correlate with arrhythmia   Patch Wear Time:  13 days and 23 hours (2025-08-18T15:50:02-399 to 2025-09-01T15:49:55-0400)  Patient had a min HR of 29 bpm, max HR of 109 bpm, and avg HR of 69 bpm. Predominant underlying rhythm was Sinus Rhythm.  First Degree AV Block was present. Second Degree AV Block-Mobitz I (Wenckebach) was present. Isolated SVEs were rare (<1.0%), SVE Couplets were rare (<1.0%), and SVE Triplets were rare (<1.0%). Isolated VEs were rare (<1.0%, 3086), VE Couplets were rare (<1.0%, 52), and VE Triplets were rare (<1.0%, 1). Ventricular Bigeminy and Trigeminy were present.       ______________________________________________________________________________________________      Risk Assessment/Calculations           Physical Exam VS:  BP 122/70   Pulse 66   Resp 15   Ht 5' 5 (1.651 m)   Wt 197 lb (89.4 kg)   SpO2 (!) 88%   BMI 32.78 kg/m   Vitals:   08/20/24 1357 08/20/24 1427  BP: (!) 100/50 122/70  Pulse: 66   Resp: 15   Height: 5' 5 (1.651 m)   Weight: 197 lb (89.4 kg)   SpO2: (!) 83% (!) 88%  BMI (Calculated): 32.78           Wt Readings from Last 3 Encounters:  08/20/24 197 lb (89.4 kg)  07/17/24 191 lb 9.6 oz (86.9 kg)  05/27/24 191 lb 2.2 oz (86.7 kg)    GEN: pale appearing NECK: No JVD; No carotid bruits CARDIAC:RRR, no murmurs, rubs, gallops RESPIRATORY:  bilateral upper extremity rhonchi ABDOMEN: Soft, non-tender, non-distended EXTREMITIES:  No edema; No deformity   ASSESSMENT AND PLAN  HFpEF - weight up 7 lbs from baseline. More than 1 week history of congested cough, increased exertional dyspnea, daily near-syncope with ambulation, bilateral 2+ pitting lower extremity edema, confusion noted by family members. His initial O2 in clinic was 83%, though hands were cold, which improved to 93% on 2L O2. However, subsequently  developed shaking while on O2 and asked for it to be removed. He subsequently had O2 90% on RA. Family member notes mentation seems to have improved post oxygen. Given significant symptoms discussed Furoscix  vs eval in ED for volume overload, IV diuresis. Plan for ED eval for consideration of IV diuresis, CXR, labs. Would recommend monitoring of renal function  and evaluation of CBC to rule out anemia.         Dispo: TBD based on ED evaluation  Signed, Reche GORMAN Finder, NP

## 2024-08-20 NOTE — Progress Notes (Signed)
 Plan of Care Note for accepted transfer   Patient: Scott [name] MRN: [id number]   DOA: 08/20/2024  Facility requesting transfer: [facility] Requesting Provider: Freddi Reason for transfer: Acute CHF, Covid infection Facility course:  89 year old male with history of CAD s/p CABG 2007, HFpEF, hypertension, OSA, presenting from cardiology office after a follow up visit due to hypoxia.  Of note, patient has been having near syncope daily, poor appetite, failure to thrive after wife passed away in 11-May-2024.  Noted bilateral pitting edema with shortness of breath and ongoing cough and fever for the past 1 week.  Workup revealed COVID positive, elevated BNP, AKI, flat trend elevated troponin.  Chest x-ray unremarkable.  Patient started on IV Lasix .  Plan of care: The patient is accepted for admission to Progressive unit, at [facility]..    Author: Lebron JINNY Cage, MD 08/20/2024  Check [email or web address] for on-call coverage.  Nursing staff, Please call [facility] Admits & Consults System-Wide number on Amion as soon as patient's arrival, so appropriate admitting provider can evaluate the pt.

## 2024-08-20 NOTE — ED Notes (Signed)
 RT increased pt's O2 to 6L while sleeping due to O2 saturation at 85% while asleep.

## 2024-08-21 ENCOUNTER — Inpatient Hospital Stay (HOSPITAL_COMMUNITY)

## 2024-08-21 DIAGNOSIS — G9341 Metabolic encephalopathy: Secondary | ICD-10-CM

## 2024-08-21 DIAGNOSIS — I5031 Acute diastolic (congestive) heart failure: Secondary | ICD-10-CM

## 2024-08-21 DIAGNOSIS — N179 Acute kidney failure, unspecified: Secondary | ICD-10-CM

## 2024-08-21 DIAGNOSIS — J9601 Acute respiratory failure with hypoxia: Secondary | ICD-10-CM

## 2024-08-21 DIAGNOSIS — U071 COVID-19: Secondary | ICD-10-CM

## 2024-08-21 DIAGNOSIS — I509 Heart failure, unspecified: Secondary | ICD-10-CM | POA: Diagnosis not present

## 2024-08-21 LAB — CBC
HCT: 43.2 % (ref 39.0–52.0)
Hemoglobin: 12.9 g/dL — ABNORMAL LOW (ref 13.0–17.0)
MCH: 30 pg (ref 26.0–34.0)
MCHC: 29.9 g/dL — ABNORMAL LOW (ref 30.0–36.0)
MCV: 100.5 fL — ABNORMAL HIGH (ref 80.0–100.0)
Platelets: 120 K/uL — ABNORMAL LOW (ref 150–400)
RBC: 4.3 MIL/uL (ref 4.22–5.81)
RDW: 17.2 % — ABNORMAL HIGH (ref 11.5–15.5)
WBC: 10.2 K/uL (ref 4.0–10.5)
nRBC: 0.3 % — ABNORMAL HIGH (ref 0.0–0.2)

## 2024-08-21 LAB — COMPREHENSIVE METABOLIC PANEL WITH GFR
ALT: 41 U/L (ref 0–44)
AST: 49 U/L — ABNORMAL HIGH (ref 15–41)
Albumin: 3.7 g/dL (ref 3.5–5.0)
Alkaline Phosphatase: 66 U/L (ref 38–126)
Anion gap: 14 (ref 5–15)
BUN: 53 mg/dL — ABNORMAL HIGH (ref 8–23)
CO2: 28 mmol/L (ref 22–32)
Calcium: 8.6 mg/dL — ABNORMAL LOW (ref 8.9–10.3)
Chloride: 99 mmol/L (ref 98–111)
Creatinine, Ser: 2.41 mg/dL — ABNORMAL HIGH (ref 0.61–1.24)
GFR, Estimated: 24 mL/min — ABNORMAL LOW (ref >60)
Glucose, Bld: 83 mg/dL (ref 70–99)
Potassium: 4.5 mmol/L (ref 3.5–5.1)
Sodium: 141 mmol/L (ref 135–145)
Total Bilirubin: 1.8 mg/dL — ABNORMAL HIGH (ref 0.0–1.2)
Total Protein: 6.5 g/dL (ref 6.5–8.1)

## 2024-08-21 LAB — BLOOD GAS, ARTERIAL
Acid-Base Excess: 7.7 mmol/L — ABNORMAL HIGH (ref 0.0–2.0)
Acid-Base Excess: 8.4 mmol/L — ABNORMAL HIGH (ref 0.0–2.0)
Bicarbonate: 37.6 mmol/L — ABNORMAL HIGH (ref 20.0–28.0)
Bicarbonate: 39 mmol/L — ABNORMAL HIGH (ref 20.0–28.0)
Drawn by: 744171
Drawn by: 52031
O2 Saturation: 99.2 %
O2 Saturation: 98.9 %
Patient temperature: 37
Patient temperature: 37
pCO2 arterial: 80 mmHg (ref 32–48)
pCO2 arterial: 87 mmHg (ref 32–48)
pH, Arterial: 7.28 — ABNORMAL LOW (ref 7.35–7.45)
pH, Arterial: 7.26 — ABNORMAL LOW (ref 7.35–7.45)
pO2, Arterial: 111 mmHg — ABNORMAL HIGH (ref 83–108)
pO2, Arterial: 128 mmHg — ABNORMAL HIGH (ref 83–108)

## 2024-08-21 LAB — ECHOCARDIOGRAM LIMITED
Height: 65 in
Weight: 3082.91 [oz_av]

## 2024-08-21 MED ORDER — FUROSEMIDE 10 MG/ML IJ SOLN
40.0000 mg | Freq: Two times a day (BID) | INTRAMUSCULAR | Status: DC
  Administered 2024-08-21: 40 mg via INTRAVENOUS
  Filled 2024-08-21 (×2): qty 4

## 2024-08-21 MED ORDER — IPRATROPIUM-ALBUTEROL 0.5-2.5 (3) MG/3ML IN SOLN
3.0000 mL | Freq: Four times a day (QID) | RESPIRATORY_TRACT | Status: DC | PRN
  Administered 2024-08-30: 3 mL via RESPIRATORY_TRACT
  Filled 2024-08-21: qty 3

## 2024-08-21 MED ORDER — DEXAMETHASONE SODIUM PHOSPHATE 10 MG/ML IJ SOLN
6.0000 mg | INTRAMUSCULAR | Status: DC
  Administered 2024-08-21 – 2024-08-22 (×2): 6 mg via INTRAVENOUS
  Filled 2024-08-21 (×2): qty 1

## 2024-08-21 MED ORDER — FUROSEMIDE 10 MG/ML IJ SOLN
80.0000 mg | Freq: Two times a day (BID) | INTRAMUSCULAR | Status: DC
  Administered 2024-08-21 – 2024-08-22 (×2): 80 mg via INTRAVENOUS
  Filled 2024-08-21 (×4): qty 8

## 2024-08-21 MED ORDER — METOPROLOL TARTRATE 25 MG PO TABS
25.0000 mg | ORAL_TABLET | Freq: Two times a day (BID) | ORAL | Status: DC
  Administered 2024-08-22 – 2024-08-31 (×17): 25 mg via ORAL
  Filled 2024-08-21 (×17): qty 1

## 2024-08-21 MED ORDER — LEVALBUTEROL HCL 0.63 MG/3ML IN NEBU: 0.6300 mg | INHALATION_SOLUTION | Freq: Four times a day (QID) | RESPIRATORY_TRACT | Status: DC | PRN

## 2024-08-21 NOTE — H&P (Signed)
 History and Physical    Scott [name] FMW:[id number] DOB: 06/21/1928 DOA: 08/20/2024  PCP: Nanci Senior, MD  Patient coming from: [facility] ED  Chief Complaint: Shortness of breath  HPI: Scott [name] is a 89 y.o. male with medical history significant of CAD status post CABG in 2007, hyperlipidemia, hypertension, HFpEF, CKD stage II-IIIa, COPD, OSA on CPAP, GERD, class I obesity.  Admission in June 2025 for CHF exacerbation due to poor compliance with Lasix .  Patient seen by cardiology yesterday for near syncope, shortness of breath, cough, bilateral lower extremity edema, 7 pound weight gain, and family concerned about confusion.  He was sent to the ED due to concern for CHF exacerbation and hypoxia.  Oxygen saturation 72% room air on arrival to the ED and initially placed on 6 L nasal cannula.  However, while in the ED, patient became less alert/somnolent.  ABG showing pH 7.13 and pCO2 98.3.  Patient was subsequently placed on BiPAP.  Afebrile.  Labs showing no leukocytosis, hemoglobin normal, platelet count 127k (mildly low on previous labs as well and stable), glucose 100, BUN 53, creatinine 2.78 (baseline 1.0-1.2), AST 60, ALT 45, T. bili 2.0 (mildly elevated on previous labs as well), alk phos normal, SARS-CoV-2 PCR positive, proBNP 14,090, troponin 83> 78.  EKG showing possible A-fib but no STEMI.  Chest x-ray showing no acute cardiopulmonary process.  CT head negative for acute intracranial abnormality.  Patient was given IV Lasix  40 mg in the ED.  Patient is on BiPAP and confused, not able to give any history.  I called and spoke to his daughter/POA [name] Bars over the phone.  Daughter informed me that patient has been having worsening shortness of breath for the past 3 weeks and feels like he will pass out even with minimal exertion.  No episodes of loss of consciousness reported.  He has also been gaining weight and has bilateral lower extremity edema despite compliance with Lasix  20 mg  daily.  He has been coughing.  No fevers reported.  Daughter states patient has been confused for the past few days.  Review of Systems:  Review of Systems  All other systems reviewed and are negative.   Past Medical History:  Diagnosis Date   Acute bronchitis 07/11/2012   Acute pancreatitis 04/17/2019   Anemia    Acute blood loss anemia secondary to surgery, followed fr/ Hip replacement    Anxiety    pt. reports that he has occas. nervous episodes    BPH (benign prostatic hyperplasia)    CAD (coronary artery disease)    s/p CABG in 2007  CARDILOLOGIST IS DR. P. SWAZILAND   Cancer (HCC)    melanoma - removed L ear - 2010   CHF (congestive heart failure) (HCC)    Cranial neuropathy    resolved    Generalized OA    GERD (gastroesophageal reflux disease)    H/O echocardiogram    last echo, stress test 05/2012- pt. followed by [facility]    Hypercholesterolemia    Hypertension    Obesity    Osteoarthritis    End-stage osteoarthritis, right hip   Sleep apnea    with use of CPAP   SOB (shortness of breath)    WITH EXERTION   Thrombocytopenia (HCC)    possibly due to Lovenox    Past Surgical History:  Procedure Laterality Date   ACHILLES TENDON SURGERY  10/06/2012   Procedure: ACHILLES TENDON REPAIR;  Surgeon: LELON JONETTA Shari Mickey., MD;  Location: [facility] OR;  Service: Orthopedics;  Laterality: Right;  REPAIR RIGHT RUPTURE ACHILLES TENDON PRIMARY OPEN/PERCUTANEOUS, EXCISION PARTIAL BONE TALUS/CANCANEUS, REPAIR PARTIAL EXCISION CALCANEUS   APPENDECTOMY     ARTERY BIOPSY     temporal - L side- wnl, double vision treated /w prednisone - 1991   CALCANEAL OSTEOTOMY  10/06/2012   Procedure: CALCANEAL OSTEOTOMY;  Surgeon: LELON JONETTA Shari Mickey., MD;  Location: [facility] OR;  Service: Orthopedics;  Laterality: Right;   CARDIAC CATHETERIZATION  12/15/2005   NORMAL. EF 60%, angioplasty 1995   carpel tunnel surgery-bilateral     both hands    CORONARY ARTERY BYPASS GRAFT  2007   LIMA GRAFT TO THE LAD, SAPHENOUS  VEIN GRAFT TO THE FIRST OBTUSE MARIGNAL, SAPHENOUS VEIN GRAFT TO THE SECOND OBTUSE MARGINAL AND A SAPHENOUS VEIN GRAFT TO THE ACUTE MARGINAL AND POSTERIOR LATERAL BRANCH   HERNIA REPAIR     I & D EXTREMITY  12/11/2012   Procedure: IRRIGATION AND DEBRIDEMENT EXTREMITY;  Surgeon: Jerona LULLA Sage, MD;  Location: [facility] OR;  Service: Orthopedics;  Laterality: Right;  Irrigation and Debridement achilles, wound closure, apply wound vac, place antibiotic beads   INGUINAL HERNIA REPAIR Left 05/10/2013   Procedure: HERNIA REPAIR INGUINAL ADULT;  Surgeon: Krystal CHRISTELLA Spinner, MD;  Location: [facility];  Service: General;  Laterality: Left;   INSERTION OF MESH Left 05/10/2013   Procedure: INSERTION OF MESH;  Surgeon: Krystal CHRISTELLA Spinner, MD;  Location: [facility];  Service: General;  Laterality: Left;   JOINT REPLACEMENT     knee  11/12   left arthroscopic   TOTAL HIP ARTHROPLASTY     right     reports that he has never smoked. He has never used smokeless tobacco. He reports that he does not drink alcohol and does not use drugs.  Allergies  Allergen Reactions   Acetaminophen      Other reaction(s): mouth sores   Amlodipine Swelling    Swelling of feet and legs   Bactrim  [Sulfamethoxazole -Trimethoprim ] Other (See Comments)    Constipation    Codeine Nausea Only   Fexofenadine     Other reaction(s): mouth sores   Ibuprofen      Other reaction(s): nervousness   Lovenox [Enoxaparin Sodium] Other (See Comments)    MAY HAVE CAUSED THROMBOCYTOPENIA   Naproxen     Other reaction(s): mouth sores   Prednisone Swelling    Patient has tolerated doses for colds. Thinks it caused swelling in the past   Shellfish Allergy Nausea And Vomiting and Other (See Comments)    N&V, shaking   Sonata  [Zaleplon ] Other (See Comments)    Hyper feelings    Family History  Problem Relation Age of Onset   Prostate cancer Brother    Heart disease Brother    Heart disease Brother     Prior to Admission medications   Medication Sig Start Date  End Date Taking? Authorizing Provider  albuterol  (VENTOLIN  HFA) 108 (90 Base) MCG/ACT inhaler Inhale 1-2 puffs into the lungs every 6 (six) hours as needed for wheezing or shortness of breath. 11/28/20   Genia Hila, MD  aspirin  81 MG tablet Take 81 mg by mouth every evening.    [provider]  BREO ELLIPTA  100-25 MCG/INH AEPB Inhale 1 puff into the lungs daily. 12/23/20   [provider]  budesonide-formoterol (SYMBICORT) 160-4.5 MCG/ACT inhaler Inhale 2 puffs into the lungs 2 (two) times daily. 04/03/24   [provider]  EPINEPHrine  0.3 mg/0.3 mL IJ SOAJ injection Inject 0.3 mg into the  muscle as needed for anaphylaxis. 11/28/20   Genia Hila, MD  fish oil-omega-3 fatty acids 1000 MG capsule Take 1 g by mouth every other day.    [provider]  furosemide  (LASIX ) 20 MG tablet Take 1 tablet (20 mg total) by mouth daily. Take 20mg  daily and take an extra dose for weight gain of 3lb in 1 day or 5lb in 1 week 06/29/24   [place], Peter M, MD  Influenza Vac High-Dose Quad (FLUZONE HIGH-DOSE QUADRIVALENT) 0.7 ML SUSY Fluzone High-Dose Quad 2020-21 (PF) 240 mcg/0.7 mL IM syringe  PHARMACY ADMINISTERED    [provider]  lovastatin (MEVACOR) 20 MG tablet Take 20 mg by mouth at bedtime.    [provider]  meclizine  (ANTIVERT ) 12.5 MG tablet Take 1 tablet (12.5 mg total) by mouth 3 (three) times daily as needed for dizziness. 01/04/23   Patt Alm Macho, MD  metoprolol  (LOPRESSOR ) 50 MG tablet Take 25 mg by mouth 2 (two) times daily.    [provider]  mirtazapine (REMERON) 15 MG tablet Take 15 mg by mouth at bedtime.    [provider]  pantoprazole  (PROTONIX ) 20 MG tablet Take 1 tablet (20 mg total) by mouth daily. 04/05/23   Emelia Josefa HERO, NP  polyethylene glycol (MIRALAX  / GLYCOLAX ) 17 g packet Take 17 g by mouth daily. 05/27/24   Fairy Frames, MD  potassium chloride  (K-DUR,KLOR-CON ) 10 MEQ tablet Take 10 mEq by mouth  every evening.     [provider]  senna-docusate (SENOKOT-S) 8.6-50 MG tablet Take 1 tablet by mouth 2 (two) times daily. 05/27/24   Fairy Frames, MD  tamsulosin  (FLOMAX ) 0.4 MG CAPS capsule Take 1 capsule (0.4 mg total) by mouth at bedtime. 09/28/23   Stoneking, Adine PARAS., MD  telmisartan  (MICARDIS ) 20 MG tablet Take 1 tablet (20 mg total) by mouth daily. 07/18/24   [place], Peter M, MD  temazepam  (RESTORIL ) 30 MG capsule Take 1 capsule by mouth at bedtime. 04/03/24   [provider]  traZODone  (DESYREL ) 50 MG tablet Take 1 tablet (50 mg total) by mouth at bedtime. 12/03/14   Neysa Rama D, MD  triazolam  (HALCION ) 0.25 MG tablet Take 0.25 mg by mouth at bedtime as needed.    [provider]  vitamin E  400 UNIT capsule Take 400 Units by mouth daily.     [provider]    Physical Exam: Vitals:   08/20/24 2230 08/20/24 2330 08/21/24 0000 08/21/24 0310  BP:  (!) 143/63    Pulse:  75    Resp:  20    Temp:  99.7 F (37.6 C)    TempSrc:  Axillary    SpO2: 97% 94%  99%  Weight:   87.4 kg   Height:   5' 5 (1.651 m)     Physical Exam Vitals reviewed.  HENT:     Head: Normocephalic and atraumatic.  Eyes:     Extraocular Movements: Extraocular movements intact.  Cardiovascular:     Rate and Rhythm: Normal rate and regular rhythm.     Pulses: Normal pulses.  Pulmonary:     Effort: Pulmonary effort is normal. No respiratory distress.     Breath sounds: No wheezing, rhonchi or rales.  Abdominal:     General: Bowel sounds are normal.     Palpations: Abdomen is soft.     Tenderness: There is no abdominal tenderness. There is no guarding.  Musculoskeletal:     Cervical back: Normal range of motion.  Right lower leg: Edema present.     Left lower leg: Edema present.     Comments: 3+ pitting edema of bilateral lower extremities  Skin:    General: Skin is warm and dry.  Neurological:     General: No focal deficit present.     Mental Status:  He is alert.     Comments: Moving all extremities on command, no focal weakness     Labs on Admission: I have personally reviewed following labs and imaging studies  CBC: Recent Labs  Lab 08/20/24 1526 08/20/24 2034 08/20/24 2232  WBC 8.4  --   --   NEUTROABS 5.7  --   --   HGB 13.2 15.0 14.3  HCT 42.2 44.0 42.0  MCV 96.3  --   --   PLT 127*  --   --    Basic Metabolic Panel: Recent Labs  Lab 08/20/24 1526 08/20/24 2034 08/20/24 2232  NA 141 140 140  K 4.9 4.4 4.6  CL 97*  --   --   CO2 29  --   --   GLUCOSE 100*  --   --   BUN 53*  --   --   CREATININE 2.78*  --   --   CALCIUM 9.6  --   --    GFR: Estimated Creatinine Clearance: 16.5 mL/min (A) (by C-G formula based on SCr of 2.78 mg/dL (H)). Liver Function Tests: Recent Labs  Lab 08/20/24 1526  AST 60*  ALT 45*  ALKPHOS 89  BILITOT 2.0*  PROT 7.0  ALBUMIN 4.2   No results for input(s): LIPASE, AMYLASE in the last 168 hours. No results for input(s): AMMONIA in the last 168 hours. Coagulation Profile: No results for input(s): INR, PROTIME in the last 168 hours. Cardiac Enzymes: No results for input(s): CKTOTAL, CKMB, CKMBINDEX, TROPONINI in the last 168 hours. BNP (last 3 results) Recent Labs    05/24/24 1702 08/20/24 1526  PROBNP 2,422.0* 14,090.0*   HbA1C: No results for input(s): HGBA1C in the last 72 hours. CBG: Recent Labs  Lab 08/20/24 2028  GLUCAP 109*   Lipid Profile: No results for input(s): CHOL, HDL, LDLCALC, TRIG, CHOLHDL, LDLDIRECT in the last 72 hours. Thyroid  Function Tests: No results for input(s): TSH, T4TOTAL, FREET4, T3FREE, THYROIDAB in the last 72 hours. Anemia Panel: No results for input(s): VITAMINB12, FOLATE, FERRITIN, TIBC, IRON, RETICCTPCT in the last 72 hours. Urine analysis:    Component Value Date/Time   COLORURINE YELLOW 05/24/2024 1930   APPEARANCEUR CLEAR 05/24/2024 1930   APPEARANCEUR Clear 09/28/2023  0912   LABSPEC 1.015 05/24/2024 1930   PHURINE 7.5 05/24/2024 1930   GLUCOSEU NEGATIVE 05/24/2024 1930   HGBUR NEGATIVE 05/24/2024 1930   BILIRUBINUR NEGATIVE 05/24/2024 1930   BILIRUBINUR Negative 09/28/2023 0912   KETONESUR NEGATIVE 05/24/2024 1930   PROTEINUR 100 (A) 05/24/2024 1930   NITRITE NEGATIVE 05/24/2024 1930   LEUKOCYTESUR NEGATIVE 05/24/2024 1930    Radiological Exams on Admission: CT Head Wo Contrast Result Date: 08/20/2024 CLINICAL DATA:  Altered mental status EXAM: CT HEAD WITHOUT CONTRAST TECHNIQUE: Contiguous axial images were obtained from the base of the skull through the vertex without intravenous contrast. RADIATION DOSE REDUCTION: This exam was performed according to the departmental dose-optimization program which includes automated exposure control, adjustment of the mA and/or kV according to patient size and/or use of iterative reconstruction technique. COMPARISON:  05/24/2024 FINDINGS: Brain: No evidence of acute infarction, hemorrhage, hydrocephalus, extra-axial collection or mass lesion/mass effect. Mild atrophic changes  are noted. Mild chronic white matter ischemic change is seen. Vascular: No hyperdense vessel or unexpected calcification. Skull: Normal. Negative for fracture or focal lesion. Sinuses/Orbits: No acute finding. Other: None. IMPRESSION: Chronic atrophic and ischemic changes without acute abnormality. Electronically Signed   By: Oneil Devonshire M.D.   On: 08/20/2024 21:07   DG Chest Portable 1 View Result Date: 08/20/2024 CLINICAL DATA:  Hypoxia EXAM: PORTABLE CHEST 1 VIEW COMPARISON:  None Available. FINDINGS: Normal mediastinum and cardiac silhouette. Midline sternotomy. Normal pulmonary vasculature. No evidence of effusion, infiltrate, or pneumothorax. No acute bony abnormality. IMPRESSION: No acute cardiopulmonary process. Electronically Signed   By: Jackquline Boxer M.D.   On: 08/20/2024 16:11    Assessment and Plan  Acute on chronic HFpEF Patient  is presenting with 3-week history of progressively worsening shortness of breath/dyspnea on exertion, cough, bilateral lower extremity edema, and weight gain.  Per daughter, he is compliant with his home Lasix  20 mg daily.  proBNP 14,090.  Chest x-ray not suggestive of pulmonary edema.  Echo done in June 2025 showing EF 60 to 65%, diastolic parameters indeterminate, RV systolic function normal, mild mitral regurgitation, trivial aortic regurgitation.  Continue diuresis with IV Lasix  40 mg twice daily.  Monitor intake and output, daily weights.  Follow-up repeat echo.  COVID-19 virus infection Spoke to pharmacist and was informed that patient is not a candidate for antiviral therapy given respiratory symptoms have been present >5 days.  Patient has been started on IV Decadron  6 mg daily.  Airborne and contact precautions.  Acute hypoxemic hypercarbic respiratory failure Oxygen saturation in the low 70s on room air.  Currently satting well on BiPAP and most recent ABG showing pH 7.28 and pCO2 80.  Chest x-ray showing no acute findings.  Does have history of COPD but no wheezing on exam. ?PE given COVID infection but not tachycardic.  Avoiding CTA due to AKI.  Check D-dimer, and if positive, obtain VQ scan in the morning.  Per review of chart, he has history of previous adverse reaction to Lovenox/heparin  products although in allergies it is listed may have caused thrombocytopenia.  Patient is currently mildly thrombocytopenic but platelet count appears stable compared to labs done 2 months ago.  If workup is concerning for PE, then there needs to be a discussion with the patient's daughter regarding starting anticoagulation.  For now, CT chest without contrast has been ordered to assess for pulmonary edema or signs of pneumonia.  Continue BiPAP and repeat ABG in 4 hours from the last one.  Troponin elevation Likely due to demand ischemia, pattern not consistent with ACS.  AKI on CKD stage II-IIIa Likely  cardiorenal in etiology in the setting of acutely decompensated CHF.  Creatinine 2.78 (baseline 1.0-1.2.  Continue IV diuresis and monitor renal function.  Avoid nephrotoxic agents/IV contrast/hold home ARB.  ?Afib EKG done in the ED showing possible A-fib but on telemetry at this time he appears to be in sinus rhythm.  It seems during his previous hospitalization in June, cardiology had ruled out A-fib.  Repeat EKG ordered.  Acute metabolic encephalopathy Likely due to hypoxemia and hypercarbia.  CT head negative for acute intracranial abnormality.  He is moving all extremities on exam, no focal weakness.  Mild elevation of LFTs Possibly due to decompensated CHF.  Monitor LFTs.   COPD No wheezing appreciated on exam.  Started on IV steroids due to COVID.  DuoNeb PRN.  Code Status: DNR/DNI (discussed with the patient's daughter) Family Communication: Daughter updated  over the phone. Level of care: Progressive Care Unit Admission status: It is my clinical opinion that admission to INPATIENT is reasonable and necessary because of the expectation that this patient will require hospital care that crosses at least 2 midnights to treat this condition based on the medical complexity of the problems presented.  Given the aforementioned information, the predictability of an adverse outcome is felt to be significant.  Editha Ram MD [facility]  If 7PM-7AM, please contact night-coverage [email or web address]  08/21/2024, 3:33 AM

## 2024-08-21 NOTE — Progress Notes (Addendum)
 Heart Failure Navigator Progress Note  Assessed for Heart & Vascular TOC clinic readiness.  Patient does not meet criteria due to last EF 60-65%, No HF TOC per Dr. Fairy, who spoke with daughter and discussed that patient is at high risk of decompensation, continue conservative management for now, if he worsens recommended transition to comfort care . Patient made DNR.  Has a scheduled [facility] appointment on 11/04/2024.   Navigator will sign off at this time.   Stephane Haddock, BSN, Scientist, clinical (histocompatibility and immunogenetics) Only

## 2024-08-21 NOTE — Progress Notes (Signed)
 Transported patient to [facility] while patient was on bipap. Patient remained stable during transport.

## 2024-08-21 NOTE — Progress Notes (Signed)
 Echocardiogram 2D Echocardiogram has been performed.  Damien FALCON Avarae Zwart RDCS 08/21/2024, 11:29 AM

## 2024-08-21 NOTE — Progress Notes (Signed)
 Patient seen and examined, admitted earlier this morning by Dr. Alfornia briefly Scott [name] is a 89/M with history of CAD/CABG, chronic diastolic CHF, CKD, COPD, OSA noncompliant with CPAP, presented to the ED with worsening swelling, weight gain, confusion.  In the emergency room noted to be hypoxic 72%, ABG noted severe hypercarbia, placed on BiPAP and diuretics, labs with creatinine of 2.7, troponin 83, proBNP 14090, SARS COVID PCR positive, CT chest with bilateral pleural effusions.  Acute on chronic hypoxic and hypercarbic respiratory failure Acute metabolic encephalopathy -Multifactorial, primarily CHF exacerbation, underlying OSA OHS with noncompliance to CPAP also contributing -Continue BiPAP today, -Increase Lasix  to 80 mg twice daily -Continue metoprolol , follow-up repeat echo - Discussed with daughter that he is at high risk of decompensation, continue conservative management for now, if he worsens recommended transition to comfort care and she understands -he is DNR  SARS COVID-19 positive - I doubt this is contributing to his symptoms, CT findings primarily with pleural effusions - Continue Decadron  for now  Rest as noted by Dr. Alfornia this morning  Scott Pac, MD

## 2024-08-21 NOTE — TOC CM/SW Note (Signed)
 Transition of Care [facility]) - Inpatient Brief Assessment   Patient Details  Name: Scott [name] MRN: [id number] Date of Birth: 07-Dec-1927  Transition of Care [facility]) CM/SW Contact:    Waddell Barnie Rama, RN Phone Number: 08/21/2024, 9:53 AM   Clinical Narrative: From home with daughter and her spouse, per designated party can speak with daughter and son, has PCP and insurance on file, states has no HH services in place at this time has walker  at home.  States family member will transport them home at [employer] and family is support system, states gets medications from [facility] in [place].  Pta self ambulatory with walker.   On Bipap, IV lasix .   Transition of Care Asessment: Insurance and Status: Insurance coverage has been reviewed Patient has primary care physician: Yes Home environment has been reviewed: from home with daughter Prior level of function:: ambulatory with walker Prior/Current Home Services: Current home services (walker) Social Drivers of Health Review: SDOH reviewed no interventions necessary Readmission risk has been reviewed: Yes Transition of care needs: no transition of care needs at this time

## 2024-08-22 DIAGNOSIS — I509 Heart failure, unspecified: Secondary | ICD-10-CM | POA: Diagnosis not present

## 2024-08-22 LAB — BLOOD GAS, ARTERIAL
Acid-Base Excess: 7.2 mmol/L — ABNORMAL HIGH (ref 0.0–2.0)
Acid-Base Excess: 12.5 mmol/L — ABNORMAL HIGH (ref 0.0–2.0)
Bicarbonate: 38.8 mmol/L — ABNORMAL HIGH (ref 20.0–28.0)
Bicarbonate: 40.8 mmol/L — ABNORMAL HIGH (ref 20.0–28.0)
Drawn by: 137461
Drawn by: 336832
O2 Saturation: 98.3 %
O2 Saturation: 99.9 %
Patient temperature: 37.2
Patient temperature: 37
pCO2 arterial: 98 mmHg (ref 32–48)
pCO2 arterial: 69 mmHg (ref 32–48)
pH, Arterial: 7.21 — ABNORMAL LOW (ref 7.35–7.45)
pH, Arterial: 7.38 (ref 7.35–7.45)
pO2, Arterial: 101 mmHg (ref 83–108)
pO2, Arterial: 166 mmHg — ABNORMAL HIGH (ref 83–108)

## 2024-08-22 LAB — COMPREHENSIVE METABOLIC PANEL WITH GFR
ALT: 39 U/L (ref 0–44)
AST: 41 U/L (ref 15–41)
Albumin: 3.6 g/dL (ref 3.5–5.0)
Alkaline Phosphatase: 61 U/L (ref 38–126)
Anion gap: 12 (ref 5–15)
BUN: 51 mg/dL — ABNORMAL HIGH (ref 8–23)
CO2: 35 mmol/L — ABNORMAL HIGH (ref 22–32)
Calcium: 8.9 mg/dL (ref 8.9–10.3)
Chloride: 97 mmol/L — ABNORMAL LOW (ref 98–111)
Creatinine, Ser: 1.91 mg/dL — ABNORMAL HIGH (ref 0.61–1.24)
GFR, Estimated: 32 mL/min — ABNORMAL LOW (ref >60)
Glucose, Bld: 108 mg/dL — ABNORMAL HIGH (ref 70–99)
Potassium: 5.1 mmol/L (ref 3.5–5.1)
Sodium: 144 mmol/L (ref 135–145)
Total Bilirubin: 1.9 mg/dL — ABNORMAL HIGH (ref 0.0–1.2)
Total Protein: 6.7 g/dL (ref 6.5–8.1)

## 2024-08-22 LAB — CBC
HCT: 44.5 % (ref 39.0–52.0)
Hemoglobin: 12.9 g/dL — ABNORMAL LOW (ref 13.0–17.0)
MCH: 29.7 pg (ref 26.0–34.0)
MCHC: 29 g/dL — ABNORMAL LOW (ref 30.0–36.0)
MCV: 102.3 fL — ABNORMAL HIGH (ref 80.0–100.0)
Platelets: 148 K/uL — ABNORMAL LOW (ref 150–400)
RBC: 4.35 MIL/uL (ref 4.22–5.81)
RDW: 17.1 % — ABNORMAL HIGH (ref 11.5–15.5)
WBC: 8.6 K/uL (ref 4.0–10.5)
nRBC: 0.5 % — ABNORMAL HIGH (ref 0.0–0.2)

## 2024-08-22 LAB — FERRITIN: Ferritin: 17 ng/mL — ABNORMAL LOW (ref 24–336)

## 2024-08-22 LAB — C-REACTIVE PROTEIN: CRP: 4.2 mg/dL — ABNORMAL HIGH (ref <1.0)

## 2024-08-22 MED ORDER — DEXAMETHASONE 6 MG PO TABS
6.0000 mg | ORAL_TABLET | Freq: Every day | ORAL | Status: AC
  Administered 2024-08-23 – 2024-08-27 (×5): 6 mg via ORAL
  Filled 2024-08-22 (×5): qty 1

## 2024-08-22 MED ORDER — TAMSULOSIN HCL 0.4 MG PO CAPS
0.4000 mg | ORAL_CAPSULE | Freq: Every day | ORAL | Status: DC
  Administered 2024-08-22 – 2024-08-31 (×9): 0.4 mg via ORAL
  Filled 2024-08-22 (×10): qty 1

## 2024-08-22 NOTE — Progress Notes (Signed)
 PT placed on BIPAP at this time.   08/22/24 2154  BiPAP/CPAP/SIPAP  BiPAP/CPAP/SIPAP Pt Type Adult  BiPAP/CPAP/SIPAP SERVO  Mask Type Full face mask  Dentures removed? Not applicable  Mask Size Large  Set Rate 18 breaths/min  Respiratory Rate 29 breaths/min  IPAP 20 cmH20  EPAP 5 cmH2O  Pressure Support 15 cmH20  PEEP 5 cmH20  FiO2 (%) 35 %  Minute Ventilation 11.8  Leak 49  Peak Inspiratory Pressure (PIP) 22  Tidal Volume (Vt) 581  Patient Home Machine No  Patient Home Mask No  Patient Home Tubing No  Auto Titrate No  Press High Alarm 30 cmH2O  Press Low Alarm 5 cmH2O  Nasal massage performed Yes  CPAP/SIPAP surface wiped down Yes  Device Plugged into RED Power Outlet Yes  Oxygen Percent 35 %

## 2024-08-22 NOTE — Evaluation (Signed)
 Occupational Therapy Evaluation Patient Details Name: Scott [name] MRN: [id number] DOB: 26-Jan-1928 Today's Date: 08/22/2024   History of Present Illness   Pt is a 89 y.o. male admitted 9/15 for DOE and bil pitting edema. Found to have COVID-19 and CHF exacerbation. PMH: CAD, HFpEF, HTN, OSA     Clinical Impressions Pt admitted based on above, and was seen based on problem list below. PTA pt was living with his daughter, and reports mostly mod I with ADLs and mobility with use of RW. Today pt is requiring set up  to min assist for ADLs. Functional transfers are CGA to  min assist with use of RW. Noted pt with decreased cog, poor problem solving , attention, stm and sequencing skills. Currently pt is below functional baseline and would benefit from <3 hours of skilled rehab daily. Pt reported having 24/7 assistance and supervision available at home. If pt and family choose to, pt can d/c with HHOT to improve balance and safety within the home. OT will continue to follow acutely to maximize functional independence.        If plan is discharge home, recommend the following:   A little help with walking and/or transfers;A little help with bathing/dressing/bathroom;Help with stairs or ramp for entrance;Supervision due to cognitive status;Assistance with cooking/housework     Functional Status Assessment   Patient has had a recent decline in their functional status and demonstrates the ability to make significant improvements in function in a reasonable and predictable amount of time.     Equipment Recommendations   BSC/3in1      Precautions/Restrictions   Precautions Precautions: Fall Recall of Precautions/Restrictions: Impaired Restrictions Weight Bearing Restrictions Per Provider Order: No     Mobility Bed Mobility Overal bed mobility: Needs Assistance Bed Mobility: Supine to Sit     Supine to sit: Supervision, Used rails, HOB elevated     General bed mobility  comments: S for safety with increased time and effort, heavy reliance on bed rails    Transfers Overall transfer level: Needs assistance Equipment used: Rolling walker (2 wheels) Transfers: Sit to/from Stand, Bed to chair/wheelchair/BSC Sit to Stand: Contact guard assist     Step pivot transfers: Contact guard assist, Min assist     General transfer comment: CGA to come to stand, once in standing, pt CGA for short step pivot min assist to sequence and manage turns      Balance Overall balance assessment: Needs assistance Sitting-balance support: No upper extremity supported, Feet supported Sitting balance-Leahy Scale: Fair     Standing balance support: Bilateral upper extremity supported, During functional activity, Reliant on assistive device for balance Standing balance-Leahy Scale: Poor Standing balance comment: Reliant on RW         ADL either performed or assessed with clinical judgement   ADL Overall ADL's : Needs assistance/impaired Eating/Feeding: Set up;Sitting   Grooming: Set up;Sitting   Upper Body Bathing: Set up;Sitting   Lower Body Bathing: Minimal assistance;Sit to/from stand   Upper Body Dressing : Set up;Sitting   Lower Body Dressing: Minimal assistance;Sit to/from stand   Toilet Transfer: Contact guard assist;Rolling walker (2 wheels);Stand-pivot Statistician Details (indicate cue type and reason): Short step pivot simulated in room         Functional mobility during ADLs: Contact guard assist;Rolling walker (2 wheels) General ADL Comments: Decreased balance, uses AE at baseline for LB ADLs     Vision Baseline Vision/History: 1 Wears glasses Patient Visual Report: No change from baseline Vision Assessment?:  No apparent visual deficits            Pertinent Vitals/Pain Pain Assessment Pain Assessment: No/denies pain     Extremity/Trunk Assessment Upper Extremity Assessment Upper Extremity Assessment: Generalized weakness   Lower  Extremity Assessment Lower Extremity Assessment: Defer to PT evaluation       Communication Communication Communication: No apparent difficulties   Cognition Arousal: Alert Behavior During Therapy: WFL for tasks assessed/performed Cognition: Cognition impaired   Orientation impairments: Situation Awareness: Online awareness impaired Memory impairment (select all impairments): Short-term memory Attention impairment (select first level of impairment): Sustained attention Executive functioning impairment (select all impairments): Problem solving, Sequencing OT - Cognition Comments: Difficulty sequencing tasks, only following simple commands with increased time     Following commands: Impaired Following commands impaired: Follows one step commands with increased time     Cueing  General Comments   Cueing Techniques: Verbal cues  Session conducted on 2L, pt O2 stats sustaining 94% or greater           Home Living Family/patient expects to be discharged to:: Private residence Living Arrangements: Children Available Help at Discharge: Family;Available 24 hours/day Type of Home: House Home Access: Stairs to enter [employer] of Steps: 2 Entrance Stairs-Rails: None Home Layout: One level     Bathroom Shower/Tub: Producer, television/film/video: Standard Bathroom Accessibility: Yes How Accessible: Accessible via walker Home Equipment: Other (comment);Rolling Walker (2 wheels);Cane - single point;Grab bars - tub/shower;Adaptive equipment;Shower seat (3 wheeled walker) Adaptive Equipment: Reacher;Sock aid;Long-handled shoe horn;Other (Comment) (Dressing stick)        Prior Functioning/Environment Prior Level of Function : Independent/Modified Independent;Driving       Mobility Comments: Mod I with use of RW ADLs Comments: Reports mostly mod I with use of AE to assist in LB dressing.    OT Problem List: Decreased strength;Decreased activity  tolerance;Impaired balance (sitting and/or standing);Decreased cognition;Decreased safety awareness;Cardiopulmonary status limiting activity   OT Treatment/Interventions: Therapeutic exercise;Self-care/ADL training;Energy conservation;DME and/or AE instruction;Therapeutic activities;Patient/family education;Balance training      OT Goals(Current goals can be found in the care plan section)   Acute Rehab OT Goals Patient Stated Goal: To take a nap OT Goal Formulation: With patient Time For Goal Achievement: 09/05/24 Potential to Achieve Goals: Good   OT Frequency:  Min 2X/week    AM-PAC OT 6 Clicks Daily Activity     Outcome Measure Help from another person eating meals?: None Help from another person taking care of personal grooming?: A Little Help from another person toileting, which includes using toliet, bedpan, or urinal?: A Little Help from another person bathing (including washing, rinsing, drying)?: A Little Help from another person to put on and taking off regular upper body clothing?: A Little Help from another person to put on and taking off regular lower body clothing?: A Little 6 Click Score: 19   End of Session Equipment Utilized During Treatment: Gait belt;Rolling walker (2 wheels);Oxygen Nurse Communication: Mobility status  Activity Tolerance: Patient tolerated treatment well Patient left: in chair;with call bell/phone within reach;with chair alarm set;with nursing/sitter in room  OT Visit Diagnosis: Unsteadiness on feet (R26.81);Other abnormalities of gait and mobility (R26.89);Muscle weakness (generalized) (M62.81)                Time: 8652-8585 OT Time Calculation (min): 27 min Charges:  OT General Charges $OT Visit: 1 Visit OT Evaluation $OT Eval Moderate Complexity: 1 Mod OT Treatments $Self Care/Home Management : 8-22 mins  Shalayna Ornstein C, OT  Acute Rehabilitation Services Office [phone] Secure chat preferred   Adrianne GORMAN Savers 08/22/2024,  2:31 PM

## 2024-08-22 NOTE — Progress Notes (Addendum)
 PROGRESS NOTE    Scott [name]  FMW:[id number] DOB: [birth year] DOA: 08/20/2024 PCP: Nanci Senior, MD    89/M with history of CAD/CABG, chronic diastolic CHF, CKD, COPD, OSA noncompliant with CPAP, 89 presented to the ED with worsening swelling, weight gain, confusion.  In the emergency room noted to be hypoxic 72%, ABG noted severe hypercarbia, placed on BiPAP and diuretics, labs with creatinine of 2.7, troponin 83, proBNP 14090, SARS COVID PCR positive, CT chest with bilateral pleural effusions. - 9/16, persistent hypercarbia and encephalopathy, remained on BiPAP   Subjective: -Mental status improving, awake today  Assessment and Plan:  Acute on chronic hypoxic and hypercarbic respiratory failure Acute metabolic encephalopathy -Multifactorial, primarily CHF exacerbation, underlying OSA OHS with noncompliance to CPAP also contributing - Improving, weaned off BiPAP this morning, changed to nightly CPAP -Continue Lasix  80mg  BID, metoprolol  -repeat echo with preserved EF, moderately reduced RV, severely elevated PASP - Discussed with daughter yesterday, plan for conservative management - Increase activity, PT OT eval  Acute on chronic diastolic CHF RV failure, pulmonary hypertension -Echo and diuretics as noted above, conservative management   SARS COVID-19 positive - I doubt this is contributing to his symptoms, CT findings primarily with pleural effusions - Continue Decadron  for now, changed to p.o.  Elevated troponin Secondary to demand ischemia, no ACS  AKI on CKD 3 A - Cardiorenal, improving with diuresis, creatinine 2.7 on admission from baseline of 1.2 now down to 1.9, monitor  Abnormal LFTs - Secondary to hepatic congestion from decompensated CHF, monitor  COPD - Continue nebs   DVT prophylaxis: Add Lovenox Code Status: DNR Family Communication: No family at bedside, called and updated daughter yesterday Disposition Plan: To be determined  Consultants:     Procedures:   Antimicrobials:    Objective: Vitals:   08/22/24 0728 08/22/24 0745 08/22/24 0852 08/22/24 0857  BP: 94/77   115/63  Pulse: 83 86    Resp:  18    Temp:      TempSrc:   Oral   SpO2:  100% (!) 89%   Weight:      Height:        Intake/Output Summary (Last 24 hours) at 08/22/2024 1212 Last data filed at 08/22/2024 0500 Gross per 24 hour  Intake --  Output 700 ml  Net -700 ml   Filed Weights   08/21/24 0000 08/21/24 0500 08/22/24 0500  Weight: 87.4 kg 87.4 kg 84 kg    Examination:  General exam: Fairly frail chronically ill, awake and alert today, oriented to self and partly to place HEENT: Positive JVD Respiratory system: Decreased breath sounds at the bases, worse on the left Cardiovascular system: S1 & S2 heard, RRR.  Abd: nondistended, soft and nontender.Normal bowel sounds heard. Central nervous system: Alert and oriented. No focal neurological deficits. Extremities: 1-2+ edema Skin: No rashes Psychiatry:  Mood & affect appropriate.     Data Reviewed:   CBC: Recent Labs  Lab 08/20/24 1526 08/20/24 2034 08/20/24 2232 08/21/24 0302 08/22/24 0221  WBC 8.4  --   --  10.2 8.6  NEUTROABS 5.7  --   --   --   --   HGB 13.2 15.0 14.3 12.9* 12.9*  HCT 42.2 44.0 42.0 43.2 44.5  MCV 96.3  --   --  100.5* 102.3*  PLT 127*  --   --  120* 148*   Basic Metabolic Panel: Recent Labs  Lab 08/20/24 1526 08/20/24 2034 08/20/24 2232 08/21/24 0302 08/22/24 0221  NA 141 140  140 141 144  K 4.9 4.4 4.6 4.5 5.1  CL 97*  --   --  99 97*  CO2 29  --   --  28 35*  GLUCOSE 100*  --   --  83 108*  BUN 53*  --   --  53* 51*  CREATININE 2.78*  --   --  2.41* 1.91*  CALCIUM 9.6  --   --  8.6* 8.9   GFR: Estimated Creatinine Clearance: 23.6 mL/min (A) (by C-G formula based on SCr of 1.91 mg/dL (H)). Liver Function Tests: Recent Labs  Lab 08/20/24 1526 08/21/24 0302 08/22/24 0221  AST 60* 49* 41  ALT 45* 41 39  ALKPHOS 89 66 61  BILITOT 2.0* 1.8*  1.9*  PROT 7.0 6.5 6.7  ALBUMIN 4.2 3.7 3.6   No results for input(s): LIPASE, AMYLASE in the last 168 hours. No results for input(s): AMMONIA in the last 168 hours. Coagulation Profile: No results for input(s): INR, PROTIME in the last 168 hours. Cardiac Enzymes: No results for input(s): CKTOTAL, CKMB, CKMBINDEX, TROPONINI in the last 168 hours. BNP (last 3 results) Recent Labs    05/24/24 1702 08/20/24 1526  PROBNP 2,422.0* 14,090.0*   HbA1C: No results for input(s): HGBA1C in the last 72 hours. CBG: Recent Labs  Lab 08/20/24 2028  GLUCAP 109*   Lipid Profile: No results for input(s): CHOL, HDL, LDLCALC, TRIG, CHOLHDL, LDLDIRECT in the last 72 hours. Thyroid  Function Tests: No results for input(s): TSH, T4TOTAL, FREET4, T3FREE, THYROIDAB in the last 72 hours. Anemia Panel: Recent Labs    08/22/24 0221  FERRITIN 17*   Urine analysis:    Component Value Date/Time   COLORURINE YELLOW 05/24/2024 1930   APPEARANCEUR CLEAR 05/24/2024 1930   APPEARANCEUR Clear 09/28/2023 0912   LABSPEC 1.015 05/24/2024 1930   PHURINE 7.5 05/24/2024 1930   GLUCOSEU NEGATIVE 05/24/2024 1930   HGBUR NEGATIVE 05/24/2024 1930   BILIRUBINUR NEGATIVE 05/24/2024 1930   BILIRUBINUR Negative 09/28/2023 0912   KETONESUR NEGATIVE 05/24/2024 1930   PROTEINUR 100 (A) 05/24/2024 1930   NITRITE NEGATIVE 05/24/2024 1930   LEUKOCYTESUR NEGATIVE 05/24/2024 1930   Sepsis Labs: @LABRCNTIP (procalcitonin:4,lacticidven:4)  ) Recent Results (from the past 240 hours)  Resp panel by RT-PCR (RSV, Flu A&B, Covid) Anterior Nasal Swab     Status: Abnormal   Collection Time: 08/20/24  3:26 PM   Specimen: Anterior Nasal Swab  Result Value Ref Range Status   SARS Coronavirus 2 by RT PCR POSITIVE (A) NEGATIVE Final    Comment: (NOTE) SARS-CoV-2 target nucleic acids are DETECTED.  The SARS-CoV-2 RNA is generally detectable in upper respiratory specimens during the  acute phase of infection. Positive results are indicative of the presence of the identified virus, but do not rule out bacterial infection or co-infection with other pathogens not detected by the test. Clinical correlation with patient history and other diagnostic information is necessary to determine patient infection status. The expected result is Negative.  Fact Sheet for Patients: BloggerCourse.com  Fact Sheet for Healthcare Providers: SeriousBroker.it  This test is not yet approved or cleared by the [place]  FDA and  has been authorized for detection and/or diagnosis of SARS-CoV-2 by FDA under an Emergency Use Authorization (EUA).  This EUA will remain in effect (meaning this test can be used) for the duration of  the COVID-19 declaration under Section 564(b)(1) of the A ct, 21 U.S.C. section 360bbb-3(b)(1), unless the authorization is terminated or revoked sooner.     Influenza  A by PCR NEGATIVE NEGATIVE Final   Influenza B by PCR NEGATIVE NEGATIVE Final    Comment: (NOTE) The Xpert Xpress SARS-CoV-2/FLU/RSV plus assay is intended as an aid in the diagnosis of influenza from Nasopharyngeal swab specimens and should not be used as a sole basis for treatment. Nasal washings and aspirates are unacceptable for Xpert Xpress SARS-CoV-2/FLU/RSV testing.  Fact Sheet for Patients: BloggerCourse.com  Fact Sheet for Healthcare Providers: SeriousBroker.it  This test is not yet approved or cleared by the [place]  FDA and has been authorized for detection and/or diagnosis of SARS-CoV-2 by FDA under an Emergency Use Authorization (EUA). This EUA will remain in effect (meaning this test can be used) for the duration of the COVID-19 declaration under Section 564(b)(1) of the Act, 21 U.S.C. section 360bbb-3(b)(1), unless the authorization is terminated or revoked.     Resp  Syncytial Virus by PCR NEGATIVE NEGATIVE Final    Comment: (NOTE) Fact Sheet for Patients: BloggerCourse.com  Fact Sheet for Healthcare Providers: SeriousBroker.it  This test is not yet approved or cleared by the [place]  FDA and has been authorized for detection and/or diagnosis of SARS-CoV-2 by FDA under an Emergency Use Authorization (EUA). This EUA will remain in effect (meaning this test can be used) for the duration of the COVID-19 declaration under Section 564(b)(1) of the Act, 21 U.S.C. section 360bbb-3(b)(1), unless the authorization is terminated or revoked.  Performed at [facility], [address], [place], [place] [address]      Radiology Studies: ECHOCARDIOGRAM LIMITED Result Date: 08/21/2024    ECHOCARDIOGRAM LIMITED REPORT   Patient Name:   CHUKWUKA FESTA Date of Exam: 08/21/2024 Medical Rec #:  [id number]       Height:       65.0 in Accession #:    [id number]      Weight:       192.7 lb Date of Birth:  11-Dec-1927       BSA:          1.947 m Patient Age:    89 years        BP:           107/45 mmHg Patient Gender: M               HR:           80 bpm. Exam Location:  Inpatient Procedure: Limited Echo, Color Doppler and Cardiac Doppler (Both Spectral and            Color Flow Doppler were utilized during procedure). Indications:    I50.31 Acute diastolic (congestive) heart failure  History:        Patient has prior history of Echocardiogram examinations, most                 recent 05/25/2024. CHF; Risk Factors:Hypertension, Dyslipidemia                 and Sleep Apnea.  Sonographer:    Damien Senior RDCS Referring Phys: [phone] SUBRINA SUNDIL  Sonographer Comments: COVID+ at time of study IMPRESSIONS  1. Left ventricular ejection fraction, by estimation, is 60 to 65%. The left ventricle has normal function.  2. Right ventricular systolic function is moderately reduced. The right ventricular size is  moderately enlarged. There is severely elevated pulmonary artery systolic pressure. The estimated right ventricular systolic pressure is 69.2 mmHg.  3. The mitral valve is normal in structure. Mild mitral valve regurgitation.  4. Tricuspid valve regurgitation is moderate to  severe.  5. The aortic valve is tricuspid. Aortic valve regurgitation is not visualized. Aortic valve sclerosis is present, with no evidence of aortic valve stenosis.  6. The inferior vena cava is dilated in size with <50% respiratory variability, suggesting right atrial pressure of 15 mmHg. FINDINGS  Left Ventricle: Left ventricular ejection fraction, by estimation, is 60 to 65%. The left ventricle has normal function. Right Ventricle: The right ventricular size is moderately enlarged. Right ventricular systolic function is moderately reduced. There is severely elevated pulmonary artery systolic pressure. The tricuspid regurgitant velocity is 3.68 m/s, and with an assumed right atrial pressure of 15 mmHg, the estimated right ventricular systolic pressure is 69.2 mmHg. Mitral Valve: The mitral valve is normal in structure. Mild mitral valve regurgitation. Tricuspid Valve: The tricuspid valve is normal in structure. Tricuspid valve regurgitation is moderate to severe. Aortic Valve: The aortic valve is tricuspid. Aortic valve regurgitation is not visualized. Aortic valve sclerosis is present, with no evidence of aortic valve stenosis. Venous: The inferior vena cava is dilated in size with less than 50% respiratory variability, suggesting right atrial pressure of 15 mmHg. Additional Comments: Spectral Doppler performed. Color Doppler performed.  LEFT VENTRICLE PLAX 2D LVOT diam:     1.90 cm LV SV:         53 LV SV Index:   27 LVOT Area:     2.84 cm  RIGHT VENTRICLE RV S prime:     7.51 cm/s TAPSE (M-mode): 1.1 cm AORTIC VALVE LVOT Vmax:   86.20 cm/s LVOT Vmean:  63.100 cm/s LVOT VTI:    0.188 m TRICUSPID VALVE TR Peak grad:   54.2 mmHg TR Vmax:         368.00 cm/s  SHUNTS Systemic VTI:  0.19 m Systemic Diam: 1.90 cm Aditya Sabharwal Electronically signed by Ria Commander Signature Date/Time: 08/21/2024/4:23:22 PM    Final    CT CHEST WO CONTRAST Result Date: 08/21/2024 CLINICAL DATA:  Respiratory illness. EXAM: CT CHEST WITHOUT CONTRAST TECHNIQUE: Multidetector CT imaging of the chest was performed following the standard protocol without IV contrast. RADIATION DOSE REDUCTION: This exam was performed according to the departmental dose-optimization program which includes automated exposure control, adjustment of the mA and/or kV according to patient size and/or use of iterative reconstruction technique. COMPARISON:  05/24/2024 FINDINGS: Patient was scanned in a right-side-down decubitus position. Cardiovascular: Mild cardiomegaly. No substantial pericardial effusion. Coronary artery calcification is evident. Moderate atherosclerotic calcification is noted in the wall of the thoracic aorta. Status post CABG. Ascending thoracic aorta measures 4.3 cm diameter. Enlargement of the pulmonary outflow tract/main pulmonary arteries suggests pulmonary arterial hypertension. Mediastinum/Nodes: No mediastinal lymphadenopathy. No evidence for gross hilar lymphadenopathy although assessment is limited by the lack of intravenous contrast on the current study. The esophagus has normal imaging features. There is no axillary lymphadenopathy. Lungs/Pleura: Fine detail obscured by substantial breathing motion during image acquisition. Moderate right and small left dependent pleural effusions evident. Dependent atelectasis noted in the lateral right lung. No dense consolidative airspace disease. No suspicious pulmonary nodule or mass. Upper Abdomen: Visualized portion of the upper abdomen shows no acute findings. Musculoskeletal: Body wall edema evident. Degenerative changes are noted in the right shoulder. IMPRESSION: 1. Moderate right and small left dependent pleural effusions  with dependent atelectasis in the lateral right lung. 2. 4.3 cm diameter ascending thoracic aorta. Recommend annual imaging followup by CTA or MRA. This recommendation follows 2010 ACCF/AHA/AATS/ACR/ASA/SCA/SCAI/SIR/STS/SVM Guidelines for the Diagnosis and Management of Patients with Thoracic Aortic Disease.  Circulation. 2010; 121: Z733-z630. Aortic aneurysm NOS (ICD10-I71.9) 3. Enlargement of the pulmonary outflow tract/main pulmonary arteries suggests pulmonary arterial hypertension. 4. Mild body wall edema. 5.  Aortic Atherosclerosis (ICD10-I70.0). Electronically Signed   By: Camellia Candle M.D.   On: 08/21/2024 05:54   CT Head Wo Contrast Result Date: 08/20/2024 CLINICAL DATA:  Altered mental status EXAM: CT HEAD WITHOUT CONTRAST TECHNIQUE: Contiguous axial images were obtained from the base of the skull through the vertex without intravenous contrast. RADIATION DOSE REDUCTION: This exam was performed according to the departmental dose-optimization program which includes automated exposure control, adjustment of the mA and/or kV according to patient size and/or use of iterative reconstruction technique. COMPARISON:  05/24/2024 FINDINGS: Brain: No evidence of acute infarction, hemorrhage, hydrocephalus, extra-axial collection or mass lesion/mass effect. Mild atrophic changes are noted. Mild chronic white matter ischemic change is seen. Vascular: No hyperdense vessel or unexpected calcification. Skull: Normal. Negative for fracture or focal lesion. Sinuses/Orbits: No acute finding. Other: None. IMPRESSION: Chronic atrophic and ischemic changes without acute abnormality. Electronically Signed   By: Oneil Devonshire M.D.   On: 08/20/2024 21:07   DG Chest Portable 1 View Result Date: 08/20/2024 CLINICAL DATA:  Hypoxia EXAM: PORTABLE CHEST 1 VIEW COMPARISON:  None Available. FINDINGS: Normal mediastinum and cardiac silhouette. Midline sternotomy. Normal pulmonary vasculature. No evidence of effusion, infiltrate, or  pneumothorax. No acute bony abnormality. IMPRESSION: No acute cardiopulmonary process. Electronically Signed   By: Jackquline Boxer M.D.   On: 08/20/2024 16:11     Scheduled Meds:  dexamethasone  (DECADRON ) injection  6 mg Intravenous Q24H   furosemide   80 mg Intravenous BID   metoprolol  tartrate  25 mg Oral BID   tamsulosin   0.4 mg Oral Daily   Continuous Infusions:   LOS: 2 days    Time spent:    Sigurd Pac, MD [facility]   08/22/2024, 12:12 PM

## 2024-08-23 DIAGNOSIS — I509 Heart failure, unspecified: Secondary | ICD-10-CM | POA: Diagnosis not present

## 2024-08-23 DIAGNOSIS — U071 COVID-19: Secondary | ICD-10-CM | POA: Diagnosis not present

## 2024-08-23 LAB — CBC
HCT: 36 % — ABNORMAL LOW (ref 39.0–52.0)
Hemoglobin: 10.8 g/dL — ABNORMAL LOW (ref 13.0–17.0)
MCH: 29.9 pg (ref 26.0–34.0)
MCHC: 30 g/dL (ref 30.0–36.0)
MCV: 99.7 fL (ref 80.0–100.0)
Platelets: 111 K/uL — ABNORMAL LOW (ref 150–400)
RBC: 3.61 MIL/uL — ABNORMAL LOW (ref 4.22–5.81)
RDW: 17 % — ABNORMAL HIGH (ref 11.5–15.5)
WBC: 10.1 K/uL (ref 4.0–10.5)
nRBC: 0.3 % — ABNORMAL HIGH (ref 0.0–0.2)

## 2024-08-23 LAB — BASIC METABOLIC PANEL WITH GFR
Anion gap: 11 (ref 5–15)
BUN: 68 mg/dL — ABNORMAL HIGH (ref 8–23)
CO2: 36 mmol/L — ABNORMAL HIGH (ref 22–32)
Calcium: 8.5 mg/dL — ABNORMAL LOW (ref 8.9–10.3)
Chloride: 90 mmol/L — ABNORMAL LOW (ref 98–111)
Creatinine, Ser: 2.87 mg/dL — ABNORMAL HIGH (ref 0.61–1.24)
GFR, Estimated: 20 mL/min — ABNORMAL LOW (ref >60)
Glucose, Bld: 141 mg/dL — ABNORMAL HIGH (ref 70–99)
Potassium: 4.5 mmol/L (ref 3.5–5.1)
Sodium: 137 mmol/L (ref 135–145)

## 2024-08-23 MED ORDER — POLYETHYLENE GLYCOL 3350 17 G PO PACK
17.0000 g | PACK | Freq: Every day | ORAL | Status: DC
  Administered 2024-08-23 – 2024-08-28 (×6): 17 g via ORAL
  Filled 2024-08-23 (×6): qty 1

## 2024-08-23 MED ORDER — FUROSEMIDE 10 MG/ML IJ SOLN: 40.0000 mg | Freq: Two times a day (BID) | INTRAMUSCULAR | Status: DC

## 2024-08-23 NOTE — Progress Notes (Signed)
 PROGRESS NOTE    Scott [name]  FMW:[id number] DOB: [birth year] DOA: 08/20/2024 PCP: Nanci Senior, MD    89/M with history of CAD/CABG, chronic diastolic CHF, CKD, COPD, OSA noncompliant with CPAP, presented to the ED with worsening swelling, weight gain, confusion.  In the emergency room noted to be hypoxic 72%, ABG noted severe hypercarbia, placed on BiPAP and diuretics, labs with creatinine of 2.7, troponin 83, proBNP 14090, SARS COVID PCR positive, CT chest with bilateral pleural effusions. - 9/16, persistent hypercarbia and encephalopathy, remained on BiPAP   Subjective: Awake but confused per family  Assessment and Plan:  Acute on chronic hypoxic and hypercarbic respiratory failure Acute metabolic encephalopathy -Multifactorial, primarily CHF exacerbation, underlying OSA OHS with noncompliance to CPAP also contributing - nightly CPAP - Blood pressure is not tolerating diuresis with IV Lasix  twice daily so will decrease dose -repeat echo with preserved EF, moderately reduced RV, severely elevated PASP - Discussed with daughter yesterday, plan for conservative management-if worsens may need transition to comfort care - Increase activity, PT OT eval-home health versus SNF  Acute on chronic diastolic CHF RV failure, pulmonary hypertension -Echo and diuretics as noted above, conservative management   SARS COVID-19 positive - Dr. Fairy doubts this is contributing to his symptoms, CT findings primarily with pleural effusions - Decadron  for now, changed to p.o.  Elevated troponin Secondary to demand ischemia, no ACS  AKI on CKD 3 A - Cardiorenal, improving with diuresis, creatinine 2.7 on admission  - Initially dropped but now trending up so we will hold Lasix  for today  Abnormal LFTs - Appears resolved  COPD - Continue nebs   DVT prophylaxis:  Lovenox Code Status: DNR Family Communication: Called daughter Disposition Plan: To be  determined     Objective: Vitals:   08/23/24 0845 08/23/24 1005 08/23/24 1154 08/23/24 1200  BP: (!) 96/46 (!) 112/55 (!) 89/56 (!) 90/51  Pulse: (!) 117 86 75 73  Resp: 20 16 (!) 21 16  Temp:   98.2 F (36.8 C)   TempSrc:      SpO2: 99% 96% 94% 95%  Weight:      Height:        Intake/Output Summary (Last 24 hours) at 08/23/2024 1332 Last data filed at 08/23/2024 1255 Gross per 24 hour  Intake 480 ml  Output 300 ml  Net 180 ml   Filed Weights   08/21/24 0500 08/22/24 0500 08/23/24 0500  Weight: 87.4 kg 84 kg 87.9 kg    Examination:   General: Appearance:    Obese male in no acute distress     Lungs:      respirations unlabored  Heart:    Normal heart rate. Normal rhythm. No murmurs, rubs, or gallops.   MS:   All extremities are intact.   Neurologic: Awake, pleasant and cooperative       Data Reviewed:   CBC: Recent Labs  Lab 08/20/24 1526 08/20/24 2034 08/20/24 2232 08/21/24 0302 08/22/24 0221 08/23/24 0226  WBC 8.4  --   --  10.2 8.6 10.1  NEUTROABS 5.7  --   --   --   --   --   HGB 13.2 15.0 14.3 12.9* 12.9* 10.8*  HCT 42.2 44.0 42.0 43.2 44.5 36.0*  MCV 96.3  --   --  100.5* 102.3* 99.7  PLT 127*  --   --  120* 148* 111*   Basic Metabolic Panel: Recent Labs  Lab 08/20/24 1526 08/20/24 2034 08/20/24 2232 08/21/24 0302 08/22/24 0221  08/23/24 0226  NA 141 140 140 141 144 137  K 4.9 4.4 4.6 4.5 5.1 4.5  CL 97*  --   --  99 97* 90*  CO2 29  --   --  28 35* 36*  GLUCOSE 100*  --   --  83 108* 141*  BUN 53*  --   --  53* 51* 68*  CREATININE 2.78*  --   --  2.41* 1.91* 2.87*  CALCIUM 9.6  --   --  8.6* 8.9 8.5*   GFR: Estimated Creatinine Clearance: 16.1 mL/min (A) (by C-G formula based on SCr of 2.87 mg/dL (H)). Liver Function Tests: Recent Labs  Lab 08/20/24 1526 08/21/24 0302 08/22/24 0221  AST 60* 49* 41  ALT 45* 41 39  ALKPHOS 89 66 61  BILITOT 2.0* 1.8* 1.9*  PROT 7.0 6.5 6.7  ALBUMIN 4.2 3.7 3.6   No results for input(s):  LIPASE, AMYLASE in the last 168 hours. No results for input(s): AMMONIA in the last 168 hours. Coagulation Profile: No results for input(s): INR, PROTIME in the last 168 hours. Cardiac Enzymes: No results for input(s): CKTOTAL, CKMB, CKMBINDEX, TROPONINI in the last 168 hours. BNP (last 3 results) Recent Labs    05/24/24 1702 08/20/24 1526  PROBNP 2,422.0* 14,090.0*   HbA1C: No results for input(s): HGBA1C in the last 72 hours. CBG: Recent Labs  Lab 08/20/24 2028  GLUCAP 109*   Lipid Profile: No results for input(s): CHOL, HDL, LDLCALC, TRIG, CHOLHDL, LDLDIRECT in the last 72 hours. Thyroid  Function Tests: No results for input(s): TSH, T4TOTAL, FREET4, T3FREE, THYROIDAB in the last 72 hours. Anemia Panel: Recent Labs    08/22/24 0221  FERRITIN 17*   Urine analysis:    Component Value Date/Time   COLORURINE YELLOW 05/24/2024 1930   APPEARANCEUR CLEAR 05/24/2024 1930   APPEARANCEUR Clear 09/28/2023 0912   LABSPEC 1.015 05/24/2024 1930   PHURINE 7.5 05/24/2024 1930   GLUCOSEU NEGATIVE 05/24/2024 1930   HGBUR NEGATIVE 05/24/2024 1930   BILIRUBINUR NEGATIVE 05/24/2024 1930   BILIRUBINUR Negative 09/28/2023 0912   KETONESUR NEGATIVE 05/24/2024 1930   PROTEINUR 100 (A) 05/24/2024 1930   NITRITE NEGATIVE 05/24/2024 1930   LEUKOCYTESUR NEGATIVE 05/24/2024 1930    ) Recent Results (from the past 240 hours)  Resp panel by RT-PCR (RSV, Flu A&B, Covid) Anterior Nasal Swab     Status: Abnormal   Collection Time: 08/20/24  3:26 PM   Specimen: Anterior Nasal Swab  Result Value Ref Range Status   SARS Coronavirus 2 by RT PCR POSITIVE (A) NEGATIVE Final    Comment: (NOTE) SARS-CoV-2 target nucleic acids are DETECTED.  The SARS-CoV-2 RNA is generally detectable in upper respiratory specimens during the acute phase of infection. Positive results are indicative of the presence of the identified virus, but do not rule out bacterial  infection or co-infection with other pathogens not detected by the test. Clinical correlation with patient history and other diagnostic information is necessary to determine patient infection status. The expected result is Negative.  Fact Sheet for Patients: BloggerCourse.com  Fact Sheet for Healthcare Providers: SeriousBroker.it  This test is not yet approved or cleared by the [place]  FDA and  has been authorized for detection and/or diagnosis of SARS-CoV-2 by FDA under an Emergency Use Authorization (EUA).  This EUA will remain in effect (meaning this test can be used) for the duration of  the COVID-19 declaration under Section 564(b)(1) of the A ct, 21 U.S.C. section 360bbb-3(b)(1), unless the  authorization is terminated or revoked sooner.     Influenza A by PCR NEGATIVE NEGATIVE Final   Influenza B by PCR NEGATIVE NEGATIVE Final    Comment: (NOTE) The Xpert Xpress SARS-CoV-2/FLU/RSV plus assay is intended as an aid in the diagnosis of influenza from Nasopharyngeal swab specimens and should not be used as a sole basis for treatment. Nasal washings and aspirates are unacceptable for Xpert Xpress SARS-CoV-2/FLU/RSV testing.  Fact Sheet for Patients: BloggerCourse.com  Fact Sheet for Healthcare Providers: SeriousBroker.it  This test is not yet approved or cleared by the [place]  FDA and has been authorized for detection and/or diagnosis of SARS-CoV-2 by FDA under an Emergency Use Authorization (EUA). This EUA will remain in effect (meaning this test can be used) for the duration of the COVID-19 declaration under Section 564(b)(1) of the Act, 21 U.S.C. section 360bbb-3(b)(1), unless the authorization is terminated or revoked.     Resp Syncytial Virus by PCR NEGATIVE NEGATIVE Final    Comment: (NOTE) Fact Sheet for  Patients: BloggerCourse.com  Fact Sheet for Healthcare Providers: SeriousBroker.it  This test is not yet approved or cleared by the [place]  FDA and has been authorized for detection and/or diagnosis of SARS-CoV-2 by FDA under an Emergency Use Authorization (EUA). This EUA will remain in effect (meaning this test can be used) for the duration of the COVID-19 declaration under Section 564(b)(1) of the Act, 21 U.S.C. section 360bbb-3(b)(1), unless the authorization is terminated or revoked.  Performed at [facility], [address], [place], [place] [address]      Radiology Studies: No results found.    Scheduled Meds:  dexamethasone   6 mg Oral Daily   furosemide   40 mg Intravenous BID   metoprolol  tartrate  25 mg Oral BID   tamsulosin   0.4 mg Oral Daily   Continuous Infusions:   LOS: 3 days    Time spent:    Harlene Bowl DO [facility]   08/23/2024, 1:32 PM

## 2024-08-23 NOTE — Plan of Care (Signed)
  Problem: Education: Goal: Knowledge of General Education information will improve Description: Including pain rating scale, medication(s)/side effects and non-pharmacologic comfort measures Outcome: Progressing   Problem: Health Behavior/Discharge Planning: Goal: Ability to manage health-related needs will improve Outcome: Progressing   Problem: Clinical Measurements: Goal: Ability to maintain clinical measurements within normal limits will improve Outcome: Progressing Goal: Will remain free from infection Outcome: Progressing Goal: Diagnostic test results will improve Outcome: Progressing Goal: Respiratory complications will improve Outcome: Progressing Goal: Cardiovascular complication will be avoided Outcome: Progressing   Problem: Activity: Goal: Risk for activity intolerance will decrease Outcome: Progressing   Problem: Nutrition: Goal: Adequate nutrition will be maintained Outcome: Progressing   Problem: Coping: Goal: Level of anxiety will decrease Outcome: Progressing   Problem: Elimination: Goal: Will not experience complications related to bowel motility Outcome: Progressing Goal: Will not experience complications related to urinary retention Outcome: Progressing   Problem: Pain Managment: Goal: General experience of comfort will improve and/or be controlled Outcome: Progressing   Problem: Safety: Goal: Ability to remain free from injury will improve Outcome: Progressing   Problem: Skin Integrity: Goal: Risk for impaired skin integrity will decrease Outcome: Progressing   Problem: Education: Goal: Ability to demonstrate management of disease process will improve Outcome: Progressing Goal: Ability to verbalize understanding of medication therapies will improve Outcome: Progressing Goal: Individualized Educational Video(s) Outcome: Progressing   Problem: Education: Goal: Ability to verbalize understanding of medication therapies will  improve Outcome: Progressing

## 2024-08-23 NOTE — NC FL2 (Signed)
 [place]  MEDICAID FL2 LEVEL OF CARE FORM     IDENTIFICATION  Patient Name: Scott [name] Birthdate: 09/20/1928 Sex: male Admission Date (Current Location): 08/20/2024  [facility] and [place] Number:  Producer, television/film/video and Address:  [facility] [place], [address], [place], [place] [address]      Provider Number: [id number]  Attending Physician Name and Address:  Juvenal Harlene PENNER, DO  Relative Name and Phone Number:  Taft Fritz (Daughter)  [phone]    Current Level of Care: Hospital Recommended Level of Care: Skilled Nursing Facility Prior Approval Number:    Date Approved/Denied:   PASRR Number: [id number]  Discharge Plan: SNF    Current Diagnoses: Patient Active Problem List   Diagnosis Date Noted   COVID-19 virus infection 08/21/2024   Acute respiratory failure with hypoxemia (HCC) 08/21/2024   AKI (acute kidney injury) (HCC) 08/21/2024   Acute metabolic encephalopathy 08/21/2024   Acute CHF (congestive heart failure) (HCC) 08/20/2024   Chronic diastolic CHF (congestive heart failure) (HCC) 05/25/2024   Abnormal ECG 05/25/2024   Syncope and collapse 05/25/2024   Acute on chronic diastolic CHF (congestive heart failure) (HCC) 05/24/2024   Thrombocytopenia (HCC)    SOB (shortness of breath)    Osteoarthritis    Obesity    Hypertension    H/O echocardiogram    GERD (gastroesophageal reflux disease)    Cranial neuropathy    Generalized OA    Cancer (HCC)    CAD (coronary artery disease)    BPH (benign prostatic hyperplasia)    Anxiety    Anemia    Allergic rhinitis 09/09/2015   Constipation 09/09/2015   Erectile dysfunction due to arterial insufficiency 09/09/2015   Pure hypercholesterolemia 09/09/2015   Osteoarthritis of hip 09/09/2015   Osteoarthritis of knee 09/09/2015   Malignant neoplasm of skin 09/09/2015   Screening for gout 01/06/2015   Inguinal hernia unilateral, non-recurrent, left 03/22/2013   Umbilical hernia,  asymptomatic 03/22/2013   DOE (dyspnea on exertion) 05/17/2012   Obstructive sleep apnea 01/06/2008   Insomnia 01/06/2008    Orientation RESPIRATION BLADDER Height & Weight     Self, Place  O2 (1L O2) Incontinent Weight: 193 lb 12.6 oz (87.9 kg) Height:  5' 5 (165.1 cm)  BEHAVIORAL SYMPTOMS/MOOD NEUROLOGICAL BOWEL NUTRITION STATUS      Continent Diet (See discharge summary)  AMBULATORY STATUS COMMUNICATION OF NEEDS Skin   Extensive Assist Verbally Normal                       Personal Care Assistance Level of Assistance  Bathing, Dressing, Feeding Bathing Assistance: Limited assistance Feeding assistance: Independent Dressing Assistance: Limited assistance     Functional Limitations Info  Sight, Hearing, Speech Sight Info: Impaired (eyeglasses) Hearing Info: Adequate Speech Info: Adequate    SPECIAL CARE FACTORS FREQUENCY  PT (By licensed PT), OT (By licensed OT)     PT Frequency: 5x week OT Frequency: 5x week            Contractures Contractures Info: Not present    Additional Factors Info  Code Status, Allergies, Isolation Precautions Code Status Info: DNR limited Allergies Info: Acetaminophen , Amlodipine, Bactrim  (Sulfamethoxazole -trimethoprim ), Codeine, Doxepin, Fexofenadine, Ibuprofen , Lovenox (Enoxaparin Sodium), Naproxen, Prednisone, Shellfish Allergy, Sonata  (Zaleplon ), Suvorexant      Isolation Precautions Info: Air/Con pre     Current Medications (08/23/2024):  This is the current hospital active medication list Current Facility-Administered Medications  Medication Dose Route Frequency Provider Last Rate Last Admin  dexamethasone  (DECADRON ) tablet 6 mg  6 mg Oral Daily Joseph, Preetha, MD   6 mg at 08/23/24 1016   [START ON 08/24/2024] furosemide  (LASIX ) injection 40 mg  40 mg Intravenous BID Vann, Jessica U, DO       ipratropium-albuterol  (DUONEB) 0.5-2.5 (3) MG/3ML nebulizer solution 3 mL  3 mL Nebulization Q6H PRN Alfornia Madison, MD        metoprolol  tartrate (LOPRESSOR ) tablet 25 mg  25 mg Oral BID Joseph, Preetha, MD   25 mg at 08/23/24 1016   tamsulosin  (FLOMAX ) capsule 0.4 mg  0.4 mg Oral Daily Joseph, Preetha, MD   0.4 mg at 08/23/24 1016     Discharge Medications: Please see discharge summary for a list of discharge medications.  Relevant Imaging Results:  Relevant Lab Results:   Additional Information SSN [id number]; may have bipap needs (at this time on bipap)  Harden Bramer C Demontez Novack, LCSWA

## 2024-08-23 NOTE — TOC Initial Note (Addendum)
 Transition of Care [facility]) - Initial/Assessment Note    Patient Details  Name: Scott [name] MRN: [id number] Date of Birth: 09/05/1928  [facility]) CM/SW Contact:    [name], LCSWA Phone Number: 08/23/2024, 2:30 PM  Clinical Narrative:     Patient tested positive for COVID on 9/15. Patient currently ox2 .  CSW spoke with patients daughter, [name], about PT recs for SNF. [name] stated she is agreeable to patient going to SNF at discharge. CSW explained SNF process and insurance authorization process. [name] expressed her understanding verbally. [name] provided CSW with her email to send bed offers and medicare.gov ratings to ([email or web address]). CSW to fax patient out for SNF at this time.   3:01 PM CSW to follow up with SNFs regarding COVID policies. Per [facility], patient will need to isolate for 10 days before they will accept patient referral.   CSW will continue to follow.   Expected Discharge Plan: Skilled Nursing Facility Barriers to Discharge: Continued Medical Work up, SNF Pending bed offer, Insurance Authorization   Patient Goals and CMS Choice Patient states their goals for this hospitalization and ongoing recovery are:: To go to rehab          Expected Discharge Plan and Services In-house Referral: Clinical Social Work     Living arrangements for the past 2 months: Apartment                                      Prior Living Arrangements/Services Living arrangements for the past 2 months: Apartment Lives with:: Adult Children Patient language and need for interpreter reviewed:: Yes Do you feel safe going back to the place where you live?: Yes      Need for Family Participation in Patient Care: Yes (Comment) Care giver support system in place?: Yes (comment)   Criminal Activity/Legal Involvement Pertinent to Current Situation/Hospitalization: No - Comment as needed  Activities of Daily Living      Permission Sought/Granted Permission  sought to share information with : Facility Medical sales representative, Family Supports Permission granted to share information with : No (Family contact info on chart)  Share Information with NAME: Taft [name]  Permission granted to share info w AGENCY: SNFs  Permission granted to share info w Relationship: Daughter  Permission granted to share info w Contact Information: [phone]  Emotional Assessment Appearance:: Appears stated age Attitude/Demeanor/Rapport: Unable to Assess Affect (typically observed): Unable to Assess Orientation: : Oriented to Self, Oriented to Place Alcohol / Substance Use: Not Applicable Psych Involvement: No (comment)  Admission diagnosis:  Acute CHF (congestive heart failure) (HCC) [I50.9] Acute respiratory failure with hypoxia and hypercapnia (HCC) [J96.01, J96.02] Acute on chronic congestive heart failure, unspecified heart failure type (HCC) [I50.9] COVID-19 [U07.1] Patient Active Problem List   Diagnosis Date Noted   COVID-19 virus infection 08/21/2024   Acute respiratory failure with hypoxemia (HCC) 08/21/2024   AKI (acute kidney injury) (HCC) 08/21/2024   Acute metabolic encephalopathy 08/21/2024   Acute CHF (congestive heart failure) (HCC) 08/20/2024   Chronic diastolic CHF (congestive heart failure) (HCC) 05/25/2024   Abnormal ECG 05/25/2024   Syncope and collapse 05/25/2024   Acute on chronic diastolic CHF (congestive heart failure) (HCC) 05/24/2024   Thrombocytopenia (HCC)    SOB (shortness of breath)    Osteoarthritis    Obesity    Hypertension    H/O echocardiogram    GERD (gastroesophageal reflux disease)  Cranial neuropathy    Generalized OA    Cancer (HCC)    CAD (coronary artery disease)    BPH (benign prostatic hyperplasia)    Anxiety    Anemia    Allergic rhinitis 09/09/2015   Constipation 09/09/2015   Erectile dysfunction due to arterial insufficiency 09/09/2015   Pure hypercholesterolemia 09/09/2015   Osteoarthritis of  hip 09/09/2015   Osteoarthritis of knee 09/09/2015   Malignant neoplasm of skin 09/09/2015   Screening for gout 01/06/2015   Inguinal hernia unilateral, non-recurrent, left 03/22/2013   Umbilical hernia, asymptomatic 03/22/2013   DOE (dyspnea on exertion) 05/17/2012   Obstructive sleep apnea 01/06/2008   Insomnia 01/06/2008   PCP:  Nanci Senior, MD Pharmacy:   [phone] - [place], [place] - [address] [place] [address] Phone: [phone] Fax: [phone]  [facility] [address] [place] [address] Phone: [phone] Fax: [phone]     Social Drivers of Health (SDOH) Social History: SDOH Screenings   Food Insecurity: Patient Unable To Answer (08/21/2024)  Housing: Unknown (08/21/2024)  Transportation Needs: Patient Unable To Answer (08/21/2024)  Utilities: Not At Risk (05/26/2024)  Social Connections: Unknown (08/21/2024)  Tobacco Use: Low Risk  (08/20/2024)   SDOH Interventions:     Readmission Risk Interventions    08/21/2024    9:52 AM  Readmission Risk Prevention Plan  Transportation Screening Complete  PCP or Specialist Appt within 5-7 Days Complete  Home Care Screening Complete  Medication Review (RN CM) Complete

## 2024-08-23 NOTE — Evaluation (Addendum)
 Physical Therapy Evaluation Patient Details Name: Scott [name] MRN: [id number] DOB: 1928-05-12 Today's Date: 08/23/2024  History of Present Illness  89 y.o. male adm 08/20/24 for DOE, bil pitting edema, COVID-19 (+), CHF exacerbation and encephalopathy. PMH: CAD, HFpEF, HTN, OSA, BPH  Clinical Impression  Pt pleasantly confused stating he is in [place] and repeatedly asking if he is ready to go. Pt with min assist to rise from surfaces with reliance on RW for gait able to walk in room. No family present during mobility to assess change from baseline and per OT pt lives with daughter. Pt with decreased gait, transfers and safety who will benefit from acute therapy to maximize mobility, safety and independence. Pt currently reliant on 1L O2 to maintain SPO2 >90%. Patient will benefit from continued inpatient follow up therapy, <3 hours/day with long term ALF recommended unless family able to provide 24hr care.        If plan is discharge home, recommend the following: A little help with walking and/or transfers;A little help with bathing/dressing/bathroom;Assistance with cooking/housework;Direct supervision/assist for financial management;Supervision due to cognitive status;Assist for transportation;Help with stairs or ramp for entrance   Can travel by private vehicle        Equipment Recommendations None recommended by PT  Recommendations for Other Services       Functional Status Assessment Patient has had a recent decline in their functional status and demonstrates the ability to make significant improvements in function in a reasonable and predictable amount of time.     Precautions / Restrictions Precautions Precautions: Fall;Other (comment) Recall of Precautions/Restrictions: Impaired Precaution/Restrictions Comments: watch SPO2      Mobility  Bed Mobility Overal bed mobility: Needs Assistance Bed Mobility: Supine to Sit     Supine to sit: HOB elevated, Used rails,  Supervision     General bed mobility comments: cues for sequence with use of rail, increaed time and HOB 25 degrees    Transfers Overall transfer level: Needs assistance   Transfers: Sit to/from Stand Sit to Stand: Min assist           General transfer comment: min assist to rise from bed and chair with cues for hand placement and safety    Ambulation/Gait Ambulation/Gait assistance: Contact guard assist Gait Distance (Feet): 30 Feet Assistive device: Rolling walker (2 wheels) Gait Pattern/deviations: Step-through pattern, Decreased stride length, Trunk flexed   Gait velocity interpretation: 1.31 - 2.62 ft/sec, indicative of limited community ambulator   General Gait Details: cues for posture, proximity to RW and direction. Pt with limited awareness of deficits and decreased ability to follow cues  Stairs            Wheelchair Mobility     Tilt Bed    Modified Rankin (Stroke Patients Only)       Balance Overall balance assessment: Needs assistance Sitting-balance support: No upper extremity supported, Feet supported Sitting balance-Leahy Scale: Fair     Standing balance support: Bilateral upper extremity supported, During functional activity, Reliant on assistive device for balance Standing balance-Leahy Scale: Poor Standing balance comment: Reliant on RW                             Pertinent Vitals/Pain Pain Assessment Pain Assessment: No/denies pain    Home Living Family/patient expects to be discharged to:: Private residence Living Arrangements: Children Available Help at Discharge: Family;Available 24 hours/day Type of Home: House Home Access: Stairs to enter Entrance Stairs-Rails:  None Entrance Stairs-Number of Steps: 2   Home Layout: One level Home Equipment: Other (comment);Rolling Walker (2 wheels);Cane - single point;Grab bars - tub/shower;Adaptive equipment;Shower seat Additional Comments: per OT PLOF, pt unable to provide  and no family present    Prior Function Prior Level of Function : Independent/Modified Independent             Mobility Comments: Mod I with use of RW ADLs Comments: mostly mod I with use of AE to assist in LB dressing.     Extremity/Trunk Assessment   Upper Extremity Assessment Upper Extremity Assessment: Generalized weakness    Lower Extremity Assessment Lower Extremity Assessment: Generalized weakness    Cervical / Trunk Assessment Cervical / Trunk Assessment: Kyphotic  Communication   Communication Communication: Impaired Factors Affecting Communication: Hearing impaired    Cognition Arousal: Alert Behavior During Therapy: WFL for tasks assessed/performed   PT - Cognitive impairments: No family/caregiver present to determine baseline, Orientation, Awareness, Memory, Problem solving, Safety/Judgement                         Following commands: Impaired Following commands impaired: Follows one step commands with increased time, Follows one step commands inconsistently     Cueing Cueing Techniques: Verbal cues, Gestural cues     General Comments      Exercises General Exercises - Lower Extremity Long Arc Quad: AROM, Both, 10 reps, Strengthening, Seated Hip Flexion/Marching: AROM, Both, Seated, Strengthening   Assessment/Plan    PT Assessment Patient needs continued PT services  PT Problem List Decreased strength;Decreased coordination;Decreased activity tolerance;Decreased balance;Decreased mobility;Decreased safety awareness;Decreased knowledge of use of DME;Decreased cognition       PT Treatment Interventions DME instruction;Gait training;Functional mobility training;Therapeutic activities;Therapeutic exercise;Patient/family education;Cognitive remediation;Balance training    PT Goals (Current goals can be found in the Care Plan section)  Acute Rehab PT Goals Patient Stated Goal: return home PT Goal Formulation: With patient Time For Goal  Achievement: 09/06/24 Potential to Achieve Goals: Fair    Frequency Min 2X/week     Co-evaluation               AM-PAC PT 6 Clicks Mobility  Outcome Measure Help needed turning from your back to your side while in a flat bed without using bedrails?: A Little Help needed moving from lying on your back to sitting on the side of a flat bed without using bedrails?: A Little Help needed moving to and from a bed to a chair (including a wheelchair)?: A Little Help needed standing up from a chair using your arms (e.g., wheelchair or bedside chair)?: A Little Help needed to walk in hospital room?: A Little Help needed climbing 3-5 steps with a railing? : A Lot 6 Click Score: 17    End of Session Equipment Utilized During Treatment: Gait belt Activity Tolerance: Patient tolerated treatment well Patient left: in chair;with call bell/phone within reach;with chair alarm set Nurse Communication: Mobility status PT Visit Diagnosis: Other abnormalities of gait and mobility (R26.89);Difficulty in walking, not elsewhere classified (R26.2);Muscle weakness (generalized) (M62.81)    Time: 8941-8876 PT Time Calculation (min) (ACUTE ONLY): 25 min   Charges:   PT Evaluation $PT Eval Moderate Complexity: 1 Mod PT Treatments $Gait Training: 8-22 mins PT General Charges $$ ACUTE PT VISIT: 1 Visit         Lenoard SQUIBB, PT Acute Rehabilitation Services Office: [phone]   Lenoard NOVAK Britanee Vanblarcom 08/23/2024, 12:38 PM

## 2024-08-24 DIAGNOSIS — U071 COVID-19: Secondary | ICD-10-CM | POA: Diagnosis not present

## 2024-08-24 DIAGNOSIS — I509 Heart failure, unspecified: Secondary | ICD-10-CM | POA: Diagnosis not present

## 2024-08-24 LAB — URINALYSIS, ROUTINE W REFLEX MICROSCOPIC
Bilirubin Urine: NEGATIVE
Glucose, UA: NEGATIVE mg/dL
Hgb urine dipstick: NEGATIVE
Ketones, ur: NEGATIVE mg/dL
Leukocytes,Ua: NEGATIVE
Nitrite: NEGATIVE
Protein, ur: 30 mg/dL — AB
Specific Gravity, Urine: 1.016 (ref 1.005–1.030)
pH: 5 (ref 5.0–8.0)

## 2024-08-24 LAB — BASIC METABOLIC PANEL WITH GFR
Anion gap: 14 (ref 5–15)
BUN: 84 mg/dL — ABNORMAL HIGH (ref 8–23)
CO2: 30 mmol/L (ref 22–32)
Calcium: 8.8 mg/dL — ABNORMAL LOW (ref 8.9–10.3)
Chloride: 92 mmol/L — ABNORMAL LOW (ref 98–111)
Creatinine, Ser: 3.41 mg/dL — ABNORMAL HIGH (ref 0.61–1.24)
GFR, Estimated: 16 mL/min — ABNORMAL LOW (ref >60)
Glucose, Bld: 128 mg/dL — ABNORMAL HIGH (ref 70–99)
Potassium: 4.8 mmol/L (ref 3.5–5.1)
Sodium: 136 mmol/L (ref 135–145)

## 2024-08-24 LAB — CBC
HCT: 39.1 % (ref 39.0–52.0)
Hemoglobin: 12 g/dL — ABNORMAL LOW (ref 13.0–17.0)
MCH: 29.8 pg (ref 26.0–34.0)
MCHC: 30.7 g/dL (ref 30.0–36.0)
MCV: 97 fL (ref 80.0–100.0)
Platelets: 110 K/uL — ABNORMAL LOW (ref 150–400)
RBC: 4.03 MIL/uL — ABNORMAL LOW (ref 4.22–5.81)
RDW: 16.9 % — ABNORMAL HIGH (ref 11.5–15.5)
WBC: 10.7 K/uL — ABNORMAL HIGH (ref 4.0–10.5)
nRBC: 0.2 % (ref 0.0–0.2)

## 2024-08-24 MED ORDER — FLUTICASONE FUROATE-VILANTEROL 100-25 MCG/ACT IN AEPB: 1.0000 | INHALATION_SPRAY | Freq: Every day | RESPIRATORY_TRACT | Status: DC | PRN

## 2024-08-24 MED ORDER — PANTOPRAZOLE SODIUM 20 MG PO TBEC
20.0000 mg | DELAYED_RELEASE_TABLET | Freq: Every day | ORAL | Status: DC
  Administered 2024-08-24 – 2024-08-31 (×7): 20 mg via ORAL
  Filled 2024-08-24 (×8): qty 1

## 2024-08-24 MED ORDER — HEPARIN SODIUM (PORCINE) 5000 UNIT/ML IJ SOLN
5000.0000 [IU] | Freq: Three times a day (TID) | INTRAMUSCULAR | Status: DC
  Administered 2024-08-24 – 2024-08-31 (×21): 5000 [IU] via SUBCUTANEOUS
  Filled 2024-08-24 (×21): qty 1

## 2024-08-24 NOTE — Progress Notes (Signed)
   08/24/24 2210  BiPAP/CPAP/SIPAP  Reason BIPAP/CPAP not in use Other(comment) (pt in soft mittens and also coughing with loose phlegm, sat 98% on [place], pt is not in distress.)  BiPAP/CPAP /SiPAP Vitals  Pulse Rate (!) 55  Resp 16  SpO2 97 %  MEWS Score/Color  MEWS Score 0  MEWS Score Color Green

## 2024-08-24 NOTE — TOC Progression Note (Signed)
 Transition of Care [facility]) - Progression Note    Patient Details  Name: Scott [name] MRN: [id number] Date of Birth: 1928-02-24  Transition of [facility]) CM/SW Contact  [name], [place] Phone Number: 08/24/2024, 2:41 PM  Clinical Narrative:  Per accepting and considering status SNFs, patient will need to isolate for 10 days prior to discharging to a SNF. CSW relayed this information to patients daughter, [name].   CSW will continue to follow.     Expected Discharge Plan: Skilled Nursing Facility Barriers to Discharge: Continued Medical Work up, SNF Pending bed offer, Insurance Authorization               Expected Discharge Plan and Services In-house Referral: Clinical Social Work     Living arrangements for the past 2 months: Apartment                                       Social Drivers of Health (SDOH) Interventions SDOH Screenings   Food Insecurity: Patient Unable To Answer (08/21/2024)  Housing: Unknown (08/21/2024)  Transportation Needs: Patient Unable To Answer (08/21/2024)  Utilities: Not At Risk (05/26/2024)  Social Connections: Unknown (08/21/2024)  Tobacco Use: Low Risk  (08/20/2024)    Readmission Risk Interventions    08/21/2024    9:52 AM  Readmission Risk Prevention Plan  Transportation Screening Complete  PCP or Specialist Appt within 5-7 Days Complete  Home Care Screening Complete  Medication Review (RN CM) Complete

## 2024-08-24 NOTE — Progress Notes (Signed)
 PROGRESS NOTE    Scott [name]  FMW:[id number] DOB: Nov 22, 1928 DOA: 08/20/2024 PCP: Nanci Senior, MD    89/M with history of CAD/CABG, chronic diastolic CHF, CKD, COPD, OSA noncompliant with CPAP, presented to the ED with worsening swelling, weight gain, confusion.  In the emergency room noted to be hypoxic 72%, ABG noted severe hypercarbia, placed on BiPAP and diuretics, labs with creatinine of 2.7, troponin 83, proBNP 14090, SARS COVID PCR positive, CT chest with bilateral pleural effusions. Slow if any improvement in ability to wean O2   Subjective: Pleasantly confused but cooperative  Assessment and Plan:  Acute on chronic hypoxic and hypercarbic respiratory failure Acute metabolic encephalopathy -Multifactorial, primarily CHF exacerbation, underlying OSA OHS with noncompliance to CPAP also contributing - nightly CPAP - Blood pressure is not tolerating diuresis with IV Lasix  twice daily so will decrease dose -repeat echo with preserved EF, moderately reduced RV, severely elevated PASP - Discussed with daughter yesterday, plan for conservative management-if worsens may need transition to comfort care - Increase activity, PT OT eval-home health versus SNF  Acute on chronic diastolic CHF RV failure, pulmonary hypertension -Echo and diuretics as noted above, conservative management   SARS COVID-19 positive - Dr. Fairy doubts this is contributing to his symptoms, CT findings primarily with pleural effusions - Decadron  for now, changed to p.o.  Elevated troponin Secondary to demand ischemia, no ACS  AKI on CKD 3 A - Cardiorenal, initially improving with diuresis, creatinine 2.7 on admission  - Initially dropped but now trending up so we will hold Lasix  for today and again - Hold on any IV fluids and recheck in the a.m.  Abnormal LFTs - Appears resolved  COPD - Continue nebs  Hospital induced delirium - Delirium precautions  PT eval recommending skilled nursing  facility currently Palliative care consult for GOC    DVT prophylaxis: Not currently on Lovenox--appears to have a contraindication/allergy-Will add TED hose--after discussion with pharmacy will do a trial of heparin  Code Status: DNR Family Communication: Called daughter Disposition Plan: To be determined-- SNF if improves     Objective: Vitals:   08/23/24 1600 08/23/24 1941 08/24/24 0106 08/24/24 0538  BP: 101/63 107/66 137/71 126/71  Pulse: 69 87 75 74  Resp: 18 (!) 21 (!) 23 17  Temp:  97.8 F (36.6 C) 98.2 F (36.8 C) 98.2 F (36.8 C)  TempSrc:  Oral Axillary Oral  SpO2: 92% 96% 93%   Weight:    87.5 kg  Height:        Intake/Output Summary (Last 24 hours) at 08/24/2024 1220 Last data filed at 08/24/2024 0800 Gross per 24 hour  Intake 700 ml  Output 700 ml  Net 0 ml   Filed Weights   08/22/24 0500 08/23/24 0500 08/24/24 0538  Weight: 84 kg 87.9 kg 87.5 kg    Examination:   General: Appearance:    Obese male in no acute distress     Lungs:      respirations unlabored  Heart:    Normal heart rate.   MS:   All extremities are intact.  Positive lower extremity edema  Neurologic: Awake, pleasant and cooperative-mildly confused       Data Reviewed:   CBC: Recent Labs  Lab 08/20/24 1526 08/20/24 2034 08/20/24 2232 08/21/24 0302 08/22/24 0221 08/23/24 0226 08/24/24 0223  WBC 8.4  --   --  10.2 8.6 10.1 10.7*  NEUTROABS 5.7  --   --   --   --   --   --  HGB 13.2   < > 14.3 12.9* 12.9* 10.8* 12.0*  HCT 42.2   < > 42.0 43.2 44.5 36.0* 39.1  MCV 96.3  --   --  100.5* 102.3* 99.7 97.0  PLT 127*  --   --  120* 148* 111* 110*   < > = values in this interval not displayed.   Basic Metabolic Panel: Recent Labs  Lab 08/20/24 1526 08/20/24 2034 08/20/24 2232 08/21/24 0302 08/22/24 0221 08/23/24 0226 08/24/24 0223  NA 141   < > 140 141 144 137 136  K 4.9   < > 4.6 4.5 5.1 4.5 4.8  CL 97*  --   --  99 97* 90* 92*  CO2 29  --   --  28 35* 36* 30   GLUCOSE 100*  --   --  83 108* 141* 128*  BUN 53*  --   --  53* 51* 68* 84*  CREATININE 2.78*  --   --  2.41* 1.91* 2.87* 3.41*  CALCIUM 9.6  --   --  8.6* 8.9 8.5* 8.8*   < > = values in this interval not displayed.   GFR: Estimated Creatinine Clearance: 13.5 mL/min (A) (by C-G formula based on SCr of 3.41 mg/dL (H)). Liver Function Tests: Recent Labs  Lab 08/20/24 1526 08/21/24 0302 08/22/24 0221  AST 60* 49* 41  ALT 45* 41 39  ALKPHOS 89 66 61  BILITOT 2.0* 1.8* 1.9*  PROT 7.0 6.5 6.7  ALBUMIN 4.2 3.7 3.6   No results for input(s): LIPASE, AMYLASE in the last 168 hours. No results for input(s): AMMONIA in the last 168 hours. Coagulation Profile: No results for input(s): INR, PROTIME in the last 168 hours. Cardiac Enzymes: No results for input(s): CKTOTAL, CKMB, CKMBINDEX, TROPONINI in the last 168 hours. BNP (last 3 results) Recent Labs    05/24/24 1702 08/20/24 1526  PROBNP 2,422.0* 14,090.0*   HbA1C: No results for input(s): HGBA1C in the last 72 hours. CBG: Recent Labs  Lab 08/20/24 2028  GLUCAP 109*   Lipid Profile: No results for input(s): CHOL, HDL, LDLCALC, TRIG, CHOLHDL, LDLDIRECT in the last 72 hours. Thyroid  Function Tests: No results for input(s): TSH, T4TOTAL, FREET4, T3FREE, THYROIDAB in the last 72 hours. Anemia Panel: Recent Labs    08/22/24 0221  FERRITIN 17*   Urine analysis:    Component Value Date/Time   COLORURINE YELLOW 05/24/2024 1930   APPEARANCEUR CLEAR 05/24/2024 1930   APPEARANCEUR Clear 09/28/2023 0912   LABSPEC 1.015 05/24/2024 1930   PHURINE 7.5 05/24/2024 1930   GLUCOSEU NEGATIVE 05/24/2024 1930   HGBUR NEGATIVE 05/24/2024 1930   BILIRUBINUR NEGATIVE 05/24/2024 1930   BILIRUBINUR Negative 09/28/2023 0912   KETONESUR NEGATIVE 05/24/2024 1930   PROTEINUR 100 (A) 05/24/2024 1930   NITRITE NEGATIVE 05/24/2024 1930   LEUKOCYTESUR NEGATIVE 05/24/2024 1930    ) Recent Results  (from the past 240 hours)  Resp panel by RT-PCR (RSV, Flu A&B, Covid) Anterior Nasal Swab     Status: Abnormal   Collection Time: 08/20/24  3:26 PM   Specimen: Anterior Nasal Swab  Result Value Ref Range Status   SARS Coronavirus 2 by RT PCR POSITIVE (A) NEGATIVE Final    Comment: (NOTE) SARS-CoV-2 target nucleic acids are DETECTED.  The SARS-CoV-2 RNA is generally detectable in upper respiratory specimens during the acute phase of infection. Positive results are indicative of the presence of the identified virus, but do not rule out bacterial infection or co-infection with other pathogens  not detected by the test. Clinical correlation with patient history and other diagnostic information is necessary to determine patient infection status. The expected result is Negative.  Fact Sheet for Patients: BloggerCourse.com  Fact Sheet for Healthcare Providers: SeriousBroker.it  This test is not yet approved or cleared by the [place]  FDA and  has been authorized for detection and/or diagnosis of SARS-CoV-2 by FDA under an Emergency Use Authorization (EUA).  This EUA will remain in effect (meaning this test can be used) for the duration of  the COVID-19 declaration under Section 564(b)(1) of the A ct, 21 U.S.C. section 360bbb-3(b)(1), unless the authorization is terminated or revoked sooner.     Influenza A by PCR NEGATIVE NEGATIVE Final   Influenza B by PCR NEGATIVE NEGATIVE Final    Comment: (NOTE) The Xpert Xpress SARS-CoV-2/FLU/RSV plus assay is intended as an aid in the diagnosis of influenza from Nasopharyngeal swab specimens and should not be used as a sole basis for treatment. Nasal washings and aspirates are unacceptable for Xpert Xpress SARS-CoV-2/FLU/RSV testing.  Fact Sheet for Patients: BloggerCourse.com  Fact Sheet for Healthcare Providers: SeriousBroker.it  This  test is not yet approved or cleared by the [place]  FDA and has been authorized for detection and/or diagnosis of SARS-CoV-2 by FDA under an Emergency Use Authorization (EUA). This EUA will remain in effect (meaning this test can be used) for the duration of the COVID-19 declaration under Section 564(b)(1) of the Act, 21 U.S.C. section 360bbb-3(b)(1), unless the authorization is terminated or revoked.     Resp Syncytial Virus by PCR NEGATIVE NEGATIVE Final    Comment: (NOTE) Fact Sheet for Patients: BloggerCourse.com  Fact Sheet for Healthcare Providers: SeriousBroker.it  This test is not yet approved or cleared by the [place]  FDA and has been authorized for detection and/or diagnosis of SARS-CoV-2 by FDA under an Emergency Use Authorization (EUA). This EUA will remain in effect (meaning this test can be used) for the duration of the COVID-19 declaration under Section 564(b)(1) of the Act, 21 U.S.C. section 360bbb-3(b)(1), unless the authorization is terminated or revoked.  Performed at [facility], [address] [place], [place] [address]      Radiology Studies: No results found.    Scheduled Meds:  dexamethasone   6 mg Oral Daily   furosemide   40 mg Intravenous BID   metoprolol  tartrate  25 mg Oral BID   pantoprazole   20 mg Oral Daily   polyethylene glycol  17 g Oral Daily   tamsulosin   0.4 mg Oral Daily   Continuous Infusions:   LOS: 4 days    Time spent:    Harlene Bowl DO [facility]   08/24/2024, 12:20 PM

## 2024-08-24 NOTE — Plan of Care (Signed)
   Problem: Nutrition: Goal: Adequate nutrition will be maintained Outcome: Progressing   Problem: Elimination: Goal: Will not experience complications related to urinary retention Outcome: Progressing

## 2024-08-25 DIAGNOSIS — Z515 Encounter for palliative care: Secondary | ICD-10-CM | POA: Diagnosis not present

## 2024-08-25 DIAGNOSIS — J9602 Acute respiratory failure with hypercapnia: Secondary | ICD-10-CM

## 2024-08-25 DIAGNOSIS — I509 Heart failure, unspecified: Secondary | ICD-10-CM

## 2024-08-25 DIAGNOSIS — J9601 Acute respiratory failure with hypoxia: Secondary | ICD-10-CM | POA: Diagnosis not present

## 2024-08-25 DIAGNOSIS — U071 COVID-19: Secondary | ICD-10-CM | POA: Diagnosis not present

## 2024-08-25 LAB — CBC
HCT: 39.2 % (ref 39.0–52.0)
Hemoglobin: 12.1 g/dL — ABNORMAL LOW (ref 13.0–17.0)
MCH: 30 pg (ref 26.0–34.0)
MCHC: 30.9 g/dL (ref 30.0–36.0)
MCV: 97 fL (ref 80.0–100.0)
Platelets: 96 K/uL — ABNORMAL LOW (ref 150–400)
RBC: 4.04 MIL/uL — ABNORMAL LOW (ref 4.22–5.81)
RDW: 17.2 % — ABNORMAL HIGH (ref 11.5–15.5)
WBC: 9.9 K/uL (ref 4.0–10.5)
nRBC: 0 % (ref 0.0–0.2)

## 2024-08-25 LAB — BASIC METABOLIC PANEL WITH GFR
Anion gap: 13 (ref 5–15)
BUN: 89 mg/dL — ABNORMAL HIGH (ref 8–23)
CO2: 30 mmol/L (ref 22–32)
Calcium: 8.9 mg/dL (ref 8.9–10.3)
Chloride: 92 mmol/L — ABNORMAL LOW (ref 98–111)
Creatinine, Ser: 2.61 mg/dL — ABNORMAL HIGH (ref 0.61–1.24)
GFR, Estimated: 22 mL/min — ABNORMAL LOW (ref >60)
Glucose, Bld: 99 mg/dL (ref 70–99)
Potassium: 5 mmol/L (ref 3.5–5.1)
Sodium: 135 mmol/L (ref 135–145)

## 2024-08-25 NOTE — Progress Notes (Signed)
 PROGRESS NOTE    Scott [name]  FMW:[id number] DOB: [birth year] DOA: 08/20/2024 PCP: Nanci Senior, MD    89/M with history of CAD/CABG, chronic diastolic CHF, CKD, COPD, OSA noncompliant with CPAP, presented to the ED with worsening swelling, weight gain, confusion.  In the emergency room noted to be hypoxic 72%, ABG noted severe hypercarbia, placed on BiPAP and diuretics, labs with creatinine of 2.7, troponin 83, proBNP 14090, SARS COVID PCR positive, CT chest with bilateral pleural effusions. Slow if any improvement in ability to wean O2   Subjective: Continues to be confused  Assessment and Plan:  Acute on chronic hypoxic and hypercarbic respiratory failure Acute metabolic encephalopathy -Multifactorial, primarily CHF exacerbation, underlying OSA OHS with noncompliance to CPAP also contributing - nightly CPAP - Blood pressure is not tolerating diuresis with IV Lasix  twice daily so will decrease dose -repeat echo with preserved EF, moderately reduced RV, severely elevated PASP - Dr. Fairy discussed with daughter- plan for conservative management-if worsens may need transition to comfort care - Increase activity, PT OT eval-home health versus SNF  Acute on chronic diastolic CHF RV failure, pulmonary hypertension -Echo and diuretics as noted above, conservative management   SARS COVID-19 positive - Dr. Fairy doubts this is contributing to his symptoms, CT findings primarily with pleural effusions - Decadron  for now, changed to p.o.  Elevated troponin Secondary to demand ischemia, no ACS  AKI on CKD 3 A - Cardiorenal, initially improving with diuresis, creatinine 2.7 on admission  - Initially dropped but now trending up so we will hold Lasix  for today and again - Hold on any IV fluids and trend  Abnormal LFTs - Appears resolved  COPD - Continue nebs  Hospital induced delirium - Delirium precautions  PT eval recommending skilled nursing facility  currently Palliative care consult for GOC    DVT prophylaxis: Not currently on Lovenox--appears to have a contraindication/allergy-Will add TED hose--after discussion with pharmacy will do a trial of heparin  Code Status: DNR Family Communication: Called daughter Disposition Plan: To be determined-- SNF if improves     Objective: Vitals:   08/24/24 2210 08/25/24 0042 08/25/24 0427 08/25/24 0743  BP:  (!) 141/94 119/82 121/80  Pulse: (!) 55 61 73   Resp: 16 (!) 22 (!) 22 18  Temp:  99.2 F (37.3 C)  97.9 F (36.6 C)  TempSrc:  Axillary Axillary Oral  SpO2: 97% 97% 93%   Weight:   87.3 kg   Height:        Intake/Output Summary (Last 24 hours) at 08/25/2024 1154 Last data filed at 08/25/2024 0253 Gross per 24 hour  Intake 240 ml  Output 100 ml  Net 140 ml   Filed Weights   08/23/24 0500 08/24/24 0538 08/25/24 0427  Weight: 87.9 kg 87.5 kg 87.3 kg    Examination:   General: Appearance:    Obese male in no acute distress     Lungs:      respirations unlabored  Heart:    Normal heart rate.   MS:   All extremities are intact.  Positive lower extremity edema  Neurologic: Awake, pleasant and cooperative-mildly confused       Data Reviewed:   CBC: Recent Labs  Lab 08/20/24 1526 08/20/24 2034 08/21/24 0302 08/22/24 0221 08/23/24 0226 08/24/24 0223 08/25/24 0234  WBC 8.4  --  10.2 8.6 10.1 10.7* 9.9  NEUTROABS 5.7  --   --   --   --   --   --   HGB  13.2   < > 12.9* 12.9* 10.8* 12.0* 12.1*  HCT 42.2   < > 43.2 44.5 36.0* 39.1 39.2  MCV 96.3  --  100.5* 102.3* 99.7 97.0 97.0  PLT 127*  --  120* 148* 111* 110* 96*   < > = values in this interval not displayed.   Basic Metabolic Panel: Recent Labs  Lab 08/21/24 0302 08/22/24 0221 08/23/24 0226 08/24/24 0223 08/25/24 0234  NA 141 144 137 136 135  K 4.5 5.1 4.5 4.8 5.0  CL 99 97* 90* 92* 92*  CO2 28 35* 36* 30 30  GLUCOSE 83 108* 141* 128* 99  BUN 53* 51* 68* 84* 89*  CREATININE 2.41* 1.91* 2.87*  3.41* 2.61*  CALCIUM 8.6* 8.9 8.5* 8.8* 8.9   GFR: Estimated Creatinine Clearance: 17.6 mL/min (A) (by C-G formula based on SCr of 2.61 mg/dL (H)). Liver Function Tests: Recent Labs  Lab 08/20/24 1526 08/21/24 0302 08/22/24 0221  AST 60* 49* 41  ALT 45* 41 39  ALKPHOS 89 66 61  BILITOT 2.0* 1.8* 1.9*  PROT 7.0 6.5 6.7  ALBUMIN 4.2 3.7 3.6   No results for input(s): LIPASE, AMYLASE in the last 168 hours. No results for input(s): AMMONIA in the last 168 hours. Coagulation Profile: No results for input(s): INR, PROTIME in the last 168 hours. Cardiac Enzymes: No results for input(s): CKTOTAL, CKMB, CKMBINDEX, TROPONINI in the last 168 hours. BNP (last 3 results) Recent Labs    05/24/24 1702 08/20/24 1526  PROBNP 2,422.0* 14,090.0*   HbA1C: No results for input(s): HGBA1C in the last 72 hours. CBG: Recent Labs  Lab 08/20/24 2028  GLUCAP 109*   Lipid Profile: No results for input(s): CHOL, HDL, LDLCALC, TRIG, CHOLHDL, LDLDIRECT in the last 72 hours. Thyroid  Function Tests: No results for input(s): TSH, T4TOTAL, FREET4, T3FREE, THYROIDAB in the last 72 hours. Anemia Panel: No results for input(s): VITAMINB12, FOLATE, FERRITIN, TIBC, IRON, RETICCTPCT in the last 72 hours.  Urine analysis:    Component Value Date/Time   COLORURINE YELLOW 08/24/2024 1901   APPEARANCEUR HAZY (A) 08/24/2024 1901   APPEARANCEUR Clear 09/28/2023 0912   LABSPEC 1.016 08/24/2024 1901   PHURINE 5.0 08/24/2024 1901   GLUCOSEU NEGATIVE 08/24/2024 1901   HGBUR NEGATIVE 08/24/2024 1901   BILIRUBINUR NEGATIVE 08/24/2024 1901   BILIRUBINUR Negative 09/28/2023 0912   KETONESUR NEGATIVE 08/24/2024 1901   PROTEINUR 30 (A) 08/24/2024 1901   NITRITE NEGATIVE 08/24/2024 1901   LEUKOCYTESUR NEGATIVE 08/24/2024 1901    ) Recent Results (from the past 240 hours)  Resp panel by RT-PCR (RSV, Flu A&B, Covid) Anterior Nasal Swab     Status: Abnormal    Collection Time: 08/20/24  3:26 PM   Specimen: Anterior Nasal Swab  Result Value Ref Range Status   SARS Coronavirus 2 by RT PCR POSITIVE (A) NEGATIVE Final    Comment: (NOTE) SARS-CoV-2 target nucleic acids are DETECTED.  The SARS-CoV-2 RNA is generally detectable in upper respiratory specimens during the acute phase of infection. Positive results are indicative of the presence of the identified virus, but do not rule out bacterial infection or co-infection with other pathogens not detected by the test. Clinical correlation with patient history and other diagnostic information is necessary to determine patient infection status. The expected result is Negative.  Fact Sheet for Patients: BloggerCourse.com  Fact Sheet for Healthcare Providers: SeriousBroker.it  This test is not yet approved or cleared by the [place]  FDA and  has been authorized for detection and/or  diagnosis of SARS-CoV-2 by FDA under an Emergency Use Authorization (EUA).  This EUA will remain in effect (meaning this test can be used) for the duration of  the COVID-19 declaration under Section 564(b)(1) of the A ct, 21 U.S.C. section 360bbb-3(b)(1), unless the authorization is terminated or revoked sooner.     Influenza A by PCR NEGATIVE NEGATIVE Final   Influenza B by PCR NEGATIVE NEGATIVE Final    Comment: (NOTE) The Xpert Xpress SARS-CoV-2/FLU/RSV plus assay is intended as an aid in the diagnosis of influenza from Nasopharyngeal swab specimens and should not be used as a sole basis for treatment. Nasal washings and aspirates are unacceptable for Xpert Xpress SARS-CoV-2/FLU/RSV testing.  Fact Sheet for Patients: BloggerCourse.com  Fact Sheet for Healthcare Providers: SeriousBroker.it  This test is not yet approved or cleared by the [place]  FDA and has been authorized for detection and/or  diagnosis of SARS-CoV-2 by FDA under an Emergency Use Authorization (EUA). This EUA will remain in effect (meaning this test can be used) for the duration of the COVID-19 declaration under Section 564(b)(1) of the Act, 21 U.S.C. section 360bbb-3(b)(1), unless the authorization is terminated or revoked.     Resp Syncytial Virus by PCR NEGATIVE NEGATIVE Final    Comment: (NOTE) Fact Sheet for Patients: BloggerCourse.com  Fact Sheet for Healthcare Providers: SeriousBroker.it  This test is not yet approved or cleared by the [place]  FDA and has been authorized for detection and/or diagnosis of SARS-CoV-2 by FDA under an Emergency Use Authorization (EUA). This EUA will remain in effect (meaning this test can be used) for the duration of the COVID-19 declaration under Section 564(b)(1) of the Act, 21 U.S.C. section 360bbb-3(b)(1), unless the authorization is terminated or revoked.  Performed at [facility], [address], [place], [place] [address]      Radiology Studies: No results found.    Scheduled Meds:  dexamethasone   6 mg Oral Daily   heparin  injection (subcutaneous)  5,000 Units Subcutaneous Q8H   metoprolol  tartrate  25 mg Oral BID   pantoprazole   20 mg Oral Daily   polyethylene glycol  17 g Oral Daily   tamsulosin   0.4 mg Oral Daily   Continuous Infusions:   LOS: 5 days    Time spent:    Harlene Bowl DO [facility]   08/25/2024, 11:54 AM

## 2024-08-25 NOTE — Consult Note (Signed)
 Palliative Medicine  Name: Scott [name] Date: 08/25/2024 MRN: [id number]  DOB: 1928/10/19  Patient Care Team: Nanci Senior, MD as PCP - General (Family Medicine) [place], Peter M, MD as PCP - Cardiology (Cardiology) Nicholaus Tanda LITTIE DOUGLAS, MD (Urology)    REASON FOR CONSULTATION: Scott [name] is a 89 y.o. male with multiple medical problems including CAD/CABG, chronic diastolic dysfunction with history of CHF, CKD, COPD, OSA noncompliant with CPAP, who was admitted to the hospital 08/21/2024 with acute hypoxic and hypercapnic respiratory failure.  Patient with COVID-positive.  Palliative care was consulted to address goals.  SOCIAL HISTORY:     reports that he has never smoked. He has never used smokeless tobacco. He reports that he does not drink alcohol and does not use drugs.  Patient is a widower.  His wife died in 06-26-2025from dementia.  He lived at home alone until his daughter moved in recently to help with his care.  He also has a son who is involved.  Patient retired from [employer].  He also worked in Field seismologist.   ADVANCE DIRECTIVES:  MOST form on file  CODE STATUS: DNR  PAST MEDICAL HISTORY: Past Medical History:  Diagnosis Date   Acute bronchitis 07/11/2012   Acute pancreatitis 04/17/2019   Anemia    Acute blood loss anemia secondary to surgery, followed fr/ Hip replacement    Anxiety    pt. reports that he has occas. nervous episodes    BPH (benign prostatic hyperplasia)    CAD (coronary artery disease)    s/p CABG in 2007  CARDILOLOGIST IS DR. P. SWAZILAND   Cancer (HCC)    melanoma - removed L ear - 2010   CHF (congestive heart failure) (HCC)    Cranial neuropathy    resolved    Generalized OA    GERD (gastroesophageal reflux disease)    H/O echocardiogram    last echo, stress test 05/2012- pt. followed by [facility]    Hypercholesterolemia    Hypertension    Obesity    Osteoarthritis    End-stage osteoarthritis, right hip    Sleep apnea    with use of CPAP   SOB (shortness of breath)    WITH EXERTION   Thrombocytopenia (HCC)    possibly due to Lovenox    PAST SURGICAL HISTORY:  Past Surgical History:  Procedure Laterality Date   ACHILLES TENDON SURGERY  10/06/2012   Procedure: ACHILLES TENDON REPAIR;  Surgeon: LELON JONETTA Shari Mickey., MD;  Location: [facility] OR;  Service: Orthopedics;  Laterality: Right;  REPAIR RIGHT RUPTURE ACHILLES TENDON PRIMARY OPEN/PERCUTANEOUS, EXCISION PARTIAL BONE TALUS/CANCANEUS, REPAIR PARTIAL EXCISION CALCANEUS   APPENDECTOMY     ARTERY BIOPSY     temporal - L side- wnl, double vision treated /w prednisone - 1991   CALCANEAL OSTEOTOMY  10/06/2012   Procedure: CALCANEAL OSTEOTOMY;  Surgeon: LELON JONETTA Shari Mickey., MD;  Location: [facility] OR;  Service: Orthopedics;  Laterality: Right;   CARDIAC CATHETERIZATION  12/15/2005   NORMAL. EF 60%, angioplasty 1995   carpel tunnel surgery-bilateral     both hands    CORONARY ARTERY BYPASS GRAFT  2007   LIMA GRAFT TO THE LAD, SAPHENOUS VEIN GRAFT TO THE FIRST OBTUSE MARIGNAL, SAPHENOUS VEIN GRAFT TO THE SECOND OBTUSE MARGINAL AND A SAPHENOUS VEIN GRAFT TO THE ACUTE MARGINAL AND POSTERIOR LATERAL BRANCH   HERNIA REPAIR     I & D EXTREMITY  12/11/2012   Procedure: IRRIGATION AND DEBRIDEMENT EXTREMITY;  Surgeon: Jerona LULLA Sage, MD;  Location: [facility] OR;  Service: Orthopedics;  Laterality: Right;  Irrigation and Debridement achilles, wound closure, apply wound vac, place antibiotic beads   INGUINAL HERNIA REPAIR Left 05/10/2013   Procedure: HERNIA REPAIR INGUINAL ADULT;  Surgeon: Krystal CHRISTELLA Spinner, MD;  Location: [facility] ORS;  Service: General;  Laterality: Left;   INSERTION OF MESH Left 05/10/2013   Procedure: INSERTION OF MESH;  Surgeon: Krystal CHRISTELLA Spinner, MD;  Location: [facility] OR[facility];  Service: General;  Laterality: Left;   JOINT REPLACEMENT     knee  11/12   left arthroscopic   TOTAL HIP ARTHROPLASTY     right    HEMATOLOGY/ONCOLOGY HISTORY:  Oncology History   No history exists.     ALLERGIES:  is allergic to acetaminophen , amlodipine, bactrim  [sulfamethoxazole -trimethoprim ], codeine, doxepin, fexofenadine, ibuprofen , lovenox [enoxaparin sodium], naproxen, prednisone, shellfish allergy, sonata  [zaleplon ], and suvorexant .  MEDICATIONS:  Current Facility-Administered Medications  Medication Dose Route Frequency Provider Last Rate Last Admin   dexamethasone  (DECADRON ) tablet 6 mg  6 mg Oral Daily Joseph, Preetha, MD   6 mg at 08/25/24 9097   fluticasone  furoate-vilanterol (BREO ELLIPTA ) 100-25 MCG/ACT 1 puff  1 puff Inhalation Daily PRN Vann, Jessica U, DO       heparin  injection 5,000 Units  5,000 Units Subcutaneous Q8H Vann, Jessica U, DO   5,000 Units at 08/25/24 0620   ipratropium-albuterol  (DUONEB) 0.5-2.5 (3) MG/3ML nebulizer solution 3 mL  3 mL Nebulization Q6H PRN Alfornia Madison, MD       metoprolol  tartrate (LOPRESSOR ) tablet 25 mg  25 mg Oral BID Joseph, Preetha, MD   25 mg at 08/25/24 0902   pantoprazole  (PROTONIX ) EC tablet 20 mg  20 mg Oral Daily Vann, Jessica U, DO   20 mg at 08/25/24 9097   polyethylene glycol (MIRALAX  / GLYCOLAX ) packet 17 g  17 g Oral Daily Vann, Jessica U, DO   17 g at 08/25/24 9097   tamsulosin  (FLOMAX ) capsule 0.4 mg  0.4 mg Oral Daily Fairy Frames, MD   0.4 mg at 08/25/24 0902    VITAL SIGNS: BP (!) 116/59 (BP Location: Right Arm)   Pulse (!) 56   Temp 98 F (36.7 C) (Oral)   Resp 16   Ht 5' 5 (1.651 m)   Wt 192 lb 7.4 oz (87.3 kg)   SpO2 99%   BMI 32.03 kg/m  Filed Weights   08/23/24 0500 08/24/24 0538 08/25/24 0427  Weight: 193 lb 12.6 oz (87.9 kg) 192 lb 14.4 oz (87.5 kg) 192 lb 7.4 oz (87.3 kg)    Estimated body mass index is 32.03 kg/m as calculated from the following:   Height as of this encounter: 5' 5 (1.651 m).   Weight as of this encounter: 192 lb 7.4 oz (87.3 kg).  LABS: CBC:    Component Value Date/Time   WBC 9.9 08/25/2024 0234   HGB 12.1 (L) 08/25/2024 0234   HGB 13.6 09/05/2019 1054   HCT  39.2 08/25/2024 0234   HCT 41.0 09/05/2019 1054   PLT 96 (L) 08/25/2024 0234   PLT 122 (L) 09/05/2019 1054   MCV 97.0 08/25/2024 0234   MCV 97 09/05/2019 1054   NEUTROABS 5.7 08/20/2024 1526   NEUTROABS 4.1 09/05/2019 1054   LYMPHSABS 1.4 08/20/2024 1526   LYMPHSABS 1.6 09/05/2019 1054   MONOABS 1.2 (H) 08/20/2024 1526   EOSABS 0.1 08/20/2024 1526   EOSABS 0.4 09/05/2019 1054   BASOSABS 0.0 08/20/2024 1526   BASOSABS 0.0  09/05/2019 1054   Comprehensive Metabolic Panel:    Component Value Date/Time   NA 135 08/25/2024 0234   NA 142 07/17/2024 1552   K 5.0 08/25/2024 0234   CL 92 (L) 08/25/2024 0234   CO2 30 08/25/2024 0234   BUN 89 (H) 08/25/2024 0234   BUN 15 07/17/2024 1552   CREATININE 2.61 (H) 08/25/2024 0234   GLUCOSE 99 08/25/2024 0234   CALCIUM 8.9 08/25/2024 0234   AST 41 08/22/2024 0221   ALT 39 08/22/2024 0221   ALKPHOS 61 08/22/2024 0221   BILITOT 1.9 (H) 08/22/2024 0221   BILITOT 1.2 09/05/2019 1054   PROT 6.7 08/22/2024 0221   PROT 6.3 09/05/2019 1054   ALBUMIN 3.6 08/22/2024 0221   ALBUMIN 4.1 09/05/2019 1054    RADIOGRAPHIC STUDIES: ECHOCARDIOGRAM LIMITED Result Date: 08/21/2024    ECHOCARDIOGRAM LIMITED REPORT   Patient Name:   ELONZO SOPP Date of Exam: 08/21/2024 Medical Rec #:  [id number]       Height:       65.0 in Accession #:    [id number]      Weight:       192.7 lb Date of Birth:  12-Jul-1928       BSA:          1.947 m Patient Age:    89 years        BP:           107/45 mmHg Patient Gender: M               HR:           80 bpm. Exam Location:  Inpatient Procedure: Limited Echo, Color Doppler and Cardiac Doppler (Both Spectral and            Color Flow Doppler were utilized during procedure). Indications:    I50.31 Acute diastolic (congestive) heart failure  History:        Patient has prior history of Echocardiogram examinations, most                 recent 05/25/2024. CHF; Risk Factors:Hypertension, Dyslipidemia                 and Sleep Apnea.   Sonographer:    Damien Senior RDCS Referring Phys: [id number] SUBRINA SUNDIL  Sonographer Comments: COVID+ at time of study IMPRESSIONS  1. Left ventricular ejection fraction, by estimation, is 60 to 65%. The left ventricle has normal function.  2. Right ventricular systolic function is moderately reduced. The right ventricular size is moderately enlarged. There is severely elevated pulmonary artery systolic pressure. The estimated right ventricular systolic pressure is 69.2 mmHg.  3. The mitral valve is normal in structure. Mild mitral valve regurgitation.  4. Tricuspid valve regurgitation is moderate to severe.  5. The aortic valve is tricuspid. Aortic valve regurgitation is not visualized. Aortic valve sclerosis is present, with no evidence of aortic valve stenosis.  6. The inferior vena cava is dilated in size with <50% respiratory variability, suggesting right atrial pressure of 15 mmHg. FINDINGS  Left Ventricle: Left ventricular ejection fraction, by estimation, is 60 to 65%. The left ventricle has normal function. Right Ventricle: The right ventricular size is moderately enlarged. Right ventricular systolic function is moderately reduced. There is severely elevated pulmonary artery systolic pressure. The tricuspid regurgitant velocity is 3.68 m/s, and with an assumed right atrial pressure of 15 mmHg, the estimated right ventricular systolic pressure is 69.2 mmHg. Mitral Valve: The mitral valve is normal in  structure. Mild mitral valve regurgitation. Tricuspid Valve: The tricuspid valve is normal in structure. Tricuspid valve regurgitation is moderate to severe. Aortic Valve: The aortic valve is tricuspid. Aortic valve regurgitation is not visualized. Aortic valve sclerosis is present, with no evidence of aortic valve stenosis. Venous: The inferior vena cava is dilated in size with less than 50% respiratory variability, suggesting right atrial pressure of 15 mmHg. Additional Comments: Spectral Doppler performed.  Color Doppler performed.  LEFT VENTRICLE PLAX 2D LVOT diam:     1.90 cm LV SV:         53 LV SV Index:   27 LVOT Area:     2.84 cm  RIGHT VENTRICLE RV S prime:     7.51 cm/s TAPSE (M-mode): 1.1 cm AORTIC VALVE LVOT Vmax:   86.20 cm/s LVOT Vmean:  63.100 cm/s LVOT VTI:    0.188 m TRICUSPID VALVE TR Peak grad:   54.2 mmHg TR Vmax:        368.00 cm/s  SHUNTS Systemic VTI:  0.19 m Systemic Diam: 1.90 cm Aditya Sabharwal Electronically signed by Ria Commander Signature Date/Time: 08/21/2024/4:23:22 PM    Final    CT CHEST WO CONTRAST Result Date: 08/21/2024 CLINICAL DATA:  Respiratory illness. EXAM: CT CHEST WITHOUT CONTRAST TECHNIQUE: Multidetector CT imaging of the chest was performed following the standard protocol without IV contrast. RADIATION DOSE REDUCTION: This exam was performed according to the departmental dose-optimization program which includes automated exposure control, adjustment of the mA and/or kV according to patient size and/or use of iterative reconstruction technique. COMPARISON:  05/24/2024 FINDINGS: Patient was scanned in a right-side-down decubitus position. Cardiovascular: Mild cardiomegaly. No substantial pericardial effusion. Coronary artery calcification is evident. Moderate atherosclerotic calcification is noted in the wall of the thoracic aorta. Status post CABG. Ascending thoracic aorta measures 4.3 cm diameter. Enlargement of the pulmonary outflow tract/main pulmonary arteries suggests pulmonary arterial hypertension. Mediastinum/Nodes: No mediastinal lymphadenopathy. No evidence for gross hilar lymphadenopathy although assessment is limited by the lack of intravenous contrast on the current study. The esophagus has normal imaging features. There is no axillary lymphadenopathy. Lungs/Pleura: Fine detail obscured by substantial breathing motion during image acquisition. Moderate right and small left dependent pleural effusions evident. Dependent atelectasis noted in the lateral right  lung. No dense consolidative airspace disease. No suspicious pulmonary nodule or mass. Upper Abdomen: Visualized portion of the upper abdomen shows no acute findings. Musculoskeletal: Body wall edema evident. Degenerative changes are noted in the right shoulder. IMPRESSION: 1. Moderate right and small left dependent pleural effusions with dependent atelectasis in the lateral right lung. 2. 4.3 cm diameter ascending thoracic aorta. Recommend annual imaging followup by CTA or MRA. This recommendation follows 2010 ACCF/AHA/AATS/ACR/ASA/SCA/SCAI/SIR/STS/SVM Guidelines for the Diagnosis and Management of Patients with Thoracic Aortic Disease. Circulation. 2010; 121: [id number]-z630. Aortic aneurysm NOS (ICD10-I71.9) 3. Enlargement of the pulmonary outflow tract/main pulmonary arteries suggests pulmonary arterial hypertension. 4. Mild body wall edema. 5.  Aortic Atherosclerosis (ICD10-I70.0). Electronically Signed   By: Camellia Candle M.D.   On: 08/21/2024 05:54   CT Head Wo Contrast Result Date: 08/20/2024 CLINICAL DATA:  Altered mental status EXAM: CT HEAD WITHOUT CONTRAST TECHNIQUE: Contiguous axial images were obtained from the base of the skull through the vertex without intravenous contrast. RADIATION DOSE REDUCTION: This exam was performed according to the departmental dose-optimization program which includes automated exposure control, adjustment of the mA and/or kV according to patient size and/or use of iterative reconstruction technique. COMPARISON:  05/24/2024 FINDINGS: Brain: No evidence of  acute infarction, hemorrhage, hydrocephalus, extra-axial collection or mass lesion/mass effect. Mild atrophic changes are noted. Mild chronic white matter ischemic change is seen. Vascular: No hyperdense vessel or unexpected calcification. Skull: Normal. Negative for fracture or focal lesion. Sinuses/Orbits: No acute finding. Other: None. IMPRESSION: Chronic atrophic and ischemic changes without acute abnormality.  Electronically Signed   By: Oneil Devonshire M.D.   On: 08/20/2024 21:07   DG Chest Portable 1 View Result Date: 08/20/2024 CLINICAL DATA:  Hypoxia EXAM: PORTABLE CHEST 1 VIEW COMPARISON:  None Available. FINDINGS: Normal mediastinum and cardiac silhouette. Midline sternotomy. Normal pulmonary vasculature. No evidence of effusion, infiltrate, or pneumothorax. No acute bony abnormality. IMPRESSION: No acute cardiopulmonary process. Electronically Signed   By: Jackquline Boxer M.D.   On: 08/20/2024 16:11   LONG TERM MONITOR (3-14 DAYS) Result Date: 08/15/2024   Normal sinus rhythm with first degree AV block   Second degree AV block Mobitz type 1 occuring predominantly in the early am hours   5 beat run of idioventricular rhythm   Otherwise rare PACs and PVCs   Symptoms do not correlate with arrhythmia Patch Wear Time:  13 days and 23 hours (2025-08-18T15:50:02-399 to 2025-09-01T15:49:55-0400) Patient had a min HR of 29 bpm, max HR of 109 bpm, and avg HR of 69 bpm. Predominant underlying rhythm was Sinus Rhythm. First Degree AV Block was present. Second Degree AV Block-Mobitz I (Wenckebach) was present. Isolated SVEs were rare (<1.0%), SVE Couplets were rare (<1.0%), and SVE Triplets were rare (<1.0%). Isolated VEs were rare (<1.0%, 3086), VE Couplets were rare (<1.0%, 52), and VE Triplets were rare (<1.0%, 1). Ventricular Bigeminy and Trigeminy were present.    PERFORMANCE STATUS (ECOG) : 4 - Bedbound  Review of Systems Unable to complete  Physical Exam General: Ill-appearing Cardiovascular: Bradycardic Pulmonary: Congested Extremities: no edema, no joint deformities Skin: no rashes Neurological: Lethargic  IMPRESSION: Patient is a 89 year old man who was admitted to hospital with hypoxic and hypercarbic respiratory failure secondary to CHF exacerbation/COVID.  Echo showed severely elevated pulmonary artery systolic pressure 69.2.  AKI/cardiorenal. Blood pressure has not tolerated aggressive  diuresis.  Patient mains on 4 to 6 L O2.  He is lethargic and confused.  Hypotensive and bradycardic.  I met with patient's daughter, Nichole.  She says that prior to this hospitalization, patient had been declining some at home.  She moved in with him several months ago after his wife died.  Patient was mostly independent with his own care and still drove until a month ago.  However, he has become increasingly sedentary.  Daughter verbalized agreement with current scope of treatment.  She says that she recognizes the patient is not doing well and could be nearing end-of-life.  She would like to give patient a chance to see if he improves.  However, in the event of decline, we discussed possible transition to comfort care.  She says that she is familiar with [employer] as she has helped with fundraising.  She would be open to the idea of hospice IPU if needed.  Patient is a DNR/DNI.  PLAN: - Continue current scope of treatment - Family is hopeful for improvement but in the event of decline, they would consider de-escalation of care/hospice - Will have PMT follow-up   Time Total: 50 minutes  Visit consisted of counseling and education dealing with the complex and emotionally intense issues of symptom management and palliative care in the setting of serious and potentially life-threatening illness.Greater than 50%  of this time  was spent counseling and coordinating care related to the above assessment and plan.  Signed by: Fonda Mower, PhD, NP-C

## 2024-08-25 NOTE — Plan of Care (Signed)
   Problem: Education: Goal: Knowledge of General Education information will improve Description: Including pain rating scale, medication(s)/side effects and non-pharmacologic comfort measures Outcome: Not Progressing

## 2024-08-26 DIAGNOSIS — Z711 Person with feared health complaint in whom no diagnosis is made: Secondary | ICD-10-CM

## 2024-08-26 DIAGNOSIS — J9602 Acute respiratory failure with hypercapnia: Secondary | ICD-10-CM | POA: Diagnosis not present

## 2024-08-26 DIAGNOSIS — R638 Other symptoms and signs concerning food and fluid intake: Secondary | ICD-10-CM

## 2024-08-26 DIAGNOSIS — U071 COVID-19: Secondary | ICD-10-CM | POA: Diagnosis not present

## 2024-08-26 DIAGNOSIS — I509 Heart failure, unspecified: Secondary | ICD-10-CM | POA: Diagnosis not present

## 2024-08-26 DIAGNOSIS — J9601 Acute respiratory failure with hypoxia: Secondary | ICD-10-CM | POA: Diagnosis not present

## 2024-08-26 DIAGNOSIS — Z515 Encounter for palliative care: Secondary | ICD-10-CM | POA: Diagnosis not present

## 2024-08-26 LAB — BASIC METABOLIC PANEL WITH GFR
Anion gap: 7 (ref 5–15)
BUN: 94 mg/dL — ABNORMAL HIGH (ref 8–23)
CO2: 33 mmol/L — ABNORMAL HIGH (ref 22–32)
Calcium: 8.5 mg/dL — ABNORMAL LOW (ref 8.9–10.3)
Chloride: 96 mmol/L — ABNORMAL LOW (ref 98–111)
Creatinine, Ser: 2.15 mg/dL — ABNORMAL HIGH (ref 0.61–1.24)
GFR, Estimated: 28 mL/min — ABNORMAL LOW (ref >60)
Glucose, Bld: 125 mg/dL — ABNORMAL HIGH (ref 70–99)
Potassium: 5.1 mmol/L (ref 3.5–5.1)
Sodium: 136 mmol/L (ref 135–145)

## 2024-08-26 LAB — CBC
HCT: 37.4 % — ABNORMAL LOW (ref 39.0–52.0)
Hemoglobin: 11.6 g/dL — ABNORMAL LOW (ref 13.0–17.0)
MCH: 30.1 pg (ref 26.0–34.0)
MCHC: 31 g/dL (ref 30.0–36.0)
MCV: 97.1 fL (ref 80.0–100.0)
Platelets: 82 K/uL — ABNORMAL LOW (ref 150–400)
RBC: 3.85 MIL/uL — ABNORMAL LOW (ref 4.22–5.81)
RDW: 17 % — ABNORMAL HIGH (ref 11.5–15.5)
WBC: 7 K/uL (ref 4.0–10.5)
nRBC: 0 % (ref 0.0–0.2)

## 2024-08-26 NOTE — Progress Notes (Signed)
 PROGRESS NOTE    Scott [name]  FMW:[id number] DOB: 1928-08-12 DOA: 08/20/2024 PCP: Nanci Senior, MD    89/M with history of CAD/CABG, chronic diastolic CHF, CKD, COPD, OSA noncompliant with CPAP, presented to the ED with worsening swelling, weight gain, confusion.  In the emergency room noted to be hypoxic 72%, ABG noted severe hypercarbia, placed on BiPAP and diuretics, labs with creatinine of 2.7, troponin 83, proBNP 14090, SARS COVID PCR positive, CT chest with bilateral pleural effusions. Slow if any improvement in ability to wean O2   Subjective: Less confused today, but not eating much  Assessment and Plan:  Acute on chronic hypoxic and hypercarbic respiratory failure Acute metabolic encephalopathy -Multifactorial, primarily CHF exacerbation, underlying OSA OHS with noncompliance to CPAP also contributing - nightly CPAP - Blood pressure is not tolerating diuresis with IV Lasix  twice daily so will decrease dose -repeat echo with preserved EF, moderately reduced RV, severely elevated PASP - Dr. Fairy discussed with daughter- plan for conservative management-if worsens may need transition to comfort care - Increase activity, PT OT eval- SNF versus hospice  Acute on chronic diastolic CHF RV failure, pulmonary hypertension -Echo and diuretics as noted above, conservative management   SARS COVID-19 positive - Dr. Fairy doubts this is contributing to his symptoms, CT findings primarily with pleural effusions - Decadron  for now, changed to p.o.  Elevated troponin Secondary to demand ischemia, no ACS  AKI on CKD 3 A - Cardiorenal, initially improving with diuresis, creatinine 2.7 on admission  - Initially dropped but now trending up so we will hold Lasix  for today and again - Hold on any IV fluids and trend  Abnormal LFTs - Appears resolved  COPD - Continue nebs  Hospital induced delirium - Delirium precautions  PT eval recommending skilled nursing facility  currently Palliative care consult for GOC    DVT prophylaxis: Not currently on Lovenox--appears to have a contraindication/allergy-Will add TED hose--after discussion with pharmacy will do a trial of heparin  Code Status: DNR Family Communication: Called daughter 9/19 Disposition Plan: To be determined-- SNF if improves     Objective: Vitals:   08/26/24 0401 08/26/24 0715 08/26/24 0735 08/26/24 0800  BP: 134/60  128/67 (!) 169/78  Pulse: 62 (!) 57 (!) 59 65  Resp: 17  18   Temp: 97.7 F (36.5 C)  (!) 97.5 F (36.4 C)   TempSrc: Oral  Oral Oral  SpO2: 99% 95% (!) 89% 100%  Weight: 87.6 kg     Height:        Intake/Output Summary (Last 24 hours) at 08/26/2024 1221 Last data filed at 08/26/2024 0402 Gross per 24 hour  Intake 360 ml  Output 850 ml  Net -490 ml   Filed Weights   08/24/24 0538 08/25/24 0427 08/26/24 0401  Weight: 87.5 kg 87.3 kg 87.6 kg    Examination:   General: Appearance:    Obese male in no acute distress     Lungs:     respirations unlabored, on [place]  Heart:    Normal heart rate.   MS:   All extremities are intact.   Neurologic:   Awake, alert- knows name/place and able to pick out year when given choices       Data Reviewed:   CBC: Recent Labs  Lab 08/20/24 1526 08/20/24 2034 08/22/24 0221 08/23/24 0226 08/24/24 0223 08/25/24 0234 08/26/24 0206  WBC 8.4   < > 8.6 10.1 10.7* 9.9 7.0  NEUTROABS 5.7  --   --   --   --   --   --  HGB 13.2   < > 12.9* 10.8* 12.0* 12.1* 11.6*  HCT 42.2   < > 44.5 36.0* 39.1 39.2 37.4*  MCV 96.3   < > 102.3* 99.7 97.0 97.0 97.1  PLT 127*   < > 148* 111* 110* 96* 82*   < > = values in this interval not displayed.   Basic Metabolic Panel: Recent Labs  Lab 08/22/24 0221 08/23/24 0226 08/24/24 0223 08/25/24 0234 08/26/24 0206  NA 144 137 136 135 136  K 5.1 4.5 4.8 5.0 5.1  CL 97* 90* 92* 92* 96*  CO2 35* 36* 30 30 33*  GLUCOSE 108* 141* 128* 99 125*  BUN 51* 68* 84* 89* 94*  CREATININE 1.91*  2.87* 3.41* 2.61* 2.15*  CALCIUM 8.9 8.5* 8.8* 8.9 8.5*   GFR: Estimated Creatinine Clearance: 21.4 mL/min (A) (by C-G formula based on SCr of 2.15 mg/dL (H)). Liver Function Tests: Recent Labs  Lab 08/20/24 1526 08/21/24 0302 08/22/24 0221  AST 60* 49* 41  ALT 45* 41 39  ALKPHOS 89 66 61  BILITOT 2.0* 1.8* 1.9*  PROT 7.0 6.5 6.7  ALBUMIN 4.2 3.7 3.6   No results for input(s): LIPASE, AMYLASE in the last 168 hours. No results for input(s): AMMONIA in the last 168 hours. Coagulation Profile: No results for input(s): INR, PROTIME in the last 168 hours. Cardiac Enzymes: No results for input(s): CKTOTAL, CKMB, CKMBINDEX, TROPONINI in the last 168 hours. BNP (last 3 results) Recent Labs    05/24/24 1702 08/20/24 1526  PROBNP 2,422.0* 14,090.0*   HbA1C: No results for input(s): HGBA1C in the last 72 hours. CBG: Recent Labs  Lab 08/20/24 2028  GLUCAP 109*   Lipid Profile: No results for input(s): CHOL, HDL, LDLCALC, TRIG, CHOLHDL, LDLDIRECT in the last 72 hours. Thyroid  Function Tests: No results for input(s): TSH, T4TOTAL, FREET4, T3FREE, THYROIDAB in the last 72 hours. Anemia Panel: No results for input(s): VITAMINB12, FOLATE, FERRITIN, TIBC, IRON, RETICCTPCT in the last 72 hours.  Urine analysis:    Component Value Date/Time   COLORURINE YELLOW 08/24/2024 1901   APPEARANCEUR HAZY (A) 08/24/2024 1901   APPEARANCEUR Clear 09/28/2023 0912   LABSPEC 1.016 08/24/2024 1901   PHURINE 5.0 08/24/2024 1901   GLUCOSEU NEGATIVE 08/24/2024 1901   HGBUR NEGATIVE 08/24/2024 1901   BILIRUBINUR NEGATIVE 08/24/2024 1901   BILIRUBINUR Negative 09/28/2023 0912   KETONESUR NEGATIVE 08/24/2024 1901   PROTEINUR 30 (A) 08/24/2024 1901   NITRITE NEGATIVE 08/24/2024 1901   LEUKOCYTESUR NEGATIVE 08/24/2024 1901    ) Recent Results (from the past 240 hours)  Resp panel by RT-PCR (RSV, Flu A&B, Covid) Anterior Nasal Swab      Status: Abnormal   Collection Time: 08/20/24  3:26 PM   Specimen: Anterior Nasal Swab  Result Value Ref Range Status   SARS Coronavirus 2 by RT PCR POSITIVE (A) NEGATIVE Final    Comment: (NOTE) SARS-CoV-2 target nucleic acids are DETECTED.  The SARS-CoV-2 RNA is generally detectable in upper respiratory specimens during the acute phase of infection. Positive results are indicative of the presence of the identified virus, but do not rule out bacterial infection or co-infection with other pathogens not detected by the test. Clinical correlation with patient history and other diagnostic information is necessary to determine patient infection status. The expected result is Negative.  Fact Sheet for Patients: BloggerCourse.com  Fact Sheet for Healthcare Providers: SeriousBroker.it  This test is not yet approved or cleared by the [place]  FDA and  has been authorized  for detection and/or diagnosis of SARS-CoV-2 by FDA under an Emergency Use Authorization (EUA).  This EUA will remain in effect (meaning this test can be used) for the duration of  the COVID-19 declaration under Section 564(b)(1) of the A ct, 21 U.S.C. section 360bbb-3(b)(1), unless the authorization is terminated or revoked sooner.     Influenza A by PCR NEGATIVE NEGATIVE Final   Influenza B by PCR NEGATIVE NEGATIVE Final    Comment: (NOTE) The Xpert Xpress SARS-CoV-2/FLU/RSV plus assay is intended as an aid in the diagnosis of influenza from Nasopharyngeal swab specimens and should not be used as a sole basis for treatment. Nasal washings and aspirates are unacceptable for Xpert Xpress SARS-CoV-2/FLU/RSV testing.  Fact Sheet for Patients: BloggerCourse.com  Fact Sheet for Healthcare Providers: SeriousBroker.it  This test is not yet approved or cleared by the [place]  FDA and has been authorized for  detection and/or diagnosis of SARS-CoV-2 by FDA under an Emergency Use Authorization (EUA). This EUA will remain in effect (meaning this test can be used) for the duration of the COVID-19 declaration under Section 564(b)(1) of the Act, 21 U.S.C. section 360bbb-3(b)(1), unless the authorization is terminated or revoked.     Resp Syncytial Virus by PCR NEGATIVE NEGATIVE Final    Comment: (NOTE) Fact Sheet for Patients: BloggerCourse.com  Fact Sheet for Healthcare Providers: SeriousBroker.it  This test is not yet approved or cleared by the [place]  FDA and has been authorized for detection and/or diagnosis of SARS-CoV-2 by FDA under an Emergency Use Authorization (EUA). This EUA will remain in effect (meaning this test can be used) for the duration of the COVID-19 declaration under Section 564(b)(1) of the Act, 21 U.S.C. section 360bbb-3(b)(1), unless the authorization is terminated or revoked.  Performed at [facility], [address] [place], [place] [address]      Radiology Studies: No results found.    Scheduled Meds:  dexamethasone   6 mg Oral Daily   heparin  injection (subcutaneous)  5,000 Units Subcutaneous Q8H   metoprolol  tartrate  25 mg Oral BID   pantoprazole   20 mg Oral Daily   polyethylene glycol  17 g Oral Daily   tamsulosin   0.4 mg Oral Daily   Continuous Infusions:   LOS: 6 days    Time spent:    Harlene Bowl DO [facility]   08/26/2024, 12:21 PM

## 2024-08-26 NOTE — Progress Notes (Signed)
 Daily Progress Note   Patient Name: Scott [name]       Date: 08/26/2024 DOB: [birth year]  Age: 89 y.o. MRN#: [id number] Attending Physician: Juvenal Harlene PENNER, DO Primary Care Physician: Nanci Senior, MD Admit Date: 08/20/2024  Reason for Consultation/Follow-up: Establishing goals of care  Subjective: I have reviewed medical records including EPIC notes, MAR, available advanced directives which is MOST form, and labs notable for creatinine 2.15>2.61, troponin 78, proBNP 14090, positive SARS COVID 19, UA negative UTI. Received report from primary RN - no acute concerns. RN reports patient has been drowsy this morning, oriented to self only, with minimal oral intake.    Went to visit patient at bedside - no family/visitors present. Patient was lying in bed asleep - I did not wake him to preserve comfort. No signs or non-verbal gestures of pain or discomfort noted. No respiratory distress, increased work of breathing, or secretions noted.   10:05 AM Called daughter/Scott [name] - emotional support provided. Provided updates per my and RN assessment this morning. Therapeutic listening provided as Scott [name] reflects on her visit with patient yesterday, indicating his intake was also poor during her visit, stating he's not eating much, only bites. She reflects over the course of his hospitalization and states it's been a roller coaster this week but it seems like everything is breaking down. She notes he has been hospitalized for 6 days without significant improvement and tells me we (herself and her brother) don't think he has the ability to improve, but we want to say we gave it all we could. She confirms goal to allow time for outcomes prior to making final decisions for comfort/hospice. Time limited trial of  24-48 hours were recommended and she agrees she and her brother will have final decisions within this time frame, as they do not want patient undergoing treatments if they will not offer quality of life. We discussed medical pathways including interventions aimed at prolonging life vs comfort/hospice.  Discussed that the goal of rehab is improvement/stabalization of functional status,  which can be a difficult goal to meet for patients with advanced illness and multiple medical conditions. Reviewed what is needed for someone to have a positive rehabilitation experience to include adequate nutritional intake as well as willingness/ability to participate. Scott [name] recognizes that at this time, patient is likely not able to meaningfully  participate long term and he does not have the appropriate nutritional intake. She agrees and states, I don't see him going to rehab.  Scott [name] states she and her brother have been discussing information and if patient doesn't show significant improvement, or declines, they are open for his transition to full comfort/hospice care.  Labs in context of kidney function and vital signs reviewed.  All questions and concerns addressed. Encouraged to call with questions and/or concerns. PMT number provided.  Length of Stay: 6  Current Medications: Scheduled Meds:   dexamethasone   6 mg Oral Daily   heparin  injection (subcutaneous)  5,000 Units Subcutaneous Q8H   metoprolol  tartrate  25 mg Oral BID   pantoprazole   20 mg Oral Daily   polyethylene glycol  17 g Oral Daily   tamsulosin   0.4 mg Oral Daily    Continuous Infusions:   PRN Meds: fluticasone  furoate-vilanterol, ipratropium-albuterol   Physical Exam Vitals and nursing note reviewed.  Constitutional:      General: He is not in acute distress.    Appearance: He is ill-appearing.  Pulmonary:     Effort: No respiratory distress.  Skin:    General: Skin is warm and dry.  Neurological:     Motor: Weakness present.              Vital Signs: BP (!) 169/78   Pulse 65   Temp (!) 97.5 F (36.4 C) (Oral)   Resp 18   Ht 5' 5 (1.651 m)   Wt 87.6 kg   SpO2 100%   BMI 32.14 kg/m  SpO2: SpO2: 100 % O2 Device: O2 Device: Nasal Cannula O2 Flow Rate: O2 Flow Rate (L/min): 4 L/min  Intake/output summary:  Intake/Output Summary (Last 24 hours) at 08/26/2024 0944 Last data filed at 08/26/2024 0402 Gross per 24 hour  Intake 600 ml  Output 850 ml  Net -250 ml   LBM: Last BM Date : 08/22/24 Baseline Weight: Weight: 87.4 kg Most recent weight: Weight: 87.6 kg       Palliative Assessment/Data: PPS 20%      Patient Active Problem List   Diagnosis Date Noted   Acute on chronic congestive heart failure (HCC) 08/25/2024   Palliative care encounter 08/25/2024   COVID-19 virus infection 08/21/2024   Acute respiratory failure with hypoxemia (HCC) 08/21/2024   AKI (acute kidney injury) (HCC) 08/21/2024   Acute metabolic encephalopathy 08/21/2024   Acute CHF (congestive heart failure) (HCC) 08/20/2024   Chronic diastolic CHF (congestive heart failure) (HCC) 05/25/2024   Abnormal ECG 05/25/2024   Syncope and collapse 05/25/2024   Acute on chronic diastolic CHF (congestive heart failure) (HCC) 05/24/2024   Thrombocytopenia (HCC)    SOB (shortness of breath)    Osteoarthritis    Obesity    Hypertension    H/O echocardiogram    GERD (gastroesophageal reflux disease)    Cranial neuropathy    Generalized OA    Cancer (HCC)    CAD (coronary artery disease)    BPH (benign prostatic hyperplasia)    Anxiety    Anemia    Allergic rhinitis 09/09/2015   Constipation 09/09/2015   Erectile dysfunction due to arterial insufficiency 09/09/2015   Pure hypercholesterolemia 09/09/2015   Osteoarthritis of hip 09/09/2015   Osteoarthritis of knee 09/09/2015   Malignant neoplasm of skin 09/09/2015   Screening for gout 01/06/2015   Inguinal hernia unilateral, non-recurrent, left 03/22/2013   Umbilical hernia,  asymptomatic 03/22/2013   DOE (dyspnea on exertion) 05/17/2012   Obstructive sleep  apnea 01/06/2008   Insomnia 01/06/2008    Palliative Care Assessment & Plan   Patient Profile: Scott [name] is a 89 y.o. male with multiple medical problems including CAD/CABG, chronic diastolic dysfunction with history of CHF, CKD, COPD, OSA noncompliant with CPAP, who was admitted to the hospital 08/21/2024 with acute hypoxic and hypercapnic respiratory failure.  Patient with COVID-positive.  Palliative care was consulted to address goals.   Assessment: Principal Problem:   Acute CHF (congestive heart failure) (HCC) Active Problems:   COVID-19 virus infection   Acute respiratory failure with hypoxemia (HCC)   AKI (acute kidney injury) (HCC)   Acute metabolic encephalopathy   Acute on chronic congestive heart failure (HCC)   Palliative care encounter   Concern about end of life   Decreased oral intake  Recommendations/Plan: Continue conservative management to treat the treatable Continue DNR-Limited as previously documented Family would like to allow 24-48 hours of watchful waiting. If no significant improvement, or decline, they are open to his transition to comfort/hospice care PMT will continue to follow and support holistically  Goals of Care and Additional Recommendations: Limitations on Scope of Treatment: No Tracheostomy  Code Status:    Code Status Orders  (From admission, onward)           Start     Ordered   08/21/24 0455  Do not attempt resuscitation (DNR)- Limited -Do Not Intubate (DNI)  Continuous       Question Answer Comment  If pulseless and not breathing No CPR or chest compressions.   In Pre-Arrest Conditions (Patient Is Breathing and Has A Pulse) Do not intubate. Provide all appropriate non-invasive medical interventions. Avoid ICU transfer unless indicated or required.   Consent: Discussion documented in EHR or advanced directives reviewed      08/21/24 0502            Code Status History     Date Active Date Inactive Code Status Order ID Comments User Context   08/21/2024 0448 08/21/2024 0502 Limited: Do not attempt resuscitation (DNR) -DNR-LIMITED -Do Not Intubate/DNI  [id number]  Alfornia Madison, MD Inpatient   05/25/2024 1541 05/27/2024 1615 Full Code [id number]  Seena Marsa NOVAK, MD Inpatient   04/17/2019 0225 04/18/2019 1912 Full Code [id number]  Hilma Rankins, MD ED   12/11/2012 1502 12/14/2012 1352 Full Code [id number]  Julian Sandor BRAVO, RN Inpatient      Advance Directive Documentation    Flowsheet Row Most Recent Value  Type of Advance Directive Healthcare Power of Attorney, Out of facility DNR (pink MOST or yellow form)  Pre-existing out of facility DNR order (yellow form or pink MOST form) --  MOST Form in Place? --    Prognosis:  Guarded to poor  Discharge Planning: To Be Determined  Care plan was discussed with primary RN, Dr. Juvenal, patient's daughter  Thank you for allowing the Palliative Medicine Team to assist in the care of this patient.     Scott CHRISTELLA Sharps, NP  Please contact Palliative Medicine Team phone at [phone] for questions and concerns.   *Portions of this note are a verbal dictation therefore any spelling and/or grammatical errors are due to the Dragon Medical One system interpretation.

## 2024-08-27 DIAGNOSIS — U071 COVID-19: Secondary | ICD-10-CM | POA: Diagnosis not present

## 2024-08-27 DIAGNOSIS — Z515 Encounter for palliative care: Secondary | ICD-10-CM | POA: Diagnosis not present

## 2024-08-27 DIAGNOSIS — J9601 Acute respiratory failure with hypoxia: Secondary | ICD-10-CM | POA: Diagnosis not present

## 2024-08-27 DIAGNOSIS — I509 Heart failure, unspecified: Secondary | ICD-10-CM | POA: Diagnosis not present

## 2024-08-27 LAB — BASIC METABOLIC PANEL WITH GFR
Anion gap: 8 (ref 5–15)
BUN: 97 mg/dL — ABNORMAL HIGH (ref 8–23)
CO2: 36 mmol/L — ABNORMAL HIGH (ref 22–32)
Calcium: 8.5 mg/dL — ABNORMAL LOW (ref 8.9–10.3)
Chloride: 92 mmol/L — ABNORMAL LOW (ref 98–111)
Creatinine, Ser: 1.91 mg/dL — ABNORMAL HIGH (ref 0.61–1.24)
GFR, Estimated: 32 mL/min — ABNORMAL LOW (ref >60)
Glucose, Bld: 154 mg/dL — ABNORMAL HIGH (ref 70–99)
Potassium: 5.2 mmol/L — ABNORMAL HIGH (ref 3.5–5.1)
Sodium: 136 mmol/L (ref 135–145)

## 2024-08-27 MED ORDER — SODIUM ZIRCONIUM CYCLOSILICATE 5 G PO PACK
5.0000 g | PACK | Freq: Every day | ORAL | Status: DC
  Administered 2024-08-27: 5 g via ORAL
  Filled 2024-08-27 (×3): qty 1

## 2024-08-27 NOTE — Progress Notes (Signed)
 PROGRESS NOTE    Scott [name]  FMW:[id number] DOB: October 12, 1928 DOA: 08/20/2024 PCP: Nanci Senior, MD    89/M with history of CAD/CABG, chronic diastolic CHF, CKD, COPD, OSA noncompliant with CPAP, presented to the ED with worsening swelling, weight gain, confusion.  In the emergency room noted to be hypoxic 72%, ABG noted severe hypercarbia, placed on BiPAP and diuretics, labs with creatinine of 2.7, troponin 83, proBNP 14090, SARS COVID PCR positive, CT chest with bilateral pleural effusions. Slow improvement in O2 and AKI.   Subjective: Mental status continue to improve  Assessment and Plan:  Acute on chronic hypoxic and hypercarbic respiratory failure Acute metabolic encephalopathy -Multifactorial, primarily CHF exacerbation, underlying OSA OHS with noncompliance to CPAP also contributing - nightly CPAP - Blood pressure is not tolerating diuresis with IV Lasix  twice daily so will decrease dose -repeat echo with preserved EF, moderately reduced RV, severely elevated PASP - Dr. Fairy discussed with daughter- plan for conservative management-if worsens may need transition to comfort care - Increase activity, PT OT eval- SNF versus hospice-- slowly improving  Acute on chronic diastolic CHF RV failure, pulmonary hypertension -Echo and diuretics as noted above, conservative management   SARS COVID-19 positive - Dr. Fairy doubts this is contributing to his symptoms, CT findings primarily with pleural effusions - Decadron  for now, changed to p.o. and finish course  Elevated troponin Secondary to demand ischemia, no ACS  AKI on CKD 3 A - Cardiorenal, initially improving with diuresis, creatinine 2.7 on admission  - Initially dropped but now trending up so we will hold any further lasix  - Hold on any IV fluids and trend  Abnormal LFTs - Appears resolved  COPD - Continue nebs  Hospital induced delirium - Delirium precautions  Hyperkalemia -lokelma  x 1  PT eval  recommending skilled nursing facility currently Palliative care consult for GOC    DVT prophylaxis: Not currently on Lovenox--appears to have a contraindication/allergy-Will add TED hose--after discussion with pharmacy will do a trial of heparin  Code Status: DNR Family Communication: Called daughter 3 Disposition Plan: To be determined-- SNF if improves     Objective: Vitals:   08/27/24 0100 08/27/24 0618 08/27/24 0900 08/27/24 1113  BP: (!) 123/96 122/68 113/68 129/67  Pulse: 63 (!) 57 63 67  Resp: 20 17 12    Temp: 97.9 F (36.6 C) 98.1 F (36.7 C) 98 F (36.7 C)   TempSrc: Oral Oral Oral   SpO2: 95% 100% 100%   Weight:      Height:        Intake/Output Summary (Last 24 hours) at 08/27/2024 1140 Last data filed at 08/26/2024 2100 Gross per 24 hour  Intake 600 ml  Output 300 ml  Net 300 ml   Filed Weights   08/24/24 0538 08/25/24 0427 08/26/24 0401  Weight: 87.5 kg 87.3 kg 87.6 kg    Examination:   General: Appearance:    Obese elderly male in no acute distress     Lungs:     respirations unlabored, on [place]  Heart:    Normal heart rate.   MS:   All extremities are intact.   Neurologic:   Awake, alert-pleasant       Data Reviewed:   CBC: Recent Labs  Lab 08/20/24 1526 08/20/24 2034 08/22/24 0221 08/23/24 0226 08/24/24 0223 08/25/24 0234 08/26/24 0206  WBC 8.4   < > 8.6 10.1 10.7* 9.9 7.0  NEUTROABS 5.7  --   --   --   --   --   --  HGB 13.2   < > 12.9* 10.8* 12.0* 12.1* 11.6*  HCT 42.2   < > 44.5 36.0* 39.1 39.2 37.4*  MCV 96.3   < > 102.3* 99.7 97.0 97.0 97.1  PLT 127*   < > 148* 111* 110* 96* 82*   < > = values in this interval not displayed.   Basic Metabolic Panel: Recent Labs  Lab 08/23/24 0226 08/24/24 0223 08/25/24 0234 08/26/24 0206 08/27/24 0248  NA 137 136 135 136 136  K 4.5 4.8 5.0 5.1 5.2*  CL 90* 92* 92* 96* 92*  CO2 36* 30 30 33* 36*  GLUCOSE 141* 128* 99 125* 154*  BUN 68* 84* 89* 94* 97*  CREATININE 2.87* 3.41*  2.61* 2.15* 1.91*  CALCIUM 8.5* 8.8* 8.9 8.5* 8.5*   GFR: Estimated Creatinine Clearance: 24.1 mL/min (A) (by C-G formula based on SCr of 1.91 mg/dL (H)). Liver Function Tests: Recent Labs  Lab 08/20/24 1526 08/21/24 0302 08/22/24 0221  AST 60* 49* 41  ALT 45* 41 39  ALKPHOS 89 66 61  BILITOT 2.0* 1.8* 1.9*  PROT 7.0 6.5 6.7  ALBUMIN 4.2 3.7 3.6   No results for input(s): LIPASE, AMYLASE in the last 168 hours. No results for input(s): AMMONIA in the last 168 hours. Coagulation Profile: No results for input(s): INR, PROTIME in the last 168 hours. Cardiac Enzymes: No results for input(s): CKTOTAL, CKMB, CKMBINDEX, TROPONINI in the last 168 hours. BNP (last 3 results) Recent Labs    05/24/24 1702 08/20/24 1526  PROBNP 2,422.0* 14,090.0*   HbA1C: No results for input(s): HGBA1C in the last 72 hours. CBG: Recent Labs  Lab 08/20/24 2028  GLUCAP 109*   Lipid Profile: No results for input(s): CHOL, HDL, LDLCALC, TRIG, CHOLHDL, LDLDIRECT in the last 72 hours. Thyroid  Function Tests: No results for input(s): TSH, T4TOTAL, FREET4, T3FREE, THYROIDAB in the last 72 hours. Anemia Panel: No results for input(s): VITAMINB12, FOLATE, FERRITIN, TIBC, IRON, RETICCTPCT in the last 72 hours.  Urine analysis:    Component Value Date/Time   COLORURINE YELLOW 08/24/2024 1901   APPEARANCEUR HAZY (A) 08/24/2024 1901   APPEARANCEUR Clear 09/28/2023 0912   LABSPEC 1.016 08/24/2024 1901   PHURINE 5.0 08/24/2024 1901   GLUCOSEU NEGATIVE 08/24/2024 1901   HGBUR NEGATIVE 08/24/2024 1901   BILIRUBINUR NEGATIVE 08/24/2024 1901   BILIRUBINUR Negative 09/28/2023 0912   KETONESUR NEGATIVE 08/24/2024 1901   PROTEINUR 30 (A) 08/24/2024 1901   NITRITE NEGATIVE 08/24/2024 1901   LEUKOCYTESUR NEGATIVE 08/24/2024 1901    ) Recent Results (from the past 240 hours)  Resp panel by RT-PCR (RSV, Flu A&B, Covid) Anterior Nasal Swab     Status:  Abnormal   Collection Time: 08/20/24  3:26 PM   Specimen: Anterior Nasal Swab  Result Value Ref Range Status   SARS Coronavirus 2 by RT PCR POSITIVE (A) NEGATIVE Final    Comment: (NOTE) SARS-CoV-2 target nucleic acids are DETECTED.  The SARS-CoV-2 RNA is generally detectable in upper respiratory specimens during the acute phase of infection. Positive results are indicative of the presence of the identified virus, but do not rule out bacterial infection or co-infection with other pathogens not detected by the test. Clinical correlation with patient history and other diagnostic information is necessary to determine patient infection status. The expected result is Negative.  Fact Sheet for Patients: BloggerCourse.com  Fact Sheet for Healthcare Providers: SeriousBroker.it  This test is not yet approved or cleared by the [place]  FDA and  has been authorized  for detection and/or diagnosis of SARS-CoV-2 by FDA under an Emergency Use Authorization (EUA).  This EUA will remain in effect (meaning this test can be used) for the duration of  the COVID-19 declaration under Section 564(b)(1) of the A ct, 21 U.S.C. section 360bbb-3(b)(1), unless the authorization is terminated or revoked sooner.     Influenza A by PCR NEGATIVE NEGATIVE Final   Influenza B by PCR NEGATIVE NEGATIVE Final    Comment: (NOTE) The Xpert Xpress SARS-CoV-2/FLU/RSV plus assay is intended as an aid in the diagnosis of influenza from Nasopharyngeal swab specimens and should not be used as a sole basis for treatment. Nasal washings and aspirates are unacceptable for Xpert Xpress SARS-CoV-2/FLU/RSV testing.  Fact Sheet for Patients: BloggerCourse.com  Fact Sheet for Healthcare Providers: SeriousBroker.it  This test is not yet approved or cleared by the [place]  FDA and has been authorized for detection  and/or diagnosis of SARS-CoV-2 by FDA under an Emergency Use Authorization (EUA). This EUA will remain in effect (meaning this test can be used) for the duration of the COVID-19 declaration under Section 564(b)(1) of the Act, 21 U.S.C. section 360bbb-3(b)(1), unless the authorization is terminated or revoked.     Resp Syncytial Virus by PCR NEGATIVE NEGATIVE Final    Comment: (NOTE) Fact Sheet for Patients: BloggerCourse.com  Fact Sheet for Healthcare Providers: SeriousBroker.it  This test is not yet approved or cleared by the [place]  FDA and has been authorized for detection and/or diagnosis of SARS-CoV-2 by FDA under an Emergency Use Authorization (EUA). This EUA will remain in effect (meaning this test can be used) for the duration of the COVID-19 declaration under Section 564(b)(1) of the Act, 21 U.S.C. section 360bbb-3(b)(1), unless the authorization is terminated or revoked.  Performed at [facility], [address], [place], [place] [address]      Radiology Studies: No results found.    Scheduled Meds:  heparin  injection (subcutaneous)  5,000 Units Subcutaneous Q8H   metoprolol  tartrate  25 mg Oral BID   pantoprazole   20 mg Oral Daily   polyethylene glycol  17 g Oral Daily   sodium zirconium cyclosilicate   5 g Oral Daily   tamsulosin   0.4 mg Oral Daily   Continuous Infusions:   LOS: 7 days    Time spent:    Harlene Bowl DO [facility]   08/27/2024, 11:40 AM

## 2024-08-27 NOTE — Progress Notes (Signed)
 Daily Progress Note   Patient Name: Scott [name]       Date: 08/27/2024 DOB: [birth year]  Age: 89 y.o. MRN#: [id number] Attending Physician: Juvenal Harlene PENNER, DO Primary Care Physician: Nanci Senior, MD Admit Date: 08/20/2024  Reason for Consultation/Follow-up: Establishing goals of care  Subjective: I have reviewed medical records including EPIC notes, MAR, any available advanced directives as necessary, and labs. Received report from primary RN - no acute concerns. RN reports patient remains confused but is more awake today. RN states patient has been up to the chair, ambulated in the hall with PT, is tolerating weaning oxygen, and eating/drinking well.   Went to visit patient at bedside - no family/visitors present. Patient was sitting up in chair asleep - I did not attempt to wake him to preserve comfort. No signs or non-verbal gestures of pain or discomfort noted. No respiratory distress, increased work of breathing, or secretions noted.   12:45 PM Attempted to call daughter/Fay to offer ongoing support and updates on RN and my assessment from today - no answer - confidential voicemail left and PMT phone number provided with request to return call.  Patient seems to be making slow improvement.  All questions and concerns addressed. Encouraged to call with questions and/or concerns. PMT number previously provided.  Length of Stay: 7  Current Medications: Scheduled Meds:   heparin  injection (subcutaneous)  5,000 Units Subcutaneous Q8H   metoprolol  tartrate  25 mg Oral BID   pantoprazole   20 mg Oral Daily   polyethylene glycol  17 g Oral Daily   sodium zirconium cyclosilicate   5 g Oral Daily   tamsulosin   0.4 mg Oral Daily    Continuous Infusions:   PRN Meds: fluticasone   furoate-vilanterol, ipratropium-albuterol   Physical Exam Vitals and nursing note reviewed.  Constitutional:      General: He is not in acute distress.    Appearance: He is ill-appearing.  Pulmonary:     Effort: No respiratory distress.  Skin:    General: Skin is warm and dry.  Neurological:     Motor: Weakness present.             Vital Signs: BP 129/67   Pulse 67   Temp 98 F (36.7 C) (Oral)   Resp 12   Ht 5' 5 (1.651 m)   Wt 87.6  kg   SpO2 97%   BMI 32.14 kg/m  SpO2: SpO2: 97 % O2 Device: O2 Device: Nasal Cannula O2 Flow Rate: O2 Flow Rate (L/min): 2 L/min  Intake/output summary:  Intake/Output Summary (Last 24 hours) at 08/27/2024 1240 Last data filed at 08/27/2024 1240 Gross per 24 hour  Intake 600 ml  Output 700 ml  Net -100 ml   LBM: Last BM Date : 08/25/24 (Per chart, pt unable to remember) Baseline Weight: Weight: 87.4 kg Most recent weight: Weight: 87.6 kg       Palliative Assessment/Data: PPS 50%      Patient Active Problem List   Diagnosis Date Noted   Acute on chronic congestive heart failure (HCC) 08/25/2024   Palliative care encounter 08/25/2024   COVID-19 virus infection 08/21/2024   Acute respiratory failure with hypoxemia (HCC) 08/21/2024   AKI (acute kidney injury) (HCC) 08/21/2024   Acute metabolic encephalopathy 08/21/2024   Acute CHF (congestive heart failure) (HCC) 08/20/2024   Chronic diastolic CHF (congestive heart failure) (HCC) 05/25/2024   Abnormal ECG 05/25/2024   Syncope and collapse 05/25/2024   Acute on chronic diastolic CHF (congestive heart failure) (HCC) 05/24/2024   Thrombocytopenia (HCC)    SOB (shortness of breath)    Osteoarthritis    Obesity    Hypertension    H/O echocardiogram    GERD (gastroesophageal reflux disease)    Cranial neuropathy    Generalized OA    Cancer (HCC)    CAD (coronary artery disease)    BPH (benign prostatic hyperplasia)    Anxiety    Anemia    Allergic rhinitis 09/09/2015    Constipation 09/09/2015   Erectile dysfunction due to arterial insufficiency 09/09/2015   Pure hypercholesterolemia 09/09/2015   Osteoarthritis of hip 09/09/2015   Osteoarthritis of knee 09/09/2015   Malignant neoplasm of skin 09/09/2015   Screening for gout 01/06/2015   Inguinal hernia unilateral, non-recurrent, left 03/22/2013   Umbilical hernia, asymptomatic 03/22/2013   DOE (dyspnea on exertion) 05/17/2012   Obstructive sleep apnea 01/06/2008   Insomnia 01/06/2008    Palliative Care Assessment & Plan   Patient Profile: Scott [name] is a 89 y.o. male with multiple medical problems including CAD/CABG, chronic diastolic dysfunction with history of CHF, CKD, COPD, OSA noncompliant with CPAP, who was admitted to the hospital 08/21/2024 with acute hypoxic and hypercapnic respiratory failure. Patient with COVID-positive. Palliative care was consulted to address goals.   Assessment: Principal Problem:   Acute CHF (congestive heart failure) (HCC) Active Problems:   COVID-19 virus infection   Acute respiratory failure with hypoxemia (HCC)   AKI (acute kidney injury) (HCC)   Acute metabolic encephalopathy   Acute on chronic congestive heart failure (HCC)   Palliative care encounter  Recommendations/Plan: Continue conservative management to treat the treatable Continue DNR-Limited as previously documented Unable to speak with daughter today Per previous discussions, family would like to allow another 24 hours of watchful waiting (48 total). If no significant improvement, or decline, they are open to his transition to comfort/hospice care PMT will continue to follow and support holistically  Goals of Care and Additional Recommendations: Limitations on Scope of Treatment: No Tracheostomy  Code Status:    Code Status Orders  (From admission, onward)           Start     Ordered   08/21/24 0455  Do not attempt resuscitation (DNR)- Limited -Do Not Intubate (DNI)  Continuous        Question Answer Comment  If  pulseless and not breathing No CPR or chest compressions.   In Pre-Arrest Conditions (Patient Is Breathing and Has A Pulse) Do not intubate. Provide all appropriate non-invasive medical interventions. Avoid ICU transfer unless indicated or required.   Consent: Discussion documented in EHR or advanced directives reviewed      08/21/24 0502           Code Status History     Date Active Date Inactive Code Status Order ID Comments User Context   08/21/2024 0448 08/21/2024 0502 Limited: Do not attempt resuscitation (DNR) -DNR-LIMITED -Do Not Intubate/DNI  [id number]  Alfornia Madison, MD Inpatient   05/25/2024 1541 05/27/2024 1615 Full Code [id number]  Seena Marsa NOVAK, MD Inpatient   04/17/2019 0225 04/18/2019 1912 Full Code [id number]  Hilma Rankins, MD ED   12/11/2012 1502 12/14/2012 1352 Full Code [id number]  Julian Sandor BRAVO, RN Inpatient      Advance Directive Documentation    Flowsheet Row Most Recent Value  Type of Advance Directive Healthcare Power of Attorney, Out of facility DNR (pink MOST or yellow form)  Pre-existing out of facility DNR order (yellow form or pink MOST form) --  MOST Form in Place? --    Prognosis:  Guarded to poor  Discharge Planning: To Be Determined  Care plan was discussed with primary RN  Thank you for allowing the Palliative Medicine Team to assist in the care of this patient.   Total Time 35 minutes Prolonged Time Billed  no       Jeoffrey CHRISTELLA Sharps, NP  Please contact Palliative Medicine Team phone at [phone] for questions and concerns.   *Portions of this note are a verbal dictation therefore any spelling and/or grammatical errors are due to the Dragon Medical One system interpretation.

## 2024-08-27 NOTE — Progress Notes (Signed)
 Physical Therapy Treatment Patient Details Name: Scott [name] MRN: [id number] DOB: 09/14/1928 Today's Date: 08/27/2024   History of Present Illness 89 y.o. male adm 08/20/24 for DOE, bil pitting edema, COVID-19 (+), CHF exacerbation and encephalopathy. PMH: CAD, HFpEF, HTN, OSA, BPH    PT Comments  Pt pleasantly confused with family present at beginning of session stating pt normally cognitively intact and conversational. Pt remains confused thinking he is traveling back and forth from [place]. Pt with slowly improving mobility, gait and activity tolerance. Pt with finger probe unable to read SPO2, portable room monitor not working and unable to obtain pulse ox from pt when moving away from bed. Pt tolerated gait on RA but with rapid fatigue after 88' needing chair pulled to him. Pt with decline from baseline and limited family assist, plan remains appropriate. Patient will benefit from continued inpatient follow up therapy, <3 hours/day    If plan is discharge home, recommend the following: A little help with walking and/or transfers;A little help with bathing/dressing/bathroom;Assistance with cooking/housework;Direct supervision/assist for financial management;Supervision due to cognitive status;Assist for transportation;Help with stairs or ramp for entrance   Can travel by private vehicle     Yes  Equipment Recommendations  None recommended by PT    Recommendations for Other Services       Precautions / Restrictions Precautions Precautions: Fall;Other (comment) Recall of Precautions/Restrictions: Impaired Precaution/Restrictions Comments: watch SPO2     Mobility  Bed Mobility Overal bed mobility: Needs Assistance Bed Mobility: Supine to Sit     Supine to sit: HOB elevated, Used rails, Min assist     General bed mobility comments: HOB 30 degrees, use of rail, mod cues for sequence with min assist of pad to fully rotate pelvis to EOB    Transfers Overall transfer  level: Needs assistance   Transfers: Sit to/from Stand Sit to Stand: Min assist           General transfer comment: min assist to rise from bed and chair with cues for hand placement and safety    Ambulation/Gait Ambulation/Gait assistance: Contact guard assist Gait Distance (Feet): 88 Feet Assistive device: Rolling walker (2 wheels) Gait Pattern/deviations: Step-through pattern, Decreased stride length, Trunk flexed   Gait velocity interpretation: <1.8 ft/sec, indicate of risk for recurrent falls   General Gait Details: cues for posture, proximity to RW and direction to avoid obstacles. Pt with rapid decline end of gait with pt stating fatigue and required mod assist to control pt balance and pull chair to pt.   Stairs             Wheelchair Mobility     Tilt Bed    Modified Rankin (Stroke Patients Only)       Balance Overall balance assessment: Needs assistance Sitting-balance support: No upper extremity supported, Feet supported Sitting balance-Leahy Scale: Fair     Standing balance support: Bilateral upper extremity supported, During functional activity, Reliant on assistive device for balance Standing balance-Leahy Scale: Poor Standing balance comment: Reliant on RW                            Communication Communication Communication: Impaired Factors Affecting Communication: Hearing impaired  Cognition Arousal: Alert Behavior During Therapy: WFL for tasks assessed/performed   PT - Cognitive impairments: Orientation, Awareness, Memory, Problem solving, Safety/Judgement   Orientation impairments: Place, Time, Situation  Following commands: Impaired Following commands impaired: Follows one step commands with increased time, Follows one step commands inconsistently    Cueing Cueing Techniques: Verbal cues, Gestural cues  Exercises General Exercises - Lower Extremity Long Arc Quad: AROM, Both, 10 reps,  Strengthening, Seated Hip Flexion/Marching: AROM, Both, Seated, Strengthening, 10 reps    General Comments        Pertinent Vitals/Pain Pain Assessment Pain Assessment: No/denies pain    Home Living                          Prior Function            PT Goals (current goals can now be found in the care plan section) Progress towards PT goals: Progressing toward goals    Frequency    Min 2X/week      PT Plan      Co-evaluation              AM-PAC PT 6 Clicks Mobility   Outcome Measure  Help needed turning from your back to your side while in a flat bed without using bedrails?: A Little Help needed moving from lying on your back to sitting on the side of a flat bed without using bedrails?: A Little Help needed moving to and from a bed to a chair (including a wheelchair)?: A Little Help needed standing up from a chair using your arms (e.g., wheelchair or bedside chair)?: A Little Help needed to walk in hospital room?: A Little Help needed climbing 3-5 steps with a railing? : A Lot 6 Click Score: 17    End of Session Equipment Utilized During Treatment: Gait belt Activity Tolerance: Patient tolerated treatment well Patient left: in chair;with call bell/phone within reach;with chair alarm set;with nursing/sitter in room Nurse Communication: Mobility status PT Visit Diagnosis: Other abnormalities of gait and mobility (R26.89);Difficulty in walking, not elsewhere classified (R26.2);Muscle weakness (generalized) (M62.81)     Time: 8978-8952 PT Time Calculation (min) (ACUTE ONLY): 26 min  Charges:    $Gait Training: 8-22 mins $Therapeutic Activity: 8-22 mins PT General Charges $$ ACUTE PT VISIT: 1 Visit                     Lenoard SQUIBB, PT Acute Rehabilitation Services Office: [phone]    Lenoard NOVAK Johnanna Bakke 08/27/2024, 11:48 AM

## 2024-08-27 NOTE — Plan of Care (Signed)
  Problem: Education: Goal: Knowledge of General Education information will improve Description: Including pain rating scale, medication(s)/side effects and non-pharmacologic comfort measures Outcome: Progressing   Problem: Activity: Goal: Risk for activity intolerance will decrease Outcome: Progressing   Problem: Elimination: Goal: Will not experience complications related to bowel motility Outcome: Progressing   Problem: Pain Managment: Goal: General experience of comfort will improve and/or be controlled Outcome: Progressing

## 2024-08-27 NOTE — TOC Progression Note (Addendum)
 Transition of Care [facility]) - Progression Note    Patient Details  Name: Scott [name] MRN: [id number] Date of Birth: February 01, 1928  [facility] CM/SW Contact  [name], [place] Phone Number: 08/27/2024, 10:45 AM  Clinical Narrative:   Per accepting and considering status SNFs, patient will need to isolate for 10 days prior to discharging to a SNF. CSW to follow up closer to patients COVID isolation ending.   2:39 PM CSW emailed [name] the list of current SNFs that may be able to accept patient once his COVID isolation period is over.    CSW will continue to follow.      Expected Discharge Plan: Skilled Nursing Facility Barriers to Discharge: Continued Medical Work up, SNF Pending bed offer, Insurance Authorization               Expected Discharge Plan and Services In-house Referral: Clinical Social Work     Living arrangements for the past 2 months: Apartment                                       Social Drivers of Health (SDOH) Interventions SDOH Screenings   Food Insecurity: Patient Unable To Answer (08/21/2024)  Housing: Unknown (08/21/2024)  Transportation Needs: Patient Unable To Answer (08/21/2024)  Utilities: Not At Risk (05/26/2024)  Social Connections: Unknown (08/21/2024)  Tobacco Use: Low Risk  (08/20/2024)    Readmission Risk Interventions    08/21/2024    9:52 AM  Readmission Risk Prevention Plan  Transportation Screening Complete  PCP or Specialist Appt within 5-7 Days Complete  Home Care Screening Complete  Medication Review (RN CM) Complete

## 2024-08-28 DIAGNOSIS — J9601 Acute respiratory failure with hypoxia: Secondary | ICD-10-CM | POA: Diagnosis not present

## 2024-08-28 DIAGNOSIS — Z515 Encounter for palliative care: Secondary | ICD-10-CM | POA: Diagnosis not present

## 2024-08-28 DIAGNOSIS — I509 Heart failure, unspecified: Secondary | ICD-10-CM | POA: Diagnosis not present

## 2024-08-28 DIAGNOSIS — U071 COVID-19: Secondary | ICD-10-CM | POA: Diagnosis not present

## 2024-08-28 LAB — BASIC METABOLIC PANEL WITH GFR
Anion gap: 9 (ref 5–15)
BUN: 102 mg/dL — ABNORMAL HIGH (ref 8–23)
CO2: 35 mmol/L — ABNORMAL HIGH (ref 22–32)
Calcium: 8.6 mg/dL — ABNORMAL LOW (ref 8.9–10.3)
Chloride: 93 mmol/L — ABNORMAL LOW (ref 98–111)
Creatinine, Ser: 1.89 mg/dL — ABNORMAL HIGH (ref 0.61–1.24)
GFR, Estimated: 32 mL/min — ABNORMAL LOW (ref >60)
Glucose, Bld: 111 mg/dL — ABNORMAL HIGH (ref 70–99)
Potassium: 5.3 mmol/L — ABNORMAL HIGH (ref 3.5–5.1)
Sodium: 137 mmol/L (ref 135–145)

## 2024-08-28 LAB — CBC
HCT: 38.6 % — ABNORMAL LOW (ref 39.0–52.0)
Hemoglobin: 11.7 g/dL — ABNORMAL LOW (ref 13.0–17.0)
MCH: 29.9 pg (ref 26.0–34.0)
MCHC: 30.3 g/dL (ref 30.0–36.0)
MCV: 98.7 fL (ref 80.0–100.0)
Platelets: 81 K/uL — ABNORMAL LOW (ref 150–400)
RBC: 3.91 MIL/uL — ABNORMAL LOW (ref 4.22–5.81)
RDW: 16.8 % — ABNORMAL HIGH (ref 11.5–15.5)
WBC: 9.7 K/uL (ref 4.0–10.5)
nRBC: 0 % (ref 0.0–0.2)

## 2024-08-28 MED ORDER — SODIUM ZIRCONIUM CYCLOSILICATE 5 G PO PACK
5.0000 g | PACK | Freq: Two times a day (BID) | ORAL | Status: DC
  Administered 2024-08-28 (×2): 5 g via ORAL
  Filled 2024-08-28 (×3): qty 1

## 2024-08-28 NOTE — Plan of Care (Signed)

## 2024-08-28 NOTE — Progress Notes (Signed)
 Daily Progress Note   Patient Name: Scott [name]       Date: 08/28/2024 DOB: 06-Sep-1928  Age: 89 y.o. MRN#: [id number] Attending Physician: Scott Harlene PENNER, DO Primary Care Physician: Scott Senior, MD Admit Date: 08/20/2024  Reason for Consultation/Follow-up: Establishing goals of care  Subjective: I have reviewed medical records including EPIC notes, MAR, and labs. Received report from primary RN - no acute concerns. RN reports patient remains confused, ate 50% of breakfast.   Scott [name] to visit patient at bedside - male visitor present. Patient was lying in bed awake, alert, and confused. No signs or non-verbal gestures of pain or discomfort noted. No respiratory distress, increased work of breathing, or secretions noted.   Called daughter/Scott [name] - emotional support provided. Discussed patient's slow, gradual improvement, though he remains confused. Scott [name] tells me, he is really out of it, confused. Therapeutic listening provided as she reflects on updates from attending yesterday and today. Family would like to allow more time for outcomes prior to making any final decisions regarding hospice/comfort care in context of slight improvement. If patient continues to improve, they are interested in his discharge to STR. She has questions regarding insurance and how many STR days patient qualifies for - will consult TOC for assistance in answering this question.   We discussed that it is uncertain how much patient will benefit from therapies in context of his confusion and frailty. Offered and explained outpatient Palliative Care role for ongoing GOC pending trajectory after discharge. She understands if patient declines, or if they feel he is not benefiting from rehab attempts, they can easily enroll him in  their hospice services at a later date. She will discuss this with her brother.   All questions and concerns addressed. Encouraged to call with questions and/or concerns. PMT number previously provided.  1:30 PM Received notification Scott [name] called PMT phone requesting return call.  Called Scott [name] - she has spoken with her brother and they have opted for patient's discharge to STR with outpatient Palliative Care to follow - TOC notified.  Informed [facility] liaison this would not be an urgent referral, but if possible, would recommend establishing with patient/family sooner than later to assist family navigating the uncertainty of coming weeks and family openness to hospice if he declines.  Length of Stay: 8  Current Medications: Scheduled Meds:  .  heparin  injection (subcutaneous)  5,000 Units Subcutaneous Q8H  . metoprolol  tartrate  25 mg Oral BID  . pantoprazole   20 mg Oral Daily  . polyethylene glycol  17 g Oral Daily  . sodium zirconium cyclosilicate   5 g Oral BID  . tamsulosin   0.4 mg Oral Daily    Continuous Infusions:   PRN Meds: fluticasone  furoate-vilanterol, ipratropium-albuterol   Physical Exam Vitals and nursing note reviewed.  Constitutional:      General: He is not in acute distress.    Appearance: He is ill-appearing.  Pulmonary:     Effort: No respiratory distress.  Skin:    General: Skin is warm and dry.  Neurological:     Motor: Weakness present.             Vital Signs: BP (!) 141/73 (BP Location: Left Arm)   Pulse 75   Temp 97.7 F (36.5 C) (Oral)   Resp 20   Ht 5' 5 (1.651 m)   Wt 87.6 kg   SpO2 96%   BMI 32.14 kg/m  SpO2: SpO2: 96 % O2 Device: O2 Device: Nasal Cannula O2 Flow Rate: O2 Flow Rate (L/min): 2 L/min  Intake/output summary:  Intake/Output Summary (Last 24 hours) at 08/28/2024 1143 Last data filed at 08/28/2024 0830 Gross per 24 hour  Intake 240 ml  Output 400 ml  Net -160 ml   LBM: Last BM Date : 08/25/24 Baseline Weight:  Weight: 87.4 kg Most recent weight: Weight: 87.6 kg       Palliative Assessment/Data: PPS 50%      Patient Active Problem List   Diagnosis Date Noted  . Acute on chronic congestive heart failure (HCC) 08/25/2024  . Palliative care encounter 08/25/2024  . COVID-19 virus infection 08/21/2024  . Acute respiratory failure with hypoxemia (HCC) 08/21/2024  . AKI (acute kidney injury) 08/21/2024  . Acute metabolic encephalopathy 08/21/2024  . Acute CHF (congestive heart failure) (HCC) 08/20/2024  . Chronic diastolic CHF (congestive heart failure) (HCC) 05/25/2024  . Abnormal ECG 05/25/2024  . Syncope and collapse 05/25/2024  . Acute on chronic diastolic CHF (congestive heart failure) (HCC) 05/24/2024  . Thrombocytopenia   . SOB (shortness of breath)   . Osteoarthritis   . Obesity   . Hypertension   . H/O echocardiogram   . GERD (gastroesophageal reflux disease)   . Cranial neuropathy   . Generalized OA   . Cancer (HCC)   . CAD (coronary artery disease)   . BPH (benign prostatic hyperplasia)   . Anxiety   . Anemia   . Allergic rhinitis 09/09/2015  . Constipation 09/09/2015  . Erectile dysfunction due to arterial insufficiency 09/09/2015  . Pure hypercholesterolemia 09/09/2015  . Osteoarthritis of hip 09/09/2015  . Osteoarthritis of knee 09/09/2015  . Malignant neoplasm of skin 09/09/2015  . Screening for gout 01/06/2015  . Inguinal hernia unilateral, non-recurrent, left 03/22/2013  . Umbilical hernia, asymptomatic 03/22/2013  . DOE (dyspnea on exertion) 05/17/2012  . Obstructive sleep apnea 01/06/2008  . Insomnia 01/06/2008    Palliative Care Assessment & Plan   Patient Profile: Scott [name] is a 89 y.o. male with multiple medical problems including CAD/CABG, chronic diastolic dysfunction with history of CHF, CKD, COPD, OSA noncompliant with CPAP, who was admitted to the hospital 08/21/2024 with acute hypoxic and hypercapnic respiratory failure. Patient with  COVID-positive. Palliative care was consulted to address goals.   Assessment: Principal Problem:   Acute CHF (congestive heart failure) (HCC) Active Problems:   COVID-19 virus infection  Acute respiratory failure with hypoxemia (HCC)   AKI (acute kidney injury)   Acute metabolic encephalopathy   Acute on chronic congestive heart failure [facility])   Palliative care encounter   Recommendations/Plan: Continue conservative management to treat the treatable Continue DNR-Limited as previously documented Goal is for patient's discharge to STR with outpatient Palliative Care to follow. If patient declines or is not finding benefit with rehabilitation efforts, they will likely enroll patient with hospice over rehospitalization TOC notified and consulted for: daughter with insurance questions and needing outpatient Palliative Care referral PMT will continue to follow and support holistically  Goals of Care and Additional Recommendations: Limitations on Scope of Treatment: Full Scope Treatment and No Tracheostomy  Code Status:    Code Status Orders  (From admission, onward)           Start     Ordered   08/21/24 0455  Do not attempt resuscitation (DNR)- Limited -Do Not Intubate (DNI)  Continuous       Question Answer Comment  If pulseless and not breathing No CPR or chest compressions.   In Pre-Arrest Conditions (Patient Is Breathing and Has A Pulse) Do not intubate. Provide all appropriate non-invasive medical interventions. Avoid ICU transfer unless indicated or required.   Consent: Discussion documented in EHR or advanced directives reviewed      08/21/24 0502           Code Status History     Date Active Date Inactive Code Status Order ID Comments User Context   08/21/2024 0448 08/21/2024 0502 Limited: Do not attempt resuscitation (DNR) -DNR-LIMITED -Do Not Intubate/DNI  [id number]  Alfornia Madison, MD Inpatient   05/25/2024 1541 05/27/2024 1615 Full Code [id number]  Seena Marsa NOVAK, MD Inpatient   04/17/2019 0225 04/18/2019 1912 Full Code [id number]  Hilma Rankins, MD ED   12/11/2012 1502 12/14/2012 1352 Full Code [id number]  Julian Sandor BRAVO, RN Inpatient      Advance Directive Documentation    Flowsheet Row Most Recent Value  Type of Advance Directive Healthcare Power of Attorney, Out of facility DNR (pink MOST or yellow form)  Pre-existing out of facility DNR order (yellow form or pink MOST form) --  MOST Form in Place? --    Prognosis:  Unable to determine  Discharge Planning: Skilled Nursing Facility for rehab with Palliative care service follow-up  Care plan was discussed with primary RN, patient's daughter, TOC, Dr. Juvenal, [facility] liaison  Thank you for allowing the Palliative Medicine Team to assist in the care of this patient.   Total Time 35 minutes Prolonged Time Billed  no       Jeoffrey CHRISTELLA Sharps, NP  Please contact Palliative Medicine Team phone at [phone] for questions and concerns.   *Portions of this note are a verbal dictation therefore any spelling and/or grammatical errors are due to the Dragon Medical One system interpretation.

## 2024-08-28 NOTE — Progress Notes (Signed)
 PROGRESS NOTE    Scott [name]  FMW:[id number] DOB: 10/23/1928 DOA: 08/20/2024 PCP: Nanci Senior, MD    89/M with history of CAD/CABG, chronic diastolic CHF, CKD, COPD, OSA noncompliant with CPAP, presented to the ED with worsening swelling, weight gain, confusion.  In the emergency room noted to be hypoxic 72%, ABG noted severe hypercarbia, placed on BiPAP and diuretics, labs with creatinine of 2.7, troponin 83, proBNP 14090, SARS COVID PCR positive, CT chest with bilateral pleural effusions. Slow improvement in O2 and AKI.  Mental status continues to wax and wane.  Family monitoring and if he does not improve to be able to go to skilled nursing facility they will consider comfort care   Subjective: Hungry this a.m., still confused  Assessment and Plan:  Acute on chronic hypoxic and hypercarbic respiratory failure Acute metabolic encephalopathy -Multifactorial, primarily CHF exacerbation, underlying OSA OHS with noncompliance to CPAP also contributing - nightly CPAP -repeat echo with preserved EF, moderately reduced RV, severely elevated PASP - Dr. Fairy discussed with daughter- plan for conservative management-if worsens may need transition to comfort care - Increase activity, PT/OT eval- SNF versus hospice-- slowly improving--- patient cannot go to SNF until 10 days post COVID  Acute on chronic diastolic CHF RV failure, pulmonary hypertension -Echo above -Patient developed acute kidney injury with IV Lasix  so have held Lasix  and allow patient to drink to bring creatinine down   SARS COVID-19 positive - Not sure this is contributing to his symptoms, CT findings primarily with pleural effusions - Decadron  for now, changed to p.o. and finish course  Elevated troponin Secondary to demand ischemia, no ACS  AKI on CKD 3 A - Cardiorenal, initially improving with diuresis, creatinine 2.7 on admission  - Initially dropped but now trending up so we will hold any further lasix  -  Hold on any IV fluids and trend  Abnormal LFTs - Appears resolved  COPD - Continue nebs  Hospital induced delirium - Delirium precautions  Hyperkalemia -lokelma  daily  PT eval recommending skilled nursing facility currently Palliative care consult for GOC    DVT prophylaxis: Not currently on Lovenox--appears to have a contraindication/allergy-Will add TED hose--after discussion with pharmacy will do a trial of heparin  Code Status: DNR Family Communication: Called daughter 9/22-- please update Disposition Plan: To be determined-- SNF if improves     Objective: Vitals:   08/27/24 1613 08/27/24 2037 08/28/24 0135 08/28/24 0723  BP: 101/69 (!) 150/68 130/73 (!) 141/73  Pulse: 61 74 65 75  Resp: 20 (!) 21 16 20   Temp: 98.4 F (36.9 C) 98.1 F (36.7 C) 98.6 F (37 C) 97.7 F (36.5 C)  TempSrc: Oral Oral Oral Oral  SpO2: 98% 95% 90% 96%  Weight:      Height:        Intake/Output Summary (Last 24 hours) at 08/28/2024 1047 Last data filed at 08/28/2024 0830 Gross per 24 hour  Intake 240 ml  Output 400 ml  Net -160 ml   Filed Weights   08/24/24 0538 08/25/24 0427 08/26/24 0401  Weight: 87.5 kg 87.3 kg 87.6 kg    Examination:   General: Appearance:    Obese elderly male in no acute distress     Lungs:     respirations unlabored, on [place]  Heart:    Normal heart rate.   MS:   All extremities are intact.   Neurologic:   Awake, alert-pleasantly confused       Data Reviewed:   CBC: Recent Labs  Lab  08/23/24 0226 08/24/24 0223 08/25/24 0234 08/26/24 0206 08/28/24 0217  WBC 10.1 10.7* 9.9 7.0 9.7  HGB 10.8* 12.0* 12.1* 11.6* 11.7*  HCT 36.0* 39.1 39.2 37.4* 38.6*  MCV 99.7 97.0 97.0 97.1 98.7  PLT 111* 110* 96* 82* 81*   Basic Metabolic Panel: Recent Labs  Lab 08/24/24 0223 08/25/24 0234 08/26/24 0206 08/27/24 0248 08/28/24 0217  NA 136 135 136 136 137  K 4.8 5.0 5.1 5.2* 5.3*  CL 92* 92* 96* 92* 93*  CO2 30 30 33* 36* 35*  GLUCOSE 128* 99  125* 154* 111*  BUN 84* 89* 94* 97* 102*  CREATININE 3.41* 2.61* 2.15* 1.91* 1.89*  CALCIUM 8.8* 8.9 8.5* 8.5* 8.6*   GFR: Estimated Creatinine Clearance: 24.3 mL/min (A) (by C-G formula based on SCr of 1.89 mg/dL (H)). Liver Function Tests: Recent Labs  Lab 08/22/24 0221  AST 41  ALT 39  ALKPHOS 61  BILITOT 1.9*  PROT 6.7  ALBUMIN 3.6   No results for input(s): LIPASE, AMYLASE in the last 168 hours. No results for input(s): AMMONIA in the last 168 hours. Coagulation Profile: No results for input(s): INR, PROTIME in the last 168 hours. Cardiac Enzymes: No results for input(s): CKTOTAL, CKMB, CKMBINDEX, TROPONINI in the last 168 hours. BNP (last 3 results) Recent Labs    05/24/24 1702 08/20/24 1526  PROBNP 2,422.0* 14,090.0*   HbA1C: No results for input(s): HGBA1C in the last 72 hours. CBG: No results for input(s): GLUCAP in the last 168 hours.  Lipid Profile: No results for input(s): CHOL, HDL, LDLCALC, TRIG, CHOLHDL, LDLDIRECT in the last 72 hours. Thyroid  Function Tests: No results for input(s): TSH, T4TOTAL, FREET4, T3FREE, THYROIDAB in the last 72 hours. Anemia Panel: No results for input(s): VITAMINB12, FOLATE, FERRITIN, TIBC, IRON, RETICCTPCT in the last 72 hours.  Urine analysis:    Component Value Date/Time   COLORURINE YELLOW 08/24/2024 1901   APPEARANCEUR HAZY (A) 08/24/2024 1901   APPEARANCEUR Clear 09/28/2023 0912   LABSPEC 1.016 08/24/2024 1901   PHURINE 5.0 08/24/2024 1901   GLUCOSEU NEGATIVE 08/24/2024 1901   HGBUR NEGATIVE 08/24/2024 1901   BILIRUBINUR NEGATIVE 08/24/2024 1901   BILIRUBINUR Negative 09/28/2023 0912   KETONESUR NEGATIVE 08/24/2024 1901   PROTEINUR 30 (A) 08/24/2024 1901   NITRITE NEGATIVE 08/24/2024 1901   LEUKOCYTESUR NEGATIVE 08/24/2024 1901     Recent Results (from the past 240 hours)  Resp panel by RT-PCR (RSV, Flu A&B, Covid) Anterior Nasal Swab     Status:  Abnormal   Collection Time: 08/20/24  3:26 PM   Specimen: Anterior Nasal Swab  Result Value Ref Range Status   SARS Coronavirus 2 by RT PCR POSITIVE (A) NEGATIVE Final    Comment: (NOTE) SARS-CoV-2 target nucleic acids are DETECTED.  The SARS-CoV-2 RNA is generally detectable in upper respiratory specimens during the acute phase of infection. Positive results are indicative of the presence of the identified virus, but do not rule out bacterial infection or co-infection with other pathogens not detected by the test. Clinical correlation with patient history and other diagnostic information is necessary to determine patient infection status. The expected result is Negative.  Fact Sheet for Patients: BloggerCourse.com  Fact Sheet for Healthcare Providers: SeriousBroker.it  This test is not yet approved or cleared by the [place]  FDA and  has been authorized for detection and/or diagnosis of SARS-CoV-2 by FDA under an Emergency Use Authorization (EUA).  This EUA will remain in effect (meaning this test can be used) for the  duration of  the COVID-19 declaration under Section 564(b)(1) of the A ct, 21 U.S.C. section 360bbb-3(b)(1), unless the authorization is terminated or revoked sooner.     Influenza A by PCR NEGATIVE NEGATIVE Final   Influenza B by PCR NEGATIVE NEGATIVE Final    Comment: (NOTE) The Xpert Xpress SARS-CoV-2/FLU/RSV plus assay is intended as an aid in the diagnosis of influenza from Nasopharyngeal swab specimens and should not be used as a sole basis for treatment. Nasal washings and aspirates are unacceptable for Xpert Xpress SARS-CoV-2/FLU/RSV testing.  Fact Sheet for Patients: BloggerCourse.com  Fact Sheet for Healthcare Providers: SeriousBroker.it  This test is not yet approved or cleared by the [place]  FDA and has been authorized for detection  and/or diagnosis of SARS-CoV-2 by FDA under an Emergency Use Authorization (EUA). This EUA will remain in effect (meaning this test can be used) for the duration of the COVID-19 declaration under Section 564(b)(1) of the Act, 21 U.S.C. section 360bbb-3(b)(1), unless the authorization is terminated or revoked.     Resp Syncytial Virus by PCR NEGATIVE NEGATIVE Final    Comment: (NOTE) Fact Sheet for Patients: BloggerCourse.com  Fact Sheet for Healthcare Providers: SeriousBroker.it  This test is not yet approved or cleared by the [place]  FDA and has been authorized for detection and/or diagnosis of SARS-CoV-2 by FDA under an Emergency Use Authorization (EUA). This EUA will remain in effect (meaning this test can be used) for the duration of the COVID-19 declaration under Section 564(b)(1) of the Act, 21 U.S.C. section 360bbb-3(b)(1), unless the authorization is terminated or revoked.  Performed at [facility], [address] [place], [place] [address]      Radiology Studies: No results found.    Scheduled Meds:  heparin  injection (subcutaneous)  5,000 Units Subcutaneous Q8H   metoprolol  tartrate  25 mg Oral BID   pantoprazole   20 mg Oral Daily   polyethylene glycol  17 g Oral Daily   sodium zirconium cyclosilicate   5 g Oral BID   tamsulosin   0.4 mg Oral Daily   Continuous Infusions:   LOS: 8 days    Time spent:    Harlene Bowl DO [facility]   08/28/2024, 10:47 AM

## 2024-08-28 NOTE — TOC Progression Note (Addendum)
 Transition of Care [facility]) - Progression Note    Patient Details  Name: Scott [name] MRN: [id number] Date of Birth: Oct 22, 1928  [facility] CM/SW Contact  [name], [place] Phone Number: 08/28/2024, 11:54 AM  Clinical Narrative:   CSW followed up with the following facilities that have a considering status due to patient having COVID: [facility], [facility], and [facility] (only accepted status). CSW awaiting response from each facility on if they can formally offer a bed to patient as he is nearing the end of his 10 day COVID isolation.   12:07 PM Per [facility], they can accept pending bed availability, they currently have a private room  Per [facility], they can accept patient as early as 9/26  Per [facility], will inform CSW  if they can accept.  CSW has not heard back from [facility] at this time.   12:52 PM Per [facility], they are able to extend a bed offer.   12:58 PM CSW emailed [name] updated list of official acceptances. CSW called [name] to inform her of the email. Per Palliative, plan is for patient to go to SNF with OP Palliative follow up. Per Pall[facility], [name] has questions about insurance process. When CSW spoke with [name], CSW addressed insurance coverage question. CSW informed [name] that insurance typically covers 14-20 days at 100% for STR. [name] stated she will review the list of facilities and let CSW know of families choice.   1:44 PM [name] chose [facility]. CSW informed [name] that CSW is awaiting to hear back on if [facility] can formally offer a bed. CSW inquired if [name] has a second choice for SNF. CSW awaiting [facility]'s answer.   2:38 PM [facility] officially offered a STR bed for patient. Per [facility], the earliest they can accept patient is 9/26. CSW communicated acceptance to [facility]. [name] inquired about outpatient Palliative services following up. CSW informed [name] that CSW will notify facility of patients Palliative needs and find out which agency they contract with.   Per [facility], they  contract with [facility]. CSW initiated referral. CSW informed [facility] that patient will need BIPAP while at [facility]. SNF to order bipap.   3:36 PM CSW reached out to [facility] for insurance auth assistance.   4:10 PM CMA informed CSW that [facility] has been approved. Cert # [id number].  CSW will continue to follow.    Expected Discharge Plan: Skilled Nursing Facility Barriers to Discharge: Continued Medical Work up, SNF Pending bed offer, Insurance Authorization               Expected Discharge Plan and Services In-house Referral: Clinical Social Work     Living arrangements for the past 2 months: Apartment                                       Social Drivers of Health (SDOH) Interventions SDOH Screenings   Food Insecurity: Patient Unable To Answer (08/21/2024)  Housing: Unknown (08/21/2024)  Transportation Needs: Patient Unable To Answer (08/21/2024)  Utilities: Not At Risk (05/26/2024)  Social Connections: Unknown (08/21/2024)  Tobacco Use: Low Risk  (08/20/2024)    Readmission Risk Interventions    08/21/2024    9:52 AM  Readmission Risk Prevention Plan  Transportation Screening Complete  PCP or Specialist Appt within 5-7 Days Complete  Home Care Screening Complete  Medication Review (RN CM) Complete

## 2024-08-28 NOTE — Plan of Care (Signed)
  Problem: Clinical Measurements: Goal: Ability to maintain clinical measurements within normal limits will improve Outcome: Progressing Goal: Diagnostic test results will improve Outcome: Progressing Goal: Respiratory complications will improve Outcome: Progressing Goal: Cardiovascular complication will be avoided Outcome: Progressing   Problem: Activity: Goal: Risk for activity intolerance will decrease Outcome: Progressing   Problem: Nutrition: Goal: Adequate nutrition will be maintained Outcome: Progressing   Problem: Elimination: Goal: Will not experience complications related to bowel motility Outcome: Progressing Goal: Will not experience complications related to urinary retention Outcome: Progressing   Problem: Pain Managment: Goal: General experience of comfort will improve and/or be controlled Outcome: Progressing   Problem: Safety: Goal: Ability to remain free from injury will improve Outcome: Progressing   Problem: Skin Integrity: Goal: Risk for impaired skin integrity will decrease Outcome: Progressing   Problem: Cardiac: Goal: Ability to achieve and maintain adequate cardiopulmonary perfusion will improve Outcome: Progressing

## 2024-08-29 ENCOUNTER — Inpatient Hospital Stay (HOSPITAL_COMMUNITY)

## 2024-08-29 DIAGNOSIS — J90 Pleural effusion, not elsewhere classified: Secondary | ICD-10-CM | POA: Diagnosis not present

## 2024-08-29 DIAGNOSIS — R0600 Dyspnea, unspecified: Secondary | ICD-10-CM | POA: Diagnosis not present

## 2024-08-29 DIAGNOSIS — Z515 Encounter for palliative care: Secondary | ICD-10-CM | POA: Diagnosis not present

## 2024-08-29 DIAGNOSIS — J9601 Acute respiratory failure with hypoxia: Secondary | ICD-10-CM | POA: Diagnosis not present

## 2024-08-29 DIAGNOSIS — J9811 Atelectasis: Secondary | ICD-10-CM | POA: Diagnosis not present

## 2024-08-29 DIAGNOSIS — I700 Atherosclerosis of aorta: Secondary | ICD-10-CM | POA: Diagnosis not present

## 2024-08-29 DIAGNOSIS — I509 Heart failure, unspecified: Secondary | ICD-10-CM | POA: Diagnosis not present

## 2024-08-29 DIAGNOSIS — I5031 Acute diastolic (congestive) heart failure: Secondary | ICD-10-CM

## 2024-08-29 LAB — BASIC METABOLIC PANEL WITH GFR
Anion gap: 12 (ref 5–15)
BUN: 85 mg/dL — ABNORMAL HIGH (ref 8–23)
CO2: 34 mmol/L — ABNORMAL HIGH (ref 22–32)
Calcium: 9.1 mg/dL (ref 8.9–10.3)
Chloride: 95 mmol/L — ABNORMAL LOW (ref 98–111)
Creatinine, Ser: 1.68 mg/dL — ABNORMAL HIGH (ref 0.61–1.24)
GFR, Estimated: 37 mL/min — ABNORMAL LOW (ref >60)
Glucose, Bld: 148 mg/dL — ABNORMAL HIGH (ref 70–99)
Potassium: 5.2 mmol/L — ABNORMAL HIGH (ref 3.5–5.1)
Sodium: 141 mmol/L (ref 135–145)

## 2024-08-29 LAB — BLOOD GAS, ARTERIAL
Acid-Base Excess: 12 mmol/L — ABNORMAL HIGH (ref 0.0–2.0)
Bicarbonate: 39.6 mmol/L — ABNORMAL HIGH (ref 20.0–28.0)
Drawn by: 37730
O2 Saturation: 99.3 %
Patient temperature: 37.2
pCO2 arterial: 65 mmHg — ABNORMAL HIGH (ref 32–48)
pH, Arterial: 7.4 (ref 7.35–7.45)
pO2, Arterial: 115 mmHg — ABNORMAL HIGH (ref 83–108)

## 2024-08-29 LAB — CBC
HCT: 42.1 % (ref 39.0–52.0)
Hemoglobin: 13.1 g/dL (ref 13.0–17.0)
MCH: 30.1 pg (ref 26.0–34.0)
MCHC: 31.1 g/dL (ref 30.0–36.0)
MCV: 96.8 fL (ref 80.0–100.0)
Platelets: 90 K/uL — ABNORMAL LOW (ref 150–400)
RBC: 4.35 MIL/uL (ref 4.22–5.81)
RDW: 17.4 % — ABNORMAL HIGH (ref 11.5–15.5)
WBC: 13 K/uL — ABNORMAL HIGH (ref 4.0–10.5)
nRBC: 0 % (ref 0.0–0.2)

## 2024-08-29 MED ORDER — TORSEMIDE 20 MG PO TABS
40.0000 mg | ORAL_TABLET | Freq: Every day | ORAL | Status: DC
  Administered 2024-08-30 – 2024-08-31 (×2): 40 mg via ORAL
  Filled 2024-08-29 (×4): qty 2

## 2024-08-29 MED ORDER — SODIUM ZIRCONIUM CYCLOSILICATE 10 G PO PACK
10.0000 g | PACK | Freq: Two times a day (BID) | ORAL | Status: AC
  Filled 2024-08-29: qty 1

## 2024-08-29 MED ORDER — ACETAMINOPHEN 325 MG PO TABS: 650.0000 mg | ORAL_TABLET | Freq: Four times a day (QID) | ORAL | Status: DC | PRN

## 2024-08-29 MED ORDER — ACETAMINOPHEN 650 MG RE SUPP
650.0000 mg | Freq: Once | RECTAL | Status: AC
  Administered 2024-08-29: 650 mg via RECTAL
  Filled 2024-08-29: qty 1

## 2024-08-29 NOTE — Progress Notes (Signed)
 Daily Progress Note   Patient Name: Scott [name]       Date: 08/29/2024 DOB: [birth year]  Age: 89 y.o. MRN#: [id number] Attending Physician: Fairy Frames, MD Primary Care Physician: Nanci Senior, MD Admit Date: 08/20/2024  Reason for Consultation/Follow-up: Establishing goals of care  Subjective: I have reviewed medical records including EPIC notes, MAR, and labs notable for creatinine 1.68 which remains elevated but steadily trending down. Unable to receive report from primary RN.  Went to visit patient at bedside - no family/visitors present. Patient was lying in bed awake and alert. NT is present assisting patient with eating breakfast, which he is accepting and tolerating well. No signs or non-verbal gestures of pain or discomfort noted. No respiratory distress, increased work of breathing, or secretions noted.   9:15 AM Called daughter/Fay - updates provided per assessment this morning. Updated that [facility] will be the organization providing outpatient Palliative Care services. She confirms goal is for patient's discharge to STR in [place]. She is appreciative to know the outpatient PC team will follow for ongoing GOC discussions pending patient's trajectory at rehab.   She understands that patient's SNF reqired quarantine period ends on 9/26 - if patient is medically stable at that time, he will likely be discharged.  All questions and concerns addressed. Encouraged to call with questions and/or concerns. PMT card previously provided.  Length of Stay: 9  Current Medications: Scheduled Meds:   heparin  injection (subcutaneous)  5,000 Units Subcutaneous Q8H   metoprolol  tartrate  25 mg Oral BID   pantoprazole   20 mg Oral Daily   polyethylene glycol  17 g Oral Daily   sodium  zirconium cyclosilicate  10 g Oral BID   tamsulosin   0.4 mg Oral Daily   torsemide   40 mg Oral Daily    Continuous Infusions:   PRN Meds: fluticasone  furoate-vilanterol, ipratropium-albuterol   Physical Exam Vitals and nursing note reviewed.  Constitutional:      General: He is not in acute distress.    Appearance: He is ill-appearing.  Pulmonary:     Effort: No respiratory distress.  Skin:    General: Skin is warm and dry.  Neurological:     Motor: Weakness present.             Vital Signs: BP (!) 181/103 (BP Location: Right Arm)   Pulse  77   Temp 98.5 F (36.9 C) (Oral)   Resp 20   Ht 5' 5 (1.651 m)   Wt 87.7 kg   SpO2 96%   BMI 32.17 kg/m  SpO2: SpO2: 96 % O2 Device: O2 Device: Nasal Cannula O2 Flow Rate: O2 Flow Rate (L/min): 2 L/min  Intake/output summary:  Intake/Output Summary (Last 24 hours) at 08/29/2024 0913 Last data filed at 08/29/2024 0854 Gross per 24 hour  Intake 796 ml  Output 1650 ml  Net -854 ml   LBM: Last BM Date : 08/25/24 Baseline Weight: Weight: 87.4 kg Most recent weight: Weight: 87.7 kg       Palliative Assessment/Data: PPS 50%      Patient Active Problem List   Diagnosis Date Noted   Acute on chronic congestive heart failure (HCC) 08/25/2024   Palliative care encounter 08/25/2024   COVID-19 virus infection 08/21/2024   Acute respiratory failure with hypoxemia (HCC) 08/21/2024   AKI (acute kidney injury) 08/21/2024   Acute metabolic encephalopathy 08/21/2024   Acute CHF (congestive heart failure) (HCC) 08/20/2024   Chronic diastolic CHF (congestive heart failure) (HCC) 05/25/2024   Abnormal ECG 05/25/2024   Syncope and collapse 05/25/2024   Acute on chronic diastolic CHF (congestive heart failure) (HCC) 05/24/2024   Thrombocytopenia    SOB (shortness of breath)    Osteoarthritis    Obesity    Hypertension    H/O echocardiogram    GERD (gastroesophageal reflux disease)    Cranial neuropathy    Generalized OA     Cancer (HCC)    CAD (coronary artery disease)    BPH (benign prostatic hyperplasia)    Anxiety    Anemia    Allergic rhinitis 09/09/2015   Constipation 09/09/2015   Erectile dysfunction due to arterial insufficiency 09/09/2015   Pure hypercholesterolemia 09/09/2015   Osteoarthritis of hip 09/09/2015   Osteoarthritis of knee 09/09/2015   Malignant neoplasm of skin 09/09/2015   Screening for gout 01/06/2015   Inguinal hernia unilateral, non-recurrent, left 03/22/2013   Umbilical hernia, asymptomatic 03/22/2013   DOE (dyspnea on exertion) 05/17/2012   Obstructive sleep apnea 01/06/2008   Insomnia 01/06/2008    Palliative Care Assessment & Plan   Patient Profile: Scott [name] is a 89 y.o. male with multiple medical problems including CAD/CABG, chronic diastolic dysfunction with history of CHF, CKD, COPD, OSA noncompliant with CPAP, who was admitted to the hospital 08/21/2024 with acute hypoxic and hypercapnic respiratory failure. Patient with COVID-positive. Palliative care was consulted to address goals.   Assessment: Principal Problem:   Acute CHF (congestive heart failure) (HCC) Active Problems:   COVID-19 virus infection   Acute respiratory failure with hypoxemia (HCC)   AKI (acute kidney injury)   Acute metabolic encephalopathy   Acute on chronic congestive heart failure (HCC)   Palliative care encounter   Recommendations/Plan: Continue conservative management to treat the treatable Continue DNR-Limited as previously documented - durable DNR form completed and placed in shadow chart. Copy was made and will be scanned into Vynca/ACP tab Goal is for patient's discharge to STR with outpatient Palliative Care to follow. If patient declines or is not finding benefit with rehabilitation efforts, they will likely enroll patient with hospice over rehospitalization PMT will continue to follow peripherally. If there are any imminent needs please call the service directly   Goals  of Care and Additional Recommendations: Limitations on Scope of Treatment: No Tracheostomy  Code Status:    Code Status Orders  (From admission, onward)  Start     Ordered   08/21/24 0455  Do not attempt resuscitation (DNR)- Limited -Do Not Intubate (DNI)  Continuous       Question Answer Comment  If pulseless and not breathing No CPR or chest compressions.   In Pre-Arrest Conditions (Patient Is Breathing and Has A Pulse) Do not intubate. Provide all appropriate non-invasive medical interventions. Avoid ICU transfer unless indicated or required.   Consent: Discussion documented in EHR or advanced directives reviewed      08/21/24 0502           Code Status History     Date Active Date Inactive Code Status Order ID Comments User Context   08/21/2024 0448 08/21/2024 0502 Limited: Do not attempt resuscitation (DNR) -DNR-LIMITED -Do Not Intubate/DNI  [id number]  Alfornia Madison, MD Inpatient   05/25/2024 1541 05/27/2024 1615 Full Code [id number]  Seena Marsa NOVAK, MD Inpatient   04/17/2019 0225 04/18/2019 1912 Full Code [id number]  Hilma Rankins, MD ED   12/11/2012 1502 12/14/2012 1352 Full Code [id number]  Julian Sandor BRAVO, RN Inpatient      Advance Directive Documentation    Flowsheet Row Most Recent Value  Type of Advance Directive Healthcare Power of Attorney, Out of facility DNR (pink MOST or yellow form)  Pre-existing out of facility DNR order (yellow form or pink MOST form) --  MOST Form in Place? --    Prognosis:  Unable to determine  Discharge Planning: Skilled Nursing Facility for rehab with Palliative care service follow-up  Care plan was discussed with primary RN, patient's daughter  Thank you for allowing the Palliative Medicine Team to assist in the care of this patient.   Total Time 35 minutes Prolonged Time Billed  no       Jeoffrey CHRISTELLA Sharps, NP  Please contact Palliative Medicine Team phone at [phone] for questions and concerns.   *Portions  of this note are a verbal dictation therefore any spelling and/or grammatical errors are due to the Dragon Medical One system interpretation.

## 2024-08-29 NOTE — Progress Notes (Signed)
 OT Cancellation Note  Patient Details Name: Scott [name] MRN: [id number] DOB: January 19, 1928   Cancelled Treatment:    Reason Eval/Treat Not Completed: Medical issues which prohibited therapy. Per secure chat with RN, rapid response called on pt this afternoon; pt now on Bipap. Will follow up at a later date.   Nelida Mandarino C, OT  Acute Rehabilitation Services Office [phone] Secure chat preferred   Adrianne GORMAN Savers 08/29/2024, 2:27 PM

## 2024-08-29 NOTE — Progress Notes (Signed)
 SLP Cancellation Note  Patient Details Name: Scott [name] MRN: [id number] DOB: 1928-10-15   Cancelled treatment:       Reason Eval/Treat Not Completed: Medical issues which prohibited therapy. Rapid response called earlier today due to patient being lethargic, tachypneic, not following commands. He was placed on BiPAP. SLP will follow for readiness.

## 2024-08-29 NOTE — Progress Notes (Addendum)
 PROGRESS NOTE    Scott [name]  FMW:[id number] DOB: [birth year] DOA: 08/20/2024 PCP: Nanci Senior, MD    89/M with history of CAD/CABG, chronic diastolic CHF, CKD, COPD, OSA noncompliant with CPAP, presented to the ED with worsening swelling, weight gain, confusion.  In the emergency room noted to be hypoxic 72%, ABG noted severe hypercarbia, placed on BiPAP and diuretics, labs with creatinine of 2.7, troponin 83, proBNP 14090, SARS COVID PCR positive, CT chest with bilateral pleural effusions. - 9/16, persistent hypercarbia and encephalopathy, remained on BiPAP -9/17 onwards improving but w/ delirium and then AKI, diuretics held   Subjective: - Feels fair, no events overnight, mittens on  Assessment and Plan:  Acute on chronic hypoxic and hypercarbic respiratory failure Acute metabolic encephalopathy -Multifactorial, primarily CHF exacerbation, underlying OSA OHS with noncompliance to CPAP also contributing -repeat echo with preserved EF, moderately reduced RV, severely elevated PASP -continue metoprolol , restart diuretics-torsemide  today -needs 10days post COvid for SNF -also check SLP eval concern for dysphagia  Acute on chronic diastolic CHF RV failure, pulmonary hypertension -Echo and diuretics as noted above, conservative management   SARS COVID-19 positive - I doubt this is contributing to his symptoms, CT findings primarily with pleural effusions - completed course of steroids  Elevated troponin Secondary to demand ischemia, no ACS  Hospital delirium  AKI on CKD 3 A Hyperkalemia - Cardiorenal, improving with diuresis, creatinine 2.7 on admission from baseline of 1.2  -worsened to 3.4, now improving  -repeat lokelma   Abnormal LFTs - Secondary to hepatic congestion from decompensated CHF, monitor  COPD - Continue nebs  Thrombocytopenia   DVT prophylaxis: SCDs-thrombocytopenia Code Status: DNR Family Communication: No family at bedside, called and  updated daughter yesterday Disposition Plan: To be determined  Consultants:    Procedures:   Antimicrobials:    Objective: Vitals:   08/28/24 2037 08/29/24 0429 08/29/24 0434 08/29/24 0435  BP: 131/87 (!) 168/108    Pulse: 68 65 76   Resp:  19 20   Temp:  98.2 F (36.8 C)    TempSrc:  Oral    SpO2:  (!) 88% 95%   Weight:    87.7 kg  Height:        Intake/Output Summary (Last 24 hours) at 08/29/2024 0755 Last data filed at 08/29/2024 0437 Gross per 24 hour  Intake 676 ml  Output 1650 ml  Net -974 ml   Filed Weights   08/25/24 0427 08/26/24 0401 08/29/24 0435  Weight: 87.3 kg 87.6 kg 87.7 kg    Examination:  General exam: Fairly frail chronically ill, awake and alert today, oriented to self and partly to place HEENT: no JVD Respiratory system: Decreased breath sounds at the bases, worse on the left Cardiovascular system: S1 & S2 heard, RRR.  Skin: No rashes Psychiatry: flat affect    Data Reviewed:   CBC: Recent Labs  Lab 08/24/24 0223 08/25/24 0234 08/26/24 0206 08/28/24 0217 08/29/24 0337  WBC 10.7* 9.9 7.0 9.7 13.0*  HGB 12.0* 12.1* 11.6* 11.7* 13.1  HCT 39.1 39.2 37.4* 38.6* 42.1  MCV 97.0 97.0 97.1 98.7 96.8  PLT 110* 96* 82* 81* 90*   Basic Metabolic Panel: Recent Labs  Lab 08/25/24 0234 08/26/24 0206 08/27/24 0248 08/28/24 0217 08/29/24 0337  NA 135 136 136 137 141  K 5.0 5.1 5.2* 5.3* 5.2*  CL 92* 96* 92* 93* 95*  CO2 30 33* 36* 35* 34*  GLUCOSE 99 125* 154* 111* 148*  BUN 89* 94* 97* 102* 85*  CREATININE 2.61* 2.15* 1.91* 1.89* 1.68*  CALCIUM 8.9 8.5* 8.5* 8.6* 9.1   GFR: Estimated Creatinine Clearance: 27.4 mL/min (A) (by C-G formula based on SCr of 1.68 mg/dL (H)). Liver Function Tests: No results for input(s): AST, ALT, ALKPHOS, BILITOT, PROT, ALBUMIN in the last 168 hours.  No results for input(s): LIPASE, AMYLASE in the last 168 hours. No results for input(s): AMMONIA in the last 168 hours. Coagulation  Profile: No results for input(s): INR, PROTIME in the last 168 hours. Cardiac Enzymes: No results for input(s): CKTOTAL, CKMB, CKMBINDEX, TROPONINI in the last 168 hours. BNP (last 3 results) Recent Labs    05/24/24 1702 08/20/24 1526  PROBNP 2,422.0* 14,090.0*   HbA1C: No results for input(s): HGBA1C in the last 72 hours. CBG: No results for input(s): GLUCAP in the last 168 hours.  Lipid Profile: No results for input(s): CHOL, HDL, LDLCALC, TRIG, CHOLHDL, LDLDIRECT in the last 72 hours. Thyroid  Function Tests: No results for input(s): TSH, T4TOTAL, FREET4, T3FREE, THYROIDAB in the last 72 hours. Anemia Panel: No results for input(s): VITAMINB12, FOLATE, FERRITIN, TIBC, IRON, RETICCTPCT in the last 72 hours.  Urine analysis:    Component Value Date/Time   COLORURINE YELLOW 08/24/2024 1901   APPEARANCEUR HAZY (A) 08/24/2024 1901   APPEARANCEUR Clear 09/28/2023 0912   LABSPEC 1.016 08/24/2024 1901   PHURINE 5.0 08/24/2024 1901   GLUCOSEU NEGATIVE 08/24/2024 1901   HGBUR NEGATIVE 08/24/2024 1901   BILIRUBINUR NEGATIVE 08/24/2024 1901   BILIRUBINUR Negative 09/28/2023 0912   KETONESUR NEGATIVE 08/24/2024 1901   PROTEINUR 30 (A) 08/24/2024 1901   NITRITE NEGATIVE 08/24/2024 1901   LEUKOCYTESUR NEGATIVE 08/24/2024 1901   Sepsis Labs: @LABRCNTIP (procalcitonin:4,lacticidven:4)  ) Recent Results (from the past 240 hours)  Resp panel by RT-PCR (RSV, Flu A&B, Covid) Anterior Nasal Swab     Status: Abnormal   Collection Time: 08/20/24  3:26 PM   Specimen: Anterior Nasal Swab  Result Value Ref Range Status   SARS Coronavirus 2 by RT PCR POSITIVE (A) NEGATIVE Final    Comment: (NOTE) SARS-CoV-2 target nucleic acids are DETECTED.  The SARS-CoV-2 RNA is generally detectable in upper respiratory specimens during the acute phase of infection. Positive results are indicative of the presence of the identified virus, but do not  rule out bacterial infection or co-infection with other pathogens not detected by the test. Clinical correlation with patient history and other diagnostic information is necessary to determine patient infection status. The expected result is Negative.  Fact Sheet for Patients: BloggerCourse.com  Fact Sheet for Healthcare Providers: SeriousBroker.it  This test is not yet approved or cleared by the [place]  FDA and  has been authorized for detection and/or diagnosis of SARS-CoV-2 by FDA under an Emergency Use Authorization (EUA).  This EUA will remain in effect (meaning this test can be used) for the duration of  the COVID-19 declaration under Section 564(b)(1) of the A ct, 21 U.S.C. section 360bbb-3(b)(1), unless the authorization is terminated or revoked sooner.     Influenza A by PCR NEGATIVE NEGATIVE Final   Influenza B by PCR NEGATIVE NEGATIVE Final    Comment: (NOTE) The Xpert Xpress SARS-CoV-2/FLU/RSV plus assay is intended as an aid in the diagnosis of influenza from Nasopharyngeal swab specimens and should not be used as a sole basis for treatment. Nasal washings and aspirates are unacceptable for Xpert Xpress SARS-CoV-2/FLU/RSV testing.  Fact Sheet for Patients: BloggerCourse.com  Fact Sheet for Healthcare Providers: SeriousBroker.it  This test is not yet approved or cleared  by the [place]  FDA and has been authorized for detection and/or diagnosis of SARS-CoV-2 by FDA under an Emergency Use Authorization (EUA). This EUA will remain in effect (meaning this test can be used) for the duration of the COVID-19 declaration under Section 564(b)(1) of the Act, 21 U.S.C. section 360bbb-3(b)(1), unless the authorization is terminated or revoked.     Resp Syncytial Virus by PCR NEGATIVE NEGATIVE Final    Comment: (NOTE) Fact Sheet for  Patients: BloggerCourse.com  Fact Sheet for Healthcare Providers: SeriousBroker.it  This test is not yet approved or cleared by the [place]  FDA and has been authorized for detection and/or diagnosis of SARS-CoV-2 by FDA under an Emergency Use Authorization (EUA). This EUA will remain in effect (meaning this test can be used) for the duration of the COVID-19 declaration under Section 564(b)(1) of the Act, 21 U.S.C. section 360bbb-3(b)(1), unless the authorization is terminated or revoked.  Performed at [facility], [address] [place], [place] [address]      Radiology Studies: No results found.    Scheduled Meds:  heparin  injection (subcutaneous)  5,000 Units Subcutaneous Q8H   metoprolol  tartrate  25 mg Oral BID   pantoprazole   20 mg Oral Daily   polyethylene glycol  17 g Oral Daily   sodium zirconium cyclosilicate   5 g Oral BID   tamsulosin   0.4 mg Oral Daily   Continuous Infusions:   LOS: 9 days    Time spent:    Sigurd Pac, MD [facility]   08/29/2024, 7:55 AM

## 2024-08-29 NOTE — Progress Notes (Addendum)
 Notified on call provider of BP 162/137, HR 64 and that PO meds are being held for aspiration risk and patient is on bipap.  New orders received.  Will notify MD if HR is >100.

## 2024-08-29 NOTE — Significant Event (Signed)
 Rapid Response Event Note   Reason for Call :  Called by RT: lethargic, tachypneic, congestion, not following commands  Initial Focused Assessment:  Patient briefly opens eyes to voice, alert to self and follows commands with minor delay. Able to recognize daughter and granddaughter by name at bedside. Denies pain but endorses it is hard to get a breath when questioned. Denies palpitations, flutters, chest pain. Skin warm and dry. Lungs diminished, heart tones normal.   181/80 (105) HR 63 RR 25-30 O2 92% 2L  Interventions/Plan of Care:  Congested cough, not productive: RT able to suction successfully--Lungs sounding better ABG, pending Bipap placed PIV started   Event Summary:  MD Notified: DOROTHA Frames MD Call Time: 8793 Arrival Time:  End Time: 8749  Tonna Chiquita POUR, RN

## 2024-08-29 NOTE — Progress Notes (Signed)
 PT Cancellation Note  Patient Details Name: Scott [name] MRN: [id number] DOB: 10-25-1928   Cancelled Treatment:    Reason Eval/Treat Not Completed: (P) Patient not medically ready. Rapid response called earlier this afternoon for pt being lethargic, tachypneic, congestion, and not following commands. RN reports pt is now on Bi-PAP, requesting therapy hold off at this time and re-attempt another day as able.   Theo Ferretti, PT, DPT Acute Rehabilitation Services  Office: [phone]    Theo CHRISTELLA Ferretti 08/29/2024, 2:22 PM

## 2024-08-30 DIAGNOSIS — I509 Heart failure, unspecified: Secondary | ICD-10-CM | POA: Diagnosis not present

## 2024-08-30 DIAGNOSIS — Z7189 Other specified counseling: Secondary | ICD-10-CM

## 2024-08-30 DIAGNOSIS — Z66 Do not resuscitate: Secondary | ICD-10-CM

## 2024-08-30 DIAGNOSIS — J9601 Acute respiratory failure with hypoxia: Secondary | ICD-10-CM | POA: Diagnosis not present

## 2024-08-30 DIAGNOSIS — U071 COVID-19: Secondary | ICD-10-CM | POA: Diagnosis not present

## 2024-08-30 DIAGNOSIS — I5031 Acute diastolic (congestive) heart failure: Secondary | ICD-10-CM | POA: Diagnosis not present

## 2024-08-30 DIAGNOSIS — Z789 Other specified health status: Secondary | ICD-10-CM

## 2024-08-30 LAB — CBC
HCT: 39.1 % (ref 39.0–52.0)
Hemoglobin: 11.8 g/dL — ABNORMAL LOW (ref 13.0–17.0)
MCH: 29.9 pg (ref 26.0–34.0)
MCHC: 30.2 g/dL (ref 30.0–36.0)
MCV: 99.2 fL (ref 80.0–100.0)
Platelets: 71 K/uL — ABNORMAL LOW (ref 150–400)
RBC: 3.94 MIL/uL — ABNORMAL LOW (ref 4.22–5.81)
RDW: 17.7 % — ABNORMAL HIGH (ref 11.5–15.5)
WBC: 11.9 K/uL — ABNORMAL HIGH (ref 4.0–10.5)
nRBC: 0 % (ref 0.0–0.2)

## 2024-08-30 LAB — BASIC METABOLIC PANEL WITH GFR
Anion gap: 10 (ref 5–15)
BUN: 68 mg/dL — ABNORMAL HIGH (ref 8–23)
CO2: 36 mmol/L — ABNORMAL HIGH (ref 22–32)
Calcium: 8.7 mg/dL — ABNORMAL LOW (ref 8.9–10.3)
Chloride: 97 mmol/L — ABNORMAL LOW (ref 98–111)
Creatinine, Ser: 1.33 mg/dL — ABNORMAL HIGH (ref 0.61–1.24)
GFR, Estimated: 50 mL/min — ABNORMAL LOW (ref >60)
Glucose, Bld: 111 mg/dL — ABNORMAL HIGH (ref 70–99)
Potassium: 4.4 mmol/L (ref 3.5–5.1)
Sodium: 143 mmol/L (ref 135–145)

## 2024-08-30 NOTE — Evaluation (Signed)
 Clinical/Bedside Swallow Evaluation Patient Details  Name: Scott [name] MRN: [id number] Date of Birth: [birth year]  Today's Date: 08/30/2024 Time: SLP Start Time (ACUTE ONLY): 1333 SLP Stop Time (ACUTE ONLY): 1346 SLP Time Calculation (min) (ACUTE ONLY): 13 min  Past Medical History:  Past Medical History:  Diagnosis Date   Acute bronchitis 07/11/2012   Acute pancreatitis 04/17/2019   Anemia    Acute blood loss anemia secondary to surgery, followed fr/ Hip replacement    Anxiety    pt. reports that he has occas. nervous episodes    BPH (benign prostatic hyperplasia)    CAD (coronary artery disease)    s/p CABG in 2007  CARDILOLOGIST IS DR. P. SWAZILAND   Cancer (HCC)    melanoma - removed L ear - 2010   CHF (congestive heart failure) (HCC)    Cranial neuropathy    resolved    Generalized OA    GERD (gastroesophageal reflux disease)    H/O echocardiogram    last echo, stress test 05/2012- pt. followed by     Hypercholesterolemia    Hypertension    Obesity    Osteoarthritis    End-stage osteoarthritis, right hip   Sleep apnea    with use of CPAP   SOB (shortness of breath)    WITH EXERTION   Thrombocytopenia    possibly due to Lovenox   Past Surgical History:  Past Surgical History:  Procedure Laterality Date   ACHILLES TENDON SURGERY  10/06/2012   Procedure: ACHILLES TENDON REPAIR;  Surgeon: LELON JONETTA Shari Mickey., MD;  Location: [facility] OR;  Service: Orthopedics;  Laterality: Right;  REPAIR RIGHT RUPTURE ACHILLES TENDON PRIMARY OPEN/PERCUTANEOUS, EXCISION PARTIAL BONE TALUS/CANCANEUS, REPAIR PARTIAL EXCISION CALCANEUS   APPENDECTOMY     ARTERY BIOPSY     temporal - L side- wnl, double vision treated /w prednisone - 1991   CALCANEAL OSTEOTOMY  10/06/2012   Procedure: CALCANEAL OSTEOTOMY;  Surgeon: LELON JONETTA Shari Mickey., MD;  Location: [facility] OR;  Service: Orthopedics;  Laterality: Right;   CARDIAC CATHETERIZATION  12/15/2005   NORMAL. EF 60%, angioplasty 1995   carpel tunnel  surgery-bilateral     both hands    CORONARY ARTERY BYPASS GRAFT  2007   LIMA GRAFT TO THE LAD, SAPHENOUS VEIN GRAFT TO THE FIRST OBTUSE MARIGNAL, SAPHENOUS VEIN GRAFT TO THE SECOND OBTUSE MARGINAL AND A SAPHENOUS VEIN GRAFT TO THE ACUTE MARGINAL AND POSTERIOR LATERAL BRANCH   HERNIA REPAIR     I & D EXTREMITY  12/11/2012   Procedure: IRRIGATION AND DEBRIDEMENT EXTREMITY;  Surgeon: Jerona LULLA Sage, MD;  Location: [facility] OR;  Service: Orthopedics;  Laterality: Right;  Irrigation and Debridement achilles, wound closure, apply wound vac, place antibiotic beads   INGUINAL HERNIA REPAIR Left 05/10/2013   Procedure: HERNIA REPAIR INGUINAL ADULT;  Surgeon: Krystal CHRISTELLA Spinner, MD;  Location: [facility];  Service: General;  Laterality: Left;   INSERTION OF MESH Left 05/10/2013   Procedure: INSERTION OF MESH;  Surgeon: Krystal CHRISTELLA Spinner, MD;  Location: [facility];  Service: General;  Laterality: Left;   JOINT REPLACEMENT     knee  11/12   left arthroscopic   TOTAL HIP ARTHROPLASTY     right   HPI:  89 y.o. male adm 08/20/24 for DOE, bil pitting edema, COVID-19 (+), CHF exacerbation and encephalopathy. PMH: CAD, HFpEF, HTN, OSA, BPH    Assessment / Plan / Recommendation  Clinical Impression  RN reports coughing while taking pills last night before he was placed  on BiPAP. He was alert and sitting upright in the chair, now on nasal cannula. He drank large volumes of water without signs clinically concerning for aspiration. He was attentive to orally transiting purees and solids without noted residue. Recommend he resume regular diet with thin liquids. Discussed with family and RN that swallow function may fluctuate depending on mentation and respiratory status and should be monitored closely. SLP will f/u at least briefly in light of this potential. SLP Visit Diagnosis: Dysphagia, unspecified (R13.10)    Aspiration Risk  Mild aspiration risk    Diet Recommendation Regular;Thin liquid    Liquid Administration via:  Cup;Straw Medication Administration: Whole meds with liquid Supervision: Staff to assist with self feeding;Intermittent supervision to cue for compensatory strategies Compensations: Slow rate;Small sips/bites Postural Changes: Seated upright at 90 degrees    Other  Recommendations Oral Care Recommendations: Oral care BID     Assistance Recommended at Discharge    Functional Status Assessment Patient has had a recent decline in their functional status and demonstrates the ability to make significant improvements in function in a reasonable and predictable amount of time.  Frequency and Duration min 2x/week  1 week       Prognosis Prognosis for improved oropharyngeal function: Good Barriers to Reach Goals: Time post onset      Swallow Study   General HPI: 89 y.o. male adm 08/20/24 for DOE, bil pitting edema, COVID-19 (+), CHF exacerbation and encephalopathy. PMH: CAD, HFpEF, HTN, OSA, BPH Type of Study: Bedside Swallow Evaluation Previous Swallow Assessment: none in chart Diet Prior to this Study: Regular;Thin liquids (Level 0) Temperature Spikes Noted: No Respiratory Status: Nasal cannula History of Recent Intubation: No Behavior/Cognition: Alert;Cooperative Oral Cavity Assessment: Within Functional Limits Oral Care Completed by SLP: No Oral Cavity - Dentition: Adequate natural dentition Vision: Functional for self-feeding Self-Feeding Abilities: Needs assist Patient Positioning: Upright in chair Baseline Vocal Quality: Normal Volitional Cough: Strong Volitional Swallow: Able to elicit    Oral/Motor/Sensory Function Overall Oral Motor/Sensory Function: Within functional limits   Ice Chips Ice chips: Not tested   Thin Liquid Thin Liquid: Within functional limits Presentation: Straw    Nectar Thick Nectar Thick Liquid: Not tested   Honey Thick Honey Thick Liquid: Not tested   Puree Puree: Within functional limits Presentation: Spoon   Solid     Solid: Within functional  limits      Damien Blumenthal, M.A., CCC-SLP Speech Language Pathology, Acute Rehabilitation Services  Secure Chat preferred [phone]  08/30/2024,2:08 PM

## 2024-08-30 NOTE — Progress Notes (Addendum)
 PROGRESS NOTE    Scott [name]  FMW:[id number] DOB: [birth year] DOA: 08/20/2024 PCP: Nanci Senior, MD    89/M with history of CAD/CABG, chronic diastolic CHF, CKD, COPD, OSA noncompliant with CPAP, presented to the ED with worsening swelling, weight gain, confusion.  In the emergency room noted to be hypoxic 72%, ABG noted severe hypercarbia, placed on BiPAP and diuretics, labs with creatinine of 2.7, troponin 83, proBNP 14090, SARS COVID PCR positive, CT chest with bilateral pleural effusions. - 9/16, persistent hypercarbia and encephalopathy, remained on BiPAP -9/17 onwards improving but w/ delirium and then AKI, diuretics held   Subjective: - Feels okay, stayed on BiPAP last night, denies dyspnea this morning  Assessment and Plan:  Acute on chronic hypoxic and hypercarbic respiratory failure Acute metabolic encephalopathy -Multifactorial, primarily CHF exacerbation, underlying OSA OHS with noncompliance to CPAP also contributing -repeat echo with preserved EF, moderately reduced RV, severely elevated PASP -continue metoprolol , torsemide  40 mg daily -needs 10days post COvid for SNF -also check SLP eval concern for dysphagia - Emphasized nocturnal BiPAP, discharge planning, SNF tomorrow if stable  Acute on chronic diastolic CHF RV failure, pulmonary hypertension -Echo and diuretics as noted above, conservative management   SARS COVID-19 positive - I doubt this is contributing to his symptoms, CT findings primarily with pleural effusions - completed course of steroids  Elevated troponin Secondary to demand ischemia, no ACS  Hospital delirium  AKI on CKD 3 A Hyperkalemia - Cardiorenal, improving with diuresis, creatinine 2.7 on admission from baseline of 1.2  -worsened to 3.4, now improving   Abnormal LFTs - Secondary to hepatic congestion from decompensated CHF, monitor  COPD - Continue nebs  Thrombocytopenia   DVT prophylaxis: SCDs-thrombocytopenia Code  Status: DNR Family Communication: No family at bedside, will update daughter Disposition Plan: To be determined  Consultants:    Procedures:   Antimicrobials:    Objective: Vitals:   08/30/24 0200 08/30/24 0300 08/30/24 0400 08/30/24 0716  BP: (!) 104/47 (!) 121/51 (!) 143/88 (!) 108/57  Pulse: 71 (!) 57 71 66  Resp: (!) 21 17 (!) 21 20  Temp:   98.9 F (37.2 C) 99.1 F (37.3 C)  TempSrc:   Axillary Axillary  SpO2: 100% 100% 96% 100%  Weight:      Height:        Intake/Output Summary (Last 24 hours) at 08/30/2024 1131 Last data filed at 08/30/2024 0400 Gross per 24 hour  Intake 0 ml  Output 1300 ml  Net -1300 ml   Filed Weights   08/25/24 0427 08/26/24 0401 08/29/24 0435  Weight: 87.3 kg 87.6 kg 87.7 kg    Examination:  General exam: Fairly frail chronically ill, awake and alert today, oriented to self and partly to place HEENT: Neck obese unable to assess JVD Respiratory system: Decreased breath sounds at the bases, worse on the left Cardiovascular system: S1 & S2 heard, RRR.  Skin: No rashes Psychiatry: flat affect    Data Reviewed:   CBC: Recent Labs  Lab 08/25/24 0234 08/26/24 0206 08/28/24 0217 08/29/24 0337 08/30/24 0144  WBC 9.9 7.0 9.7 13.0* 11.9*  HGB 12.1* 11.6* 11.7* 13.1 11.8*  HCT 39.2 37.4* 38.6* 42.1 39.1  MCV 97.0 97.1 98.7 96.8 99.2  PLT 96* 82* 81* 90* 71*   Basic Metabolic Panel: Recent Labs  Lab 08/26/24 0206 08/27/24 0248 08/28/24 0217 08/29/24 0337 08/30/24 0144  NA 136 136 137 141 143  K 5.1 5.2* 5.3* 5.2* 4.4  CL 96* 92* 93* 95* 97*  CO2 33* 36* 35* 34* 36*  GLUCOSE 125* 154* 111* 148* 111*  BUN 94* 97* 102* 85* 68*  CREATININE 2.15* 1.91* 1.89* 1.68* 1.33*  CALCIUM 8.5* 8.5* 8.6* 9.1 8.7*   GFR: Estimated Creatinine Clearance: 34.6 mL/min (A) (by C-G formula based on SCr of 1.33 mg/dL (H)). Liver Function Tests: No results for input(s): AST, ALT, ALKPHOS, BILITOT, PROT, ALBUMIN in the last 168  hours.  No results for input(s): LIPASE, AMYLASE in the last 168 hours. No results for input(s): AMMONIA in the last 168 hours. Coagulation Profile: No results for input(s): INR, PROTIME in the last 168 hours. Cardiac Enzymes: No results for input(s): CKTOTAL, CKMB, CKMBINDEX, TROPONINI in the last 168 hours. BNP (last 3 results) Recent Labs    05/24/24 1702 08/20/24 1526  PROBNP 2,422.0* 14,090.0*   HbA1C: No results for input(s): HGBA1C in the last 72 hours. CBG: No results for input(s): GLUCAP in the last 168 hours.  Lipid Profile: No results for input(s): CHOL, HDL, LDLCALC, TRIG, CHOLHDL, LDLDIRECT in the last 72 hours. Thyroid  Function Tests: No results for input(s): TSH, T4TOTAL, FREET4, T3FREE, THYROIDAB in the last 72 hours. Anemia Panel: No results for input(s): VITAMINB12, FOLATE, FERRITIN, TIBC, IRON, RETICCTPCT in the last 72 hours.  Urine analysis:    Component Value Date/Time   COLORURINE YELLOW 08/24/2024 1901   APPEARANCEUR HAZY (A) 08/24/2024 1901   APPEARANCEUR Clear 09/28/2023 0912   LABSPEC 1.016 08/24/2024 1901   PHURINE 5.0 08/24/2024 1901   GLUCOSEU NEGATIVE 08/24/2024 1901   HGBUR NEGATIVE 08/24/2024 1901   BILIRUBINUR NEGATIVE 08/24/2024 1901   BILIRUBINUR Negative 09/28/2023 0912   KETONESUR NEGATIVE 08/24/2024 1901   PROTEINUR 30 (A) 08/24/2024 1901   NITRITE NEGATIVE 08/24/2024 1901   LEUKOCYTESUR NEGATIVE 08/24/2024 1901   Sepsis Labs: @LABRCNTIP (procalcitonin:4,lacticidven:4)  ) Recent Results (from the past 240 hours)  Resp panel by RT-PCR (RSV, Flu A&B, Covid) Anterior Nasal Swab     Status: Abnormal   Collection Time: 08/20/24  3:26 PM   Specimen: Anterior Nasal Swab  Result Value Ref Range Status   SARS Coronavirus 2 by RT PCR POSITIVE (A) NEGATIVE Final    Comment: (NOTE) SARS-CoV-2 target nucleic acids are DETECTED.  The SARS-CoV-2 RNA is generally detectable in upper  respiratory specimens during the acute phase of infection. Positive results are indicative of the presence of the identified virus, but do not rule out bacterial infection or co-infection with other pathogens not detected by the test. Clinical correlation with patient history and other diagnostic information is necessary to determine patient infection status. The expected result is Negative.  Fact Sheet for Patients: BloggerCourse.com  Fact Sheet for Healthcare Providers: SeriousBroker.it  This test is not yet approved or cleared by the [place]  FDA and  has been authorized for detection and/or diagnosis of SARS-CoV-2 by FDA under an Emergency Use Authorization (EUA).  This EUA will remain in effect (meaning this test can be used) for the duration of  the COVID-19 declaration under Section 564(b)(1) of the A ct, 21 U.S.C. section 360bbb-3(b)(1), unless the authorization is terminated or revoked sooner.     Influenza A by PCR NEGATIVE NEGATIVE Final   Influenza B by PCR NEGATIVE NEGATIVE Final    Comment: (NOTE) The Xpert Xpress SARS-CoV-2/FLU/RSV plus assay is intended as an aid in the diagnosis of influenza from Nasopharyngeal swab specimens and should not be used as a sole basis for treatment. Nasal washings and aspirates are unacceptable for Xpert Xpress SARS-CoV-2/FLU/RSV testing.  Fact Sheet for Patients: BloggerCourse.com  Fact Sheet for Healthcare Providers: SeriousBroker.it  This test is not yet approved or cleared by the [place]  FDA and has been authorized for detection and/or diagnosis of SARS-CoV-2 by FDA under an Emergency Use Authorization (EUA). This EUA will remain in effect (meaning this test can be used) for the duration of the COVID-19 declaration under Section 564(b)(1) of the Act, 21 U.S.C. section 360bbb-3(b)(1), unless the authorization is  terminated or revoked.     Resp Syncytial Virus by PCR NEGATIVE NEGATIVE Final    Comment: (NOTE) Fact Sheet for Patients: BloggerCourse.com  Fact Sheet for Healthcare Providers: SeriousBroker.it  This test is not yet approved or cleared by the [place]  FDA and has been authorized for detection and/or diagnosis of SARS-CoV-2 by FDA under an Emergency Use Authorization (EUA). This EUA will remain in effect (meaning this test can be used) for the duration of the COVID-19 declaration under Section 564(b)(1) of the Act, 21 U.S.C. section 360bbb-3(b)(1), unless the authorization is terminated or revoked.  Performed at [facility], [address], [place], [place] [address]      Radiology Studies: DG CHEST PORT 1 VIEW Result Date: 08/29/2024 EXAM: 1 VIEW(S) XRAY OF THE CHEST 08/29/2024 11:24:00 AM COMPARISON: 08/20/2024 CLINICAL HISTORY: Dyspnea FINDINGS: LUNGS AND PLEURA: New linear atelectasis is present at the left base. Persistent blunting of bilateral costophrenic angles. No pulmonary edema. No pneumothorax. HEART AND MEDIASTINUM: Heart size at upper limits. CABG markers noted. Aortic atherosclerosis. BONES AND SOFT TISSUES: Sternotomy wires noted. No acute osseous abnormality. IMPRESSION: 1. New linear atelectasis at the left lung base. 2. Persistent small bilateral pleural effusions. Electronically signed by: Lonni Necessary MD 08/29/2024 12:08 PM EDT RP Workstation: [id number]      Scheduled Meds:  heparin  injection (subcutaneous)  5,000 Units Subcutaneous Q8H   metoprolol  tartrate  25 mg Oral BID   pantoprazole   20 mg Oral Daily   polyethylene glycol  17 g Oral Daily   tamsulosin   0.4 mg Oral Daily   torsemide   40 mg Oral Daily   Continuous Infusions:   LOS: 10 days    Time spent:    Sigurd Pac, MD [facility]   08/30/2024, 11:31 AM

## 2024-08-30 NOTE — Progress Notes (Signed)
 Physical Therapy Treatment Patient Details Name: Scott [name] MRN: [id number] DOB: Jan 24, 1928 Today's Date: 08/30/2024   History of Present Illness 89 y.o. male adm 08/20/24 for DOE, bil pitting edema, COVID-19 (+), CHF exacerbation and encephalopathy. PMH: CAD, HFpEF, HTN, OSA, BPH    PT Comments  Pt pleasant, flat affect but with increased orientation this session. Pt with decreased clarity of speech compared to prior sessions and able to judge activity tolerance. Pt educated for seated HEp and mobilizing well on 2L. Pt with bil mittens and needing cues to expectorate secretions. Will continue to follow with continued inpatient follow up therapy, <3 hours/day.   HR 82 SpO2 100% on 2L    If plan is discharge home, recommend the following: A little help with walking and/or transfers;A little help with bathing/dressing/bathroom;Assistance with cooking/housework;Direct supervision/assist for financial management;Supervision due to cognitive status;Assist for transportation;Help with stairs or ramp for entrance   Can travel by private vehicle     Yes  Equipment Recommendations  None recommended by PT    Recommendations for Other Services       Precautions / Restrictions Precautions Precautions: Fall;Other (comment) Recall of Precautions/Restrictions: Impaired Precaution/Restrictions Comments: watch SPO2     Mobility  Bed Mobility Overal bed mobility: Needs Assistance Bed Mobility: Supine to Sit     Supine to sit: HOB elevated, Used rails, Min assist     General bed mobility comments: HOB 30 degrees, use of rail, mod cues for sequence with min assist of pad to fully rotate pelvis to EOB    Transfers Overall transfer level: Needs assistance   Transfers: Sit to/from Stand Sit to Stand: Contact guard assist           General transfer comment: CGA for safety and lines with cues for hand placement    Ambulation/Gait Ambulation/Gait assistance: Contact guard  assist Gait Distance (Feet): 75 Feet Assistive device: Rolling walker (2 wheels) Gait Pattern/deviations: Step-through pattern, Decreased stride length, Trunk flexed   Gait velocity interpretation: <1.8 ft/sec, indicate of risk for recurrent falls   General Gait Details: cues for posture and proximity to RW. Pt able to make laps in room without LOB or fatigue as noted last session. On 2L with SPO2 >98%   Stairs             Wheelchair Mobility     Tilt Bed    Modified Rankin (Stroke Patients Only)       Balance Overall balance assessment: Needs assistance Sitting-balance support: No upper extremity supported, Feet supported Sitting balance-Leahy Scale: Fair     Standing balance support: Bilateral upper extremity supported, During functional activity, Reliant on assistive device for balance Standing balance-Leahy Scale: Poor Standing balance comment: Reliant on RW                            Communication Communication Communication: Impaired Factors Affecting Communication: Hearing impaired  Cognition Arousal: Alert Behavior During Therapy: Flat affect   PT - Cognitive impairments: Problem solving                       PT - Cognition Comments: pt oriented x 4, increased time to follow commands. Some increased in garbled speech Following commands: Impaired Following commands impaired: Follows one step commands with increased time    Cueing Cueing Techniques: Verbal cues, Gestural cues  Exercises General Exercises - Lower Extremity Long Arc Quad: AROM, Both, Strengthening, Seated, 20 reps Hip  Flexion/Marching: Both, Seated, Strengthening, AAROM, 20 reps    General Comments        Pertinent Vitals/Pain Pain Assessment Pain Assessment: No/denies pain    Home Living                          Prior Function            PT Goals (current goals can now be found in the care plan section) Progress towards PT goals: Progressing  toward goals    Frequency    Min 2X/week      PT Plan      Co-evaluation              AM-PAC PT 6 Clicks Mobility   Outcome Measure  Help needed turning from your back to your side while in a flat bed without using bedrails?: A Little Help needed moving from lying on your back to sitting on the side of a flat bed without using bedrails?: A Little Help needed moving to and from a bed to a chair (including a wheelchair)?: A Little Help needed standing up from a chair using your arms (e.g., wheelchair or bedside chair)?: A Little Help needed to walk in hospital room?: A Little Help needed climbing 3-5 steps with a railing? : A Lot 6 Click Score: 17    End of Session Equipment Utilized During Treatment: Gait belt Activity Tolerance: Patient tolerated treatment well Patient left: in chair;with call bell/phone within reach;with chair alarm set;Other (comment) (bil mittens) Nurse Communication: Mobility status PT Visit Diagnosis: Other abnormalities of gait and mobility (R26.89);Difficulty in walking, not elsewhere classified (R26.2);Muscle weakness (generalized) (M62.81)     Time: 8777-8751 PT Time Calculation (min) (ACUTE ONLY): 26 min  Charges:    $Gait Training: 8-22 mins $Therapeutic Activity: 8-22 mins PT General Charges $$ ACUTE PT VISIT: 1 Visit                     Lenoard SQUIBB, PT Acute Rehabilitation Services Office: [phone]    Lenoard NOVAK Tywan Siever 08/30/2024, 1:35 PM

## 2024-08-30 NOTE — TOC Progression Note (Signed)
 Transition of Care [facility]) - Progression Note    Patient Details  Name: Scott [name] MRN: [id number] Date of Birth: September 06, 1928  Transition of Care [facility]) CM/SW Contact  [name], [place] Phone Number: 08/30/2024, 10:27 AM  Clinical Narrative:   Per [facility], the earliest they can take patient after his COVID isolation is 9/26.   CSW will continue to follow.    Expected Discharge Plan: Skilled Nursing Facility Barriers to Discharge: Continued Medical Work up               Expected Discharge Plan and Services In-house Referral: Clinical Social Work     Living arrangements for the past 2 months: Apartment                                       Social Drivers of Health (SDOH) Interventions SDOH Screenings   Food Insecurity: Patient Unable To Answer (08/21/2024)  Housing: Unknown (08/21/2024)  Transportation Needs: Patient Unable To Answer (08/21/2024)  Utilities: Not At Risk (05/26/2024)  Social Connections: Unknown (08/21/2024)  Tobacco Use: Low Risk  (08/20/2024)    Readmission Risk Interventions    08/21/2024    9:52 AM  Readmission Risk Prevention Plan  Transportation Screening Complete  PCP or Specialist Appt within 5-7 Days Complete  Home Care Screening Complete  Medication Review (RN CM) Complete

## 2024-08-31 DIAGNOSIS — G9341 Metabolic encephalopathy: Secondary | ICD-10-CM | POA: Diagnosis not present

## 2024-08-31 DIAGNOSIS — R488 Other symbolic dysfunctions: Secondary | ICD-10-CM | POA: Diagnosis not present

## 2024-08-31 DIAGNOSIS — E785 Hyperlipidemia, unspecified: Secondary | ICD-10-CM | POA: Diagnosis not present

## 2024-08-31 DIAGNOSIS — F4323 Adjustment disorder with mixed anxiety and depressed mood: Secondary | ICD-10-CM | POA: Diagnosis not present

## 2024-08-31 DIAGNOSIS — N179 Acute kidney failure, unspecified: Secondary | ICD-10-CM | POA: Diagnosis not present

## 2024-08-31 DIAGNOSIS — R0902 Hypoxemia: Secondary | ICD-10-CM | POA: Diagnosis not present

## 2024-08-31 DIAGNOSIS — Z7401 Bed confinement status: Secondary | ICD-10-CM | POA: Diagnosis not present

## 2024-08-31 DIAGNOSIS — R2689 Other abnormalities of gait and mobility: Secondary | ICD-10-CM | POA: Diagnosis not present

## 2024-08-31 DIAGNOSIS — I5031 Acute diastolic (congestive) heart failure: Secondary | ICD-10-CM | POA: Diagnosis not present

## 2024-08-31 DIAGNOSIS — I10 Essential (primary) hypertension: Secondary | ICD-10-CM | POA: Diagnosis not present

## 2024-08-31 DIAGNOSIS — J40 Bronchitis, not specified as acute or chronic: Secondary | ICD-10-CM | POA: Diagnosis not present

## 2024-08-31 DIAGNOSIS — R5381 Other malaise: Secondary | ICD-10-CM | POA: Diagnosis not present

## 2024-08-31 DIAGNOSIS — I5032 Chronic diastolic (congestive) heart failure: Secondary | ICD-10-CM | POA: Diagnosis not present

## 2024-08-31 DIAGNOSIS — N400 Enlarged prostate without lower urinary tract symptoms: Secondary | ICD-10-CM | POA: Diagnosis not present

## 2024-08-31 DIAGNOSIS — M6281 Muscle weakness (generalized): Secondary | ICD-10-CM | POA: Diagnosis not present

## 2024-08-31 DIAGNOSIS — K5900 Constipation, unspecified: Secondary | ICD-10-CM | POA: Diagnosis not present

## 2024-08-31 DIAGNOSIS — I2510 Atherosclerotic heart disease of native coronary artery without angina pectoris: Secondary | ICD-10-CM | POA: Diagnosis not present

## 2024-08-31 DIAGNOSIS — J9611 Chronic respiratory failure with hypoxia: Secondary | ICD-10-CM | POA: Diagnosis not present

## 2024-08-31 DIAGNOSIS — M158 Other polyosteoarthritis: Secondary | ICD-10-CM | POA: Diagnosis not present

## 2024-08-31 DIAGNOSIS — N1831 Chronic kidney disease, stage 3a: Secondary | ICD-10-CM | POA: Diagnosis not present

## 2024-08-31 DIAGNOSIS — J9612 Chronic respiratory failure with hypercapnia: Secondary | ICD-10-CM | POA: Diagnosis not present

## 2024-08-31 DIAGNOSIS — J9601 Acute respiratory failure with hypoxia: Secondary | ICD-10-CM | POA: Diagnosis not present

## 2024-08-31 DIAGNOSIS — J99 Respiratory disorders in diseases classified elsewhere: Secondary | ICD-10-CM | POA: Diagnosis not present

## 2024-08-31 DIAGNOSIS — E441 Mild protein-calorie malnutrition: Secondary | ICD-10-CM | POA: Diagnosis not present

## 2024-08-31 DIAGNOSIS — R4189 Other symptoms and signs involving cognitive functions and awareness: Secondary | ICD-10-CM | POA: Diagnosis not present

## 2024-08-31 DIAGNOSIS — R410 Disorientation, unspecified: Secondary | ICD-10-CM | POA: Diagnosis not present

## 2024-08-31 DIAGNOSIS — J449 Chronic obstructive pulmonary disease, unspecified: Secondary | ICD-10-CM | POA: Diagnosis not present

## 2024-08-31 DIAGNOSIS — R0689 Other abnormalities of breathing: Secondary | ICD-10-CM | POA: Diagnosis not present

## 2024-08-31 DIAGNOSIS — M6289 Other specified disorders of muscle: Secondary | ICD-10-CM | POA: Diagnosis not present

## 2024-08-31 DIAGNOSIS — I110 Hypertensive heart disease with heart failure: Secondary | ICD-10-CM | POA: Diagnosis not present

## 2024-08-31 DIAGNOSIS — K219 Gastro-esophageal reflux disease without esophagitis: Secondary | ICD-10-CM | POA: Diagnosis not present

## 2024-08-31 DIAGNOSIS — G629 Polyneuropathy, unspecified: Secondary | ICD-10-CM | POA: Diagnosis not present

## 2024-08-31 DIAGNOSIS — M159 Polyosteoarthritis, unspecified: Secondary | ICD-10-CM | POA: Diagnosis not present

## 2024-08-31 DIAGNOSIS — R1311 Dysphagia, oral phase: Secondary | ICD-10-CM | POA: Diagnosis not present

## 2024-08-31 DIAGNOSIS — I5033 Acute on chronic diastolic (congestive) heart failure: Secondary | ICD-10-CM | POA: Diagnosis not present

## 2024-08-31 DIAGNOSIS — C801 Malignant (primary) neoplasm, unspecified: Secondary | ICD-10-CM | POA: Diagnosis not present

## 2024-08-31 DIAGNOSIS — Z6830 Body mass index (BMI) 30.0-30.9, adult: Secondary | ICD-10-CM | POA: Diagnosis not present

## 2024-08-31 LAB — BASIC METABOLIC PANEL WITH GFR
Anion gap: 8 (ref 5–15)
BUN: 50 mg/dL — ABNORMAL HIGH (ref 8–23)
CO2: 38 mmol/L — ABNORMAL HIGH (ref 22–32)
Calcium: 8.4 mg/dL — ABNORMAL LOW (ref 8.9–10.3)
Chloride: 96 mmol/L — ABNORMAL LOW (ref 98–111)
Creatinine, Ser: 1.16 mg/dL (ref 0.61–1.24)
GFR, Estimated: 58 mL/min — ABNORMAL LOW (ref >60)
Glucose, Bld: 97 mg/dL (ref 70–99)
Potassium: 3.7 mmol/L (ref 3.5–5.1)
Sodium: 142 mmol/L (ref 135–145)

## 2024-08-31 MED ORDER — SPIRONOLACTONE 25 MG PO TABS
25.0000 mg | ORAL_TABLET | Freq: Every day | ORAL | Status: DC
  Administered 2024-08-31: 25 mg via ORAL
  Filled 2024-08-31: qty 1

## 2024-08-31 MED ORDER — SPIRONOLACTONE 25 MG PO TABS: 25.0000 mg | ORAL_TABLET | Freq: Every day | ORAL | Status: DC

## 2024-08-31 MED ORDER — TORSEMIDE 40 MG PO TABS: 40.0000 mg | ORAL_TABLET | Freq: Every day | ORAL | Status: DC

## 2024-08-31 MED ORDER — ACETAMINOPHEN 325 MG PO TABS: 650.0000 mg | ORAL_TABLET | Freq: Four times a day (QID) | ORAL | Status: DC | PRN

## 2024-08-31 NOTE — Progress Notes (Signed)
 Physical Therapy Treatment Patient Details Name: Scott [name] MRN: [id number] DOB: [birth year] Today's Date: 08/31/2024   History of Present Illness 89 y.o. male adm 08/20/24 for DOE, bil pitting edema, COVID-19 (+), CHF exacerbation and encephalopathy. PMH: CAD, HFpEF, HTN, OSA, BPH    PT Comments  Pt very pleasant with flat affect, improving cognition and orientation. Pt demonstrated increased activity tolerance and gait this session with education for transfers and HEP. Assist for pericare. Pt with SPO2 100% on 1L with gait and 100% on RA at rest. Will continue to follow. Patient will benefit from continued inpatient follow up therapy, <3 hours/day     If plan is discharge home, recommend the following: A little help with walking and/or transfers;A little help with bathing/dressing/bathroom;Assistance with cooking/housework;Direct supervision/assist for financial management;Supervision due to cognitive status;Assist for transportation;Help with stairs or ramp for entrance   Can travel by private vehicle     Yes  Equipment Recommendations  None recommended by PT    Recommendations for Other Services       Precautions / Restrictions Precautions Precautions: Fall Recall of Precautions/Restrictions: Impaired     Mobility  Bed Mobility Overal bed mobility: Needs Assistance Bed Mobility: Supine to Sit     Supine to sit: HOB elevated, Used rails, Supervision     General bed mobility comments: HOB 45 degrees, use of rail and pt able to complete pivot to EOB without physical assist, cues for sequence    Transfers Overall transfer level: Needs assistance   Transfers: Sit to/from Stand Sit to Stand: Contact guard assist           General transfer comment: CGA for safety and lines with cues for hand placement. Pt able to rise from bed and complete 5 repeated sit to stands from recliner in 50 sec with reliance on UB support    Ambulation/Gait Ambulation/Gait assistance:  Contact guard assist Gait Distance (Feet): 120 Feet Assistive device: Rolling walker (2 wheels) Gait Pattern/deviations: Step-through pattern, Decreased stride length, Trunk flexed   Gait velocity interpretation: 1.31 - 2.62 ft/sec, indicative of limited community ambulator   General Gait Details: cues for posture and proximity to RW with pt able to self regulate distance on 1L with SPO2 100%   Stairs             Wheelchair Mobility     Tilt Bed    Modified Rankin (Stroke Patients Only)       Balance Overall balance assessment: Needs assistance Sitting-balance support: No upper extremity supported, Feet supported Sitting balance-Leahy Scale: Good     Standing balance support: Bilateral upper extremity supported, During functional activity, Reliant on assistive device for balance Standing balance-Leahy Scale: Poor Standing balance comment: Reliant on RW                            Communication Communication Communication: Impaired Factors Affecting Communication: Hearing impaired  Cognition Arousal: Alert Behavior During Therapy: Flat affect   PT - Cognitive impairments: Problem solving                       PT - Cognition Comments: pt oriented x 4, incontinent of stool in standing and unaware Following commands: Intact Following commands impaired: Only follows one step commands consistently    Cueing Cueing Techniques: Verbal cues  Exercises General Exercises - Lower Extremity Long Arc Quad: AROM, Both, Strengthening, Seated, 20 reps Hip Flexion/Marching: Both, Seated, Strengthening, 20  reps, AAROM    General Comments        Pertinent Vitals/Pain Pain Assessment Pain Assessment: No/denies pain    Home Living                          Prior Function            PT Goals (current goals can now be found in the care plan section) Progress towards PT goals: Progressing toward goals    Frequency    Min  2X/week      PT Plan      Co-evaluation              AM-PAC PT 6 Clicks Mobility   Outcome Measure  Help needed turning from your back to your side while in a flat bed without using bedrails?: A Little Help needed moving from lying on your back to sitting on the side of a flat bed without using bedrails?: A Little Help needed moving to and from a bed to a chair (including a wheelchair)?: A Little Help needed standing up from a chair using your arms (e.g., wheelchair or bedside chair)?: A Little Help needed to walk in hospital room?: A Little Help needed climbing 3-5 steps with a railing? : A Lot 6 Click Score: 17    End of Session Equipment Utilized During Treatment: Gait belt Activity Tolerance: Patient tolerated treatment well Patient left: in chair;with call bell/phone within reach;with chair alarm set Nurse Communication: Mobility status PT Visit Diagnosis: Other abnormalities of gait and mobility (R26.89);Difficulty in walking, not elsewhere classified (R26.2);Muscle weakness (generalized) (M62.81)     Time: 9072-9044 PT Time Calculation (min) (ACUTE ONLY): 28 min  Charges:    $Gait Training: 8-22 mins $Therapeutic Exercise: 8-22 mins PT General Charges $$ ACUTE PT VISIT: 1 Visit                     Lenoard SQUIBB, PT Acute Rehabilitation Services Office: [phone]    Lenoard NOVAK Mahealani Sulak 08/31/2024, 10:34 AM

## 2024-08-31 NOTE — Progress Notes (Signed)
 AVS completed for discharge packet and placed with chart.

## 2024-08-31 NOTE — Discharge Summary (Addendum)
 Physician Discharge Summary  Scott [name] FMW:[id number] DOB: [birth year] DOA: 08/20/2024  PCP: Nanci Senior, MD  Admit date: 08/20/2024 Discharge date: 08/31/2024  Time spent: 45 minutes  Recommendations for Outpatient Follow-up:  SNF for short-term rehab Outpatient palliative care follow-up Emphasized compliance with nightly BiPAP 12/5 [facility] heart care in 1 month   Discharge Diagnoses:  Principal Problem: Acute on chronic hypoxic and hypercarbic respiratory failure Acute on chronic diastolic CHF RV failure Pulmonary hypertension Hospital delirium Cognitive deficits   COVID-19 virus infection   Acute respiratory failure with hypoxemia (HCC)   AKI (acute kidney injury)   Acute metabolic encephalopathy   Acute on chronic congestive heart failure [facility])   Palliative care encounter   Discharge Condition: Improving  Diet recommendation: Low-sodium diet  Filed Weights   08/26/24 0401 08/29/24 0435 08/31/24 0439  Weight: 87.6 kg 87.7 kg 86.8 kg    History of present illness:  89/M with history of CAD/CABG, chronic diastolic CHF, CKD, COPD, OSA noncompliant with CPAP, presented to the ED with worsening swelling, weight gain, confusion.  In the emergency room noted to be hypoxic 72%, ABG noted severe hypercarbia, placed on BiPAP and diuretics, labs with creatinine of 2.7, troponin 83, proBNP 14090, SARS COVID PCR positive, CT chest with bilateral pleural effusions. - 9/16, persistent hypercarbia and encephalopathy, remained on BiPAP -9/17 onwards improving but w/ delirium and then AKI, diuretics held  Hospital Course:   Acute on chronic hypoxic and hypercarbic respiratory failure Acute metabolic encephalopathy -Multifactorial, primarily CHF exacerbation, underlying OSA OHS with noncompliance to CPAP also contributing -repeat echo with preserved EF, moderately reduced RV, severely elevated PASP - Diuresed with IV Lasix , volume status is improved, switch to oral torsemide ,  continue metoprolol , Aldactone   - Emphasized nocturnal BiPAP, plan for SNF today for rehab - Seen by palliative care this admission as well, remains DNR, conservative management with outpatient palliative care follow-up at SNF, recommendations given to family regarding consideration of hospice when he declines further. - Now weaned down to O2 2 L at discharge, consider further wean off as tolerated   Acute on chronic diastolic CHF RV failure, pulmonary hypertension -Echo and diuretics as noted above, conservative management   SARS COVID-19 positive - I doubt this is contributing to his symptoms, CT findings primarily with pleural effusions - completed course of steroids   Elevated troponin Secondary to demand ischemia, no ACS   Hospital delirium -Improving, has cognitive deficits at baseline   AKI on CKD 3 A Hyperkalemia - Cardiorenal, improving with diuresis, creatinine 2.7 on admission from baseline of 1.2  -worsened to 3.4, now improving    Abnormal LFTs - Secondary to hepatic congestion from decompensated CHF, monitor   COPD - Continue nebs   Thrombocytopenia     DVT prophylaxis: SCDs-thrombocytopenia Code Status: DNR  Discharge Exam: Vitals:   08/31/24 0748 08/31/24 0916  BP: 116/67 116/67  Pulse: (!) 58 62  Resp: 17   Temp: 97.7 F (36.5 C)   SpO2: 99%    General exam: Fairly frail chronically ill, awake and alert today, oriented to self and partly to place, mild cognitive deficits HEENT: Neck obese unable to assess JVD Respiratory system: Decreased breath sounds at the bases, worse on the left Cardiovascular system: S1 & S2 heard, RRR.  Skin: No rashes Psychiatry: flat affect    Discharge Instructions    Allergies as of 08/31/2024       Reactions   Acetaminophen  Other (See Comments)    mouth sores  Amlodipine Swelling   Swelling of feet and legs   Bactrim  [sulfamethoxazole -trimethoprim ] Other (See Comments)   Constipation    Codeine Nausea  Only   Doxepin Other (See Comments)   Unknown    Fexofenadine Other (See Comments)   mouth sores   Ibuprofen  Other (See Comments)   nervousness   Lovenox [enoxaparin Sodium] Other (See Comments)   Concern for thrombocytopenia s/p THA with Lovenox admin (2012).  PLTC 140 > 92.  HIT labs sent at that time were negative.   Naproxen Other (See Comments)    mouth sores   Prednisone Swelling   Patient has tolerated doses for colds. Thinks it caused swelling in the past   Shellfish Allergy Nausea And Vomiting, Other (See Comments)   N&V, shaking   Sonata  [zaleplon ] Other (See Comments)   Hyper feelings   Suvorexant  Other (See Comments)   Unknown         Medication List     STOP taking these medications    budesonide-formoterol 160-4.5 MCG/ACT inhaler Commonly known as: SYMBICORT   furosemide  20 MG tablet Commonly known as: LASIX    telmisartan  20 MG tablet Commonly known as: MICARDIS    triazolam  0.25 MG tablet Commonly known as: HALCION        TAKE these medications    acetaminophen  325 MG tablet Commonly known as: TYLENOL  Take 2 tablets (650 mg total) by mouth every 6 (six) hours as needed for mild pain (pain score 1-3).   albuterol  108 (90 Base) MCG/ACT inhaler Commonly known as: VENTOLIN  HFA Inhale 1-2 puffs into the lungs every 6 (six) hours as needed for wheezing or shortness of breath.   bisacodyl  5 MG EC tablet Commonly known as: DULCOLAX Take 10 mg by mouth at bedtime.   Breo Ellipta  100-25 MCG/INH Aepb Generic drug: fluticasone  furoate-vilanterol Inhale 1 puff into the lungs daily as needed (wheezing/SOB).   EPINEPHrine  0.3 mg/0.3 mL Soaj injection Commonly known as: EPI-PEN Inject 0.3 mg into the muscle as needed for anaphylaxis.   lovastatin 20 MG tablet Commonly known as: MEVACOR Take 20 mg by mouth at bedtime.   metoprolol  tartrate 50 MG tablet Commonly known as: LOPRESSOR  Take 25 mg by mouth 2 (two) times daily.   pantoprazole  20 MG  tablet Commonly known as: Protonix  Take 1 tablet (20 mg total) by mouth daily.   spironolactone  25 MG tablet Commonly known as: ALDACTONE  Take 1 tablet (25 mg total) by mouth daily.   tamsulosin  0.4 MG Caps capsule Commonly known as: FLOMAX  Take 1 capsule (0.4 mg total) by mouth at bedtime.   Torsemide  40 MG Tabs Take 40 mg by mouth daily. Start taking on: September 01, 2024       Allergies  Allergen Reactions   Acetaminophen  Other (See Comments)     mouth sores   Amlodipine Swelling    Swelling of feet and legs   Bactrim  [Sulfamethoxazole -Trimethoprim ] Other (See Comments)    Constipation    Codeine Nausea Only   Doxepin Other (See Comments)    Unknown    Fexofenadine Other (See Comments)    mouth sores   Ibuprofen  Other (See Comments)    nervousness   Lovenox [Enoxaparin Sodium] Other (See Comments)    Concern for thrombocytopenia s/p THA with Lovenox admin (2012).  PLTC 140 > 92.  HIT labs sent at that time were negative.   Naproxen Other (See Comments)     mouth sores   Prednisone Swelling    Patient has tolerated doses for colds.  Thinks it caused swelling in the past   Shellfish Allergy Nausea And Vomiting and Other (See Comments)    N&V, shaking   Sonata  [Zaleplon ] Other (See Comments)    Hyper feelings   Suvorexant  Other (See Comments)    Unknown     Contact information for after-discharge care     Destination     [facility] .   Service: Skilled Nursing Contact information: [address] [place]  [address] [phone]                      The results of significant diagnostics from this hospitalization (including imaging, microbiology, ancillary and laboratory) are listed below for reference.    Significant Diagnostic Studies: DG CHEST PORT 1 VIEW Result Date: 08/29/2024 EXAM: 1 VIEW(S) XRAY OF THE CHEST 08/29/2024 11:24:00 AM COMPARISON: 08/20/2024 CLINICAL HISTORY: Dyspnea FINDINGS: LUNGS AND PLEURA: New  linear atelectasis is present at the left base. Persistent blunting of bilateral costophrenic angles. No pulmonary edema. No pneumothorax. HEART AND MEDIASTINUM: Heart size at upper limits. CABG markers noted. Aortic atherosclerosis. BONES AND SOFT TISSUES: Sternotomy wires noted. No acute osseous abnormality. IMPRESSION: 1. New linear atelectasis at the left lung base. 2. Persistent small bilateral pleural effusions. Electronically signed by: Lonni Necessary MD 08/29/2024 12:08 PM EDT RP Workstation: [id number]   ECHOCARDIOGRAM LIMITED Result Date: 08/21/2024    ECHOCARDIOGRAM LIMITED REPORT   Patient Name:   Scott [name] Date of Exam: 08/21/2024 Medical Rec #:  [id number]       Height:       65.0 in Accession #:    [id number]      Weight:       192.7 lb Date of Birth:  29-Jan-1928       BSA:          1.947 m Patient Age:    89 years        BP:           107/45 mmHg Patient Gender: M               HR:           80 bpm. Exam Location:  Inpatient Procedure: Limited Echo, Color Doppler and Cardiac Doppler (Both Spectral and            Color Flow Doppler were utilized during procedure). Indications:    I50.31 Acute diastolic (congestive) heart failure  History:        Patient has prior history of Echocardiogram examinations, most                 recent 05/25/2024. CHF; Risk Factors:Hypertension, Dyslipidemia                 and Sleep Apnea.  Sonographer:    Damien Senior RDCS Referring Phys: [id number] SUBRINA SUNDIL  Sonographer Comments: COVID+ at time of study IMPRESSIONS  1. Left ventricular ejection fraction, by estimation, is 60 to 65%. The left ventricle has normal function.  2. Right ventricular systolic function is moderately reduced. The right ventricular size is moderately enlarged. There is severely elevated pulmonary artery systolic pressure. The estimated right ventricular systolic pressure is 69.2 mmHg.  3. The mitral valve is normal in structure. Mild mitral valve regurgitation.  4. Tricuspid valve  regurgitation is moderate to severe.  5. The aortic valve is tricuspid. Aortic valve regurgitation is not visualized. Aortic valve sclerosis is present, with no evidence of aortic valve stenosis.  6. The inferior  vena cava is dilated in size with <50% respiratory variability, suggesting right atrial pressure of 15 mmHg. FINDINGS  Left Ventricle: Left ventricular ejection fraction, by estimation, is 60 to 65%. The left ventricle has normal function. Right Ventricle: The right ventricular size is moderately enlarged. Right ventricular systolic function is moderately reduced. There is severely elevated pulmonary artery systolic pressure. The tricuspid regurgitant velocity is 3.68 m/s, and with an assumed right atrial pressure of 15 mmHg, the estimated right ventricular systolic pressure is 69.2 mmHg. Mitral Valve: The mitral valve is normal in structure. Mild mitral valve regurgitation. Tricuspid Valve: The tricuspid valve is normal in structure. Tricuspid valve regurgitation is moderate to severe. Aortic Valve: The aortic valve is tricuspid. Aortic valve regurgitation is not visualized. Aortic valve sclerosis is present, with no evidence of aortic valve stenosis. Venous: The inferior vena cava is dilated in size with less than 50% respiratory variability, suggesting right atrial pressure of 15 mmHg. Additional Comments: Spectral Doppler performed. Color Doppler performed.  LEFT VENTRICLE PLAX 2D LVOT diam:     1.90 cm LV SV:         53 LV SV Index:   27 LVOT Area:     2.84 cm  RIGHT VENTRICLE RV S prime:     7.51 cm/s TAPSE (M-mode): 1.1 cm AORTIC VALVE LVOT Vmax:   86.20 cm/s LVOT Vmean:  63.100 cm/s LVOT VTI:    0.188 m TRICUSPID VALVE TR Peak grad:   54.2 mmHg TR Vmax:        368.00 cm/s  SHUNTS Systemic VTI:  0.19 m Systemic Diam: 1.90 cm Aditya Sabharwal Electronically signed by Ria Commander Signature Date/Time: 08/21/2024/4:23:22 PM    Final    CT CHEST WO CONTRAST Result Date: 08/21/2024 CLINICAL DATA:   Respiratory illness. EXAM: CT CHEST WITHOUT CONTRAST TECHNIQUE: Multidetector CT imaging of the chest was performed following the standard protocol without IV contrast. RADIATION DOSE REDUCTION: This exam was performed according to the departmental dose-optimization program which includes automated exposure control, adjustment of the mA and/or kV according to patient size and/or use of iterative reconstruction technique. COMPARISON:  05/24/2024 FINDINGS: Patient was scanned in a right-side-down decubitus position. Cardiovascular: Mild cardiomegaly. No substantial pericardial effusion. Coronary artery calcification is evident. Moderate atherosclerotic calcification is noted in the wall of the thoracic aorta. Status post CABG. Ascending thoracic aorta measures 4.3 cm diameter. Enlargement of the pulmonary outflow tract/main pulmonary arteries suggests pulmonary arterial hypertension. Mediastinum/Nodes: No mediastinal lymphadenopathy. No evidence for gross hilar lymphadenopathy although assessment is limited by the lack of intravenous contrast on the current study. The esophagus has normal imaging features. There is no axillary lymphadenopathy. Lungs/Pleura: Fine detail obscured by substantial breathing motion during image acquisition. Moderate right and small left dependent pleural effusions evident. Dependent atelectasis noted in the lateral right lung. No dense consolidative airspace disease. No suspicious pulmonary nodule or mass. Upper Abdomen: Visualized portion of the upper abdomen shows no acute findings. Musculoskeletal: Body wall edema evident. Degenerative changes are noted in the right shoulder. IMPRESSION: 1. Moderate right and small left dependent pleural effusions with dependent atelectasis in the lateral right lung. 2. 4.3 cm diameter ascending thoracic aorta. Recommend annual imaging followup by CTA or MRA. This recommendation follows 2010 ACCF/AHA/AATS/ACR/ASA/SCA/SCAI/SIR/STS/SVM Guidelines for the  Diagnosis and Management of Patients with Thoracic Aortic Disease. Circulation. 2010; 121: Z733-z630. Aortic aneurysm NOS (ICD10-I71.9) 3. Enlargement of the pulmonary outflow tract/main pulmonary arteries suggests pulmonary arterial hypertension. 4. Mild body wall edema. 5.  Aortic Atherosclerosis (  ICD10-I70.0). Electronically Signed   By: Camellia Candle M.D.   On: 08/21/2024 05:54   CT Head Wo Contrast Result Date: 08/20/2024 CLINICAL DATA:  Altered mental status EXAM: CT HEAD WITHOUT CONTRAST TECHNIQUE: Contiguous axial images were obtained from the base of the skull through the vertex without intravenous contrast. RADIATION DOSE REDUCTION: This exam was performed according to the departmental dose-optimization program which includes automated exposure control, adjustment of the mA and/or kV according to patient size and/or use of iterative reconstruction technique. COMPARISON:  05/24/2024 FINDINGS: Brain: No evidence of acute infarction, hemorrhage, hydrocephalus, extra-axial collection or mass lesion/mass effect. Mild atrophic changes are noted. Mild chronic white matter ischemic change is seen. Vascular: No hyperdense vessel or unexpected calcification. Skull: Normal. Negative for fracture or focal lesion. Sinuses/Orbits: No acute finding. Other: None. IMPRESSION: Chronic atrophic and ischemic changes without acute abnormality. Electronically Signed   By: Oneil Devonshire M.D.   On: 08/20/2024 21:07   DG Chest Portable 1 View Result Date: 08/20/2024 CLINICAL DATA:  Hypoxia EXAM: PORTABLE CHEST 1 VIEW COMPARISON:  None Available. FINDINGS: Normal mediastinum and cardiac silhouette. Midline sternotomy. Normal pulmonary vasculature. No evidence of effusion, infiltrate, or pneumothorax. No acute bony abnormality. IMPRESSION: No acute cardiopulmonary process. Electronically Signed   By: Jackquline Boxer M.D.   On: 08/20/2024 16:11   LONG TERM MONITOR (3-14 DAYS) Result Date: 08/15/2024   Normal sinus rhythm  with first degree AV block   Second degree AV block Mobitz type 1 occuring predominantly in the early am hours   5 beat run of idioventricular rhythm   Otherwise rare PACs and PVCs   Symptoms do not correlate with arrhythmia Patch Wear Time:  13 days and 23 hours (2025-08-18T15:50:02-399 to 2025-09-01T15:49:55-0400) Patient had a min HR of 29 bpm, max HR of 109 bpm, and avg HR of 69 bpm. Predominant underlying rhythm was Sinus Rhythm. First Degree AV Block was present. Second Degree AV Block-Mobitz I (Wenckebach) was present. Isolated SVEs were rare (<1.0%), SVE Couplets were rare (<1.0%), and SVE Triplets were rare (<1.0%). Isolated VEs were rare (<1.0%, 3086), VE Couplets were rare (<1.0%, 52), and VE Triplets were rare (<1.0%, 1). Ventricular Bigeminy and Trigeminy were present.    Microbiology: No results found for this or any previous visit (from the past 240 hours).   Labs: Basic Metabolic Panel: Recent Labs  Lab 08/27/24 0248 08/28/24 0217 08/29/24 0337 08/30/24 0144 08/31/24 0219  NA 136 137 141 143 142  K 5.2* 5.3* 5.2* 4.4 3.7  CL 92* 93* 95* 97* 96*  CO2 36* 35* 34* 36* 38*  GLUCOSE 154* 111* 148* 111* 97  BUN 97* 102* 85* 68* 50*  CREATININE 1.91* 1.89* 1.68* 1.33* 1.16  CALCIUM 8.5* 8.6* 9.1 8.7* 8.4*   Liver Function Tests: No results for input(s): AST, ALT, ALKPHOS, BILITOT, PROT, ALBUMIN in the last 168 hours. No results for input(s): LIPASE, AMYLASE in the last 168 hours. No results for input(s): AMMONIA in the last 168 hours. CBC: Recent Labs  Lab 08/25/24 0234 08/26/24 0206 08/28/24 0217 08/29/24 0337 08/30/24 0144  WBC 9.9 7.0 9.7 13.0* 11.9*  HGB 12.1* 11.6* 11.7* 13.1 11.8*  HCT 39.2 37.4* 38.6* 42.1 39.1  MCV 97.0 97.1 98.7 96.8 99.2  PLT 96* 82* 81* 90* 71*   Cardiac Enzymes: No results for input(s): CKTOTAL, CKMB, CKMBINDEX, TROPONINI in the last 168 hours. BNP: BNP (last 3 results) No results for input(s): BNP in the  last 8760 hours.  ProBNP (last 3  results) Recent Labs    05/24/24 1702 08/20/24 1526  PROBNP 2,422.0* 14,090.0*    CBG: No results for input(s): GLUCAP in the last 168 hours.     Signed:  Sigurd Pac MD.  [facility] 08/31/2024, 9:43 AM

## 2024-08-31 NOTE — TOC Transition Note (Signed)
 Transition of Care [facility]) - Discharge Note   Patient Details  Name: Scott [name] MRN: [id number] Date of Birth: 1928-01-02  [facility] CM/SW Contact:  [name], LCSWA Phone Number: 08/31/2024, 11:54 AM   Clinical Narrative:   Patient will DC to: [facility] Anticipated DC date: 08/31/24  Family notified: [name] (dtr) Transport by: [facility]   Per MD patient ready for DC to [facility]. RN to call report prior to discharge [phone]; room 209-2). RN, patient, patient's family, and facility notified of DC. Discharge Summary and FL2 sent to facility. DC packet on chart. Ambulance transport requested for patient 11:52AM.   CSW will sign off for now as social work intervention is no longer needed. Please consult us  again if new needs arise.   Final next level of care: Skilled Nursing Facility Barriers to Discharge: Barriers Resolved   Patient Goals and CMS Choice Patient states their goals for this hospitalization and ongoing recovery are:: To go to rehab          Discharge Placement              Patient chooses bed at: [facility] Patient to be transferred to facility by: [facility] Name of family member notified: [name] (Daughter)  [phone] Patient and family notified of of transfer: 08/31/24  Discharge Plan and Services Additional resources added to the After Visit Summary for   In-house Referral: Clinical Social Work                                   Social Drivers of Health (SDOH) Interventions SDOH Screenings   Food Insecurity: Patient Unable To Answer (08/21/2024)  Housing: Unknown (08/21/2024)  Transportation Needs: Patient Unable To Answer (08/21/2024)  Utilities: Not At Risk (05/26/2024)  Social Connections: Unknown (08/21/2024)  Tobacco Use: Low Risk  (08/20/2024)     Readmission Risk Interventions    08/21/2024    9:52 AM  Readmission Risk Prevention Plan  Transportation Screening Complete  PCP or Specialist Appt within 5-7  Days Complete  Home Care Screening Complete  Medication Review (RN CM) Complete

## 2024-09-03 DIAGNOSIS — J9612 Chronic respiratory failure with hypercapnia: Secondary | ICD-10-CM | POA: Diagnosis not present

## 2024-09-03 DIAGNOSIS — J449 Chronic obstructive pulmonary disease, unspecified: Secondary | ICD-10-CM | POA: Diagnosis not present

## 2024-09-03 DIAGNOSIS — I5033 Acute on chronic diastolic (congestive) heart failure: Secondary | ICD-10-CM | POA: Diagnosis not present

## 2024-09-03 DIAGNOSIS — N1831 Chronic kidney disease, stage 3a: Secondary | ICD-10-CM | POA: Diagnosis not present

## 2024-09-03 DIAGNOSIS — G9341 Metabolic encephalopathy: Secondary | ICD-10-CM | POA: Diagnosis not present

## 2024-09-03 DIAGNOSIS — N179 Acute kidney failure, unspecified: Secondary | ICD-10-CM | POA: Diagnosis not present

## 2024-09-03 DIAGNOSIS — J9601 Acute respiratory failure with hypoxia: Secondary | ICD-10-CM | POA: Diagnosis not present

## 2024-09-03 DIAGNOSIS — R5381 Other malaise: Secondary | ICD-10-CM | POA: Diagnosis not present

## 2024-09-04 DIAGNOSIS — G9341 Metabolic encephalopathy: Secondary | ICD-10-CM | POA: Diagnosis not present

## 2024-09-04 DIAGNOSIS — I2510 Atherosclerotic heart disease of native coronary artery without angina pectoris: Secondary | ICD-10-CM | POA: Diagnosis not present

## 2024-09-04 DIAGNOSIS — R5381 Other malaise: Secondary | ICD-10-CM | POA: Diagnosis not present

## 2024-09-05 DIAGNOSIS — F4323 Adjustment disorder with mixed anxiety and depressed mood: Secondary | ICD-10-CM | POA: Diagnosis not present

## 2024-09-07 DIAGNOSIS — J449 Chronic obstructive pulmonary disease, unspecified: Secondary | ICD-10-CM | POA: Diagnosis not present

## 2024-09-07 DIAGNOSIS — I2510 Atherosclerotic heart disease of native coronary artery without angina pectoris: Secondary | ICD-10-CM | POA: Diagnosis not present

## 2024-09-07 DIAGNOSIS — N1831 Chronic kidney disease, stage 3a: Secondary | ICD-10-CM | POA: Diagnosis not present

## 2024-09-07 DIAGNOSIS — G9341 Metabolic encephalopathy: Secondary | ICD-10-CM | POA: Diagnosis not present

## 2024-09-07 DIAGNOSIS — R5381 Other malaise: Secondary | ICD-10-CM | POA: Diagnosis not present

## 2024-09-07 DIAGNOSIS — J9612 Chronic respiratory failure with hypercapnia: Secondary | ICD-10-CM | POA: Diagnosis not present

## 2024-09-10 DIAGNOSIS — N1831 Chronic kidney disease, stage 3a: Secondary | ICD-10-CM | POA: Diagnosis not present

## 2024-09-10 DIAGNOSIS — I5032 Chronic diastolic (congestive) heart failure: Secondary | ICD-10-CM | POA: Diagnosis not present

## 2024-09-10 DIAGNOSIS — M158 Other polyosteoarthritis: Secondary | ICD-10-CM | POA: Diagnosis not present

## 2024-09-10 DIAGNOSIS — J9612 Chronic respiratory failure with hypercapnia: Secondary | ICD-10-CM | POA: Diagnosis not present

## 2024-09-10 DIAGNOSIS — J449 Chronic obstructive pulmonary disease, unspecified: Secondary | ICD-10-CM | POA: Diagnosis not present

## 2024-09-10 DIAGNOSIS — R4189 Other symptoms and signs involving cognitive functions and awareness: Secondary | ICD-10-CM | POA: Diagnosis not present

## 2024-09-10 DIAGNOSIS — I2510 Atherosclerotic heart disease of native coronary artery without angina pectoris: Secondary | ICD-10-CM | POA: Diagnosis not present

## 2024-09-10 DIAGNOSIS — J9611 Chronic respiratory failure with hypoxia: Secondary | ICD-10-CM | POA: Diagnosis not present

## 2024-09-10 DIAGNOSIS — R5381 Other malaise: Secondary | ICD-10-CM | POA: Diagnosis not present

## 2024-09-10 DIAGNOSIS — G9341 Metabolic encephalopathy: Secondary | ICD-10-CM | POA: Diagnosis not present

## 2024-09-10 DIAGNOSIS — I110 Hypertensive heart disease with heart failure: Secondary | ICD-10-CM | POA: Diagnosis not present

## 2024-09-12 DIAGNOSIS — F4323 Adjustment disorder with mixed anxiety and depressed mood: Secondary | ICD-10-CM | POA: Diagnosis not present

## 2024-09-14 DIAGNOSIS — R5381 Other malaise: Secondary | ICD-10-CM | POA: Diagnosis not present

## 2024-09-14 DIAGNOSIS — G9341 Metabolic encephalopathy: Secondary | ICD-10-CM | POA: Diagnosis not present

## 2024-09-14 DIAGNOSIS — J449 Chronic obstructive pulmonary disease, unspecified: Secondary | ICD-10-CM | POA: Diagnosis not present

## 2024-09-14 DIAGNOSIS — J9601 Acute respiratory failure with hypoxia: Secondary | ICD-10-CM | POA: Diagnosis not present

## 2024-09-14 DIAGNOSIS — N179 Acute kidney failure, unspecified: Secondary | ICD-10-CM | POA: Diagnosis not present

## 2024-09-14 DIAGNOSIS — N1831 Chronic kidney disease, stage 3a: Secondary | ICD-10-CM | POA: Diagnosis not present

## 2024-09-14 DIAGNOSIS — J9612 Chronic respiratory failure with hypercapnia: Secondary | ICD-10-CM | POA: Diagnosis not present

## 2024-09-14 DIAGNOSIS — I5033 Acute on chronic diastolic (congestive) heart failure: Secondary | ICD-10-CM | POA: Diagnosis not present

## 2024-09-14 DIAGNOSIS — I2510 Atherosclerotic heart disease of native coronary artery without angina pectoris: Secondary | ICD-10-CM | POA: Diagnosis not present

## 2024-10-06 DEATH — deceased

## 2024-10-17 ENCOUNTER — Ambulatory Visit: Admitting: Nurse Practitioner
